# Patient Record
Sex: Male | Born: 1945 | Race: White | Hispanic: No | Marital: Married | Smoking: Former smoker
Health system: Southern US, Community
[De-identification: ages and names within clinical notes are randomized; demographics above are authoritative.]

## PROBLEM LIST (undated history)

## (undated) DIAGNOSIS — C61 Malignant neoplasm of prostate: Secondary | ICD-10-CM

## (undated) DIAGNOSIS — K5730 Diverticulosis of large intestine without perforation or abscess without bleeding: Secondary | ICD-10-CM

## (undated) DIAGNOSIS — Z860100 Personal history of colon polyps, unspecified: Secondary | ICD-10-CM

## (undated) HISTORY — DX: Personal history of colon polyps, unspecified: Z86.0100

## (undated) HISTORY — DX: Diverticulosis of large intestine without perforation or abscess without bleeding: K57.30

## (undated) HISTORY — PX: PORTACATH PLACEMENT: SHX2246

## (undated) HISTORY — DX: Personal history of colonic polyps: Z86.010

---

## 1994-03-26 HISTORY — PX: CARDIOVASCULAR STRESS TEST: SHX262

## 2000-09-23 ENCOUNTER — Encounter (INDEPENDENT_AMBULATORY_CARE_PROVIDER_SITE_OTHER): Payer: Self-pay | Admitting: Specialist

## 2000-09-23 ENCOUNTER — Other Ambulatory Visit: Admission: RE | Admit: 2000-09-23 | Discharge: 2000-09-23 | Payer: Self-pay | Admitting: Gastroenterology

## 2005-04-18 ENCOUNTER — Ambulatory Visit: Payer: Self-pay | Admitting: Internal Medicine

## 2005-08-13 ENCOUNTER — Ambulatory Visit: Payer: Self-pay | Admitting: Gastroenterology

## 2005-08-24 ENCOUNTER — Ambulatory Visit: Payer: Self-pay | Admitting: Gastroenterology

## 2005-08-24 LAB — HM COLONOSCOPY

## 2006-10-08 ENCOUNTER — Encounter: Payer: Self-pay | Admitting: Internal Medicine

## 2006-10-08 DIAGNOSIS — K5730 Diverticulosis of large intestine without perforation or abscess without bleeding: Secondary | ICD-10-CM | POA: Insufficient documentation

## 2006-10-11 ENCOUNTER — Ambulatory Visit: Payer: Self-pay | Admitting: Internal Medicine

## 2006-10-11 LAB — CONVERTED CEMR LAB
Glucose, Bld: 92 mg/dL (ref 70–99)
PSA: 2.5 ng/mL (ref 0.10–4.00)

## 2008-02-12 ENCOUNTER — Ambulatory Visit: Payer: Self-pay | Admitting: Internal Medicine

## 2008-02-13 LAB — CONVERTED CEMR LAB: PSA: 2.12 ng/mL (ref 0.10–4.00)

## 2009-05-06 ENCOUNTER — Ambulatory Visit: Payer: Self-pay | Admitting: Internal Medicine

## 2009-05-11 LAB — CONVERTED CEMR LAB
Cholesterol: 186 mg/dL (ref 0–200)
PSA: 2.27 ng/mL (ref 0.10–4.00)

## 2010-04-25 NOTE — Assessment & Plan Note (Signed)
Summary: CPX/CLE   Vital Signs:  Patient profile:   65 year old male Weight:      186 pounds BMI:     25.32 Temp:     97.9 degrees F oral Pulse rate:   72 / minute Pulse rhythm:   regular BP sitting:   120 / 68  (left arm) Cuff size:   large  Vitals Entered By: Mervin Hack CMA Duncan Dull) (May 06, 2009 8:18 AM) CC: adult physical   History of Present Illness: Doing well No new concerns  Does have the same low back pain if overdoes it--mostly yard work and weedeating [place] Gets stiff---going on the treadmill helps Stays in shape--strength training and aerobics. Has given up the running    Allergies: No Known Drug Allergies  Past History:  Past medical, surgical, family and social histories (including risk factors) reviewed for relevance to current acute and chronic problems.  Past Medical History: Reviewed history from 10/08/2006 and no changes required. Colonic polyps, hx of Diverticulosis, colon  Past Surgical History: Reviewed history from 10/08/2006 and no changes required. Stress Test- negative 1996  Family History: MI in 2 brothers and Dad 1 brother @45  Sister died of breast cancer Mom with HTN 1 sister with diabetes 2 matenal aunts died of cancer  Social History: Reviewed history from 10/08/2006 and no changes required. 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 Eyes:  Denies double vision and vision loss-1 eye. ENT:  Denies decreased hearing and ringing in ears; teeth okay--regular with dentist. CV:  Denies chest pain or discomfort, difficulty breathing at night, difficulty breathing while lying down, fainting, lightheadness, palpitations, and shortness of breath with exertion. Resp:  Denies cough and shortness of breath. GI:  Denies  abdominal pain, bloody stools, change in bowel habits, dark tarry stools, indigestion, nausea, and vomiting. GU:  Denies erectile dysfunction, urinary frequency, and urinary hesitancy. MS:  Complains of low back pain; denies joint pain and joint swelling. Derm:  Denies lesion(s) and rash; did have derm eval recently--nothing worrisome. Neuro:  Denies headaches, numbness, tingling, and weakness. Psych:  Denies anxiety and depression. Heme:  Denies abnormal bruising and enlarge lymph nodes. Allergy:  Denies seasonal allergies and sneezing.  Physical Exam  General:  alert and normal appearance.   Eyes:  pupils equal, pupils round, pupils reactive to light, and no optic disk abnormalities.   Ears:  R ear normal and L ear normal.   Mouth:  no erythema and no lesions.   Neck:  supple, no masses, no thyromegaly, no carotid bruits, and no cervical lymphadenopathy.   Lungs:  normal respiratory effort and normal breath sounds.   Heart:  normal rate, regular rhythm, no murmur, and no gallop.   Abdomen:  soft and non-tender.   Rectal:  no hemorrhoids and no masses.   Prostate:  no gland enlargement and no nodules.   Msk:  no joint tenderness and no joint swelling.   Pulses:  2+ in feet Extremities:  no edema Neurologic:  alert & oriented X3 and strength normal in all extremities.   Skin:  no rashes and no suspicious lesions.   Axillary Nodes:  No palpable lymphadenopathy Psych:  normally interactive, good eye contact, not anxious appearing, and not depressed appearing.     Impression & Recommendations:  Problem # 1:  HEALTH MAINTENANCE EXAM (ICD-V70.0) Assessment Comment Only  healthy counselled check PSA will screen with glucose and lipid  Orders: TLB-Lipid Panel (80061-LIPID) TLB-Glucose, QUANT (82947-GLU)  Complete Medication List: 1)  Aspirin 81 Mg Tbec (Aspirin) .... Take one by mouth daily 2)  Tylenol Pm Extra Strength 500-25 Mg Tabs (Diphenhydramine-apap (sleep)) .... As  needed  Other Orders: TLB-PSA (Prostate Specific Antigen) (84153-PSA) Venipuncture (16109)  Patient Instructions: 1)  Please schedule a follow-up appointment in 1 year.   Current Allergies (reviewed today): No known allergies    Tetanus/Td Vaccine    Vaccine Type: Tdap    Site: left deltoid    Mfr: GlaxoSmithKline    Dose: 0.5 ml    Route: IM    Given by: Mervin Hack CMA (AAMA)    Exp. Date: 05/21/2011    Lot #: [id number]    VIS given: 02/11/07 version given May 06, 2009.

## 2010-05-24 ENCOUNTER — Encounter: Payer: Self-pay | Admitting: Internal Medicine

## 2010-05-24 DIAGNOSIS — K5730 Diverticulosis of large intestine without perforation or abscess without bleeding: Secondary | ICD-10-CM | POA: Insufficient documentation

## 2010-05-24 DIAGNOSIS — Z86010 Personal history of colonic polyps: Secondary | ICD-10-CM

## 2010-07-14 ENCOUNTER — Ambulatory Visit (INDEPENDENT_AMBULATORY_CARE_PROVIDER_SITE_OTHER): Payer: PRIVATE HEALTH INSURANCE | Admitting: Internal Medicine

## 2010-07-14 ENCOUNTER — Encounter: Payer: Self-pay | Admitting: Internal Medicine

## 2010-07-14 VITALS — BP 128/70 | HR 62 | Temp 98.4°F | Ht 73.0 in | Wt 185.0 lb

## 2010-07-14 DIAGNOSIS — Z125 Encounter for screening for malignant neoplasm of prostate: Secondary | ICD-10-CM

## 2010-07-14 DIAGNOSIS — Z23 Encounter for immunization: Secondary | ICD-10-CM

## 2010-07-14 DIAGNOSIS — Z2911 Encounter for prophylactic immunotherapy for respiratory syncytial virus (RSV): Secondary | ICD-10-CM

## 2010-07-14 DIAGNOSIS — Z0000 Encounter for general adult medical examination without abnormal findings: Secondary | ICD-10-CM

## 2010-07-14 DIAGNOSIS — Z7189 Other specified counseling: Secondary | ICD-10-CM | POA: Insufficient documentation

## 2010-07-14 LAB — PSA: PSA: 3.06 ng/mL (ref 0.10–4.00)

## 2010-07-14 LAB — GLUCOSE, RANDOM: Glucose, Bld: 85 mg/dL (ref 70–99)

## 2010-07-14 NOTE — Progress Notes (Signed)
Addended by: Liane Comber on: 07/14/2010 10:48 AM   Modules accepted: Orders

## 2010-07-14 NOTE — Progress Notes (Signed)
Subjective:    Patient ID: [name], male    DOB: 05-12-1945, 65 y.o.   MRN: [id number]  HPI Doing well Still on same routine Occ stiff knees and back pain--esp with strenuous yard work Does some stretching in AM if stiff Generally doesn't need meds Mostly walks for exercise-- 3 miles (4-4.5MPH)  Current outpatient prescriptions:aspirin 81 MG tablet, Take 81 mg by mouth daily.  , Disp: , Rfl: ;  DISCONTD: diphenhydramine-acetaminophen (TYLENOL PM) 25-500 MG TABS, Take 1 tablet by mouth at bedtime as needed.  , Disp: , Rfl:   Past Medical History  Diagnosis Date  . Hx of colonic polyps   . Diverticulosis of colon     Past Surgical History  Procedure Date  . Cardiovascular stress test 1996    Negative    Family History  Problem Relation Age of Onset  . Heart attack Father   . Heart disease Father   . Heart attack Brother 45  . Heart disease Brother   . Heart attack Brother   . Heart disease Brother   . Cancer Sister     breast  . Hypertension Mother   . Diabetes Sister   . Cancer Maternal Aunt   . Cancer Maternal Aunt   . Asthma Son     History   Social History  . Marital Status: Married    Spouse Name: N/A    Number of Children: 3  . Years of Education: N/A   Occupational History  . cancer registry reporting    Social History Main Topics  . Smoking status: Former Smoker -- 1.0 packs/day for 10 years    Types: Cigarettes    Quit date: 03/26/1990  . Smokeless tobacco: Never Used  . Alcohol Use: Not on file  . Drug Use: Not on file  . Sexually Active: Not on file   Other Topics Concern  . Not on file   Social History Narrative  . No narrative on file   Review of Systems  Constitutional: Negative for fatigue and unexpected weight change.       Wears seat belt  HENT: Positive for congestion and rhinorrhea. Negative for hearing loss, dental problem and tinnitus.        Allergies not bad enough for meds Regular with dentist  Eyes: Negative for  visual disturbance.       No diplopia or sig vision loss  Respiratory: Negative for cough, chest tightness and shortness of breath.   Cardiovascular: Negative for chest pain, palpitations and leg swelling.  Gastrointestinal: Negative for nausea, vomiting, abdominal pain, constipation and blood in stool.       Rare heartburn--tums helps  Genitourinary: Negative for dysuria, urgency, decreased urine volume and difficulty urinating.       No sexual problems  Musculoskeletal: Positive for back pain and arthralgias. Negative for joint swelling.  Skin: Negative for rash.       No suspicious lesions--sees derm yearly  Neurological: Negative for dizziness, syncope, weakness, light-headedness and headaches.  Hematological: Negative for adenopathy. Does not bruise/bleed easily.  Psychiatric/Behavioral: Positive for sleep disturbance. Negative for dysphoric mood. The patient is not nervous/anxious.        Objective:   Physical Exam  Constitutional: He is oriented to person, place, and time. He appears well-developed and well-nourished. No distress.  HENT:  Head: Normocephalic and atraumatic.  Right Ear: External ear normal.  Left Ear: External ear normal.  Mouth/Throat: Oropharynx is clear and moist. No oropharyngeal exudate.  TMs normal  Eyes: Conjunctivae and EOM are normal. Pupils are equal, round, and reactive to light.       Fundi benign  Neck: Normal range of motion. Neck supple. No thyromegaly present.  Cardiovascular: Normal rate, regular rhythm, normal heart sounds and intact distal pulses.  Exam reveals no gallop.   No murmur heard. Pulmonary/Chest: Effort normal and breath sounds normal. No respiratory distress. He has no wheezes. He has no rales.  Abdominal: Soft. He exhibits no mass. There is no tenderness.  Musculoskeletal: Normal range of motion. He exhibits no edema and no tenderness.  Lymphadenopathy:    He has no cervical adenopathy.  Neurological: He is alert and  oriented to person, place, and time. He exhibits normal muscle tone.       No weakness  Skin: Skin is warm. No rash noted.       No lesions  Psychiatric: He has a normal mood and affect. His behavior is normal. Judgment and thought content normal.          Assessment & Plan:

## 2010-10-10 ENCOUNTER — Encounter: Payer: Self-pay | Admitting: Gastroenterology

## 2011-07-18 ENCOUNTER — Encounter: Payer: Self-pay | Admitting: Internal Medicine

## 2011-07-18 ENCOUNTER — Ambulatory Visit (INDEPENDENT_AMBULATORY_CARE_PROVIDER_SITE_OTHER): Payer: PRIVATE HEALTH INSURANCE | Admitting: Internal Medicine

## 2011-07-18 VITALS — BP 128/80 | HR 60 | Temp 98.6°F | Ht 72.0 in | Wt 184.0 lb

## 2011-07-18 DIAGNOSIS — Z0000 Encounter for general adult medical examination without abnormal findings: Secondary | ICD-10-CM

## 2011-07-18 LAB — PSA: PSA: 3.13 ng/mL (ref 0.10–4.00)

## 2011-07-18 LAB — GLUCOSE, RANDOM: Glucose, Bld: 90 mg/dL (ref 70–99)

## 2011-07-18 NOTE — Assessment & Plan Note (Signed)
Remains healthy Exercises regularly Will check glucose again and PSA (after discussion)

## 2011-07-18 NOTE — Progress Notes (Signed)
Subjective:    Patient ID: [name], male    DOB: 06/27/1945, 66 y.o.   MRN: [id number]  HPI Here for physical Feels good  Did have a lot of congestion in winter Ears were popping Used OTC allergy and sinus meds---not really helpful Better now Changes filters, new mattress and box springs No breathing problems Discussed options  Some normal aches and pains Busy with yard care and regular exercises (treadmill, strength training, etc)  Current Outpatient Prescriptions on File Prior to Visit  Medication Sig Dispense Refill  . aspirin 81 MG tablet Take 81 mg by mouth daily.          No Known Allergies  Past Medical History  Diagnosis Date  . Hx of colonic polyps   . Diverticulosis of colon     Past Surgical History  Procedure Date  . Cardiovascular stress test 1996    Negative    Family History  Problem Relation Age of Onset  . Heart attack Father   . Heart disease Father   . Heart attack Brother 45  . Heart disease Brother   . Heart attack Brother   . Heart disease Brother   . Cancer Sister     breast  . Hypertension Mother   . Diabetes Sister   . Cancer Maternal Aunt   . Cancer Maternal Aunt   . Asthma Son     History   Social History  . Marital Status: Married    Spouse Name: N/A    Number of Children: 3  . Years of Education: N/A   Occupational History  . cancer registry reporting    Social History Main Topics  . Smoking status: Former Smoker -- 1.0 packs/day for 10 years    Types: Cigarettes    Quit date: 03/26/1990  . Smokeless tobacco: Never Used  . Alcohol Use: Not on file  . Drug Use: Not on file  . Sexually Active: Not on file   Other Topics Concern  . Not on file   Social History Narrative  . No narrative on file   Review of Systems  Constitutional: Negative for fatigue and unexpected weight change.       Wears seat belt  HENT: Positive for congestion and tinnitus. Negative for hearing loss.        Did have slight  ringing with other symptoms in winter Regular with dentist  Eyes: Negative for visual disturbance.       No diplopia or unilateral vision loss  Respiratory: Negative for cough, chest tightness and shortness of breath.   Cardiovascular: Negative for chest pain, palpitations and leg swelling.  Gastrointestinal: Negative for nausea, vomiting, abdominal pain, constipation and blood in stool.       Only heartburn---rarely will use rolaids  Genitourinary: Negative for urgency, frequency and difficulty urinating.       Only occ nocturia No sexual problems  Musculoskeletal: Positive for back pain and arthralgias. Negative for joint swelling.       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 Loosens up best with walking on treadmill  Skin: Negative for rash.       No suspicious areas Does see dermatologist regularly  Neurological: Negative for dizziness, syncope, weakness, light-headedness, numbness and headaches.  Hematological: Negative for adenopathy. Does not bruise/bleed easily.  Psychiatric/Behavioral: Negative for sleep disturbance and dysphoric mood. The patient is not nervous/anxious.        Objective:   Physical  Exam  Constitutional: He is oriented to person, place, and time. He appears well-developed and well-nourished. No distress.  HENT:  Head: Normocephalic and atraumatic.  Right Ear: External ear normal.  Left Ear: External ear normal.  Mouth/Throat: Oropharynx is clear and moist. No oropharyngeal exudate.  Eyes: Conjunctivae and EOM are normal. Pupils are equal, round, and reactive to light.  Neck: Normal range of motion. Neck supple. No thyromegaly present.  Cardiovascular: Normal rate, regular rhythm, normal heart sounds and intact distal pulses.  Exam reveals no gallop.   No murmur heard. Pulmonary/Chest: Effort normal and breath sounds normal. No respiratory distress. He has no wheezes. He has no rales.  Abdominal: Soft. There is  no tenderness.  Musculoskeletal: Normal range of motion. He exhibits no edema and no tenderness.  Lymphadenopathy:    He has no cervical adenopathy.  Neurological: He is alert and oriented to person, place, and time.  Skin: No rash noted. No erythema.  Psychiatric: He has a normal mood and affect. His behavior is normal. Judgment and thought content normal.          Assessment & Plan:

## 2011-07-18 NOTE — Patient Instructions (Signed)
Please try a humidifier next winter. You can try cetirizine 10mg  daily or loratadine 10mg  1-2 daily as well. Call if you want to try medicated nasal spray.

## 2011-07-19 ENCOUNTER — Encounter: Payer: Self-pay | Admitting: *Deleted

## 2012-07-23 ENCOUNTER — Encounter: Payer: PRIVATE HEALTH INSURANCE | Admitting: Internal Medicine

## 2012-07-25 ENCOUNTER — Encounter: Payer: PRIVATE HEALTH INSURANCE | Admitting: Internal Medicine

## 2012-07-29 ENCOUNTER — Encounter: Payer: PRIVATE HEALTH INSURANCE | Admitting: Internal Medicine

## 2012-07-29 ENCOUNTER — Encounter: Payer: Self-pay | Admitting: Gastroenterology

## 2012-12-09 ENCOUNTER — Ambulatory Visit (INDEPENDENT_AMBULATORY_CARE_PROVIDER_SITE_OTHER): Payer: Medicare Other | Admitting: Internal Medicine

## 2012-12-09 ENCOUNTER — Encounter: Payer: Self-pay | Admitting: Internal Medicine

## 2012-12-09 VITALS — BP 140/88 | HR 68 | Temp 97.8°F | Ht 71.0 in | Wt 179.0 lb

## 2012-12-09 DIAGNOSIS — R6882 Decreased libido: Secondary | ICD-10-CM

## 2012-12-09 DIAGNOSIS — Z0000 Encounter for general adult medical examination without abnormal findings: Secondary | ICD-10-CM

## 2012-12-09 DIAGNOSIS — M549 Dorsalgia, unspecified: Secondary | ICD-10-CM

## 2012-12-09 DIAGNOSIS — Z23 Encounter for immunization: Secondary | ICD-10-CM

## 2012-12-09 LAB — CBC WITH DIFFERENTIAL/PLATELET
Basophils Relative: 0.4 % (ref 0.0–3.0)
Eosinophils Relative: 1.7 % (ref 0.0–5.0)
Lymphocytes Relative: 31 % (ref 12.0–46.0)
Neutrophils Relative %: 57.6 % (ref 43.0–77.0)
RBC: 5.55 Mil/uL (ref 4.22–5.81)
WBC: 4.7 10*3/uL (ref 4.5–10.5)

## 2012-12-09 LAB — BASIC METABOLIC PANEL
Calcium: 9.5 mg/dL (ref 8.4–10.5)
Creatinine, Ser: 1 mg/dL (ref 0.4–1.5)
GFR: 83.53 mL/min (ref >60.00)

## 2012-12-09 LAB — HEPATIC FUNCTION PANEL
ALT: 14 U/L (ref 0–53)
AST: 17 U/L (ref 0–37)
Bilirubin, Direct: 0.1 mg/dL (ref 0.0–0.3)
Total Bilirubin: 1 mg/dL (ref 0.3–1.2)

## 2012-12-09 NOTE — Assessment & Plan Note (Signed)
Healthy Will give pneumovax and flu Defer PSA to next year

## 2012-12-09 NOTE — Addendum Note (Signed)
Addended by: Sueanne Margarita on: 12/09/2012 11:03 AM   Modules accepted: Orders

## 2012-12-09 NOTE — Progress Notes (Signed)
Subjective:    Patient ID: [name], male    DOB: 1945-11-12, 67 y.o.   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 for 45 minutes (notes pain at end of this)  Has noted low sex drive Not a marital problem Some ED but able to finish  Current Outpatient Prescriptions on File Prior to Visit  Medication Sig Dispense Refill  . aspirin 81 MG tablet Take 81 mg by mouth daily.         No current facility-administered medications on file prior to visit.    No Known Allergies  Past Medical History  Diagnosis Date  . Hx of colonic polyps   . Diverticulosis of colon     Past Surgical History  Procedure Laterality Date  . Cardiovascular stress test  1996    Negative    Family History  Problem Relation Age of Onset  . Heart attack Father   . Heart disease Father   . Heart attack Brother 45  . Heart disease Brother   . Heart attack Brother   . Heart disease Brother   . Cancer Sister     breast  . Hypertension Mother   . Diabetes Sister   . Cancer Maternal Aunt   . Cancer Maternal Aunt   . Asthma Son     History   Social History  . Marital Status: Married    Spouse Name: N/A    Number of Children: 3  . Years of Education: N/A   Occupational History  . cancer registry reporting    Social History Main Topics  . Smoking status: Former Smoker -- 1.00 packs/day for 10 years    Types: Cigarettes    Quit date: 03/26/1990  . Smokeless tobacco: Never Used  . Alcohol Use: Not on file  . Drug Use: Not on file  . Sexual Activity: Not on file   Other Topics Concern  . Not on file   Social History Narrative   No living will   Requests wife as health care POA.   Would accept resuscitation    No tube feeds if cognitively unaware.   Review of Systems  Constitutional: Negative for fatigue and unexpected weight change.       Wears seat belt  HENT: Positive for congestion. Negative for hearing loss, rhinorrhea, dental problem and tinnitus.        Regular with dentist  Eyes: Negative for visual disturbance.       No diplopia or unilateral vision loss  Respiratory: Negative for cough, chest tightness and shortness of breath.   Cardiovascular: Negative for chest pain, palpitations and leg swelling.  Gastrointestinal: Negative for nausea, vomiting, abdominal pain, constipation and blood in stool.       No heartburn  Endocrine: Negative for cold intolerance and heat intolerance.  Genitourinary: Positive for frequency. Negative for urgency.       Occ nocturia Mild increased freq with coffee  Musculoskeletal: Positive for back pain. Negative for joint swelling and arthralgias.  Skin: Negative for rash.       Has seen Dr Lorita Officer rash that finally did clear  Allergic/Immunologic: Positive for environmental allergies. Negative for immunocompromised  state.       Spring/fall mild symptoms. Rarely uses OTC antihistamines  Neurological: Negative for dizziness, syncope, weakness, light-headedness, numbness and headaches.  Hematological: Negative for adenopathy. Does not bruise/bleed easily.  Psychiatric/Behavioral: Negative for sleep disturbance and dysphoric mood. The patient is not nervous/anxious.        Objective:   Physical Exam  Constitutional: He is oriented to person, place, and time. He appears well-developed and well-nourished. No distress.  HENT:  Head: Normocephalic and atraumatic.  Right Ear: External ear normal.  Left Ear: External ear normal.  Mouth/Throat: Oropharynx is clear and moist.  Eyes: Conjunctivae and EOM are normal. Pupils are equal, round, and reactive to light.  Neck:  Normal range of motion. Neck supple. No thyromegaly present.  Cardiovascular: Normal rate, regular rhythm, normal heart sounds and intact distal pulses.  Exam reveals no gallop.   No murmur heard. Pulmonary/Chest: Effort normal and breath sounds normal. No respiratory distress. He has no wheezes. He has no rales.  Abdominal: Soft. There is no tenderness.  Musculoskeletal: He exhibits no edema and no tenderness.  Very slight scoliosis  Lymphadenopathy:    He has no cervical adenopathy.  Neurological: He is alert and oriented to person, place, and time.  Skin: No rash noted. No erythema.  Psychiatric: He has a normal mood and affect. His behavior is normal.          Assessment & Plan:

## 2012-12-09 NOTE — Assessment & Plan Note (Signed)
Mild scoliosis Chiropractor may be slightly helpful Discussed core strengthening and fitness

## 2013-10-22 ENCOUNTER — Encounter: Payer: Self-pay | Admitting: Gastroenterology

## 2013-12-07 ENCOUNTER — Encounter: Payer: Self-pay | Admitting: Internal Medicine

## 2013-12-07 ENCOUNTER — Ambulatory Visit (INDEPENDENT_AMBULATORY_CARE_PROVIDER_SITE_OTHER): Payer: Medicare Other | Admitting: Internal Medicine

## 2013-12-07 VITALS — BP 148/80 | HR 73 | Temp 98.1°F | Wt 177.0 lb

## 2013-12-07 DIAGNOSIS — H8110 Benign paroxysmal vertigo, unspecified ear: Secondary | ICD-10-CM | POA: Insufficient documentation

## 2013-12-07 MED ORDER — MECLIZINE HCL 25 MG PO TABS: 25.0000 mg | ORAL_TABLET | Freq: Three times a day (TID) | ORAL | Status: DC | PRN

## 2013-12-07 NOTE — Progress Notes (Signed)
Pre visit review using our clinic review tool, if applicable. No additional management support is needed unless otherwise documented below in the visit note. 

## 2013-12-07 NOTE — Assessment & Plan Note (Signed)
No signs of CNS problem Discussed usual self limited nature of this--although if persistent can try Epley maneuver Will Rx meclizine for prn use

## 2013-12-07 NOTE — Progress Notes (Signed)
Subjective:    Patient ID: [name], male    DOB: Aug 07, 1945, 68 y.o.   MRN: [id number]  HPI Suddenly hit with spinning sensation 2 mornings ago Occurred when he turned over in bed Okay sitting still and improved Then worse again yesterday when he tried to do some work in yard--bending, etc Some nausea  No falls but has had to hold on at times Slight tone in ears--no loud ringing No change in hearing  No trouble walking No weakness No facial droop No speaking trouble No swallowing trouble  Did have spell about 10 years ago--seemed to resolve quickly  No recent travel No dietary changes or increased salt intake  Current Outpatient Prescriptions on File Prior to Visit  Medication Sig Dispense Refill  . aspirin 81 MG tablet Take 81 mg by mouth daily.         No current facility-administered medications on file prior to visit.    No Known Allergies  Past Medical History  Diagnosis Date  . Hx of colonic polyps   . Diverticulosis of colon     Past Surgical History  Procedure Laterality Date  . Cardiovascular stress test  1996    Negative    Family History  Problem Relation Age of Onset  . Heart attack Father   . Heart disease Father   . Heart attack Brother 20  . Heart disease Brother   . Heart attack Brother   . Heart disease Brother   . Cancer Sister     breast  . Hypertension Mother   . Diabetes Sister   . Cancer Maternal Aunt   . Cancer Maternal Aunt   . Asthma Son     History   Social History  . Marital Status: Married    Spouse Name: N/A    Number of Children: 3  . Years of Education: N/A   Occupational History  . cancer registry reporting    Social History Main Topics  . Smoking status: Former Smoker -- 1.00 packs/day for 10 years    Types: Cigarettes    Quit date: 03/26/1990  . Smokeless tobacco: Never Used  . Alcohol Use: Not on file  . Drug Use: Not on file  . Sexual Activity: Not on file   Other Topics Concern  . Not on  file   Social History Narrative   No living will   Requests wife as health care POA.   Would accept resuscitation    No tube feeds if cognitively unaware.   Review of Systems Has had some sinus pressure --maxillary and eyes felt tight No fever No cough and doesn't feel sick    Objective:   Physical Exam  Constitutional: He is oriented to person, place, and time. He appears well-developed and well-nourished. No distress.  HENT:  Mouth/Throat: Oropharynx is clear and moist. No oropharyngeal exudate.  Eyes: EOM are normal. Pupils are equal, round, and reactive to light.  No nystagmus  Neck: Normal range of motion. Neck supple. No thyromegaly present.  Cardiovascular: Normal rate, regular rhythm and normal heart sounds.  Exam reveals no gallop.   No murmur heard. Pulmonary/Chest: Effort normal and breath sounds normal. No respiratory distress. He has no wheezes. He has no rales.  Lymphadenopathy:    He has no cervical adenopathy.  Neurological: He is alert and oriented to person, place, and time. He has normal strength. He displays no tremor. No cranial nerve deficit. He exhibits normal muscle tone. He displays a negative  Romberg sign. Coordination and gait normal.          Assessment & Plan:

## 2013-12-07 NOTE — Patient Instructions (Signed)

## 2013-12-15 ENCOUNTER — Ambulatory Visit (INDEPENDENT_AMBULATORY_CARE_PROVIDER_SITE_OTHER): Payer: Medicare Other | Admitting: Internal Medicine

## 2013-12-15 ENCOUNTER — Encounter: Payer: Self-pay | Admitting: Internal Medicine

## 2013-12-15 VITALS — BP 152/75 | HR 66 | Temp 98.3°F | Ht 71.0 in | Wt 181.0 lb

## 2013-12-15 DIAGNOSIS — Z23 Encounter for immunization: Secondary | ICD-10-CM

## 2013-12-15 DIAGNOSIS — Z125 Encounter for screening for malignant neoplasm of prostate: Secondary | ICD-10-CM

## 2013-12-15 DIAGNOSIS — Z0000 Encounter for general adult medical examination without abnormal findings: Secondary | ICD-10-CM

## 2013-12-15 LAB — GLUCOSE, RANDOM: Glucose, Bld: 99 mg/dL (ref 70–99)

## 2013-12-15 LAB — PSA

## 2013-12-15 NOTE — Addendum Note (Signed)
Addended by: Despina Hidden on: 12/15/2013 09:48 AM   Modules accepted: Orders

## 2013-12-15 NOTE — Assessment & Plan Note (Signed)
Healthy  prevnar and flu Counseling done Will check PSA after discussion Glucose only otherwise

## 2013-12-15 NOTE — Progress Notes (Signed)
Subjective:    Patient ID: [name], male    DOB: 1945-08-15, 68 y.o.   MRN: [id number]  HPI Here for physical Reviewed Wellness form and advanced directives No falls No depression or anhedonia Cognition is fine  Still with the vertigo The meclizine does help--but has not been able to wean yet  Still with some back pina Clearly better now Stays active--does regular exercise (trying to get back into it)  Current Outpatient Prescriptions on File Prior to Visit  Medication Sig Dispense Refill  . aspirin 81 MG tablet Take 81 mg by mouth daily.        . meclizine (ANTIVERT) 25 MG tablet Take 1 tablet (25 mg total) by mouth 3 (three) times daily as needed for dizziness.  90 tablet  1   No current facility-administered medications on file prior to visit.    No Known Allergies  Past Medical History  Diagnosis Date  . Hx of colonic polyps   . Diverticulosis of colon     Past Surgical History  Procedure Laterality Date  . Cardiovascular stress test  1996    Negative    Family History  Problem Relation Age of Onset  . Heart attack Father   . Heart disease Father   . Heart attack Brother 77  . Heart disease Brother   . Heart attack Brother   . Heart disease Brother   . Cancer Sister     breast  . Hypertension Mother   . Diabetes Sister   . Cancer Maternal Aunt   . Cancer Maternal Aunt   . Asthma Son     History   Social History  . Marital Status: Married    Spouse Name: N/A    Number of Children: 3  . Years of Education: N/A   Occupational History  . cancer registry reporting    Social History Main Topics  . Smoking status: Former Smoker -- 1.00 packs/day for 10 years    Types: Cigarettes    Quit date: 03/26/1990  . Smokeless tobacco: Never Used  . Alcohol Use: Not on file  . Drug Use: Not on file  . Sexual Activity: Not on file   Other Topics Concern  . Not on file   Social History Narrative   No living will   Requests wife as health care  POA.   Would accept resuscitation    No tube feeds if cognitively unaware.   Review of Systems  Constitutional: Negative for fatigue and unexpected weight change.       Wears seat belt  HENT: Positive for tinnitus. Negative for hearing loss.        Slight tone in ears Regular with dentist  Eyes: Negative for visual disturbance.       No diplopia or unilateral vision loss  Respiratory: Negative for cough, chest tightness and shortness of breath.   Cardiovascular: Negative for chest pain, palpitations and leg swelling.  Gastrointestinal: Negative for nausea, vomiting, abdominal pain, constipation and blood in stool.       No heartburn  Endocrine: Positive for polydipsia. Negative for polyuria.       Drinks a lot of water  Genitourinary: Positive for difficulty urinating. Negative for urgency.       Somewhat slow flow Still ED--not sure he wants meds  Musculoskeletal: Positive for back pain. Negative for arthralgias and joint swelling.  Skin: Negative for rash.       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 Negative for immunocompromised state.       Mostly spring  Neurological: Positive for dizziness. Negative for syncope, weakness, light-headedness, numbness and headaches.  Hematological: Negative for adenopathy. Does not bruise/bleed easily.  Psychiatric/Behavioral: Negative for sleep disturbance and dysphoric mood. The patient is not nervous/anxious.        Sleeping on back with the vertigo       Objective:   Physical Exam  Constitutional: He is oriented to person, place, and time. He appears well-developed and well-nourished. No distress.  HENT:  Head: Normocephalic and atraumatic.  Right Ear: External ear normal.  Left Ear: External ear normal.  Mouth/Throat: Oropharynx is clear and moist. No oropharyngeal exudate.  Eyes: Conjunctivae and EOM are normal. Pupils are equal, round, and reactive to light.    Neck: Normal range of motion. Neck supple. No thyromegaly present.  Cardiovascular: Normal rate, regular rhythm, normal heart sounds and intact distal pulses.  Exam reveals no gallop.   No murmur heard. Pulmonary/Chest: Effort normal and breath sounds normal. No respiratory distress. He has no wheezes. He has no rales.  Abdominal: Soft. He exhibits no distension. There is no tenderness. There is no rebound and no guarding.  Musculoskeletal: He exhibits no edema and no tenderness.  Lymphadenopathy:    He has no cervical adenopathy.  Neurological: He is alert and oriented to person, place, and time.  Skin: No rash noted. No erythema.  Psychiatric: He has a normal mood and affect. His behavior is normal.          Assessment & Plan:

## 2013-12-15 NOTE — Progress Notes (Signed)
Pre visit review using our clinic review tool, if applicable. No additional management support is needed unless otherwise documented below in the visit note. 

## 2013-12-16 ENCOUNTER — Other Ambulatory Visit: Payer: Self-pay | Admitting: Internal Medicine

## 2013-12-16 DIAGNOSIS — R972 Elevated prostate specific antigen [PSA]: Secondary | ICD-10-CM

## 2014-01-27 ENCOUNTER — Other Ambulatory Visit (HOSPITAL_COMMUNITY): Payer: Self-pay | Admitting: Urology

## 2014-01-27 DIAGNOSIS — C61 Malignant neoplasm of prostate: Secondary | ICD-10-CM

## 2014-02-05 ENCOUNTER — Encounter (HOSPITAL_COMMUNITY)
Admission: RE | Admit: 2014-02-05 | Discharge: 2014-02-05 | Disposition: A | Discharge status: Home / Self Care | Payer: Medicare Other | Source: Ambulatory Visit | Attending: Urology | Admitting: Urology

## 2014-02-05 ENCOUNTER — Encounter (HOSPITAL_COMMUNITY): Payer: Self-pay

## 2014-02-05 DIAGNOSIS — C61 Malignant neoplasm of prostate: Secondary | ICD-10-CM | POA: Insufficient documentation

## 2014-02-05 HISTORY — DX: Malignant neoplasm of prostate: C61

## 2014-02-05 MED ORDER — TECHNETIUM TC 99M MEDRONATE IV KIT
24.9000 | PACK | Freq: Once | INTRAVENOUS | Status: AC | PRN
  Administered 2014-02-05: 24.9 via INTRAVENOUS

## 2014-02-08 ENCOUNTER — Telehealth: Payer: Self-pay | Admitting: Oncology

## 2014-02-08 NOTE — Telephone Encounter (Signed)
LEFT MESSAGE FOR PATIENT TO RETURN CALL TO SCHEDULE NP APPT.  °

## 2014-02-09 ENCOUNTER — Telehealth: Payer: Self-pay | Admitting: Oncology

## 2014-02-09 NOTE — Telephone Encounter (Signed)
S/W PATIENT AND GAVE NP APPT FOR 12/01 @ 10:30 W/DR. SHADAD.  REFERRING DR. [place] DX- PROSTATE CA [place] RECURRENT RISK WELCOME PACKET MAILED.

## 2014-02-16 ENCOUNTER — Telehealth: Payer: Self-pay | Admitting: Oncology

## 2014-02-16 NOTE — Telephone Encounter (Signed)
C/D ON 11/24 FOR NP APPT ON 12/01

## 2014-02-23 ENCOUNTER — Encounter: Payer: Self-pay | Admitting: Oncology

## 2014-02-23 ENCOUNTER — Other Ambulatory Visit: Payer: Medicare Other

## 2014-02-23 ENCOUNTER — Ambulatory Visit (HOSPITAL_BASED_OUTPATIENT_CLINIC_OR_DEPARTMENT_OTHER): Payer: Medicare Other | Admitting: Oncology

## 2014-02-23 ENCOUNTER — Ambulatory Visit: Payer: Medicare Other

## 2014-02-23 VITALS — BP 167/80 | HR 77 | Temp 97.1°F | Resp 18 | Ht 73.0 in | Wt 178.6 lb

## 2014-02-23 DIAGNOSIS — R591 Generalized enlarged lymph nodes: Secondary | ICD-10-CM

## 2014-02-23 DIAGNOSIS — C61 Malignant neoplasm of prostate: Secondary | ICD-10-CM

## 2014-02-23 NOTE — Consult Note (Signed)
Reason for Referral: Prostate cancer.   HPI: This is a pleasant 68 year old gentleman native of [place] where he lived the majority of his life. He is a rather healthy gentleman other than occasional history of vertigo and history of ear surgery. He was found to have an elevated PSA on annual screening labs and routine physical examination in September 2015. His PSA was 116. He was referred to Dr. [name] and on digital rectal examination he was found to have a firm right nodule. He underwent a prostate biopsy on 01/19/2014 which showed high volume disease including Gleason score 4+5 = 9 at the base of the prostate. He had 11 out of 12 cores involved with malignancy. Staging workup including a bone scan on 02/05/2014 was unremarkable for any metastatic disease. His abdominal imaging however showed enlarged periaortic and retroperitoneal lymphadenopathy ranging between 15 and 12 mm. He has also enlarged iliac lymph nodes measuring between 12 to 18 millimeters. Patient referred to me for evaluation regarding these issues.  Clinically, he is asymptomatic at this point. He does not report any headaches, blurry vision or syncope. He does not report any seizures or any neurological symptoms. He does not report any fevers, chills, weight loss, appetite changes or decline his energy. He does not report any chest pain, palpitation, orthopnea or leg edema. He does not report any cough, hemoptysis or hematemesis. He does not report any nausea, vomiting, abdominal pain, constipation or diarrhea. He does not report any frequency, urgency, hesitancy or nocturia. He does not report any incontinence or hematuria. He does not report any skeletal complaints of arthralgias myalgias. He does not report any early satiety or abdominal distention. Rest of his review of systems unremarkable.   Past Medical History  Diagnosis Date  . Hx of colonic polyps   . Diverticulosis of colon   . Prostate cancer    :  Past Surgical History  Procedure Laterality Date  . Cardiovascular stress test  1996    Negative  :  Current outpatient prescriptions: aspirin 81 MG tablet, Take 81 mg by mouth daily., Disp: , Rfl: ;  diphenhydramine-acetaminophen (TYLENOL PM) 25-500 MG TABS, Take 1 tablet by mouth at bedtime as needed., Disp: , Rfl: ;  meclizine (ANTIVERT) 25 MG tablet, Take 25 mg by mouth 3 (three) times daily., Disp: , Rfl: 1:  No Known Allergies:  Family History  Problem Relation Age of Onset  . Heart attack Father   . Heart disease Father   . Heart attack Brother 75  . Heart disease Brother   . Heart attack Brother   . Heart disease Brother   . Cancer Sister     breast  . Hypertension Mother   . Diabetes Sister   . Cancer Maternal Aunt   . Cancer Maternal Aunt   . Asthma Son   :  History   Social History  . Marital Status: Married    Spouse Name: N/A    Number of Children: 3  . Years of Education: N/A   Occupational History  . cancer registry reporting    Social History Main Topics  . Smoking status: Former Smoker -- 1.00 packs/day for 10 years    Types: Cigarettes    Quit date: 03/26/1990  . Smokeless tobacco: Never Used  . Alcohol Use: Not on file  . Drug Use: Not on file  . Sexual Activity: Not on file   Other Topics Concern  . Not on file   Social History Narrative  No living will   Requests wife as health care POA.   Would accept resuscitation    No tube feeds if cognitively unaware.  :  Pertinent items are noted in HPI.  Exam: ECOG 0 Blood pressure 167/80, pulse 77, temperature 97.1 F (36.2 C), temperature source Oral, resp. rate 18, height 6\' 1"  (1.854 m), weight 178 lb 9.6 oz (81.012 kg). General appearance: alert and cooperative Throat: lips, mucosa, and tongue normal; teeth and gums normal Neck: no adenopathy Back: negative Resp: clear to auscultation bilaterally. No rhonchi wheezes or dullness to percussion. Chest wall: no tenderness Cardio:  regular rate and rhythm, S1, S2 normal, no murmur, click, rub or gallop GI: soft, non-tender; bowel sounds normal; no masses,  no organomegaly Extremities: extremities normal, atraumatic, no cyanosis or edema Pulses: 2+ and symmetric Skin: Skin color, texture, turgor normal. No rashes or lesions Lymph nodes: Cervical, supraclavicular, and axillary nodes normal.  CBC    Component Value Date/Time   WBC 4.7 12/09/2012 0940   RBC 5.55 12/09/2012 0940   HGB 16.3 12/09/2012 0940   HCT 47.6 12/09/2012 0940   PLT 193.0 12/09/2012 0940   MCV 85.8 12/09/2012 0940   MCHC 34.1 12/09/2012 0940   RDW 13.4 12/09/2012 0940   LYMPHSABS 1.4 12/09/2012 0940   MONOABS 0.4 12/09/2012 0940   EOSABS 0.1 12/09/2012 0940   BASOSABS 0.0 12/09/2012 0940    Cr. 1.1 on 02/05/2014.     Nm Bone Scan Whole Body  02/05/2014   CLINICAL DATA:  Lumbar spine pain, no trauma, elevated PSA 116.61, history of prostate cancer  EXAM: NUCLEAR MEDICINE WHOLE BODY BONE SCAN  TECHNIQUE: Whole body anterior and posterior images were obtained approximately 3 hours after intravenous injection of radiopharmaceutical.  RADIOPHARMACEUTICALS:  24.9 mCi Technetium-99 MDP  COMPARISON:  CT abdomen/ pelvis 02/05/2014  FINDINGS: Symmetric uptake at the bilateral shoulders, knees, and first carpometacarpal joint region is compatible with degenerative change. There is focal uptake at the region of the approximate L2 level with mild S shaped thoracolumbar scoliosis. At review of the dissimilar exam performed today and dictated separately, this likely corresponds to extensive disc degenerative change at this level. Normal renal uptake of radiotracer is identified.  IMPRESSION: Probable degenerative change corresponding to areas of increased radiotracer uptake as above. No definite scintigraphic evidence for osseous metastatic disease.   Electronically Signed   By: Conchita Paris M.D.   On: 02/05/2014 14:12    Assessment and Plan:    68 year old gentleman with the following issues:  1. Advanced prostate cancer presenting with stage IV disease. His Gleason score is 4+5 = 9 with heavy volume disease involving 11 out of 12 cores of his biopsy obtained in October 2015. His PSA is 116 and his staging workup revealed lymphadenopathy in the periaortic and iliac chains. The natural course of this disease was discussed extensively with the patient and his wife. He understands that any treatments moving forward would be palliative and would not be curative at this time. However, given his relative health and excellent performance status he would be a candidate for any aggressive therapy available.  Treatment options were discussed today including androgen deprivation, systemic chemotherapy in combination of the above. The rationale for androgen deprivation was discussed today including testosterone deprivation and subsequently controlling the disease and lowering the PSA. This maneuver is rather successful and will provide disease control for possibly months to come. Complications include fatigue, weight gain, hot flashes, osteoporosis, decline in his libido among others were  discussed as a complication of this maneuver. He understands that this could be achieved by Lupron injection or surgical orchiectomy.  The rationale of using systemic chemotherapy was discussed extensively. I explained to him the data supporting the use of Taxotere chemotherapy in the hormone sensitive prostate cancer setting. Benefits include extending survival close to double digit months especially in the setting of an advanced aggressive disease with bulky adenopathy. Complication of Taxotere chemotherapy was discussed including nausea, vomiting, myelosuppression, neutropenia, neutropenic sepsis among others. He will require any chemotherapy education class which will be arranged for before the start of chemotherapy. A total of 6 cycles would be agreeable of treatment  at this time.  He would like to think about these options and will let me know in the near future.  2. IV access: If he decides to proceed with chemotherapy he will need this inserted prior. Complications discussed today that include bleeding, thrombosis and infection.  3. Antiemetics: These will be prescribed prior to start of chemotherapy.  4. Androgen depravation: I recommended starting that in any case in the near future and I will communicate with Dr. [name] see if he was to start that in his office or he would defer that to [place].  All questions were answered today to their satisfaction.

## 2014-02-23 NOTE — Progress Notes (Signed)
Checked in new pt with no financial concerns at this time. ° °

## 2014-02-23 NOTE — Progress Notes (Signed)
Please see consult note.  

## 2014-04-12 DIAGNOSIS — C775 Secondary and unspecified malignant neoplasm of intrapelvic lymph nodes: Secondary | ICD-10-CM | POA: Diagnosis not present

## 2014-04-12 DIAGNOSIS — R399 Unspecified symptoms and signs involving the genitourinary system: Secondary | ICD-10-CM | POA: Diagnosis not present

## 2014-04-12 DIAGNOSIS — C61 Malignant neoplasm of prostate: Secondary | ICD-10-CM | POA: Diagnosis not present

## 2014-04-13 ENCOUNTER — Telehealth: Payer: Self-pay | Admitting: *Deleted

## 2014-04-13 ENCOUNTER — Encounter: Payer: Self-pay | Admitting: *Deleted

## 2014-04-13 ENCOUNTER — Other Ambulatory Visit: Payer: Self-pay | Admitting: Oncology

## 2014-04-13 DIAGNOSIS — C61 Malignant neoplasm of prostate: Secondary | ICD-10-CM

## 2014-04-13 MED ORDER — LIDOCAINE-PRILOCAINE 2.5-2.5 % EX CREA: 1.0000 "application " | TOPICAL_CREAM | CUTANEOUS | Status: DC | PRN

## 2014-04-13 MED ORDER — ONDANSETRON HCL 8 MG PO TABS: 8.0000 mg | ORAL_TABLET | Freq: Three times a day (TID) | ORAL | Status: DC | PRN

## 2014-04-13 NOTE — Progress Notes (Signed)
Spoke with pharmacist @[facility]  [place]. zofran script will need to be pre-authorized. They will fax to our office, for managed care.

## 2014-04-13 NOTE — Progress Notes (Signed)
I returned the patient's call today and discussed his decision. He would like to proceed with systemic chemotherapy. He is planning to receive androgen deprivation in the near future under the care of Dr. [name]. I discussed with him the logistics of chemotherapy again and the logistics of a Port-A-Cath insertion. He is agreeable to proceed and we will set that up for him in the near future.  We will set up a chemotherapy education class and I have prescribed for him antiemetics as well as a anesthetic cream.

## 2014-04-13 NOTE — Telephone Encounter (Signed)
Pt called to [facility] stating he needs to speak with Dr [name] post seeing Dr [name].  " I need to talk to him about scheduling some procedures "  Return call for pt given as [phone].  THIS NOTE WILL BE SENT TO MD AND NURSE AT DESK FOR FOLLOW UP AND FURTHER COMMUNICATION WITH PT.

## 2014-04-14 ENCOUNTER — Telehealth: Payer: Self-pay | Admitting: *Deleted

## 2014-04-14 NOTE — Telephone Encounter (Signed)
Per staff message and POF I have scheduled appts. Advised scheduler of appts and that MD visit on 2/10 needs to be moved JMW

## 2014-04-15 ENCOUNTER — Encounter: Payer: Self-pay | Admitting: Oncology

## 2014-04-15 NOTE — Progress Notes (Signed)
Called [id number] [email or web address] was approved from 04/15/14-04/15/15

## 2014-04-20 ENCOUNTER — Encounter: Payer: Self-pay | Admitting: *Deleted

## 2014-04-20 ENCOUNTER — Other Ambulatory Visit: Payer: Medicare Other

## 2014-04-26 ENCOUNTER — Telehealth: Payer: Self-pay | Admitting: Oncology

## 2014-04-26 ENCOUNTER — Telehealth: Payer: Self-pay | Admitting: *Deleted

## 2014-04-26 NOTE — Telephone Encounter (Signed)
Per staff message and POF I have scheduled appts. Advised scheduler of appts. JMW  

## 2014-04-26 NOTE — Telephone Encounter (Signed)
Left message to confirm appointments for 02/10.

## 2014-04-27 DIAGNOSIS — C61 Malignant neoplasm of prostate: Secondary | ICD-10-CM | POA: Diagnosis not present

## 2014-04-28 ENCOUNTER — Other Ambulatory Visit: Payer: Self-pay | Admitting: Radiology

## 2014-04-28 ENCOUNTER — Telehealth: Payer: Self-pay | Admitting: Oncology

## 2014-04-28 NOTE — Telephone Encounter (Signed)
Mailed Calendar with revised appointments

## 2014-04-30 ENCOUNTER — Ambulatory Visit (HOSPITAL_COMMUNITY)
Admission: RE | Admit: 2014-04-30 | Discharge: 2014-04-30 | Disposition: A | Discharge status: Home / Self Care | Payer: Medicare Other | Source: Ambulatory Visit | Attending: Oncology | Admitting: Oncology

## 2014-04-30 ENCOUNTER — Other Ambulatory Visit: Payer: Self-pay | Admitting: Oncology

## 2014-04-30 ENCOUNTER — Encounter (HOSPITAL_COMMUNITY): Payer: Self-pay

## 2014-04-30 DIAGNOSIS — K5730 Diverticulosis of large intestine without perforation or abscess without bleeding: Secondary | ICD-10-CM | POA: Insufficient documentation

## 2014-04-30 DIAGNOSIS — C61 Malignant neoplasm of prostate: Secondary | ICD-10-CM | POA: Diagnosis not present

## 2014-04-30 DIAGNOSIS — Z87891 Personal history of nicotine dependence: Secondary | ICD-10-CM | POA: Insufficient documentation

## 2014-04-30 DIAGNOSIS — Z7982 Long term (current) use of aspirin: Secondary | ICD-10-CM | POA: Diagnosis not present

## 2014-04-30 DIAGNOSIS — Z79899 Other long term (current) drug therapy: Secondary | ICD-10-CM | POA: Insufficient documentation

## 2014-04-30 DIAGNOSIS — Z4542 Encounter for adjustment and management of neuropacemaker (brain) (peripheral nerve) (spinal cord): Secondary | ICD-10-CM | POA: Insufficient documentation

## 2014-04-30 LAB — BASIC METABOLIC PANEL
Anion gap: 7 (ref 5–15)
BUN: 20 mg/dL (ref 6–23)
CO2: 25 mmol/L (ref 19–32)
Calcium: 9.6 mg/dL (ref 8.4–10.5)
Chloride: 110 mmol/L (ref 96–112)
Creatinine, Ser: 0.92 mg/dL (ref 0.50–1.35)
GFR calc Af Amer: >90 mL/min (ref >90)
GFR, EST NON AFRICAN AMERICAN: 86 mL/min — AB (ref >90)
GLUCOSE: 98 mg/dL (ref 70–99)
Potassium: 3.8 mmol/L (ref 3.5–5.1)
Sodium: 142 mmol/L (ref 135–145)

## 2014-04-30 LAB — CBC
HCT: 45 % (ref 39.0–52.0)
Hemoglobin: 15.6 g/dL (ref 13.0–17.0)
MCH: 30 pg (ref 26.0–34.0)
MCHC: 34.7 g/dL (ref 30.0–36.0)
MCV: 86.5 fL (ref 78.0–100.0)
Platelets: 186 10*3/uL (ref 150–400)
RBC: 5.2 MIL/uL (ref 4.22–5.81)
RDW: 13.2 % (ref 11.5–15.5)
WBC: 3.7 10*3/uL — ABNORMAL LOW (ref 4.0–10.5)

## 2014-04-30 LAB — PROTIME-INR
INR: 1 (ref 0.00–1.49)
Prothrombin Time: 13.3 seconds (ref 11.6–15.2)

## 2014-04-30 LAB — APTT: aPTT: 28 seconds (ref 24–37)

## 2014-04-30 MED ORDER — HEPARIN SOD (PORK) LOCK FLUSH 100 UNIT/ML IV SOLN
INTRAVENOUS | Status: AC
  Filled 2014-04-30: qty 5

## 2014-04-30 MED ORDER — LIDOCAINE-EPINEPHRINE 2 %-1:100000 IJ SOLN
INTRAMUSCULAR | Status: AC
  Filled 2014-04-30: qty 1

## 2014-04-30 MED ORDER — SODIUM CHLORIDE 0.9 % IV SOLN
Freq: Once | INTRAVENOUS | Status: AC
  Administered 2014-04-30: 10:00:00 via INTRAVENOUS

## 2014-04-30 MED ORDER — MIDAZOLAM HCL 2 MG/2ML IJ SOLN
INTRAMUSCULAR | Status: AC | PRN
  Administered 2014-04-30: 1 mg via INTRAVENOUS
  Administered 2014-04-30: 0.5 mg via INTRAVENOUS

## 2014-04-30 MED ORDER — FENTANYL CITRATE 0.05 MG/ML IJ SOLN
INTRAMUSCULAR | Status: AC | PRN
  Administered 2014-04-30: 50 ug via INTRAVENOUS
  Administered 2014-04-30: 25 ug via INTRAVENOUS

## 2014-04-30 MED ORDER — CEFAZOLIN SODIUM-DEXTROSE 2-3 GM-% IV SOLR
INTRAVENOUS | Status: AC
  Administered 2014-04-30: 2 g via INTRAVENOUS
  Filled 2014-04-30: qty 50

## 2014-04-30 MED ORDER — CEFAZOLIN SODIUM-DEXTROSE 2-3 GM-% IV SOLR
2.0000 g | Freq: Once | INTRAVENOUS | Status: AC
  Administered 2014-04-30: 2 g via INTRAVENOUS

## 2014-04-30 MED ORDER — MIDAZOLAM HCL 2 MG/2ML IJ SOLN
INTRAMUSCULAR | Status: AC
  Filled 2014-04-30: qty 6

## 2014-04-30 MED ORDER — FENTANYL CITRATE 0.05 MG/ML IJ SOLN
INTRAMUSCULAR | Status: AC
  Filled 2014-04-30: qty 4

## 2014-04-30 NOTE — Discharge Instructions (Signed)
Conscious Sedation, Adult, Care After Refer to this sheet in the next few weeks. These instructions provide you with information on caring for yourself after your procedure. Your health care provider may also give you more specific instructions. Your treatment has been planned according to current medical practices, but problems sometimes occur. Call your health care provider if you have any problems or questions after your procedure. WHAT TO EXPECT AFTER THE PROCEDURE  After your procedure:  You may feel sleepy, clumsy, and have poor balance for several hours.  Vomiting may occur if you eat too soon after the procedure. HOME CARE INSTRUCTIONS  Do not participate in any activities where you could become injured for at least 24 hours. Do not:  Drive.  Swim.  Ride a bicycle.  Operate heavy machinery.  Cook.  Use power tools.  Climb ladders.  Work from a high place.  Do not make important decisions or sign legal documents until you are improved.  If you vomit, drink water, juice, or soup when you can drink without vomiting. Make sure you have little or no nausea before eating solid foods.  Only take over-the-counter or prescription medicines for pain, discomfort, or fever as directed by your health care provider.  Make sure you and your family fully understand everything about the medicines given to you, including what side effects may occur.  You should not drink alcohol, take sleeping pills, or take medicines that cause drowsiness for at least 24 hours.  If you smoke, do not smoke without supervision.  If you are feeling better, you may resume normal activities 24 hours after you were sedated.  Keep all appointments with your health care provider. SEEK MEDICAL CARE IF:  Your skin is pale or bluish in color.  You continue to feel nauseous or vomit.  Your pain is getting worse and is not helped by medicine.  You have bleeding or swelling.  You are still sleepy or  feeling clumsy after 24 hours. SEEK IMMEDIATE MEDICAL CARE IF:  You develop a rash.  You have difficulty breathing.  You develop any type of allergic problem.  You have a fever. MAKE SURE YOU:  Understand these instructions.  Will watch your condition.  Will get help right away if you are not doing well or get worse. Document Released: 12/31/2012 Document Reviewed: 12/31/2012 [facility] Patient Information 2015 [place], [place]. This information is not intended to replace advice given to you by your health care provider. Make sure you discuss any questions you have with your health care provider.  Implanted Port Insertion, Care After Refer to this sheet in the next few weeks. These instructions provide you with information on caring for yourself after your procedure. Your health care provider may also give you more specific instructions. Your treatment has been planned according to current medical practices, but problems sometimes occur. Call your health care provider if you have any problems or questions after your procedure. WHAT TO EXPECT AFTER THE PROCEDURE After your procedure, it is typical to have the following:   Discomfort at the port insertion site. Ice packs to the area will help.  Bruising on the skin over the port. This will subside in 3-4 days. HOME CARE INSTRUCTIONS  After your port is placed, you will get a manufacturer's information card. The card has information about your port. Keep this card with you at all times.   Know what kind of port you have. There are many types of ports available.   Wear a medical  alert bracelet in case of an emergency. This can help alert health care workers that you have a port.   The port can stay in for as long as your health care provider believes it is necessary.   A home health care nurse may give medicines and take care of the port.   You or a family member can get special training and directions for giving medicine and  taking care of the port at home.  SEEK MEDICAL CARE IF:   Your port does not flush or you are unable to get a blood return.   You have a fever or chills. SEEK IMMEDIATE MEDICAL CARE IF:  You have new fluid or pus coming from your incision.   You notice a bad smell coming from your incision site.   You have swelling, pain, or more redness at the incision or port site.   You have chest pain or shortness of breath. Document Released: 12/31/2012 Document Revised: 03/17/2013 Document Reviewed: 12/31/2012 [facility] Patient Information 2015 [place], [place]. This information is not intended to replace advice given to you by your health care provider. Make sure you discuss any questions you have with your health care provider.  May shower and remove dressing 24 to 48 hours after insertion. Keep dressing clean and dry.   Implanted [facility] Guide An implanted port is a type of central line that is placed under the skin. Central lines are used to provide IV access when treatment or nutrition needs to be given through a person's veins. Implanted ports are used for long-term IV access. An implanted port may be placed because:   You need IV medicine that would be irritating to the small veins in your hands or arms.   You need long-term IV medicines, such as antibiotics.   You need IV nutrition for a long period.   You need frequent blood draws for lab tests.   You need dialysis.  Implanted ports are usually placed in the chest area, but they can also be placed in the upper arm, the abdomen, or the leg. An implanted port has two main parts:   Reservoir. The reservoir is round and will appear as a small, raised area under your skin. The reservoir is the part where a needle is inserted to give medicines or draw blood.   Catheter. The catheter is a thin, flexible tube that extends from the reservoir. The catheter is placed into a large vein. Medicine that is inserted into the reservoir  goes into the catheter and then into the vein.  HOW WILL I CARE FOR MY INCISION SITE? Do not get the incision site wet. Bathe or shower as directed by your health care provider.  HOW IS MY PORT ACCESSED? Special steps must be taken to access the port:   Before the port is accessed, a numbing cream can be placed on the skin. This helps numb the skin over the port site.   Your health care provider uses a sterile technique to access the port.  Your health care provider must put on a mask and sterile gloves.  The skin over your port is cleaned carefully with an antiseptic and allowed to dry.  The port is gently pinched between sterile gloves, and a needle is inserted into the port.  Only "non-coring" port needles should be used to access the port. Once the port is accessed, a blood return should be checked. This helps ensure that the port is in the vein and is not  clogged.   If your port needs to remain accessed for a constant infusion, a clear (transparent) bandage will be placed over the needle site. The bandage and needle will need to be changed every week, or as directed by your health care provider.   Keep the bandage covering the needle clean and dry. Do not get it wet. Follow your health care provider's instructions on how to take a shower or bath while the port is accessed.   If your port does not need to stay accessed, no bandage is needed over the port.  WHAT IS FLUSHING? Flushing helps keep the port from getting clogged. Follow your health care provider's instructions on how and when to flush the port. Ports are usually flushed with saline solution or a medicine called heparin. The need for flushing will depend on how the port is used.   If the port is used for intermittent medicines or blood draws, the port will need to be flushed:   After medicines have been given.   After blood has been drawn.   As part of routine maintenance.   If a constant infusion is running,  the port may not need to be flushed.  HOW LONG WILL MY PORT STAY IMPLANTED? The port can stay in for as long as your health care provider thinks it is needed. When it is time for the port to come out, surgery will be done to remove it. The procedure is similar to the one performed when the port was put in.  WHEN SHOULD I SEEK IMMEDIATE MEDICAL CARE? When you have an implanted port, you should seek immediate medical care if:   You notice a bad smell coming from the incision site.   You have swelling, redness, or drainage at the incision site.   You have more swelling or pain at the port site or the surrounding area.   You have a fever that is not controlled with medicine. Document Released: 03/12/2005 Document Revised: 12/31/2012 Document Reviewed: 11/17/2012 [facility] Patient Information 2015 [place], [place]. This information is not intended to replace advice given to you by your health care provider. Make sure you discuss any questions you have with your health care provider.

## 2014-04-30 NOTE — H&P (Signed)
Chief Complaint: "I'm here for a port a cath"  Referring Physician(s): Shadad,Firas N  History of Present Illness: [name] is a 69 y.o. male with history of stage IV prostate carcinoma who presents today for Port-A-Cath placement for chemotherapy.  Past Medical History  Diagnosis Date  . Hx of colonic polyps   . Diverticulosis of colon   . Prostate cancer     Past Surgical History  Procedure Laterality Date  . Cardiovascular stress test  1996    Negative    Allergies: Review of patient's allergies indicates no known allergies.  Medications: Prior to Admission medications   Medication Sig Start Date End Date Taking? Authorizing Provider  aspirin 81 MG tablet Take 81 mg by mouth daily.   Yes Historical Provider, MD  diphenhydramine-acetaminophen (TYLENOL PM) 25-500 MG TABS Take 1 tablet by mouth at bedtime as needed (for pain/sleep).    Yes Historical Provider, MD  lidocaine-prilocaine (EMLA) cream Apply 1 application topically as needed. Apply to port on the day of chemotherapy. 04/13/14  Yes Wyatt Portela, MD  meclizine (ANTIVERT) 25 MG tablet Take 25 mg by mouth 3 (three) times daily. 12/07/13  Yes Historical Provider, MD  ondansetron (ZOFRAN) 8 MG tablet Take 1 tablet (8 mg total) by mouth every 8 (eight) hours as needed for nausea or vomiting. 04/13/14  Yes Wyatt Portela, MD    Family History  Problem Relation Age of Onset  . Heart attack Father   . Heart disease Father   . Heart attack Brother 84  . Heart disease Brother   . Heart attack Brother   . Heart disease Brother   . Cancer Sister     breast  . Hypertension Mother   . Diabetes Sister   . Cancer Maternal Aunt   . Cancer Maternal Aunt   . Asthma Son     History   Social History  . Marital Status: Married    Spouse Name: N/A    Number of Children: 3  . Years of Education: N/A   Occupational History  . cancer registry reporting    Social History Main Topics  . Smoking status: Former  Smoker -- 1.00 packs/day for 10 years    Types: Cigarettes    Quit date: 03/26/1990  . Smokeless tobacco: Never Used  . Alcohol Use: Not on file  . Drug Use: Not on file  . Sexual Activity: Not on file   Other Topics Concern  . Not on file   Social History Narrative   No living will   Requests wife as health care POA.   Would accept resuscitation    No tube feeds if cognitively unaware.      Review of Systems  Constitutional: Negative for fever, chills, fatigue and unexpected weight change.  Respiratory: Negative for cough and shortness of breath.   Cardiovascular: Negative for chest pain.  Gastrointestinal: Negative for nausea, vomiting, abdominal pain and blood in stool.  Genitourinary: Negative for dysuria and hematuria.  Musculoskeletal: Positive for back pain.  Neurological: Negative for headaches.  Hematological: Does not bruise/bleed easily.    Vital Signs: BP 135/68 mmHg  Pulse 58  Temp(Src) 98.2 F (36.8 C) (Oral)  Resp 20  Ht 6\' 2"  (1.88 m)  Wt 178 lb (80.74 kg)  BMI 22.84 kg/m2  SpO2 99%  Physical Exam  Constitutional: He is oriented to person, place, and time. He appears well-developed and well-nourished.  Cardiovascular: Normal rate and regular rhythm.  Pulmonary/Chest: Effort normal and breath sounds normal.  Abdominal: Soft. Bowel sounds are normal. There is no tenderness.  Musculoskeletal: Normal range of motion. He exhibits no edema.  Neurological: He is alert and oriented to person, place, and time.    Imaging: No results found.  Labs:  CBC:  Recent Labs  04/30/14 0945  WBC 3.7*  HGB 15.6  HCT 45.0  PLT 186    COAGS: No results for input(s): INR, APTT in the last 8760 hours.  BMP:  Recent Labs  12/15/13 0919  GLUCOSE 99    LIVER FUNCTION TESTS: No results for input(s): BILITOT, AST, ALT, ALKPHOS, PROT, ALBUMIN in the last 8760 hours.  TUMOR MARKERS: No results for input(s): AFPTM, CEA, CA199, CHROMGRNA in the last  8760 hours.  Assessment and Plan: [name] is a 69 y.o. male with history of stage IV prostate carcinoma who presents today for Port-A-Cath placement for chemotherapy. Details/risks of procedure discussed with patient/wife with their understanding and consent.   Signed: Autumn Messing 04/30/2014, 10:29 AM

## 2014-04-30 NOTE — Procedures (Signed)
Successful placement of right IJ approach port-a-cath with tip at the superior caval atrial junction. The catheter is ready for immediate use. No immediate post procedural complications. 

## 2014-05-05 ENCOUNTER — Ambulatory Visit: Payer: Self-pay | Admitting: Oncology

## 2014-05-05 ENCOUNTER — Other Ambulatory Visit: Payer: Self-pay

## 2014-05-05 ENCOUNTER — Telehealth: Payer: Self-pay | Admitting: *Deleted

## 2014-05-05 ENCOUNTER — Other Ambulatory Visit (HOSPITAL_BASED_OUTPATIENT_CLINIC_OR_DEPARTMENT_OTHER): Payer: Medicare Other

## 2014-05-05 ENCOUNTER — Ambulatory Visit (HOSPITAL_BASED_OUTPATIENT_CLINIC_OR_DEPARTMENT_OTHER): Payer: Medicare Other

## 2014-05-05 ENCOUNTER — Telehealth: Payer: Self-pay | Admitting: Oncology

## 2014-05-05 ENCOUNTER — Ambulatory Visit (HOSPITAL_BASED_OUTPATIENT_CLINIC_OR_DEPARTMENT_OTHER): Payer: Medicare Other | Admitting: Oncology

## 2014-05-05 VITALS — BP 153/72 | HR 62 | Resp 18 | Ht 74.0 in | Wt 176.6 lb

## 2014-05-05 DIAGNOSIS — C61 Malignant neoplasm of prostate: Secondary | ICD-10-CM | POA: Diagnosis not present

## 2014-05-05 DIAGNOSIS — E291 Testicular hypofunction: Secondary | ICD-10-CM | POA: Diagnosis not present

## 2014-05-05 DIAGNOSIS — C779 Secondary and unspecified malignant neoplasm of lymph node, unspecified: Secondary | ICD-10-CM

## 2014-05-05 DIAGNOSIS — Z5111 Encounter for antineoplastic chemotherapy: Secondary | ICD-10-CM | POA: Diagnosis not present

## 2014-05-05 LAB — CBC WITH DIFFERENTIAL/PLATELET
BASO%: 0.6 % (ref 0.0–2.0)
BASOS ABS: 0 10*3/uL (ref 0.0–0.1)
EOS%: 1.8 % (ref 0.0–7.0)
Eosinophils Absolute: 0.1 10*3/uL (ref 0.0–0.5)
HEMATOCRIT: 47.4 % (ref 38.4–49.9)
HEMOGLOBIN: 15.5 g/dL (ref 13.0–17.1)
LYMPH%: 28 % (ref 14.0–49.0)
MCH: 28.6 pg (ref 27.2–33.4)
MCHC: 32.7 g/dL (ref 32.0–36.0)
MCV: 87.5 fL (ref 79.3–98.0)
MONO#: 0.3 10*3/uL (ref 0.1–0.9)
MONO%: 7.3 % (ref 0.0–14.0)
NEUT#: 2.8 10*3/uL (ref 1.5–6.5)
NEUT%: 62.3 % (ref 39.0–75.0)
Platelets: 187 10*3/uL (ref 140–400)
RBC: 5.42 10*6/uL (ref 4.20–5.82)
RDW: 13.4 % (ref 11.0–14.6)
WBC: 4.5 10*3/uL (ref 4.0–10.3)
lymph#: 1.3 10*3/uL (ref 0.9–3.3)

## 2014-05-05 LAB — COMPREHENSIVE METABOLIC PANEL (CC13)
ALBUMIN: 4.4 g/dL (ref 3.5–5.0)
ALK PHOS: 93 U/L (ref 40–150)
ALT: 17 U/L (ref 0–55)
AST: 14 U/L (ref 5–34)
Anion Gap: 7 mEq/L (ref 3–11)
BILIRUBIN TOTAL: 0.6 mg/dL (ref 0.20–1.20)
BUN: 14.3 mg/dL (ref 7.0–26.0)
CO2: 26 mEq/L (ref 22–29)
Calcium: 10 mg/dL (ref 8.4–10.4)
Chloride: 110 mEq/L — ABNORMAL HIGH (ref 98–109)
Creatinine: 0.9 mg/dL (ref 0.7–1.3)
EGFR: >90 mL/min/{1.73_m2} (ref >90)
GLUCOSE: 94 mg/dL (ref 70–140)
Potassium: 4.2 mEq/L (ref 3.5–5.1)
Sodium: 143 mEq/L (ref 136–145)
Total Protein: 7.3 g/dL (ref 6.4–8.3)

## 2014-05-05 MED ORDER — DEXAMETHASONE SODIUM PHOSPHATE 10 MG/ML IJ SOLN
10.0000 mg | Freq: Once | INTRAMUSCULAR | Status: AC
  Administered 2014-05-05: 10 mg via INTRAVENOUS

## 2014-05-05 MED ORDER — DEXAMETHASONE SODIUM PHOSPHATE 10 MG/ML IJ SOLN
INTRAMUSCULAR | Status: AC
  Filled 2014-05-05: qty 1

## 2014-05-05 MED ORDER — HEPARIN SOD (PORK) LOCK FLUSH 100 UNIT/ML IV SOLN
500.0000 [IU] | Freq: Once | INTRAVENOUS | Status: AC | PRN
  Administered 2014-05-05: 500 [IU]
  Filled 2014-05-05: qty 5

## 2014-05-05 MED ORDER — SODIUM CHLORIDE 0.9 % IJ SOLN
10.0000 mL | INTRAMUSCULAR | Status: DC | PRN
  Administered 2014-05-05: 10 mL
  Filled 2014-05-05: qty 10

## 2014-05-05 MED ORDER — ONDANSETRON 8 MG/NS 50 ML IVPB
INTRAVENOUS | Status: AC
  Filled 2014-05-05: qty 8

## 2014-05-05 MED ORDER — ONDANSETRON 8 MG/50ML IVPB (CHCC)
8.0000 mg | Freq: Once | INTRAVENOUS | Status: AC
  Administered 2014-05-05: 8 mg via INTRAVENOUS

## 2014-05-05 MED ORDER — SODIUM CHLORIDE 0.9 % IV SOLN
Freq: Once | INTRAVENOUS | Status: AC
  Administered 2014-05-05: 10:00:00 via INTRAVENOUS

## 2014-05-05 MED ORDER — DOCETAXEL CHEMO INJECTION 160 MG/16ML
75.0000 mg/m2 | Freq: Once | INTRAVENOUS | Status: AC
  Administered 2014-05-05: 150 mg via INTRAVENOUS
  Filled 2014-05-05: qty 15

## 2014-05-05 NOTE — Patient Instructions (Addendum)
[facility] Discharge Instructions for Patients Receiving Chemotherapy  Today you received the following chemotherapy agents Taxotere  To help prevent nausea and vomiting after your treatment, we encourage you to take your nausea medication Zofran(Ondansetron) every 8 hours as needed. You may take your next dose at 7:00pm on 05/05/2014.   If you develop nausea and vomiting that is not controlled by your nausea medication, call the clinic.   BELOW ARE SYMPTOMS THAT SHOULD BE REPORTED IMMEDIATELY:  *FEVER GREATER THAN 100.5 F  *CHILLS WITH OR WITHOUT FEVER  NAUSEA AND VOMITING THAT IS NOT CONTROLLED WITH YOUR NAUSEA MEDICATION  *UNUSUAL SHORTNESS OF BREATH  *UNUSUAL BRUISING OR BLEEDING  TENDERNESS IN MOUTH AND THROAT WITH OR WITHOUT PRESENCE OF ULCERS  *URINARY PROBLEMS  *BOWEL PROBLEMS  UNUSUAL RASH Items with * indicate a potential emergency and should be followed up as soon as possible.  Feel free to call the clinic you have any questions or concerns. The clinic phone number is [phone].

## 2014-05-05 NOTE — Telephone Encounter (Signed)
Gave avs & calendar for February & March.Sent message to schedule

## 2014-05-05 NOTE — Progress Notes (Signed)
Hematology and Oncology Follow Up Visit  [name] [id number] [birth year] 69 y.o. 05/05/2014 10:19 AM   Principle Diagnosis: 69 year old gentleman with prostate cancer diagnosed in October 2015. He had a Gleason score 4+5 = 9 and a PSA of 116. Abdominal imaging showed bulky retroperitoneal lymphadenopathy. He does not have any bony involvement.   Prior Therapy: Status post prostate biopsy on 01/19/2014.  Current therapy:  Androgen deprivation under the care of Dr. [name]. Systemic chemotherapy in the form of Taxotere 75 mg/m every 3 weeks with plans for total of 6 cycles. Cycle 1 will be given on 05/05/2014.  Interim History:  [name] presents today for a follow-up visit. Since the last visit, he had a Port-A-Cath inserted and attended chemotherapy education class. He was also started on androgen deprivation under the care of Dr. [name] in addition to Casodex. He is ready to proceed with chemotherapy.  He does not report any headaches, blurry vision or syncope. He does not report any seizures or any neurological symptoms. He does not report any fevers, chills, weight loss, appetite changes or decline his energy. He does not report any chest pain, palpitation, orthopnea or leg edema. He does not report any cough, hemoptysis or hematemesis. He does not report any nausea, vomiting, abdominal pain, constipation or diarrhea. He does not report any frequency, urgency, hesitancy or nocturia. He does not report any incontinence or hematuria. He does not report any skeletal complaints of arthralgias myalgias. He does not report any early satiety or abdominal distention. Rest of his review of systems unremarkable.   Medications: I have reviewed the patient's current medications.  Current Outpatient Prescriptions  Medication Sig Dispense Refill  . aspirin 81 MG tablet Take 81 mg by mouth daily.    . diphenhydramine-acetaminophen (TYLENOL PM) 25-500 MG TABS Take 1 tablet by mouth at bedtime as needed  (for pain/sleep).     Marland Kitchen lidocaine-prilocaine (EMLA) cream Apply 1 application topically as needed. Apply to port on the day of chemotherapy. 30 g 0  . meclizine (ANTIVERT) 25 MG tablet Take 25 mg by mouth 3 (three) times daily.  1  . ondansetron (ZOFRAN) 8 MG tablet Take 1 tablet (8 mg total) by mouth every 8 (eight) hours as needed for nausea or vomiting. 20 tablet 0   No current facility-administered medications for this visit.     Allergies: No Known Allergies  Past Medical History, Surgical history, Social history, and Family History were reviewed and updated.   Physical Exam: Blood pressure 153/72, pulse 62, resp. rate 18, height 6\' 2"  (1.88 m), weight 176 lb 9.6 oz (80.105 kg), SpO2 100 %. ECOG: 0 General appearance: alert and cooperative Head: Normocephalic, without obvious abnormality Neck: no adenopathy Lymph nodes: Cervical, supraclavicular, and axillary nodes normal. Heart:regular rate and rhythm, S1, S2 normal, no murmur, click, rub or gallop Lung:chest clear, no wheezing, rales, normal symmetric air entry Abdomin: soft, non-tender, without masses or organomegaly EXT:no erythema, induration, or nodules   Lab Results: Lab Results  Component Value Date   WBC 4.5 05/05/2014   HGB 15.5 05/05/2014   HCT 47.4 05/05/2014   MCV 87.5 05/05/2014   PLT 187 05/05/2014     Chemistry      Component Value Date/Time   NA 143 05/05/2014 0917   NA 142 04/30/2014 0945   K 4.2 05/05/2014 0917   K 3.8 04/30/2014 0945   CL 110 04/30/2014 0945   CO2 26 05/05/2014 0917   CO2 25 04/30/2014 0945  BUN 14.3 05/05/2014 0917   BUN 20 04/30/2014 0945   CREATININE 0.9 05/05/2014 0917   CREATININE 0.92 04/30/2014 0945      Component Value Date/Time   CALCIUM 10.0 05/05/2014 0917   CALCIUM 9.6 04/30/2014 0945   ALKPHOS 93 05/05/2014 0917   ALKPHOS 54 12/09/2012 0940   AST 14 05/05/2014 0917   AST 17 12/09/2012 0940   ALT 17 05/05/2014 0917   ALT 14 12/09/2012 0940   BILITOT  0.60 05/05/2014 0917   BILITOT 1.0 12/09/2012 0940        Impression and Plan:  69 year old gentleman with the following issues:  1. Advanced prostate cancer presenting with stage IV disease. His Gleason score is 4+5 = 9 with heavy volume disease involving 11 out of 12 cores of his biopsy obtained in October 2015. His PSA is 116 and his staging workup revealed lymphadenopathy in the periaortic and iliac chains. He started androgen depravation August care of Dr. [name].  History of opted to proceed with systemic chemotherapy in addition to androgen deprivation. Risks associated with Taxotere chemotherapy were discussed again these include neuropathy, myelosuppression, weakness fatigue and tiredness. He is ready to proceed with cycle 1 for total of 6 cycles.  2. IV access: Port-A-Cath inserted without any complications. EMLA cream was given to the patient.  3. Antiemetics: He was prescribed Zofran prior to start chemotherapy.  4. Androgen depravation: This will continued under the care of Dr. [name].  5. Neutropenia prophylaxis: He will receive Neulasta after each injection of chemotherapy.  6. Follow-up: Will be in 3 weeks for cycle 2 of chemotherapy.   [facility], MD 2/10/201610:19 AM

## 2014-05-05 NOTE — Telephone Encounter (Signed)
Per staff message and POF I have scheduled appts. Advised scheduler of appts. JMW  

## 2014-05-06 ENCOUNTER — Telehealth: Payer: Self-pay | Admitting: *Deleted

## 2014-05-06 ENCOUNTER — Ambulatory Visit (HOSPITAL_BASED_OUTPATIENT_CLINIC_OR_DEPARTMENT_OTHER): Payer: Medicare Other

## 2014-05-06 ENCOUNTER — Telehealth: Payer: Self-pay

## 2014-05-06 DIAGNOSIS — C61 Malignant neoplasm of prostate: Secondary | ICD-10-CM

## 2014-05-06 DIAGNOSIS — Z5189 Encounter for other specified aftercare: Secondary | ICD-10-CM

## 2014-05-06 LAB — PSA: PSA: 145.3 ng/mL — ABNORMAL HIGH (ref <=4.00)

## 2014-05-06 MED ORDER — PEGFILGRASTIM INJECTION 6 MG/0.6ML ~~LOC~~
6.0000 mg | PREFILLED_SYRINGE | Freq: Once | SUBCUTANEOUS | Status: AC
  Administered 2014-05-06: 6 mg via SUBCUTANEOUS
  Filled 2014-05-06: qty 0.6

## 2014-05-06 NOTE — Patient Instructions (Signed)
Pegfilgrastim injection What is this medicine? PEGFILGRASTIM (peg fil GRA stim) is a long-acting granulocyte colony-stimulating factor that stimulates the growth of neutrophils, a type of white blood cell important in the body's fight against infection. It is used to reduce the incidence of fever and infection in patients with certain types of cancer who are receiving chemotherapy that affects the bone marrow. This medicine may be used for other purposes; ask your health care provider or pharmacist if you have questions. COMMON BRAND NAME(S): Neulasta What should I tell my health care provider before I take this medicine? They need to know if you have any of these conditions: -latex allergy -ongoing radiation therapy -sickle cell disease -skin reactions to acrylic adhesives (On-Body Injector only) -an unusual or allergic reaction to pegfilgrastim, filgrastim, other medicines, foods, dyes, or preservatives -pregnant or trying to get pregnant -breast-feeding How should I use this medicine? This medicine is for injection under the skin. If you get this medicine at home, you will be taught how to prepare and give the pre-filled syringe or how to use the On-body Injector. Refer to the patient Instructions for Use for detailed instructions. Use exactly as directed. Take your medicine at regular intervals. Do not take your medicine more often than directed. It is important that you put your used needles and syringes in a special sharps container. Do not put them in a trash can. If you do not have a sharps container, call your pharmacist or healthcare provider to get one. Talk to your pediatrician regarding the use of this medicine in children. Special care may be needed. Overdosage: If you think you have taken too much of this medicine contact a poison control center or emergency room at once. NOTE: This medicine is only for you. Do not share this medicine with others. What if I miss a dose? It is  important not to miss your dose. Call your doctor or health care professional if you miss your dose. If you miss a dose due to an On-body Injector failure or leakage, a new dose should be administered as soon as possible using a single prefilled syringe for manual use. What may interact with this medicine? Interactions have not been studied. Give your health care provider a list of all the medicines, herbs, non-prescription drugs, or dietary supplements you use. Also tell them if you smoke, drink alcohol, or use illegal drugs. Some items may interact with your medicine. This list may not describe all possible interactions. Give your health care provider a list of all the medicines, herbs, non-prescription drugs, or dietary supplements you use. Also tell them if you smoke, drink alcohol, or use illegal drugs. Some items may interact with your medicine. What should I watch for while using this medicine? You may need blood work done while you are taking this medicine. If you are going to need a MRI, CT scan, or other procedure, tell your doctor that you are using this medicine (On-Body Injector only). What side effects may I notice from receiving this medicine? Side effects that you should report to your doctor or health care professional as soon as possible: -allergic reactions like skin rash, itching or hives, swelling of the face, lips, or tongue -dizziness -fever -pain, redness, or irritation at site where injected -pinpoint red spots on the skin -shortness of breath or breathing problems -stomach or side pain, or pain at the shoulder -swelling -tiredness -trouble passing urine Side effects that usually do not require medical attention (report to your doctor   or health care professional if they continue or are bothersome): -bone pain -muscle pain This list may not describe all possible side effects. Call your doctor for medical advice about side effects. You may report side effects to FDA at  1-800-FDA-1088. Where should I keep my medicine? Keep out of the reach of children. Store pre-filled syringes in a refrigerator between 2 and 8 degrees C (36 and 46 degrees F). Do not freeze. Keep in carton to protect from light. Throw away this medicine if it is left out of the refrigerator for more than 48 hours. Throw away any unused medicine after the expiration date. NOTE: This sheet is a summary. It may not cover all possible information. If you have questions about this medicine, talk to your doctor, pharmacist, or health care provider.  2015, Elsevier/Gold Standard. (2013-06-11 16:14:05)  

## 2014-05-06 NOTE — Telephone Encounter (Signed)
-----   Message from Rosalie Gums, RN sent at 05/05/2014 10:53 AM EST ----- Regarding: New Pt callback Started Taxotere on 05/05/14. Pt of Dr.Shadad, prostate CA. Thanks! Danna

## 2014-05-06 NOTE — Telephone Encounter (Signed)
[name] here for Neulasta injection following 1st taxot chemotherapy.  States that he is doing well.  No nausea, vomiting, or diarrhea.  Is drinking and eating without difficulty.  All questions answered.  Knows to call if he has any problems or concerns.

## 2014-05-18 ENCOUNTER — Telehealth: Payer: Self-pay | Admitting: *Deleted

## 2014-05-18 NOTE — Telephone Encounter (Signed)
Patient would like to know if he has any traveling restrictions.  Has been invite to the [facility].  This nurse advised sun protection, sunscreen, hat etc.  Drink extra water.  Good handwashing using hand sanitizer especially after bathroom use.  Pack thermometer and prn medications.  If any new symptoms to go to the nearest Urgent Care or ER.  No further questions.

## 2014-05-20 ENCOUNTER — Ambulatory Visit: Payer: Medicare Other

## 2014-05-26 ENCOUNTER — Encounter: Payer: Self-pay | Admitting: Physician Assistant

## 2014-05-26 ENCOUNTER — Ambulatory Visit (HOSPITAL_BASED_OUTPATIENT_CLINIC_OR_DEPARTMENT_OTHER): Payer: Medicare Other

## 2014-05-26 ENCOUNTER — Other Ambulatory Visit (HOSPITAL_BASED_OUTPATIENT_CLINIC_OR_DEPARTMENT_OTHER): Payer: Medicare Other

## 2014-05-26 ENCOUNTER — Ambulatory Visit (HOSPITAL_BASED_OUTPATIENT_CLINIC_OR_DEPARTMENT_OTHER): Payer: Medicare Other | Admitting: Physician Assistant

## 2014-05-26 VITALS — BP 157/82 | HR 66 | Temp 98.2°F | Resp 18 | Ht 74.0 in | Wt 180.9 lb

## 2014-05-26 DIAGNOSIS — C61 Malignant neoplasm of prostate: Secondary | ICD-10-CM

## 2014-05-26 DIAGNOSIS — Z5111 Encounter for antineoplastic chemotherapy: Secondary | ICD-10-CM

## 2014-05-26 LAB — CBC WITH DIFFERENTIAL/PLATELET
BASO%: 0.9 % (ref 0.0–2.0)
BASOS ABS: 0.1 10*3/uL (ref 0.0–0.1)
EOS%: 0.2 % (ref 0.0–7.0)
Eosinophils Absolute: 0 10*3/uL (ref 0.0–0.5)
HCT: 43.5 % (ref 38.4–49.9)
HEMOGLOBIN: 15.1 g/dL (ref 13.0–17.1)
LYMPH%: 22.1 % (ref 14.0–49.0)
MCH: 30.2 pg (ref 27.2–33.4)
MCHC: 34.7 g/dL (ref 32.0–36.0)
MCV: 87 fL (ref 79.3–98.0)
MONO#: 0.5 10*3/uL (ref 0.1–0.9)
MONO%: 8.3 % (ref 0.0–14.0)
NEUT#: 3.9 10*3/uL (ref 1.5–6.5)
NEUT%: 68.5 % (ref 39.0–75.0)
Platelets: 272 10*3/uL (ref 140–400)
RBC: 5 10*6/uL (ref 4.20–5.82)
RDW: 14.3 % (ref 11.0–14.6)
WBC: 5.7 10*3/uL (ref 4.0–10.3)
lymph#: 1.3 10*3/uL (ref 0.9–3.3)

## 2014-05-26 LAB — COMPREHENSIVE METABOLIC PANEL (CC13)
ALBUMIN: 4.3 g/dL (ref 3.5–5.0)
ALT: 12 U/L (ref 0–55)
ANION GAP: 10 meq/L (ref 3–11)
AST: 13 U/L (ref 5–34)
Alkaline Phosphatase: 88 U/L (ref 40–150)
BUN: 17.3 mg/dL (ref 7.0–26.0)
CALCIUM: 10 mg/dL (ref 8.4–10.4)
CO2: 24 meq/L (ref 22–29)
Chloride: 111 mEq/L — ABNORMAL HIGH (ref 98–109)
Creatinine: 0.8 mg/dL (ref 0.7–1.3)
EGFR: >90 mL/min/{1.73_m2} (ref >90)
Glucose: 91 mg/dl (ref 70–140)
Potassium: 4.5 mEq/L (ref 3.5–5.1)
Sodium: 145 mEq/L (ref 136–145)
Total Bilirubin: 0.52 mg/dL (ref 0.20–1.20)
Total Protein: 7 g/dL (ref 6.4–8.3)

## 2014-05-26 MED ORDER — SODIUM CHLORIDE 0.9 % IV SOLN
Freq: Once | INTRAVENOUS | Status: AC
  Administered 2014-05-26: 12:00:00 via INTRAVENOUS

## 2014-05-26 MED ORDER — DEXAMETHASONE SODIUM PHOSPHATE 10 MG/ML IJ SOLN
INTRAMUSCULAR | Status: AC
  Filled 2014-05-26: qty 1

## 2014-05-26 MED ORDER — HEPARIN SOD (PORK) LOCK FLUSH 100 UNIT/ML IV SOLN
500.0000 [IU] | Freq: Once | INTRAVENOUS | Status: AC | PRN
  Administered 2014-05-26: 500 [IU]
  Filled 2014-05-26: qty 5

## 2014-05-26 MED ORDER — ONDANSETRON 8 MG/50ML IVPB (CHCC)
8.0000 mg | Freq: Once | INTRAVENOUS | Status: AC
  Administered 2014-05-26: 8 mg via INTRAVENOUS

## 2014-05-26 MED ORDER — SODIUM CHLORIDE 0.9 % IJ SOLN
10.0000 mL | INTRAMUSCULAR | Status: DC | PRN
  Administered 2014-05-26: 10 mL
  Filled 2014-05-26: qty 10

## 2014-05-26 MED ORDER — ONDANSETRON 8 MG/NS 50 ML IVPB
INTRAVENOUS | Status: AC
  Filled 2014-05-26: qty 8

## 2014-05-26 MED ORDER — DOCETAXEL CHEMO INJECTION 160 MG/16ML
75.0000 mg/m2 | Freq: Once | INTRAVENOUS | Status: AC
  Administered 2014-05-26: 150 mg via INTRAVENOUS
  Filled 2014-05-26: qty 15

## 2014-05-26 MED ORDER — DEXAMETHASONE SODIUM PHOSPHATE 10 MG/ML IJ SOLN
10.0000 mg | Freq: Once | INTRAMUSCULAR | Status: AC
  Administered 2014-05-26: 10 mg via INTRAVENOUS

## 2014-05-26 NOTE — Patient Instructions (Signed)
[facility] Discharge Instructions for Patients Receiving Chemotherapy  Today you received the following chemotherapy agents: Taxotere  To help prevent nausea and vomiting after your treatment, we encourage you to take your nausea medication as prescribed.    If you develop nausea and vomiting that is not controlled by your nausea medication, call the clinic.   BELOW ARE SYMPTOMS THAT SHOULD BE REPORTED IMMEDIATELY:  *FEVER GREATER THAN 100.5 F  *CHILLS WITH OR WITHOUT FEVER  NAUSEA AND VOMITING THAT IS NOT CONTROLLED WITH YOUR NAUSEA MEDICATION  *UNUSUAL SHORTNESS OF BREATH  *UNUSUAL BRUISING OR BLEEDING  TENDERNESS IN MOUTH AND THROAT WITH OR WITHOUT PRESENCE OF ULCERS  *URINARY PROBLEMS  *BOWEL PROBLEMS  UNUSUAL RASH Items with * indicate a potential emergency and should be followed up as soon as possible.  Feel free to call the clinic you have any questions or concerns. The clinic phone number is [phone].    

## 2014-05-26 NOTE — Progress Notes (Signed)
Hematology and Oncology Follow Up Visit  [name] [id number] 03/02/1946 69 y.o. 05/26/2014 4:39 PM   Principle Diagnosis: 69 year old gentleman with prostate cancer diagnosed in October 2015. He had a Gleason score 4+5 = 9 and a PSA of 116. Abdominal imaging showed bulky retroperitoneal lymphadenopathy. He does not have any bony involvement.   Prior Therapy: Status post prostate biopsy on 01/19/2014.  Current therapy:  Androgen deprivation under the care of Dr. [name]. Systemic chemotherapy in the form of Taxotere 75 mg/m every 3 weeks with plans for total of 6 cycles. Cycle 1 will be given on 05/05/2014.  Interim History:  [name] presents today for a follow-up visit. He is currently undergoing treatment with Taxotere 75 mg router squared given every 3 weeks, status post one cycle.He continues on androgen deprivation under the care of Dr. [name] in addition to Casodex. He presents today to proceed with cycle #2 of his chemotherapy.  He does not report any headaches, blurry vision or syncope. He does not report any seizures or any neurological symptoms. He does not report any fevers, chills, weight loss, appetite changes or decline his energy. He does not report any chest pain, palpitation, orthopnea or leg edema. He does not report any cough, hemoptysis or hematemesis. He does not report any nausea, vomiting, abdominal pain, constipation or diarrhea. He does not report any frequency, urgency, hesitancy or nocturia. He does not report any incontinence or hematuria. He does not report any skeletal complaints of arthralgias myalgias. He does not report any early satiety or abdominal distention. Remainder of his review of systems unremarkable.   Medications: I have reviewed the patient's current medications.  Current Outpatient Prescriptions  Medication Sig Dispense Refill  . aspirin 81 MG tablet Take 81 mg by mouth daily.    . diphenhydrAMINE (BENADRYL) 25 mg capsule Take 25 mg by mouth  every 6 (six) hours as needed.    . diphenhydramine-acetaminophen (TYLENOL PM) 25-500 MG TABS Take 1 tablet by mouth at bedtime as needed (for pain/sleep).     Marland Kitchen lidocaine-prilocaine (EMLA) cream Apply 1 application topically as needed. Apply to port on the day of chemotherapy. 30 g 0  . meclizine (ANTIVERT) 25 MG tablet Take 25 mg by mouth 3 (three) times daily.  1  . ondansetron (ZOFRAN) 8 MG tablet Take 1 tablet (8 mg total) by mouth every 8 (eight) hours as needed for nausea or vomiting. 20 tablet 0  . sodium chloride (OCEAN) 0.65 % SOLN nasal spray Place 1 spray into both nostrils as needed for congestion.     No current facility-administered medications for this visit.   Facility-Administered Medications Ordered in Other Visits  Medication Dose Route Frequency Provider Last Rate Last Dose  . sodium chloride 0.9 % injection 10 mL  10 mL Intracatheter PRN Wyatt Portela, MD   10 mL at 05/26/14 1315     Allergies: No Known Allergies  Past Medical History, Surgical history, Social history, and Family History were reviewed and updated.   Physical Exam: Blood pressure 157/82, pulse 66, temperature 98.2 F (36.8 C), temperature source Oral, resp. rate 18, height 6\' 2"  (1.88 m), weight 180 lb 14.4 oz (82.056 kg), SpO2 100 %. ECOG: 0 General appearance: alert and cooperative Head: Normocephalic, without obvious abnormality Neck: no adenopathy Lymph nodes: Cervical, supraclavicular, and axillary nodes normal. Heart:regular rate and rhythm, S1, S2 normal, no murmur, click, rub or gallop Lung:chest clear, no wheezing, rales, normal symmetric air entry Abdomin: soft, non-tender, without masses  or organomegaly EXT:no erythema, induration, or nodules Right anterior chest Port-A-Cath, non-tender well-healed incision, no evidence of infection  Lab Results: Lab Results  Component Value Date   WBC 5.7 05/26/2014   HGB 15.1 05/26/2014   HCT 43.5 05/26/2014   MCV 87.0 05/26/2014   PLT 272  05/26/2014     Chemistry      Component Value Date/Time   NA 145 05/26/2014 1024   NA 142 04/30/2014 0945   K 4.5 05/26/2014 1024   K 3.8 04/30/2014 0945   CL 110 04/30/2014 0945   CO2 24 05/26/2014 1024   CO2 25 04/30/2014 0945   BUN 17.3 05/26/2014 1024   BUN 20 04/30/2014 0945   CREATININE 0.8 05/26/2014 1024   CREATININE 0.92 04/30/2014 0945      Component Value Date/Time   CALCIUM 10.0 05/26/2014 1024   CALCIUM 9.6 04/30/2014 0945   ALKPHOS 88 05/26/2014 1024   ALKPHOS 54 12/09/2012 0940   AST 13 05/26/2014 1024   AST 17 12/09/2012 0940   ALT 12 05/26/2014 1024   ALT 14 12/09/2012 0940   BILITOT 0.52 05/26/2014 1024   BILITOT 1.0 12/09/2012 0940        Impression and Plan:  69 year old gentleman with the following issues:  1. Advanced prostate cancer presenting with stage IV disease. His Gleason score is 4+5 = 9 with heavy volume disease involving 11 out of 12 cores of his biopsy obtained in October 2015. His PSA is 116 and his staging workup revealed lymphadenopathy in the periaortic and iliac chains. He started androgen depravation under the care of Dr. [name].  He is currently being treated with systemic chemotherapy in the form of Taxotere at 75 mg/m given every 3 weeks in addition to androgen depravation. He is status post 1 cycle of chemotherapy. He will proceed with cycle #2 without delay or dose reduction. A total of 6 cycles are planned.  2. IV access: Port-A-Cath inserted without any complications. EMLA cream was given to the patient.  3. Antiemetics: He has Zofran available at home if needed.  4. Androgen depravation: This will continued under the care of Dr. [name].  5. Neutropenia prophylaxis: He will receive Neulasta after each injection of chemotherapy.  6. Follow-up: Will be in 3 weeks for cycle 2 of chemotherapy.   Carlton Adam, PA-C  3/2/20164:39 PM

## 2014-05-27 ENCOUNTER — Ambulatory Visit (HOSPITAL_BASED_OUTPATIENT_CLINIC_OR_DEPARTMENT_OTHER): Payer: Medicare Other

## 2014-05-27 DIAGNOSIS — Z5189 Encounter for other specified aftercare: Secondary | ICD-10-CM

## 2014-05-27 DIAGNOSIS — C61 Malignant neoplasm of prostate: Secondary | ICD-10-CM | POA: Diagnosis not present

## 2014-05-27 LAB — PSA: PSA: 9.39 ng/mL — AB (ref <=4.00)

## 2014-05-27 MED ORDER — PEGFILGRASTIM INJECTION 6 MG/0.6ML ~~LOC~~
6.0000 mg | PREFILLED_SYRINGE | Freq: Once | SUBCUTANEOUS | Status: AC
  Administered 2014-05-27: 6 mg via SUBCUTANEOUS
  Filled 2014-05-27: qty 0.6

## 2014-05-27 NOTE — Patient Instructions (Signed)
Pegfilgrastim injection What is this medicine? PEGFILGRASTIM (peg fil GRA stim) is a long-acting granulocyte colony-stimulating factor that stimulates the growth of neutrophils, a type of white blood cell important in the body's fight against infection. It is used to reduce the incidence of fever and infection in patients with certain types of cancer who are receiving chemotherapy that affects the bone marrow. This medicine may be used for other purposes; ask your health care provider or pharmacist if you have questions. COMMON BRAND NAME(S): Neulasta What should I tell my health care provider before I take this medicine? They need to know if you have any of these conditions: -latex allergy -ongoing radiation therapy -sickle cell disease -skin reactions to acrylic adhesives (On-Body Injector only) -an unusual or allergic reaction to pegfilgrastim, filgrastim, other medicines, foods, dyes, or preservatives -pregnant or trying to get pregnant -breast-feeding How should I use this medicine? This medicine is for injection under the skin. If you get this medicine at home, you will be taught how to prepare and give the pre-filled syringe or how to use the On-body Injector. Refer to the patient Instructions for Use for detailed instructions. Use exactly as directed. Take your medicine at regular intervals. Do not take your medicine more often than directed. It is important that you put your used needles and syringes in a special sharps container. Do not put them in a trash can. If you do not have a sharps container, call your pharmacist or healthcare provider to get one. Talk to your pediatrician regarding the use of this medicine in children. Special care may be needed. Overdosage: If you think you have taken too much of this medicine contact a poison control center or emergency room at once. NOTE: This medicine is only for you. Do not share this medicine with others. What if I miss a dose? It is  important not to miss your dose. Call your doctor or health care professional if you miss your dose. If you miss a dose due to an On-body Injector failure or leakage, a new dose should be administered as soon as possible using a single prefilled syringe for manual use. What may interact with this medicine? Interactions have not been studied. Give your health care provider a list of all the medicines, herbs, non-prescription drugs, or dietary supplements you use. Also tell them if you smoke, drink alcohol, or use illegal drugs. Some items may interact with your medicine. This list may not describe all possible interactions. Give your health care provider a list of all the medicines, herbs, non-prescription drugs, or dietary supplements you use. Also tell them if you smoke, drink alcohol, or use illegal drugs. Some items may interact with your medicine. What should I watch for while using this medicine? You may need blood work done while you are taking this medicine. If you are going to need a MRI, CT scan, or other procedure, tell your doctor that you are using this medicine (On-Body Injector only). What side effects may I notice from receiving this medicine? Side effects that you should report to your doctor or health care professional as soon as possible: -allergic reactions like skin rash, itching or hives, swelling of the face, lips, or tongue -dizziness -fever -pain, redness, or irritation at site where injected -pinpoint red spots on the skin -shortness of breath or breathing problems -stomach or side pain, or pain at the shoulder -swelling -tiredness -trouble passing urine Side effects that usually do not require medical attention (report to your doctor   or health care professional if they continue or are bothersome): -bone pain -muscle pain This list may not describe all possible side effects. Call your doctor for medical advice about side effects. You may report side effects to FDA at  1-800-FDA-1088. Where should I keep my medicine? Keep out of the reach of children. Store pre-filled syringes in a refrigerator between 2 and 8 degrees C (36 and 46 degrees F). Do not freeze. Keep in carton to protect from light. Throw away this medicine if it is left out of the refrigerator for more than 48 hours. Throw away any unused medicine after the expiration date. NOTE: This sheet is a summary. It may not cover all possible information. If you have questions about this medicine, talk to your doctor, pharmacist, or health care provider.  2015, Elsevier/Gold Standard. (2013-06-11 16:14:05)  

## 2014-05-28 NOTE — Patient Instructions (Signed)
Follow-up in 3 weeks prior to next scheduled cycle of chemotherapy 

## 2014-06-04 ENCOUNTER — Encounter: Payer: Self-pay | Admitting: *Deleted

## 2014-06-04 ENCOUNTER — Telehealth: Payer: Self-pay | Admitting: *Deleted

## 2014-06-04 NOTE — Telephone Encounter (Signed)
WILL TAKE PT.'S SUMMONS TO DR.SHADAD'S NURSE, STACEY CAMP,RN.

## 2014-06-04 NOTE — Telephone Encounter (Signed)
This RN spoke with patient and informed him that his letter for jury duty is ready for pickup at the front desk. Patient verbalized understanding. Patient to come after lunch and get letter.

## 2014-06-18 ENCOUNTER — Ambulatory Visit (HOSPITAL_BASED_OUTPATIENT_CLINIC_OR_DEPARTMENT_OTHER): Payer: Medicare Other

## 2014-06-18 ENCOUNTER — Telehealth: Payer: Self-pay | Admitting: Oncology

## 2014-06-18 ENCOUNTER — Other Ambulatory Visit (HOSPITAL_BASED_OUTPATIENT_CLINIC_OR_DEPARTMENT_OTHER): Payer: Medicare Other

## 2014-06-18 ENCOUNTER — Ambulatory Visit (HOSPITAL_BASED_OUTPATIENT_CLINIC_OR_DEPARTMENT_OTHER): Payer: Medicare Other | Admitting: Oncology

## 2014-06-18 ENCOUNTER — Telehealth: Payer: Self-pay | Admitting: *Deleted

## 2014-06-18 VITALS — BP 156/78 | HR 66 | Temp 97.7°F | Resp 18 | Ht 74.0 in | Wt 179.8 lb

## 2014-06-18 DIAGNOSIS — Z5111 Encounter for antineoplastic chemotherapy: Secondary | ICD-10-CM

## 2014-06-18 DIAGNOSIS — C61 Malignant neoplasm of prostate: Secondary | ICD-10-CM

## 2014-06-18 DIAGNOSIS — E291 Testicular hypofunction: Secondary | ICD-10-CM

## 2014-06-18 LAB — COMPREHENSIVE METABOLIC PANEL (CC13)
ALT: 13 U/L (ref 0–55)
ANION GAP: 10 meq/L (ref 3–11)
AST: 12 U/L (ref 5–34)
Albumin: 4.1 g/dL (ref 3.5–5.0)
Alkaline Phosphatase: 86 U/L (ref 40–150)
BUN: 15.4 mg/dL (ref 7.0–26.0)
CALCIUM: 9.8 mg/dL (ref 8.4–10.4)
CO2: 26 meq/L (ref 22–29)
CREATININE: 0.8 mg/dL (ref 0.7–1.3)
Chloride: 107 mEq/L (ref 98–109)
EGFR: >90 mL/min/{1.73_m2} (ref >90)
GLUCOSE: 96 mg/dL (ref 70–140)
Potassium: 4.6 mEq/L (ref 3.5–5.1)
Sodium: 143 mEq/L (ref 136–145)
Total Bilirubin: 0.61 mg/dL (ref 0.20–1.20)
Total Protein: 7.1 g/dL (ref 6.4–8.3)

## 2014-06-18 LAB — CBC WITH DIFFERENTIAL/PLATELET
BASO%: 0.5 % (ref 0.0–2.0)
BASOS ABS: 0 10*3/uL (ref 0.0–0.1)
EOS%: 0.5 % (ref 0.0–7.0)
Eosinophils Absolute: 0 10*3/uL (ref 0.0–0.5)
HCT: 40.5 % (ref 38.4–49.9)
HGB: 13.9 g/dL (ref 13.0–17.1)
LYMPH%: 19.3 % (ref 14.0–49.0)
MCH: 30.2 pg (ref 27.2–33.4)
MCHC: 34.3 g/dL (ref 32.0–36.0)
MCV: 87.9 fL (ref 79.3–98.0)
MONO#: 0.6 10*3/uL (ref 0.1–0.9)
MONO%: 10.3 % (ref 0.0–14.0)
NEUT#: 4.3 10*3/uL (ref 1.5–6.5)
NEUT%: 69.4 % (ref 39.0–75.0)
Platelets: 225 10*3/uL (ref 140–400)
RBC: 4.61 10*6/uL (ref 4.20–5.82)
RDW: 15.1 % — AB (ref 11.0–14.6)
WBC: 6.2 10*3/uL (ref 4.0–10.3)
lymph#: 1.2 10*3/uL (ref 0.9–3.3)

## 2014-06-18 MED ORDER — SODIUM CHLORIDE 0.9 % IJ SOLN
10.0000 mL | INTRAMUSCULAR | Status: DC | PRN
  Administered 2014-06-18: 10 mL
  Filled 2014-06-18: qty 10

## 2014-06-18 MED ORDER — HEPARIN SOD (PORK) LOCK FLUSH 100 UNIT/ML IV SOLN
500.0000 [IU] | Freq: Once | INTRAVENOUS | Status: AC | PRN
  Administered 2014-06-18: 500 [IU]
  Filled 2014-06-18: qty 5

## 2014-06-18 MED ORDER — SODIUM CHLORIDE 0.9 % IV SOLN
Freq: Once | INTRAVENOUS | Status: AC
  Administered 2014-06-18: 11:00:00 via INTRAVENOUS

## 2014-06-18 MED ORDER — DEXAMETHASONE SODIUM PHOSPHATE 100 MG/10ML IJ SOLN
Freq: Once | INTRAMUSCULAR | Status: AC
  Administered 2014-06-18: 11:00:00 via INTRAVENOUS
  Filled 2014-06-18: qty 4

## 2014-06-18 MED ORDER — DOCETAXEL CHEMO INJECTION 160 MG/16ML
75.0000 mg/m2 | Freq: Once | INTRAVENOUS | Status: AC
  Administered 2014-06-18: 150 mg via INTRAVENOUS
  Filled 2014-06-18: qty 15

## 2014-06-18 NOTE — Telephone Encounter (Signed)
Pt confirmed labs/ov per 03/25 POF, gave pt AVS and Calendar..... KJ, sent msg to add chemo °

## 2014-06-18 NOTE — Patient Instructions (Signed)
[facility] Discharge Instructions for Patients Receiving Chemotherapy  Today you received the following chemotherapy agents Taxotere.  To help prevent nausea and vomiting after your treatment, we encourage you to take your nausea medication as prescribed.   If you develop nausea and vomiting that is not controlled by your nausea medication, call the clinic.   BELOW ARE SYMPTOMS THAT SHOULD BE REPORTED IMMEDIATELY:  *FEVER GREATER THAN 100.5 F  *CHILLS WITH OR WITHOUT FEVER  NAUSEA AND VOMITING THAT IS NOT CONTROLLED WITH YOUR NAUSEA MEDICATION  *UNUSUAL SHORTNESS OF BREATH  *UNUSUAL BRUISING OR BLEEDING  TENDERNESS IN MOUTH AND THROAT WITH OR WITHOUT PRESENCE OF ULCERS  *URINARY PROBLEMS  *BOWEL PROBLEMS  UNUSUAL RASH Items with * indicate a potential emergency and should be followed up as soon as possible.  Feel free to call the clinic you have any questions or concerns. The clinic phone number is [phone].  Please show the CHEMO ALERT CARD at check-in to the Emergency Department and triage nurse.   

## 2014-06-18 NOTE — Telephone Encounter (Signed)
Per staff message and POF I have scheduled appts. Advised scheduler of appts. JMW  

## 2014-06-18 NOTE — Progress Notes (Signed)
Hematology and Oncology Follow Up Visit  [name] [id number] 1945-12-30 69 y.o. 06/18/2014 10:17 AM   Principle Diagnosis: 69 year old gentleman with prostate cancer diagnosed in October 2015. He had a Gleason score 4+5 = 9 and a PSA of 116. Abdominal imaging showed bulky retroperitoneal lymphadenopathy. He does not have any bony involvement.   Prior Therapy: Status post prostate biopsy on 01/19/2014.  Current therapy:  Androgen deprivation under the care of Dr. [name]. Systemic chemotherapy in the form of Taxotere 75 mg/m every 3 weeks with plans for total of 6 cycles. Cycle 1 will be given on 05/05/2014. He is here for cycle 3.  Interim History:  [name] presents today for a follow-up visit with his wife. Since the last visit, he tolerated chemotherapy with very few complications. He did report grade 1 fatigue as well as grade 1 alteration of his taste but is have been manageable. He still able to function properly and exercises regularly. He did not have any peripheral neuropathy or cold sensitivity. He did not have any nail changes or lacrimal duct changes. He does not report any headaches, blurry vision or syncope. He does not report any seizures or any neurological symptoms. He does not report any fevers, chills, weight loss, appetite changes or decline his energy. He does not report any chest pain, palpitation, orthopnea or leg edema. He does not report any cough, hemoptysis or hematemesis. He does not report any nausea, vomiting, abdominal pain, constipation or diarrhea. He does not report any frequency, urgency, hesitancy or nocturia. He does not report any incontinence or hematuria. He does not report any skeletal complaints of arthralgias myalgias. He does not report any early satiety or abdominal distention. Remainder of his review of systems unremarkable.   Medications: I have reviewed the patient's current medications.  Current Outpatient Prescriptions  Medication Sig Dispense  Refill  . aspirin 81 MG tablet Take 81 mg by mouth daily.    . diphenhydrAMINE (BENADRYL) 25 mg capsule Take 25 mg by mouth every 6 (six) hours as needed.    . diphenhydramine-acetaminophen (TYLENOL PM) 25-500 MG TABS Take 1 tablet by mouth at bedtime as needed (for pain/sleep).     Marland Kitchen lidocaine-prilocaine (EMLA) cream Apply 1 application topically as needed. Apply to port on the day of chemotherapy. 30 g 0  . loratadine (CLARITIN) 10 MG tablet Take 10 mg by mouth daily. As needed for allergies    . meclizine (ANTIVERT) 25 MG tablet Take 25 mg by mouth 3 (three) times daily.  1  . ondansetron (ZOFRAN) 8 MG tablet Take 1 tablet (8 mg total) by mouth every 8 (eight) hours as needed for nausea or vomiting. 20 tablet 0  . sodium chloride (OCEAN) 0.65 % SOLN nasal spray Place 1 spray into both nostrils as needed for congestion.     No current facility-administered medications for this visit.     Allergies: No Known Allergies  Past Medical History, Surgical history, Social history, and Family History were reviewed and updated.   Physical Exam: Blood pressure 156/78, pulse 66, temperature 97.7 F (36.5 C), temperature source Oral, resp. rate 18, height 6\' 2"  (1.88 m), weight 179 lb 12.8 oz (81.557 kg), SpO2 100 %. ECOG: 0 General appearance: alert and cooperative Head: Normocephalic, without obvious abnormality Neck: no adenopathy Lymph nodes: Cervical, supraclavicular, and axillary nodes normal. Heart:regular rate and rhythm, S1, S2 normal, no murmur, click, rub or gallop Lung:chest clear, no wheezing, rales, normal symmetric air entry Abdomin: soft, non-tender, without  masses or organomegaly EXT:no erythema, induration, or nodules Right anterior chest Port-A-Cath, non-tender well-healed incision, no evidence of infection  Lab Results: Lab Results  Component Value Date   WBC 6.2 06/18/2014   HGB 13.9 06/18/2014   HCT 40.5 06/18/2014   MCV 87.9 06/18/2014   PLT 225 06/18/2014      Chemistry      Component Value Date/Time   NA 145 05/26/2014 1024   NA 142 04/30/2014 0945   K 4.5 05/26/2014 1024   K 3.8 04/30/2014 0945   CL 110 04/30/2014 0945   CO2 24 05/26/2014 1024   CO2 25 04/30/2014 0945   BUN 17.3 05/26/2014 1024   BUN 20 04/30/2014 0945   CREATININE 0.8 05/26/2014 1024   CREATININE 0.92 04/30/2014 0945      Component Value Date/Time   CALCIUM 10.0 05/26/2014 1024   CALCIUM 9.6 04/30/2014 0945   ALKPHOS 88 05/26/2014 1024   ALKPHOS 54 12/09/2012 0940   AST 13 05/26/2014 1024   AST 17 12/09/2012 0940   ALT 12 05/26/2014 1024   ALT 14 12/09/2012 0940   BILITOT 0.52 05/26/2014 1024   BILITOT 1.0 12/09/2012 0940      Results for [name] (MRN [id number]) as of 06/18/2014 10:03  Ref. Range 05/05/2014 09:17 05/26/2014 10:24  PSA Latest Range: <=4.00 ng/mL 145.30 (H) 9.39 (H)    Impression and Plan:  69 year old gentleman with the following issues:  1. Advanced prostate cancer presenting with stage IV disease. His Gleason score is 4+5 = 9 with heavy volume disease involving 11 out of 12 cores of his biopsy obtained in October 2015. His PSA is 116 and his staging workup revealed lymphadenopathy in the periaortic and iliac chains. He started androgen depravation under the care of Dr. [name].  He is currently being treated with systemic chemotherapy in the form of Taxotere at 75 mg/m given every 3 weeks in addition to androgen depravation. He is status post 2 cycles of chemotherapy without any complications. He will proceed with cycle #3 without delay or dose reduction. A total of 6 cycles are planned.  2. IV access: Port-A-Cath inserted without any complications. EMLA cream was given to the patient.  3. Antiemetics: He has Zofran available at home if needed.  4. Androgen depravation: This will continued under the care of Dr. [name].  5. Neutropenia prophylaxis: He will receive Neulasta after each injection of chemotherapy.  6. Follow-up: Will be in  3 weeks for cycle 3 of chemotherapy.   [facility], MD 3/25/201610:17 AM

## 2014-06-19 ENCOUNTER — Ambulatory Visit (HOSPITAL_BASED_OUTPATIENT_CLINIC_OR_DEPARTMENT_OTHER): Payer: Medicare Other

## 2014-06-19 DIAGNOSIS — Z5189 Encounter for other specified aftercare: Secondary | ICD-10-CM

## 2014-06-19 DIAGNOSIS — C61 Malignant neoplasm of prostate: Secondary | ICD-10-CM

## 2014-06-19 LAB — PSA: PSA: 1.75 ng/mL (ref <=4.00)

## 2014-06-19 MED ORDER — PEGFILGRASTIM INJECTION 6 MG/0.6ML ~~LOC~~
6.0000 mg | PREFILLED_SYRINGE | Freq: Once | SUBCUTANEOUS | Status: AC
  Administered 2014-06-19: 6 mg via SUBCUTANEOUS

## 2014-06-21 ENCOUNTER — Telehealth: Payer: Self-pay | Admitting: *Deleted

## 2014-06-21 NOTE — Telephone Encounter (Signed)
-----   Message from Wyatt Portela, MD sent at 06/21/2014  8:38 AM EDT ----- Please call his PSA.

## 2014-06-21 NOTE — Telephone Encounter (Signed)
Pt notified of PSA 1.75

## 2014-07-06 DIAGNOSIS — C61 Malignant neoplasm of prostate: Secondary | ICD-10-CM | POA: Diagnosis not present

## 2014-07-09 ENCOUNTER — Ambulatory Visit (HOSPITAL_BASED_OUTPATIENT_CLINIC_OR_DEPARTMENT_OTHER): Payer: Medicare Other | Admitting: Physician Assistant

## 2014-07-09 ENCOUNTER — Ambulatory Visit (HOSPITAL_BASED_OUTPATIENT_CLINIC_OR_DEPARTMENT_OTHER): Payer: Medicare Other

## 2014-07-09 ENCOUNTER — Other Ambulatory Visit (HOSPITAL_COMMUNITY)
Admission: AD | Admit: 2014-07-09 | Discharge: 2014-07-09 | Disposition: A | Discharge status: Home / Self Care | Payer: Medicare Other | Source: Ambulatory Visit | Attending: Oncology | Admitting: Oncology

## 2014-07-09 ENCOUNTER — Other Ambulatory Visit (HOSPITAL_BASED_OUTPATIENT_CLINIC_OR_DEPARTMENT_OTHER): Payer: Medicare Other

## 2014-07-09 ENCOUNTER — Encounter: Payer: Self-pay | Admitting: Physician Assistant

## 2014-07-09 VITALS — BP 130/70 | HR 67 | Temp 97.7°F | Resp 18 | Ht 74.0 in | Wt 180.8 lb

## 2014-07-09 DIAGNOSIS — E291 Testicular hypofunction: Secondary | ICD-10-CM | POA: Diagnosis not present

## 2014-07-09 DIAGNOSIS — D709 Neutropenia, unspecified: Secondary | ICD-10-CM

## 2014-07-09 DIAGNOSIS — Z5111 Encounter for antineoplastic chemotherapy: Secondary | ICD-10-CM

## 2014-07-09 DIAGNOSIS — C779 Secondary and unspecified malignant neoplasm of lymph node, unspecified: Secondary | ICD-10-CM

## 2014-07-09 DIAGNOSIS — C61 Malignant neoplasm of prostate: Secondary | ICD-10-CM | POA: Insufficient documentation

## 2014-07-09 LAB — COMPREHENSIVE METABOLIC PANEL
ALBUMIN: 4.6 g/dL (ref 3.5–5.2)
ALT: 14 U/L (ref 0–53)
AST: 14 U/L (ref 0–37)
Alkaline Phosphatase: 80 U/L (ref 39–117)
Anion gap: 8 (ref 5–15)
BUN: 19 mg/dL (ref 6–23)
CALCIUM: 9.7 mg/dL (ref 8.4–10.5)
CO2: 27 mmol/L (ref 19–32)
CREATININE: 0.77 mg/dL (ref 0.50–1.35)
Chloride: 108 mmol/L (ref 96–112)
Glucose, Bld: 96 mg/dL (ref 70–99)
Potassium: 4.7 mmol/L (ref 3.5–5.1)
Sodium: 143 mmol/L (ref 135–145)
Total Bilirubin: 0.2 mg/dL — ABNORMAL LOW (ref 0.3–1.2)
Total Protein: 7.4 g/dL (ref 6.0–8.3)

## 2014-07-09 LAB — CBC WITH DIFFERENTIAL/PLATELET
BASO%: 0.8 % (ref 0.0–2.0)
Basophils Absolute: 0 10*3/uL (ref 0.0–0.1)
EOS%: 0.1 % (ref 0.0–7.0)
Eosinophils Absolute: 0 10*3/uL (ref 0.0–0.5)
HCT: 40.8 % (ref 38.4–49.9)
HGB: 13.6 g/dL (ref 13.0–17.1)
LYMPH%: 17.9 % (ref 14.0–49.0)
MCH: 29.6 pg (ref 27.2–33.4)
MCHC: 33.3 g/dL (ref 32.0–36.0)
MCV: 88.8 fL (ref 79.3–98.0)
MONO#: 0.6 10*3/uL (ref 0.1–0.9)
MONO%: 9.6 % (ref 0.0–14.0)
NEUT%: 71.6 % (ref 39.0–75.0)
NEUTROS ABS: 4.5 10*3/uL (ref 1.5–6.5)
PLATELETS: 266 10*3/uL (ref 140–400)
RBC: 4.6 10*6/uL (ref 4.20–5.82)
RDW: 15.9 % — ABNORMAL HIGH (ref 11.0–14.6)
WBC: 6.2 10*3/uL (ref 4.0–10.3)
lymph#: 1.1 10*3/uL (ref 0.9–3.3)

## 2014-07-09 MED ORDER — HEPARIN SOD (PORK) LOCK FLUSH 100 UNIT/ML IV SOLN
500.0000 [IU] | Freq: Once | INTRAVENOUS | Status: AC | PRN
  Administered 2014-07-09: 500 [IU]
  Filled 2014-07-09: qty 5

## 2014-07-09 MED ORDER — SODIUM CHLORIDE 0.9 % IV SOLN
Freq: Once | INTRAVENOUS | Status: AC
  Administered 2014-07-09: 12:00:00 via INTRAVENOUS
  Filled 2014-07-09: qty 4

## 2014-07-09 MED ORDER — SODIUM CHLORIDE 0.9 % IJ SOLN
10.0000 mL | INTRAMUSCULAR | Status: DC | PRN
  Administered 2014-07-09: 10 mL
  Filled 2014-07-09: qty 10

## 2014-07-09 MED ORDER — DEXTROSE 5 % IV SOLN
75.0000 mg/m2 | Freq: Once | INTRAVENOUS | Status: AC
  Administered 2014-07-09: 150 mg via INTRAVENOUS
  Filled 2014-07-09: qty 15

## 2014-07-09 MED ORDER — SODIUM CHLORIDE 0.9 % IV SOLN
Freq: Once | INTRAVENOUS | Status: AC
  Administered 2014-07-09: 12:00:00 via INTRAVENOUS

## 2014-07-09 NOTE — Progress Notes (Signed)
Hematology and Oncology Follow Up Visit  [name] [id number] 1945-05-04 69 y.o. 07/09/2014 4:06 PM   Principle Diagnosis: 69 year old gentleman with prostate cancer diagnosed in October 2015. He had a Gleason score 4+5 = 9 and a PSA of 116. Abdominal imaging showed bulky retroperitoneal lymphadenopathy. He does not have any bony involvement.   Prior Therapy: Status post prostate biopsy on 01/19/2014.  Current therapy:  Androgen deprivation under the care of Dr. Tresa [name]. Systemic chemotherapy in the form of Taxotere 75 mg/m every 3 weeks with plans for total of 6 cycles. Cycle 1 will be given on 05/05/2014. He is here for cycle 4.  Interim History:  [name] presents today for a follow-up visit with his wife. Since the last visit, he tolerated chemotherapy with very few complications. He did report grade 1 fatigue as well as grade 1 alteration of his taste  That lasts for 7-10 days after chemotherapy, but is have been manageable. He reports occasional nausea that is well managed with his current anti-emetic.He still able to function properly and exercises regularly. He denies having any peripheral neuropathy or cold sensitivity. He did not have any nail changes or lacrimal duct changes. He does not report any headaches, blurry vision or syncope. He does not report any seizures or any neurological symptoms. He does not report any fevers, chills, weight loss, appetite changes or decline his energy. He does not report any chest pain, palpitation, orthopnea or leg edema. He does not report any cough, hemoptysis or hematemesis. He does not report any nausea, vomiting, abdominal pain, constipation or diarrhea. He does not report any frequency, urgency, hesitancy or nocturia. He does not report any incontinence or hematuria. He does not report any skeletal complaints of arthralgias myalgias. He does not report any early satiety or abdominal distention. Remainder of his review of systems unremarkable.    Medications: I have reviewed the patient's current medications.  Current Outpatient Prescriptions  Medication Sig Dispense Refill  . aspirin 81 MG tablet Take 81 mg by mouth daily.    . diphenhydrAMINE (BENADRYL) 25 mg capsule Take 25 mg by mouth every 6 (six) hours as needed.    . diphenhydramine-acetaminophen (TYLENOL PM) 25-500 MG TABS Take 1 tablet by mouth at bedtime as needed (for pain/sleep).     Marland Kitchen lidocaine-prilocaine (EMLA) cream Apply 1 application topically as needed. Apply to port on the day of chemotherapy. 30 g 0  . loratadine (CLARITIN) 10 MG tablet Take 10 mg by mouth daily. As needed for allergies    . meclizine (ANTIVERT) 25 MG tablet Take 25 mg by mouth 3 (three) times daily.  1  . ondansetron (ZOFRAN) 8 MG tablet Take 1 tablet (8 mg total) by mouth every 8 (eight) hours as needed for nausea or vomiting. 20 tablet 0  . sodium chloride (OCEAN) 0.65 % SOLN nasal spray Place 1 spray into both nostrils as needed for congestion.     No current facility-administered medications for this visit.   Facility-Administered Medications Ordered in Other Visits  Medication Dose Route Frequency Provider Last Rate Last Dose  . sodium chloride 0.9 % injection 10 mL  10 mL Intracatheter PRN [name] Portela, MD   10 mL at 07/09/14 1328     Allergies: No Known Allergies  Past Medical History, Surgical history, Social history, and Family History were reviewed and updated.   Physical Exam: Blood pressure 130/70, pulse 67, temperature 97.7 F (36.5 C), temperature source Oral, resp. rate 18, height 6\' 2"  (  1.88 m), weight 180 lb 12.8 oz (82.01 kg), SpO2 99 %. ECOG: 0 General appearance: alert and cooperative Head: Normocephalic, without obvious abnormality Neck: no adenopathy Lymph nodes: Cervical, supraclavicular, and axillary nodes normal. Heart:regular rate and rhythm, S1, S2 normal, no murmur, click, rub or gallop Lung:chest clear, no wheezing, rales, normal symmetric air  entry Abdomin: soft, non-tender, without masses or organomegaly EXT:no erythema, induration, or nodules Right anterior chest Port-A-Cath, non-tender well-healed incision, no evidence of infection  Lab Results: Lab Results  Component Value Date   WBC 6.2 07/09/2014   HGB 13.6 07/09/2014   HCT 40.8 07/09/2014   MCV 88.8 07/09/2014   PLT 266 07/09/2014     Chemistry      Component Value Date/Time   NA 143 07/09/2014 1153   NA 143 06/18/2014 0949   K 4.7 07/09/2014 1153   K 4.6 06/18/2014 0949   CL 108 07/09/2014 1153   CO2 27 07/09/2014 1153   CO2 26 06/18/2014 0949   BUN 19 07/09/2014 1153   BUN 15.4 06/18/2014 0949   CREATININE 0.77 07/09/2014 1153   CREATININE 0.8 06/18/2014 0949      Component Value Date/Time   CALCIUM 9.7 07/09/2014 1153   CALCIUM 9.8 06/18/2014 0949   ALKPHOS 80 07/09/2014 1153   ALKPHOS 86 06/18/2014 0949   AST 14 07/09/2014 1153   AST 12 06/18/2014 0949   ALT 14 07/09/2014 1153   ALT 13 06/18/2014 0949   BILITOT 0.2* 07/09/2014 1153   BILITOT 0.61 06/18/2014 0949      Results for [name], [name] NASTASI (MRN [id number]) as of 06/18/2014 10:03  Ref. Range 05/05/2014 09:17 05/26/2014 10:24  PSA Latest Range: <=4.00 ng/mL 145.30 (H) 9.39 (H)    Impression and Plan:  69 year old gentleman with the following issues:  1. Advanced prostate cancer presenting with stage IV disease. His Gleason score is 4+5 = 9 with heavy volume disease involving 11 out of 12 cores of his biopsy obtained in October 2015. His PSA is 116 and his staging workup revealed lymphadenopathy in the periaortic and iliac chains. He started androgen depravation under the care of Dr. Tresa [name].  He is currently being treated with systemic chemotherapy in the form of Taxotere at 75 mg/m given every 3 weeks in addition to androgen depravation. He is status post 3 cycles of chemotherapy without any complications. He will proceed with cycle #4 without delay or dose reduction. A total of 6 cycles are  planned.  2. IV access: Port-A-Cath inserted without any complications. EMLA cream was given to the patient.  3. Antiemetics: He has Zofran available at home if needed.  4. Androgen depravation: This will continued under the care of Dr. Tresa [name].  5. Neutropenia prophylaxis: He will receive Neulasta after each injection of chemotherapy.  6. Follow-up: Will be in 3 weeks for cycle 5 of chemotherapy.   Carlton Adam, [place]  4/15/20164:06 PM

## 2014-07-09 NOTE — Patient Instructions (Signed)
[facility] Discharge Instructions for Patients Receiving Chemotherapy  Today you received the following chemotherapy agents Taxotere  To help prevent nausea and vomiting after your treatment, we encourage you to take your nausea medication as needed   If you develop nausea and vomiting that is not controlled by your nausea medication, call the clinic.   BELOW ARE SYMPTOMS THAT SHOULD BE REPORTED IMMEDIATELY:  *FEVER GREATER THAN 100.5 F  *CHILLS WITH OR WITHOUT FEVER  NAUSEA AND VOMITING THAT IS NOT CONTROLLED WITH YOUR NAUSEA MEDICATION  *UNUSUAL SHORTNESS OF BREATH  *UNUSUAL BRUISING OR BLEEDING  TENDERNESS IN MOUTH AND THROAT WITH OR WITHOUT PRESENCE OF ULCERS  *URINARY PROBLEMS  *BOWEL PROBLEMS  UNUSUAL RASH Items with * indicate a potential emergency and should be followed up as soon as possible.  Feel free to call the clinic you have any questions or concerns. The clinic phone number is [phone].  Please show the [place] at check-in to the Emergency Department and triage nurse.

## 2014-07-09 NOTE — Progress Notes (Signed)
Okay to tx without CMET today per Dr. [name].

## 2014-07-10 ENCOUNTER — Ambulatory Visit (HOSPITAL_BASED_OUTPATIENT_CLINIC_OR_DEPARTMENT_OTHER): Payer: Medicare Other

## 2014-07-10 DIAGNOSIS — C61 Malignant neoplasm of prostate: Secondary | ICD-10-CM | POA: Diagnosis present

## 2014-07-10 DIAGNOSIS — Z5189 Encounter for other specified aftercare: Secondary | ICD-10-CM | POA: Diagnosis not present

## 2014-07-10 LAB — PSA: PSA: 1.2 ng/mL (ref <=4.00)

## 2014-07-10 MED ORDER — PEGFILGRASTIM INJECTION 6 MG/0.6ML ~~LOC~~
6.0000 mg | PREFILLED_SYRINGE | Freq: Once | SUBCUTANEOUS | Status: AC
  Administered 2014-07-10: 6 mg via SUBCUTANEOUS

## 2014-07-11 NOTE — Patient Instructions (Signed)
Follow up in 3 weeks prior to your next scheduled cycle of chemotherapy

## 2014-07-13 ENCOUNTER — Encounter: Payer: Self-pay | Admitting: *Deleted

## 2014-07-13 NOTE — Progress Notes (Signed)
[place] Social Work  Clinical Social Work was referred by Therapist, sports to review and complete healthcare advance directives.  Clinical Social Worker met with patient and spouse in [place] office.  The patient designated spouse Clara as their primary healthcare agent and daughter Erasmo Downer as their secondary agent.  Patient also completed healthcare living will.    Clinical Social Worker notarized documents and made copies for patient/family. Clinical Social Worker will send documents to medical records to be scanned into patient's chart. Clinical Social Worker encouraged patient/family to contact with any additional questions or concerns.  Polo Riley, MSW, [place] Worker [facility] [phone]

## 2014-07-16 ENCOUNTER — Other Ambulatory Visit: Payer: Self-pay | Admitting: Oncology

## 2014-07-16 DIAGNOSIS — N393 Stress incontinence (female) (male): Secondary | ICD-10-CM | POA: Diagnosis not present

## 2014-07-16 DIAGNOSIS — C61 Malignant neoplasm of prostate: Secondary | ICD-10-CM | POA: Diagnosis not present

## 2014-07-16 DIAGNOSIS — C775 Secondary and unspecified malignant neoplasm of intrapelvic lymph nodes: Secondary | ICD-10-CM | POA: Diagnosis not present

## 2014-07-30 ENCOUNTER — Ambulatory Visit (HOSPITAL_BASED_OUTPATIENT_CLINIC_OR_DEPARTMENT_OTHER): Payer: Medicare Other

## 2014-07-30 ENCOUNTER — Other Ambulatory Visit: Payer: Self-pay | Admitting: Medical Oncology

## 2014-07-30 ENCOUNTER — Other Ambulatory Visit (HOSPITAL_BASED_OUTPATIENT_CLINIC_OR_DEPARTMENT_OTHER): Payer: Medicare Other

## 2014-07-30 ENCOUNTER — Ambulatory Visit (HOSPITAL_BASED_OUTPATIENT_CLINIC_OR_DEPARTMENT_OTHER): Payer: Medicare Other | Admitting: Oncology

## 2014-07-30 ENCOUNTER — Telehealth: Payer: Self-pay | Admitting: Oncology

## 2014-07-30 VITALS — BP 126/75 | HR 68 | Temp 97.5°F | Resp 17 | Ht 74.0 in | Wt 184.3 lb

## 2014-07-30 DIAGNOSIS — Z5111 Encounter for antineoplastic chemotherapy: Secondary | ICD-10-CM | POA: Diagnosis not present

## 2014-07-30 DIAGNOSIS — C61 Malignant neoplasm of prostate: Secondary | ICD-10-CM

## 2014-07-30 DIAGNOSIS — E291 Testicular hypofunction: Secondary | ICD-10-CM | POA: Diagnosis not present

## 2014-07-30 LAB — COMPREHENSIVE METABOLIC PANEL (CC13)
ALT: 14 U/L (ref 0–55)
AST: 10 U/L (ref 5–34)
Albumin: 4 g/dL (ref 3.5–5.0)
Alkaline Phosphatase: 82 U/L (ref 40–150)
Anion Gap: 12 mEq/L — ABNORMAL HIGH (ref 3–11)
BILIRUBIN TOTAL: 0.53 mg/dL (ref 0.20–1.20)
BUN: 17.1 mg/dL (ref 7.0–26.0)
CO2: 21 meq/L — AB (ref 22–29)
CREATININE: 0.7 mg/dL (ref 0.7–1.3)
Calcium: 9.5 mg/dL (ref 8.4–10.4)
Chloride: 112 mEq/L — ABNORMAL HIGH (ref 98–109)
EGFR: >90 mL/min/{1.73_m2} (ref >90)
Glucose: 101 mg/dl (ref 70–140)
Potassium: 4.3 mEq/L (ref 3.5–5.1)
Sodium: 145 mEq/L (ref 136–145)
Total Protein: 6.6 g/dL (ref 6.4–8.3)

## 2014-07-30 LAB — CBC WITH DIFFERENTIAL/PLATELET
BASO%: 1 % (ref 0.0–2.0)
Basophils Absolute: 0 10*3/uL (ref 0.0–0.1)
EOS%: 0.5 % (ref 0.0–7.0)
Eosinophils Absolute: 0 10*3/uL (ref 0.0–0.5)
HCT: 39.5 % (ref 38.4–49.9)
HGB: 13.4 g/dL (ref 13.0–17.1)
LYMPH%: 29.6 % (ref 14.0–49.0)
MCH: 30.5 pg (ref 27.2–33.4)
MCHC: 33.9 g/dL (ref 32.0–36.0)
MCV: 90 fL (ref 79.3–98.0)
MONO#: 0.5 10*3/uL (ref 0.1–0.9)
MONO%: 13 % (ref 0.0–14.0)
NEUT#: 2.1 10*3/uL (ref 1.5–6.5)
NEUT%: 55.9 % (ref 39.0–75.0)
Platelets: 228 10*3/uL (ref 140–400)
RBC: 4.39 10*6/uL (ref 4.20–5.82)
RDW: 16.2 % — ABNORMAL HIGH (ref 11.0–14.6)
WBC: 3.7 10*3/uL — AB (ref 4.0–10.3)
lymph#: 1.1 10*3/uL (ref 0.9–3.3)

## 2014-07-30 MED ORDER — SODIUM CHLORIDE 0.9 % IJ SOLN
10.0000 mL | INTRAMUSCULAR | Status: DC | PRN
  Administered 2014-07-30: 10 mL
  Filled 2014-07-30: qty 10

## 2014-07-30 MED ORDER — DOCETAXEL CHEMO INJECTION 160 MG/16ML
75.0000 mg/m2 | Freq: Once | INTRAVENOUS | Status: AC
  Administered 2014-07-30: 150 mg via INTRAVENOUS
  Filled 2014-07-30: qty 15

## 2014-07-30 MED ORDER — SODIUM CHLORIDE 0.9 % IV SOLN
Freq: Once | INTRAVENOUS | Status: AC
  Administered 2014-07-30: 10:00:00 via INTRAVENOUS

## 2014-07-30 MED ORDER — SODIUM CHLORIDE 0.9 % IV SOLN
Freq: Once | INTRAVENOUS | Status: AC
  Administered 2014-07-30: 10:00:00 via INTRAVENOUS
  Filled 2014-07-30: qty 4

## 2014-07-30 MED ORDER — HEPARIN SOD (PORK) LOCK FLUSH 100 UNIT/ML IV SOLN
500.0000 [IU] | Freq: Once | INTRAVENOUS | Status: AC | PRN
  Administered 2014-07-30: 500 [IU]
  Filled 2014-07-30: qty 5

## 2014-07-30 NOTE — Patient Instructions (Signed)
[facility] Discharge Instructions for Patients Receiving Chemotherapy  Today you received the following chemotherapy agents:  Taxotere.  To help prevent nausea and vomiting after your treatment, we encourage you to take your nausea medication as directed.   If you develop nausea and vomiting that is not controlled by your nausea medication, call the clinic.   BELOW ARE SYMPTOMS THAT SHOULD BE REPORTED IMMEDIATELY:  *FEVER GREATER THAN 100.5 F  *CHILLS WITH OR WITHOUT FEVER  NAUSEA AND VOMITING THAT IS NOT CONTROLLED WITH YOUR NAUSEA MEDICATION  *UNUSUAL SHORTNESS OF BREATH  *UNUSUAL BRUISING OR BLEEDING  TENDERNESS IN MOUTH AND THROAT WITH OR WITHOUT PRESENCE OF ULCERS  *URINARY PROBLEMS  *BOWEL PROBLEMS  UNUSUAL RASH Items with * indicate a potential emergency and should be followed up as soon as possible.  Feel free to call the clinic you have any questions or concerns. The clinic phone number is [phone].  Please show the CHEMO ALERT CARD at check-in to the Emergency Department and triage nurse.  

## 2014-07-30 NOTE — Progress Notes (Signed)
Hematology and Oncology Follow Up Visit  [name] [id number] 09-07-1945 69 y.o. 07/30/2014 9:28 AM   Principle Diagnosis: 69 year old gentleman with prostate cancer diagnosed in October 2015. He had a Gleason score 4+5 = 9 and a PSA of 116. Abdominal imaging showed bulky retroperitoneal lymphadenopathy. He does not have any bony involvement.   Prior Therapy: Status post prostate biopsy on 01/19/2014.  Current therapy:  Androgen deprivation under the care of Dr. [name]. Systemic chemotherapy in the form of Taxotere 75 mg/m every 3 weeks with plans for total of 6 cycles. Cycle 1 will be given on 05/05/2014. He is here for cycle 5.  Interim History:  [name] presents today for a follow-up visit with his wife. Since the last visit, he tolerated chemotherapy with very few complications. He did report slightly more persistent nausea. His nausea lasted about 2-3 days after chemotherapy. There is no vomiting and was able to eat and drink properly. He continues to be very active and exercises regularly. He continues to work full time. He denies having any peripheral neuropathy or cold sensitivity. He did not have any nail changes. 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 He does not report any headaches, blurry vision or syncope. He does not report any seizures or any neurological symptoms. He does not report any fevers, chills, weight loss, appetite changes or decline his energy. He does not report any chest pain, palpitation, orthopnea or leg edema. He does not report any cough, hemoptysis or hematemesis. He does not report any nausea, vomiting, abdominal pain, constipation or diarrhea. He does not report any frequency, urgency, hesitancy or nocturia. He does not report any incontinence or hematuria. He does not report any skeletal complaints of arthralgias myalgias. He does not report any early satiety or abdominal distention. Remainder of his review of systems unremarkable.    Medications: I have reviewed the patient's current medications.  Current Outpatient Prescriptions  Medication Sig Dispense Refill  . aspirin 81 MG tablet Take 81 mg by mouth daily.    . diphenhydrAMINE (BENADRYL) 25 mg capsule Take 25 mg by mouth every 6 (six) hours as needed.    . diphenhydramine-acetaminophen (TYLENOL PM) 25-500 MG TABS Take 1 tablet by mouth at bedtime as needed (for pain/sleep).     Marland Kitchen lidocaine-prilocaine (EMLA) cream Apply 1 application topically as needed. Apply to port on the day of chemotherapy. 30 g 0  . loratadine (CLARITIN) 10 MG tablet Take 10 mg by mouth daily. As needed for allergies    . meclizine (ANTIVERT) 25 MG tablet Take 25 mg by mouth 3 (three) times daily.  1  . ondansetron (ZOFRAN) 8 MG tablet TAKE 1 TABLET (8 MG TOTAL) BY MOUTH EVERY 8 (EIGHT) HOURS AS NEEDED FOR NAUSEA OR VOMITING. 20 tablet 0  . sodium chloride (OCEAN) 0.65 % SOLN nasal spray Place 1 spray into both nostrils as needed for congestion.     No current facility-administered medications for this visit.     Allergies: No Known Allergies  Past Medical History, Surgical history, Social history, and Family History were reviewed and updated.   Physical Exam: Blood pressure 126/75, pulse 68, temperature 97.5 F (36.4 C), temperature source Oral, resp. rate 17, height 6\' 2"  (1.88 m), weight 184 lb 4.8 oz (83.598 kg), SpO2 99 %. ECOG: 0 General appearance: alert and cooperative appeared in no distress. Head: Normocephalic, without obvious abnormality Neck: no adenopathy Lymph nodes: Cervical, supraclavicular, and axillary nodes normal. Heart:regular rate and rhythm,  S1, S2 normal, no murmur, click, rub or gallop Lung:chest clear, no wheezing, rales, normal symmetric air entry Abdomin: soft, non-tender, without masses or organomegaly EXT:no erythema, induration, or nodules Right anterior chest Port-A-Cath, non-tender well-healed incision.   Lab Results: Lab Results  Component  Value Date   WBC 3.7* 07/30/2014   HGB 13.4 07/30/2014   HCT 39.5 07/30/2014   MCV 90.0 07/30/2014   PLT 228 07/30/2014     Chemistry      Component Value Date/Time   NA 145 07/30/2014 0845   NA 143 07/09/2014 1153   K 4.3 07/30/2014 0845   K 4.7 07/09/2014 1153   CL 108 07/09/2014 1153   CO2 21* 07/30/2014 0845   CO2 27 07/09/2014 1153   BUN 17.1 07/30/2014 0845   BUN 19 07/09/2014 1153   CREATININE 0.7 07/30/2014 0845   CREATININE 0.77 07/09/2014 1153      Component Value Date/Time   CALCIUM 9.5 07/30/2014 0845   CALCIUM 9.7 07/09/2014 1153   ALKPHOS 82 07/30/2014 0845   ALKPHOS 80 07/09/2014 1153   AST 10 07/30/2014 0845   AST 14 07/09/2014 1153   ALT 14 07/30/2014 0845   ALT 14 07/09/2014 1153   BILITOT 0.53 07/30/2014 0845   BILITOT 0.2* 07/09/2014 1153      Results for [name] (MRN [id number]) as of 07/30/2014 09:29  Ref. Range 05/05/2014 09:17 05/26/2014 10:24 06/18/2014 09:48 07/09/2014 10:22  PSA Latest Ref Range: <=4.00 ng/mL 145.30 (H) 9.39 (H) 1.75 1.20     Impression and Plan:  69 year old gentleman with the following issues:  1. Advanced prostate cancer presenting with stage IV disease. His Gleason score is 4+5 = 9 with heavy volume disease involving 11 out of 12 cores of his biopsy obtained in October 2015. His PSA is 116 and his staging workup revealed lymphadenopathy in the periaortic and iliac chains. He started androgen depravation under the care of Dr. [name].  He is currently being treated with systemic chemotherapy in the form of Taxotere at 75 mg/m given every 3 weeks in addition to androgen depravation. He is status post 4 cycles of chemotherapy without any complications. He will proceed with cycle #5 without delay or dose reduction. A total of 6 cycles are planned.  2. IV access: Port-A-Cath inserted without any complications. EMLA cream was given to the patient.  3. Antiemetics: He has Zofran available at home if needed. I have instructed  him to use around the clock Zofran 24-48 hours from his chemotherapy to prevent any delayed nausea.  4. Androgen depravation: This will continued under the care of Dr. [name].  5. Neutropenia prophylaxis: He will receive Neulasta after each injection of chemotherapy.  6. Follow-up: Will be in 3 weeks for cycle 6 of chemotherapy.   [facility], MD 5/6/20169:28 AM

## 2014-07-30 NOTE — Telephone Encounter (Signed)
Gave avs & calendar for May/July. Sent message to schedule treatment.

## 2014-07-31 ENCOUNTER — Ambulatory Visit (HOSPITAL_BASED_OUTPATIENT_CLINIC_OR_DEPARTMENT_OTHER): Payer: Medicare Other

## 2014-07-31 VITALS — BP 138/76 | HR 79 | Temp 97.6°F | Resp 18

## 2014-07-31 DIAGNOSIS — Z5189 Encounter for other specified aftercare: Secondary | ICD-10-CM

## 2014-07-31 DIAGNOSIS — C61 Malignant neoplasm of prostate: Secondary | ICD-10-CM

## 2014-07-31 LAB — PSA: PSA: 0.65 ng/mL (ref <=4.00)

## 2014-07-31 MED ORDER — PEGFILGRASTIM INJECTION 6 MG/0.6ML ~~LOC~~
6.0000 mg | PREFILLED_SYRINGE | Freq: Once | SUBCUTANEOUS | Status: AC
  Administered 2014-07-31: 6 mg via SUBCUTANEOUS

## 2014-07-31 NOTE — Patient Instructions (Signed)
[facility] Discharge Instructions for Patients Receiving Chemotherapy  Today you received the following chemotherapy agents taxotere To help prevent nausea and vomiting after your treatment, we encourage you to take your nausea medication as prescribed.   If you develop nausea and vomiting that is not controlled by your nausea medication, call the clinic.   BELOW ARE SYMPTOMS THAT SHOULD BE REPORTED IMMEDIATELY:  *FEVER GREATER THAN 100.5 F  *CHILLS WITH OR WITHOUT FEVER  NAUSEA AND VOMITING THAT IS NOT CONTROLLED WITH YOUR NAUSEA MEDICATION  *UNUSUAL SHORTNESS OF BREATH  *UNUSUAL BRUISING OR BLEEDING  TENDERNESS IN MOUTH AND THROAT WITH OR WITHOUT PRESENCE OF ULCERS  *URINARY PROBLEMS  *BOWEL PROBLEMS  UNUSUAL RASH Items with * indicate a potential emergency and should be followed up as soon as possible.  Feel free to call the clinic you have any questions or concerns. The clinic phone number is [phone].  Please show the [place] at check-in to the Emergency Department and triage nurse.

## 2014-08-02 ENCOUNTER — Telehealth: Payer: Self-pay | Admitting: *Deleted

## 2014-08-02 NOTE — Telephone Encounter (Signed)
Per Dr. [name], I called and left a message on patient's cell phone regarding PSA results. Instructed patient to call [facility] if he had any questions.

## 2014-08-02 NOTE — Telephone Encounter (Signed)
Left message on voice mail for patient to call dr Hazeline Junker desk nurse.

## 2014-08-02 NOTE — Telephone Encounter (Signed)
-----   Message from Wyatt Portela, MD sent at 08/02/2014  8:14 AM EDT ----- Please call his PSA

## 2014-08-02 NOTE — Progress Notes (Signed)
Spoke with patient, gave results of last PSA. Per dr Hazeline Junker request.

## 2014-08-20 ENCOUNTER — Ambulatory Visit (HOSPITAL_BASED_OUTPATIENT_CLINIC_OR_DEPARTMENT_OTHER): Payer: Medicare Other

## 2014-08-20 ENCOUNTER — Ambulatory Visit (HOSPITAL_BASED_OUTPATIENT_CLINIC_OR_DEPARTMENT_OTHER): Payer: Medicare Other | Admitting: Physician Assistant

## 2014-08-20 ENCOUNTER — Encounter: Payer: Self-pay | Admitting: Physician Assistant

## 2014-08-20 ENCOUNTER — Other Ambulatory Visit (HOSPITAL_BASED_OUTPATIENT_CLINIC_OR_DEPARTMENT_OTHER): Payer: Medicare Other

## 2014-08-20 VITALS — BP 140/83 | HR 73 | Temp 98.0°F | Resp 18 | Ht 74.0 in | Wt 184.4 lb

## 2014-08-20 DIAGNOSIS — C61 Malignant neoplasm of prostate: Secondary | ICD-10-CM | POA: Diagnosis not present

## 2014-08-20 DIAGNOSIS — C779 Secondary and unspecified malignant neoplasm of lymph node, unspecified: Secondary | ICD-10-CM

## 2014-08-20 DIAGNOSIS — Z5111 Encounter for antineoplastic chemotherapy: Secondary | ICD-10-CM | POA: Diagnosis not present

## 2014-08-20 LAB — CBC WITH DIFFERENTIAL/PLATELET
BASO%: 1.1 % (ref 0.0–2.0)
BASOS ABS: 0 10*3/uL (ref 0.0–0.1)
EOS ABS: 0 10*3/uL (ref 0.0–0.5)
EOS%: 0.3 % (ref 0.0–7.0)
HEMATOCRIT: 38.8 % (ref 38.4–49.9)
HEMOGLOBIN: 13 g/dL (ref 13.0–17.1)
LYMPH#: 1.1 10*3/uL (ref 0.9–3.3)
LYMPH%: 30.4 % (ref 14.0–49.0)
MCH: 31.2 pg (ref 27.2–33.4)
MCHC: 33.5 g/dL (ref 32.0–36.0)
MCV: 93 fL (ref 79.3–98.0)
MONO#: 0.6 10*3/uL (ref 0.1–0.9)
MONO%: 16.6 % — AB (ref 0.0–14.0)
NEUT%: 51.6 % (ref 39.0–75.0)
NEUTROS ABS: 1.9 10*3/uL (ref 1.5–6.5)
PLATELETS: 216 10*3/uL (ref 140–400)
RBC: 4.17 10*6/uL — ABNORMAL LOW (ref 4.20–5.82)
RDW: 15.4 % — ABNORMAL HIGH (ref 11.0–14.6)
WBC: 3.7 10*3/uL — ABNORMAL LOW (ref 4.0–10.3)

## 2014-08-20 LAB — COMPREHENSIVE METABOLIC PANEL (CC13)
ALBUMIN: 4 g/dL (ref 3.5–5.0)
ALK PHOS: 85 U/L (ref 40–150)
ALT: 11 U/L (ref 0–55)
AST: 12 U/L (ref 5–34)
Anion Gap: 9 mEq/L (ref 3–11)
BUN: 18.3 mg/dL (ref 7.0–26.0)
CALCIUM: 9.3 mg/dL (ref 8.4–10.4)
CHLORIDE: 112 meq/L — AB (ref 98–109)
CO2: 24 mEq/L (ref 22–29)
Creatinine: 0.8 mg/dL (ref 0.7–1.3)
EGFR: >90 mL/min/{1.73_m2} (ref >90)
GLUCOSE: 90 mg/dL (ref 70–140)
POTASSIUM: 4.3 meq/L (ref 3.5–5.1)
SODIUM: 145 meq/L (ref 136–145)
Total Bilirubin: 0.35 mg/dL (ref 0.20–1.20)
Total Protein: 6.5 g/dL (ref 6.4–8.3)

## 2014-08-20 MED ORDER — HEPARIN SOD (PORK) LOCK FLUSH 100 UNIT/ML IV SOLN
500.0000 [IU] | Freq: Once | INTRAVENOUS | Status: AC | PRN
  Administered 2014-08-20: 500 [IU]
  Filled 2014-08-20: qty 5

## 2014-08-20 MED ORDER — SODIUM CHLORIDE 0.9 % IV SOLN
Freq: Once | INTRAVENOUS | Status: AC
  Administered 2014-08-20: 10:00:00 via INTRAVENOUS

## 2014-08-20 MED ORDER — SODIUM CHLORIDE 0.9 % IJ SOLN
10.0000 mL | INTRAMUSCULAR | Status: DC | PRN
  Administered 2014-08-20: 10 mL
  Filled 2014-08-20: qty 10

## 2014-08-20 MED ORDER — DOCETAXEL CHEMO INJECTION 160 MG/16ML
75.0000 mg/m2 | Freq: Once | INTRAVENOUS | Status: AC
  Administered 2014-08-20: 150 mg via INTRAVENOUS
  Filled 2014-08-20: qty 15

## 2014-08-20 MED ORDER — SODIUM CHLORIDE 0.9 % IV SOLN
Freq: Once | INTRAVENOUS | Status: AC
  Administered 2014-08-20: 10:00:00 via INTRAVENOUS
  Filled 2014-08-20: qty 4

## 2014-08-20 NOTE — Progress Notes (Signed)
Hematology and Oncology Follow Up Visit  [name] [id number] 10-19-1945 69 y.o. 08/20/2014 3:25 PM   Principle Diagnosis: 69 year old gentleman with prostate cancer diagnosed in October 2015. He had a Gleason score 4+5 = 9 and a PSA of 116. Abdominal imaging showed bulky retroperitoneal lymphadenopathy. He does not have any bony involvement.   Prior Therapy: Status post prostate biopsy on 01/19/2014.  Current therapy:  Androgen deprivation under the care of Dr. [name]. Systemic chemotherapy in the form of Taxotere 75 mg/m every 3 weeks with plans for total of 6 cycles. Cycle 1 will be given on 05/05/2014. He is here for cycle 6.  Interim History:  [name] presents today for a follow-up visit with his wife. Since the last visit, he tolerated chemotherapy with very few complications. He did report slightly more persistent nausea. His nausea lasted about 2-3 days after chemotherapy. There is no vomiting and was able to eat and drink properly. He continues to be very active and exercises regularly. He continues to work full time. He denies having any peripheral neuropathy or cold sensitivity. He did not have any nail changes. 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 He reports tearing of the eyes and clear runny nose. He does not report any headaches, blurry vision or syncope. He does not report any seizures or any neurological symptoms. He does not report any fevers, chills, weight loss, appetite changes or decline his energy. He does not report any chest pain, palpitation, orthopnea or leg edema. He does not report any cough, hemoptysis or hematemesis. He does not report any nausea, vomiting, abdominal pain, constipation or diarrhea. He does not report any frequency, urgency, hesitancy or nocturia. He does not report any incontinence or hematuria. He does not report any skeletal complaints of arthralgias myalgias. He does not report any early satiety or abdominal  distention. Remainder of his review of systems unremarkable.   Medications: I have reviewed the patient's current medications.  Current Outpatient Prescriptions  Medication Sig Dispense Refill  . aspirin 81 MG tablet Take 81 mg by mouth daily.    . diphenhydrAMINE (BENADRYL) 25 mg capsule Take 25 mg by mouth every 6 (six) hours as needed.    . diphenhydramine-acetaminophen (TYLENOL PM) 25-500 MG TABS Take 1 tablet by mouth at bedtime as needed (for pain/sleep).     Marland Kitchen lidocaine-prilocaine (EMLA) cream Apply 1 application topically as needed. Apply to port on the day of chemotherapy. 30 g 0  . loratadine (CLARITIN) 10 MG tablet Take 10 mg by mouth daily. As needed for allergies    . meclizine (ANTIVERT) 25 MG tablet Take 25 mg by mouth 3 (three) times daily.  1  . ondansetron (ZOFRAN) 8 MG tablet TAKE 1 TABLET (8 MG TOTAL) BY MOUTH EVERY 8 (EIGHT) HOURS AS NEEDED FOR NAUSEA OR VOMITING. 20 tablet 0  . sodium chloride (OCEAN) 0.65 % SOLN nasal spray Place 1 spray into both nostrils as needed for congestion.     No current facility-administered medications for this visit.   Facility-Administered Medications Ordered in Other Visits  Medication Dose Route Frequency Provider Last Rate Last Dose  . sodium chloride 0.9 % injection 10 mL  10 mL Intracatheter PRN Wyatt Portela, MD   10 mL at 08/20/14 1140     Allergies: No Known Allergies  Past Medical History, Surgical history, Social history, and Family History were reviewed and updated.   Physical Exam: Blood pressure 140/83, pulse 73, temperature 98 F (36.7  C), temperature source Oral, resp. rate 18, height 6\' 2"  (1.88 m), weight 184 lb 6.4 oz (83.643 kg), SpO2 100 %. ECOG: 0 General appearance: alert and cooperative appeared in no distress. Head: Normocephalic, without obvious abnormality Neck: no adenopathy Lymph nodes: Cervical, supraclavicular, and axillary nodes normal. Heart:regular rate and rhythm, S1, S2 normal, no murmur,  click, rub or gallop Lung:chest clear, no wheezing, rales, normal symmetric air entry Abdomin: soft, non-tender, without masses or organomegaly EXT:no erythema, induration, or nodules Right anterior chest Port-A-Cath, non-tender well-healed incision.   Lab Results: Lab Results  Component Value Date   WBC 3.7* 08/20/2014   HGB 13.0 08/20/2014   HCT 38.8 08/20/2014   MCV 93.0 08/20/2014   PLT 216 08/20/2014     Chemistry      Component Value Date/Time   NA 145 08/20/2014 0840   NA 143 07/09/2014 1153   K 4.3 08/20/2014 0840   K 4.7 07/09/2014 1153   CL 108 07/09/2014 1153   CO2 24 08/20/2014 0840   CO2 27 07/09/2014 1153   BUN 18.3 08/20/2014 0840   BUN 19 07/09/2014 1153   CREATININE 0.8 08/20/2014 0840   CREATININE 0.77 07/09/2014 1153      Component Value Date/Time   CALCIUM 9.3 08/20/2014 0840   CALCIUM 9.7 07/09/2014 1153   ALKPHOS 85 08/20/2014 0840   ALKPHOS 80 07/09/2014 1153   AST 12 08/20/2014 0840   AST 14 07/09/2014 1153   ALT 11 08/20/2014 0840   ALT 14 07/09/2014 1153   BILITOT 0.35 08/20/2014 0840   BILITOT 0.2* 07/09/2014 1153      Results for Stroud, [name] (MRN [id number]) as of 07/30/2014 09:29  Ref. Range 05/05/2014 09:17 05/26/2014 10:24 06/18/2014 09:48 07/09/2014 10:22  PSA Latest Ref Range: <=4.00 ng/mL 145.30 (H) 9.39 (H) 1.75 1.20     Impression and Plan:  69 year old gentleman with the following issues:  1. Advanced prostate cancer presenting with stage IV disease. His Gleason score is 4+5 = 9 with heavy volume disease involving 11 out of 12 cores of his biopsy obtained in October 2015. His PSA is 116 and his staging workup revealed lymphadenopathy in the periaortic and iliac chains. He started androgen depravation under the care of Dr. [name].  He is currently being treated with systemic chemotherapy in the form of Taxotere at 75 mg/m given every 3 weeks in addition to androgen depravation. He is status post 5 cycles of chemotherapy without  any complications. He will proceed with cycle #6 without delay or dose reduction. A total of 6 cycles are planned.  2. IV access: Port-A-Cath inserted without any complications. EMLA cream was given to the patient.  3. Antiemetics: He has Zofran available at home if needed. I have instructed him to use around the clock Zofran 24-48 hours from his chemotherapy to prevent any delayed nausea.  4. Androgen depravation: This will continued under the care of Dr. [name].  5. Neutropenia prophylaxis: He will receive Neulasta after each injection of chemotherapy.  6. The excessive tearing an runny nose are likely related to the chemotherapy  7. Follow-up: Will be in 6 weeks    Awilda Metro E,PA-C  5/27/20163:25 PM

## 2014-08-20 NOTE — Patient Instructions (Signed)
[facility] Discharge Instructions for Patients Receiving Chemotherapy  Today you received the following chemotherapy agents taxotere To help prevent nausea and vomiting after your treatment, we encourage you to take your nausea medication as prescribed.   If you develop nausea and vomiting that is not controlled by your nausea medication, call the clinic.   BELOW ARE SYMPTOMS THAT SHOULD BE REPORTED IMMEDIATELY:  *FEVER GREATER THAN 100.5 F  *CHILLS WITH OR WITHOUT FEVER  NAUSEA AND VOMITING THAT IS NOT CONTROLLED WITH YOUR NAUSEA MEDICATION  *UNUSUAL SHORTNESS OF BREATH  *UNUSUAL BRUISING OR BLEEDING  TENDERNESS IN MOUTH AND THROAT WITH OR WITHOUT PRESENCE OF ULCERS  *URINARY PROBLEMS  *BOWEL PROBLEMS  UNUSUAL RASH Items with * indicate a potential emergency and should be followed up as soon as possible.  Feel free to call the clinic you have any questions or concerns. The clinic phone number is [phone].  Please show the [facility] at check-in to the Emergency Department and triage nurse.

## 2014-08-21 ENCOUNTER — Ambulatory Visit (HOSPITAL_BASED_OUTPATIENT_CLINIC_OR_DEPARTMENT_OTHER): Payer: Medicare Other

## 2014-08-21 VITALS — BP 131/68 | HR 74 | Temp 97.3°F | Resp 18

## 2014-08-21 DIAGNOSIS — Z5189 Encounter for other specified aftercare: Secondary | ICD-10-CM | POA: Diagnosis not present

## 2014-08-21 DIAGNOSIS — C61 Malignant neoplasm of prostate: Secondary | ICD-10-CM | POA: Diagnosis present

## 2014-08-21 LAB — PSA: PSA: 0.51 ng/mL (ref <=4.00)

## 2014-08-21 MED ORDER — PEGFILGRASTIM INJECTION 6 MG/0.6ML ~~LOC~~
6.0000 mg | PREFILLED_SYRINGE | Freq: Once | SUBCUTANEOUS | Status: AC
  Administered 2014-08-21: 6 mg via SUBCUTANEOUS

## 2014-08-21 NOTE — Patient Instructions (Signed)
Pegfilgrastim injection What is this medicine? PEGFILGRASTIM (peg fil GRA stim) is a long-acting granulocyte colony-stimulating factor that stimulates the growth of neutrophils, a type of white blood cell important in the body's fight against infection. It is used to reduce the incidence of fever and infection in patients with certain types of cancer who are receiving chemotherapy that affects the bone marrow. This medicine may be used for other purposes; ask your health care provider or pharmacist if you have questions. COMMON BRAND NAME(S): Neulasta What should I tell my health care provider before I take this medicine? They need to know if you have any of these conditions: -latex allergy -ongoing radiation therapy -sickle cell disease -skin reactions to acrylic adhesives (On-Body Injector only) -an unusual or allergic reaction to pegfilgrastim, filgrastim, other medicines, foods, dyes, or preservatives -pregnant or trying to get pregnant -breast-feeding How should I use this medicine? This medicine is for injection under the skin. If you get this medicine at home, you will be taught how to prepare and give the pre-filled syringe or how to use the On-body Injector. Refer to the patient Instructions for Use for detailed instructions. Use exactly as directed. Take your medicine at regular intervals. Do not take your medicine more often than directed. It is important that you put your used needles and syringes in a special sharps container. Do not put them in a trash can. If you do not have a sharps container, call your pharmacist or healthcare provider to get one. Talk to your pediatrician regarding the use of this medicine in children. Special care may be needed. Overdosage: If you think you have taken too much of this medicine contact a poison control center or emergency room at once. NOTE: This medicine is only for you. Do not share this medicine with others. What if I miss a dose? It is  important not to miss your dose. Call your doctor or health care professional if you miss your dose. If you miss a dose due to an On-body Injector failure or leakage, a new dose should be administered as soon as possible using a single prefilled syringe for manual use. What may interact with this medicine? Interactions have not been studied. Give your health care provider a list of all the medicines, herbs, non-prescription drugs, or dietary supplements you use. Also tell them if you smoke, drink alcohol, or use illegal drugs. Some items may interact with your medicine. This list may not describe all possible interactions. Give your health care provider a list of all the medicines, herbs, non-prescription drugs, or dietary supplements you use. Also tell them if you smoke, drink alcohol, or use illegal drugs. Some items may interact with your medicine. What should I watch for while using this medicine? You may need blood work done while you are taking this medicine. If you are going to need a MRI, CT scan, or other procedure, tell your doctor that you are using this medicine (On-Body Injector only). What side effects may I notice from receiving this medicine? Side effects that you should report to your doctor or health care professional as soon as possible: -allergic reactions like skin rash, itching or hives, swelling of the face, lips, or tongue -dizziness -fever -pain, redness, or irritation at site where injected -pinpoint red spots on the skin -shortness of breath or breathing problems -stomach or side pain, or pain at the shoulder -swelling -tiredness -trouble passing urine Side effects that usually do not require medical attention (report to your doctor   or health care professional if they continue or are bothersome): -bone pain -muscle pain This list may not describe all possible side effects. Call your doctor for medical advice about side effects. You may report side effects to FDA at  1-800-FDA-1088. Where should I keep my medicine? Keep out of the reach of children. Store pre-filled syringes in a refrigerator between 2 and 8 degrees C (36 and 46 degrees F). Do not freeze. Keep in carton to protect from light. Throw away this medicine if it is left out of the refrigerator for more than 48 hours. Throw away any unused medicine after the expiration date. NOTE: This sheet is a summary. It may not cover all possible information. If you have questions about this medicine, talk to your doctor, pharmacist, or health care provider.  2015, Elsevier/Gold Standard. (2013-06-11 16:14:05)  

## 2014-08-23 NOTE — Patient Instructions (Signed)
Follow up as previously scheduled with Dr. [name]

## 2014-09-13 ENCOUNTER — Telehealth: Payer: Self-pay | Admitting: *Deleted

## 2014-09-13 NOTE — Telephone Encounter (Signed)
TC from pt stating he is experiencing increased watery eyes after his last Taxotere dose (08/20/14). He had a little bit after the chemo before that but it is much more pronounced this time. He has been using saline eye drops. Denies headaches, fever or purulent drainage. Next appt with Dr. [name] is 09/30/14 Please advise.

## 2014-09-15 NOTE — Telephone Encounter (Signed)
This RN spoke with patient regarding watery eyes after his last chemotherapy treatment. I informed patient that watery eyes is a side effect of Taxotere and that it should get better. Patient stated,"my eyes have gotten better this week. I don't have to carry a tissue all day long." Instructed patient to call the office if their is no improvement before he sees Dr. [name] on 09/30/14. Patient verbalized understanding.

## 2014-09-30 ENCOUNTER — Ambulatory Visit (HOSPITAL_BASED_OUTPATIENT_CLINIC_OR_DEPARTMENT_OTHER): Payer: Medicare Other | Admitting: Oncology

## 2014-09-30 ENCOUNTER — Other Ambulatory Visit: Payer: Self-pay | Admitting: Radiology

## 2014-09-30 ENCOUNTER — Telehealth: Payer: Self-pay | Admitting: Oncology

## 2014-09-30 ENCOUNTER — Other Ambulatory Visit (HOSPITAL_BASED_OUTPATIENT_CLINIC_OR_DEPARTMENT_OTHER): Payer: Medicare Other

## 2014-09-30 VITALS — BP 143/75 | HR 71 | Temp 97.9°F | Resp 18 | Ht 74.0 in | Wt 181.0 lb

## 2014-09-30 DIAGNOSIS — C61 Malignant neoplasm of prostate: Secondary | ICD-10-CM

## 2014-09-30 DIAGNOSIS — R591 Generalized enlarged lymph nodes: Secondary | ICD-10-CM

## 2014-09-30 LAB — CBC WITH DIFFERENTIAL/PLATELET
BASO%: 0.2 % (ref 0.0–2.0)
BASOS ABS: 0 10*3/uL (ref 0.0–0.1)
EOS%: 3.9 % (ref 0.0–7.0)
Eosinophils Absolute: 0.2 10*3/uL (ref 0.0–0.5)
HEMATOCRIT: 41.8 % (ref 38.4–49.9)
HEMOGLOBIN: 14 g/dL (ref 13.0–17.1)
LYMPH#: 1.3 10*3/uL (ref 0.9–3.3)
LYMPH%: 30.4 % (ref 14.0–49.0)
MCH: 30.6 pg (ref 27.2–33.4)
MCHC: 33.5 g/dL (ref 32.0–36.0)
MCV: 91.3 fL (ref 79.3–98.0)
MONO#: 0.4 10*3/uL (ref 0.1–0.9)
MONO%: 8.5 % (ref 0.0–14.0)
NEUT%: 57 % (ref 39.0–75.0)
NEUTROS ABS: 2.3 10*3/uL (ref 1.5–6.5)
Platelets: 154 10*3/uL (ref 140–400)
RBC: 4.58 10*6/uL (ref 4.20–5.82)
RDW: 13 % (ref 11.0–14.6)
WBC: 4.1 10*3/uL (ref 4.0–10.3)

## 2014-09-30 LAB — COMPREHENSIVE METABOLIC PANEL (CC13)
ALK PHOS: 72 U/L (ref 40–150)
ALT: 14 U/L (ref 0–55)
AST: 14 U/L (ref 5–34)
Albumin: 4.2 g/dL (ref 3.5–5.0)
Anion Gap: 9 mEq/L (ref 3–11)
BUN: 17 mg/dL (ref 7.0–26.0)
CO2: 24 meq/L (ref 22–29)
Calcium: 10.1 mg/dL (ref 8.4–10.4)
Chloride: 111 mEq/L — ABNORMAL HIGH (ref 98–109)
Creatinine: 0.8 mg/dL (ref 0.7–1.3)
EGFR: >90 mL/min/{1.73_m2} (ref >90)
Glucose: 90 mg/dl (ref 70–140)
Potassium: 4.6 mEq/L (ref 3.5–5.1)
Sodium: 144 mEq/L (ref 136–145)
TOTAL PROTEIN: 6.8 g/dL (ref 6.4–8.3)
Total Bilirubin: 0.37 mg/dL (ref 0.20–1.20)

## 2014-09-30 NOTE — Progress Notes (Signed)
Hematology and Oncology Follow Up Visit  [name] [id number] Nov 22, 1945 69 y.o. 09/30/2014 10:31 AM   Principle Diagnosis: 69 year old gentleman with prostate cancer diagnosed in October 2015. He had a Gleason score 4+5 = 9 and a PSA of 116. Abdominal imaging showed bulky retroperitoneal lymphadenopathy. He does not have any bony involvement.   Prior Therapy:  Status post prostate biopsy on 01/19/2014. Systemic chemotherapy in the form of Taxotere 75 mg/m every 3 weeks with plans for total of 6 cycles. Cycle 1 given on 05/05/2014. He is status post 6 cycles of therapy concluded on 08/20/2014.  Current therapy:  Androgen deprivation under the care of Dr. [name].   Interim History:  Mr. Armwood presents today for a follow-up visit with his wife. Since the last visit, he tolerated chemotherapy with well. He does not report any delayed complications at this time. 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 This has improved dramatically since discontinuation of chemotherapy. There is no vomiting and was able to eat and drink properly. He continues to be very active and exercises regularly. He continues to work full time. He denies having any peripheral neuropathy or cold sensitivity. He did not have any nail changes.   He does not report any headaches, blurry vision or syncope. He does not report any seizures or any neurological symptoms. He does not report any fevers, chills, weight loss, appetite changes or decline his energy. He does not report any chest pain, palpitation, orthopnea or leg edema. He does not report any cough, hemoptysis or hematemesis. He does not report any nausea, vomiting, abdominal pain, constipation or diarrhea. He does not report any frequency, urgency, hesitancy or nocturia. He does not report any incontinence or hematuria. He does not report any skeletal complaints of arthralgias myalgias. He does not report any early satiety  or abdominal distention. Remainder of his review of systems unremarkable.   Medications: I have reviewed the patient's current medications.  Current Outpatient Prescriptions  Medication Sig Dispense Refill  . aspirin 81 MG tablet Take 81 mg by mouth daily.    . diphenhydrAMINE (BENADRYL) 25 mg capsule Take 25 mg by mouth every 6 (six) hours as needed.    . diphenhydramine-acetaminophen (TYLENOL PM) 25-500 MG TABS Take 1 tablet by mouth at bedtime as needed (for pain/sleep).     Marland Kitchen lidocaine-prilocaine (EMLA) cream Apply 1 application topically as needed. Apply to port on the day of chemotherapy. 30 g 0  . loratadine (CLARITIN) 10 MG tablet Take 10 mg by mouth daily. As needed for allergies    . meclizine (ANTIVERT) 25 MG tablet Take 25 mg by mouth 3 (three) times daily.  1  . ondansetron (ZOFRAN) 8 MG tablet TAKE 1 TABLET (8 MG TOTAL) BY MOUTH EVERY 8 (EIGHT) HOURS AS NEEDED FOR NAUSEA OR VOMITING. 20 tablet 0  . sodium chloride (OCEAN) 0.65 % SOLN nasal spray Place 1 spray into both nostrils as needed for congestion.     No current facility-administered medications for this visit.     Allergies: No Known Allergies  Past Medical History, Surgical history, Social history, and Family History were reviewed and updated.   Physical Exam: Blood pressure 143/75, pulse 71, temperature 97.9 F (36.6 C), temperature source Oral, resp. rate 18, height 6\' 2"  (1.88 m), weight 181 lb (82.101 kg), SpO2 100 %. ECOG: 0 General appearance: alert and cooperative well-appearing gentleman. Head: Normocephalic, without obvious abnormality Neck: no adenopathy Lymph nodes: Cervical, supraclavicular, and  axillary nodes normal. Heart:regular rate and rhythm, S1, S2 normal, no murmur, click, rub or gallop Lung:chest clear, no wheezing, rales, normal symmetric air entry Abdomin: soft, non-tender, without masses or organomegaly EXT:no erythema, induration, or nodules Right anterior chest Port-A-Cath, non-tender  well-healed incision.   Lab Results: Lab Results  Component Value Date   WBC 4.1 09/30/2014   HGB 14.0 09/30/2014   HCT 41.8 09/30/2014   MCV 91.3 09/30/2014   PLT 154 09/30/2014     Chemistry      Component Value Date/Time   NA 145 08/20/2014 0840   NA 143 07/09/2014 1153   K 4.3 08/20/2014 0840   K 4.7 07/09/2014 1153   CL 108 07/09/2014 1153   CO2 24 08/20/2014 0840   CO2 27 07/09/2014 1153   BUN 18.3 08/20/2014 0840   BUN 19 07/09/2014 1153   CREATININE 0.8 08/20/2014 0840   CREATININE 0.77 07/09/2014 1153      Component Value Date/Time   CALCIUM 9.3 08/20/2014 0840   CALCIUM 9.7 07/09/2014 1153   ALKPHOS 85 08/20/2014 0840   ALKPHOS 80 07/09/2014 1153   AST 12 08/20/2014 0840   AST 14 07/09/2014 1153   ALT 11 08/20/2014 0840   ALT 14 07/09/2014 1153   BILITOT 0.35 08/20/2014 0840   BILITOT 0.2* 07/09/2014 1153      Results for Scantlin, JOHNMATTHEW SOLORIO (MRN [id number]) as of 09/30/2014 10:03  Ref. Range 05/05/2014 09:17 05/26/2014 10:24 06/18/2014 09:48 07/09/2014 10:22 07/30/2014 08:45 08/20/2014 08:40  PSA Latest Ref Range: <=4.00 ng/mL 145.30 (H) 9.39 (H) 1.75 1.20 0.65 0.51      Impression and Plan:  69 year old gentleman with the following issues:  1. Advanced prostate cancer presenting with stage IV disease. His Gleason score is 4+5 = 9 with heavy volume disease involving 11 out of 12 cores of his biopsy obtained in October 2015. His PSA is 116 and his staging workup revealed lymphadenopathy in the periaortic and iliac chains. He started androgen depravation under the care of Dr. [name].  He is status post 6 cycles of chemotherapy in addition to an vision depravation. His PSA have responded nicely down to 0.51.  The plan is continue with observation and surveillance and second line hormonal therapy with PSA relapse.   2. IV access: Port-A-Cath will be removed based on the patient's request. Risks and benefits of this procedure was discussed and we will ask interventional  radiology kindly to that in the near future. He understands he might need to have this inserted in the future if needed to.  3. Antiemetics: Nausea subsided at this time and no longer an issue.  4. Androgen depravation: This will continued under the care of Dr. [name].  5. Neutropenia prophylaxis: No further prophylaxis as needed upon completion of chemotherapy.  6. 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 We can consider referral to ophthalmology it becomes an issue in the future.  7. Follow-up: Will be in 3 months sooner if needed to.   SPQZRA,QTMAU,QJ-F  7/7/201610:31 AM

## 2014-09-30 NOTE — Telephone Encounter (Signed)
Pt confirmed labs/ov per 07/07 POF, gave pt AVS and Calendar... KJ, set apt for port removal..Marland Kitchen

## 2014-10-01 ENCOUNTER — Other Ambulatory Visit: Payer: Self-pay | Admitting: Radiology

## 2014-10-01 LAB — PSA: PSA: 0.39 ng/mL (ref <=4.00)

## 2014-10-04 ENCOUNTER — Ambulatory Visit (HOSPITAL_COMMUNITY)
Admission: RE | Admit: 2014-10-04 | Discharge: 2014-10-04 | Disposition: A | Discharge status: Home / Self Care | Payer: Medicare Other | Source: Ambulatory Visit | Attending: Oncology | Admitting: Oncology

## 2014-10-04 DIAGNOSIS — Z452 Encounter for adjustment and management of vascular access device: Secondary | ICD-10-CM | POA: Insufficient documentation

## 2014-10-04 DIAGNOSIS — Z8546 Personal history of malignant neoplasm of prostate: Secondary | ICD-10-CM | POA: Insufficient documentation

## 2014-10-04 DIAGNOSIS — Z9221 Personal history of antineoplastic chemotherapy: Secondary | ICD-10-CM | POA: Diagnosis not present

## 2014-10-04 DIAGNOSIS — C61 Malignant neoplasm of prostate: Secondary | ICD-10-CM | POA: Diagnosis not present

## 2014-10-04 DIAGNOSIS — Z7982 Long term (current) use of aspirin: Secondary | ICD-10-CM | POA: Diagnosis not present

## 2014-10-04 LAB — CBC WITH DIFFERENTIAL/PLATELET
BASOS PCT: 1 % (ref 0–1)
Basophils Absolute: 0 10*3/uL (ref 0.0–0.1)
EOS PCT: 4 % (ref 0–5)
Eosinophils Absolute: 0.1 10*3/uL (ref 0.0–0.7)
HEMATOCRIT: 40.9 % (ref 39.0–52.0)
Hemoglobin: 13.4 g/dL (ref 13.0–17.0)
Lymphocytes Relative: 40 % (ref 12–46)
Lymphs Abs: 1.3 10*3/uL (ref 0.7–4.0)
MCH: 30.1 pg (ref 26.0–34.0)
MCHC: 32.8 g/dL (ref 30.0–36.0)
MCV: 91.9 fL (ref 78.0–100.0)
MONO ABS: 0.2 10*3/uL (ref 0.1–1.0)
Monocytes Relative: 6 % (ref 3–12)
Neutro Abs: 1.5 10*3/uL — ABNORMAL LOW (ref 1.7–7.7)
Neutrophils Relative %: 49 % (ref 43–77)
Platelets: 138 10*3/uL — ABNORMAL LOW (ref 150–400)
RBC: 4.45 MIL/uL (ref 4.22–5.81)
RDW: 12.7 % (ref 11.5–15.5)
WBC: 3.1 10*3/uL — ABNORMAL LOW (ref 4.0–10.5)

## 2014-10-04 LAB — DIFFERENTIAL
BASOS ABS: 0 10*3/uL (ref 0.0–0.1)
BASOS PCT: 0 % (ref 0–1)
EOS ABS: 0.2 10*3/uL (ref 0.0–0.7)
EOS PCT: 6 % — AB (ref 0–5)
LYMPHS ABS: 1.1 10*3/uL (ref 0.7–4.0)
Lymphocytes Relative: 42 % (ref 12–46)
MONO ABS: 0.2 10*3/uL (ref 0.1–1.0)
Monocytes Relative: 8 % (ref 3–12)
Neutro Abs: 1.1 10*3/uL — ABNORMAL LOW (ref 1.7–7.7)
Neutrophils Relative %: 44 % (ref 43–77)

## 2014-10-04 LAB — BASIC METABOLIC PANEL
ANION GAP: 7 (ref 5–15)
BUN: 18 mg/dL (ref 6–20)
CO2: 25 mmol/L (ref 22–32)
CREATININE: 0.85 mg/dL (ref 0.61–1.24)
Calcium: 9.4 mg/dL (ref 8.9–10.3)
Chloride: 108 mmol/L (ref 101–111)
GFR calc Af Amer: >60 mL/min (ref >60)
GFR calc non Af Amer: >60 mL/min (ref >60)
Glucose, Bld: 102 mg/dL — ABNORMAL HIGH (ref 65–99)
Potassium: 3.9 mmol/L (ref 3.5–5.1)
SODIUM: 140 mmol/L (ref 135–145)

## 2014-10-04 LAB — CBC
HEMATOCRIT: 41.9 % (ref 39.0–52.0)
Hemoglobin: 13.8 g/dL (ref 13.0–17.0)
MCH: 30.1 pg (ref 26.0–34.0)
MCHC: 32.9 g/dL (ref 30.0–36.0)
MCV: 91.5 fL (ref 78.0–100.0)
Platelets: 136 10*3/uL — ABNORMAL LOW (ref 150–400)
RBC: 4.58 MIL/uL (ref 4.22–5.81)
RDW: 12.7 % (ref 11.5–15.5)
WBC: 2.8 10*3/uL — ABNORMAL LOW (ref 4.0–10.5)

## 2014-10-04 LAB — PROTIME-INR
INR: 1.05 (ref 0.00–1.49)
Prothrombin Time: 13.9 seconds (ref 11.6–15.2)

## 2014-10-04 LAB — APTT: aPTT: 27 seconds (ref 24–37)

## 2014-10-04 MED ORDER — FENTANYL CITRATE (PF) 100 MCG/2ML IJ SOLN
INTRAMUSCULAR | Status: AC | PRN
  Administered 2014-10-04: 25 ug via INTRAVENOUS

## 2014-10-04 MED ORDER — MIDAZOLAM HCL 2 MG/2ML IJ SOLN
INTRAMUSCULAR | Status: AC | PRN
  Administered 2014-10-04: 1 mg via INTRAVENOUS

## 2014-10-04 MED ORDER — CEFAZOLIN SODIUM-DEXTROSE 2-3 GM-% IV SOLR
INTRAVENOUS | Status: AC
  Filled 2014-10-04: qty 50

## 2014-10-04 MED ORDER — CEFAZOLIN SODIUM-DEXTROSE 2-3 GM-% IV SOLR
2.0000 g | Freq: Once | INTRAVENOUS | Status: AC
  Administered 2014-10-04: 2 g via INTRAVENOUS

## 2014-10-04 MED ORDER — SODIUM CHLORIDE 0.9 % IV SOLN
Freq: Once | INTRAVENOUS | Status: AC
  Administered 2014-10-04: 08:00:00 via INTRAVENOUS

## 2014-10-04 MED ORDER — FENTANYL CITRATE (PF) 100 MCG/2ML IJ SOLN
INTRAMUSCULAR | Status: AC
  Filled 2014-10-04: qty 2

## 2014-10-04 MED ORDER — MIDAZOLAM HCL 2 MG/2ML IJ SOLN
INTRAMUSCULAR | Status: AC
  Filled 2014-10-04: qty 4

## 2014-10-04 MED ORDER — LIDOCAINE HCL 1 % IJ SOLN
INTRAMUSCULAR | Status: AC
  Filled 2014-10-04: qty 20

## 2014-10-04 NOTE — Discharge Instructions (Signed)
Incision Care °An incision is when a surgeon cuts into your body tissues. After surgery, the incision needs to be cared for properly to prevent infection.  °HOME CARE INSTRUCTIONS  °· Take all medicine as directed by your caregiver. Only take over-the-counter or prescription medicines for pain, discomfort, or fever as directed by your caregiver. °· Do not remove your bandage (dressing) or get your incision wet until your surgeon gives you permission. In the event that your dressing becomes wet, dirty, or starts to smell, change the dressing and call your surgeon for instructions as soon as possible. °· Take showers. Do not take tub baths, swim, or do anything that may soak the wound until it is healed. °· Resume your normal diet and activities as directed or allowed. °· Avoid lifting any weight until you are instructed otherwise. °· Use anti-itch antihistamine medicine as directed by your caregiver. The wound may itch when it is healing. Do not pick or scratch at the wound. °· Follow up with your caregiver for stitch (suture) or staple removal as directed. °· Drink enough fluids to keep your urine clear or pale yellow. °SEEK MEDICAL CARE IF:  °· You have redness, swelling, or increasing pain in the wound that is not controlled with medicine. °· You have drainage, blood, or pus coming from the wound that lasts longer than 1 day. °· You develop muscle aches, chills, or a general ill feeling. °· You notice a bad smell coming from the wound or dressing. °· Your wound edges separate after the sutures, staples, or skin adhesive strips have been removed. °· You develop persistent nausea or vomiting. °SEEK IMMEDIATE MEDICAL CARE IF:  °· You have a fever. °· You develop a rash. °· You develop dizzy episodes or faint while standing. °· You have difficulty breathing. °· You develop any reaction or side effects to medicine given. °MAKE SURE YOU:  °· Understand these instructions. °· Will watch your condition. °· Will get help  right away if you are not doing well or get worse. °Document Released: 09/29/2004 Document Revised: 06/04/2011 Document Reviewed: 05/06/2013 °Exit[employer]® Patient Information ©2015 [employer]. This information is not intended to replace advice given to you by your health care provider. Make sure you discuss any questions you have with your health care provider. °Conscious Sedation, Adult, Care After °Refer to this sheet in the next few weeks. These instructions provide you with information on caring for yourself after your procedure. Your health care provider may also give you more specific instructions. Your treatment has been planned according to current medical practices, but problems sometimes occur. Call your health care provider if you have any problems or questions after your procedure. °WHAT TO EXPECT AFTER THE PROCEDURE  °After your procedure: °· You may feel sleepy, clumsy, and have poor balance for several hours. °· Vomiting may occur if you eat too soon after the procedure. °HOME CARE INSTRUCTIONS °· Do not participate in any activities where you could become injured for at least 24 hours. Do not: °¨ Drive. °¨ Swim. °¨ Ride a bicycle. °¨ Operate heavy machinery. °¨ Cook. °¨ Use power tools. °¨ Climb ladders. °¨ Work from a high place. °· Do not make important decisions or sign legal documents until you are improved. °· If you vomit, drink water, juice, or soup when you can drink without vomiting. Make sure you have little or no nausea before eating solid foods. °· Only take over-the-counter or prescription medicines for pain, discomfort, or fever as directed by   your health care provider. °· Make sure you and your family fully understand everything about the medicines given to you, including what side effects may occur. °· You should not drink alcohol, take sleeping pills, or take medicines that cause drowsiness for at least 24 hours. °· If you smoke, do not smoke without supervision. °· If you are  feeling better, you may resume normal activities 24 hours after you were sedated. °· Keep all appointments with your health care provider. °SEEK MEDICAL CARE IF: °· Your skin is pale or bluish in color. °· You continue to feel nauseous or vomit. °· Your pain is getting worse and is not helped by medicine. °· You have bleeding or swelling. °· You are still sleepy or feeling clumsy after 24 hours. °SEEK IMMEDIATE MEDICAL CARE IF: °· You develop a rash. °· You have difficulty breathing. °· You develop any type of allergic problem. °· You have a fever. °MAKE SURE YOU: °· Understand these instructions. °· Will watch your condition. °· Will get help right away if you are not doing well or get worse. °Document Released: 12/31/2012 Document Reviewed: 12/31/2012 °Exit[employer]® Patient Information ©2015 [employer]. This information is not intended to replace advice given to you by your health care provider. Make sure you discuss any questions you have with your health care provider. ° °

## 2014-10-04 NOTE — Procedures (Signed)
R IJ Port removal No complication No blood loss. See complete dictation in [facility].

## 2014-10-04 NOTE — H&P (Signed)
Referring Physician(s): Shadad,Firas N  Subjective:  Pt with history of stage IV prostate cancer, s/p chemotherapy and currently on androgen deprivation/observation- surveillance. He had right IJ port a cath placed by IR 04/2014. He presents today for port a cath removal. He is currently asymptomatic.  Allergies: Review of patient's allergies indicates no known allergies.  Medications: Prior to Admission medications   Medication Sig Start Date End Date Taking? Authorizing Provider  meclizine (ANTIVERT) 25 MG tablet Take 25 mg by mouth 3 (three) times daily as needed for dizziness.  12/07/13  Yes Historical Provider, MD  Tetrahydrozoline HCl (VISINE OP) Apply 1-2 drops to eye daily as needed (dry/red eyes).   Yes Historical Provider, MD  aspirin 81 MG tablet Take 81 mg by mouth every morning.     Historical Provider, MD  diphenhydrAMINE (BENADRYL) 25 mg capsule Take 25 mg by mouth every 6 (six) hours as needed for allergies.     Historical Provider, MD  diphenhydramine-acetaminophen (TYLENOL PM) 25-500 MG TABS Take 1 tablet by mouth at bedtime as needed (for pain/sleep).     Historical Provider, MD  lidocaine-prilocaine (EMLA) cream Apply 1 application topically as needed. Apply to port on the day of chemotherapy. 04/13/14   Wyatt Portela, MD  loratadine (CLARITIN) 10 MG tablet Take 10 mg by mouth daily as needed for allergies.     Historical Provider, MD  ondansetron (ZOFRAN) 8 MG tablet TAKE 1 TABLET (8 MG TOTAL) BY MOUTH EVERY 8 (EIGHT) HOURS AS NEEDED FOR NAUSEA OR VOMITING. 07/16/14   Wyatt Portela, MD  sodium chloride (OCEAN) 0.65 % SOLN nasal spray Place 1 spray into both nostrils as needed for congestion.    Historical Provider, MD     Vital Signs: BP 133/72 mmHg  Pulse 69  Temp(Src) 97.7 F (36.5 C) (Oral)  Resp 20  SpO2 100%  Physical Exam  Constitutional: He is oriented to person, place, and time. He appears well-developed and well-nourished.  Cardiovascular: Normal  rate and regular rhythm.   Clean , intact rt chest wall PAC  Pulmonary/Chest: Effort normal and breath sounds normal.  Abdominal: Soft. Bowel sounds are normal. There is no tenderness.  Musculoskeletal: Normal range of motion. He exhibits no edema.  Neurological: He is alert and oriented to person, place, and time.    Imaging: No results found.  Labs:  CBC:  Recent Labs  07/30/14 0845 08/20/14 0839 09/30/14 0959 10/04/14 0749  WBC 3.7* 3.7* 4.1 2.8*  HGB 13.4 13.0 14.0 13.8  HCT 39.5 38.8 41.8 41.9  PLT 228 216 154 136*    COAGS:  Recent Labs  04/30/14 0945 10/04/14 0749  INR 1.00 1.05  APTT 28 27    BMP:  Recent Labs  04/30/14 0945  07/09/14 1153 07/30/14 0845 08/20/14 0840 09/30/14 0959 10/04/14 0749  NA 142  < > 143 145 145 144 140  K 3.8  < > 4.7 4.3 4.3 4.6 3.9  CL 110  --  108  --   --   --  108  CO2 25  < > 27 21* 24 24 25   GLUCOSE 98  < > 96 101 90 90 102*  BUN 20  < > 19 17.1 18.3 17.0 18  CALCIUM 9.6  < > 9.7 9.5 9.3 10.1 9.4  CREATININE 0.92  < > 0.77 0.7 0.8 0.8 0.85  GFRNONAA 86*  --  >90  --   --   --  >60  GFRAA >90  --  >  90  --   --   --  >60  < > = values in this interval not displayed.  LIVER FUNCTION TESTS:  Recent Labs  07/09/14 1153 07/30/14 0845 08/20/14 0840 09/30/14 0959  BILITOT 0.2* 0.53 0.35 0.37  AST 14 10 12 14   ALT 14 14 11 14   ALKPHOS 80 82 85 72  PROT 7.4 6.6 6.5 6.8  ALBUMIN 4.6 4.0 4.0 4.2    Assessment and Plan: Pt with history of stage IV prostate cancer, s/p chemotherapy and currently on androgen deprivation/observation- surveillance. He had right IJ port a cath placed by IR 04/2014. He presents today for port a cath removal. He is currently asymptomatic. Details/risks of procedure, including but not limited to, internal bleeding, infection, d/w pt/wife with their understanding and consent.    Signed: D. Rowe Robert 10/04/2014, 8:51 AM   I spent a total of 15 minutes in face to face in clinical  consultation/evaluation, greater than 50% of which was counseling/coordinating care for port a cath removal

## 2014-10-18 DIAGNOSIS — C775 Secondary and unspecified malignant neoplasm of intrapelvic lymph nodes: Secondary | ICD-10-CM | POA: Diagnosis not present

## 2014-10-18 DIAGNOSIS — C61 Malignant neoplasm of prostate: Secondary | ICD-10-CM | POA: Diagnosis not present

## 2014-10-25 DIAGNOSIS — C61 Malignant neoplasm of prostate: Secondary | ICD-10-CM | POA: Diagnosis not present

## 2014-10-25 DIAGNOSIS — C775 Secondary and unspecified malignant neoplasm of intrapelvic lymph nodes: Secondary | ICD-10-CM | POA: Diagnosis not present

## 2014-10-25 DIAGNOSIS — R399 Unspecified symptoms and signs involving the genitourinary system: Secondary | ICD-10-CM | POA: Diagnosis not present

## 2014-12-24 ENCOUNTER — Encounter: Payer: Self-pay | Admitting: Internal Medicine

## 2014-12-24 ENCOUNTER — Ambulatory Visit (INDEPENDENT_AMBULATORY_CARE_PROVIDER_SITE_OTHER): Payer: Medicare Other | Admitting: Internal Medicine

## 2014-12-24 VITALS — BP 122/78 | HR 75 | Temp 98.0°F | Ht 73.0 in | Wt 183.0 lb

## 2014-12-24 DIAGNOSIS — C61 Malignant neoplasm of prostate: Secondary | ICD-10-CM | POA: Diagnosis not present

## 2014-12-24 DIAGNOSIS — Z7189 Other specified counseling: Secondary | ICD-10-CM | POA: Diagnosis not present

## 2014-12-24 DIAGNOSIS — Z23 Encounter for immunization: Secondary | ICD-10-CM

## 2014-12-24 DIAGNOSIS — Z0000 Encounter for general adult medical examination without abnormal findings: Secondary | ICD-10-CM | POA: Diagnosis not present

## 2014-12-24 NOTE — Progress Notes (Signed)
Subjective:    Patient ID: [name], male    DOB: 11/07/1945, 69 y.o.   MRN: [id number]  HPI Here for Medicare wellness visit and follow up of chronic medical conditions Reviewed form and advanced directives Reviewed other doctors No tobacco or alcohol Exercises regularly Hearing and vision are fine Did have a fall about 2-3 weeks ago-- while on ladder putting gutter covers on. Ladder tipped and slid. Hit buttocks and low back--bruise in right ribs. 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 aspirin 81 MG tablet Take 81 mg by mouth every morning.     . diphenhydrAMINE (BENADRYL) 25 mg capsule Take 25 mg by mouth every 6 (six) hours as needed for allergies.     . diphenhydramine-acetaminophen (TYLENOL PM) 25-500 MG TABS Take 1 tablet by mouth at bedtime as needed (for pain/sleep).     Marland Kitchen lidocaine-prilocaine (EMLA) cream Apply 1 application topically as needed. Apply to port on the day of chemotherapy. 30 g 0  . loratadine (CLARITIN) 10 MG tablet Take 10 mg by mouth daily as needed for allergies.     Marland Kitchen meclizine (ANTIVERT) 25 MG tablet Take 25 mg by mouth 3 (three) times daily as needed for dizziness.   1  . ondansetron (ZOFRAN) 8 MG tablet TAKE 1 TABLET (8 MG TOTAL) BY MOUTH EVERY 8 (EIGHT) HOURS AS NEEDED FOR NAUSEA OR VOMITING. 20 tablet 0  . sodium chloride (OCEAN) 0.65 % SOLN nasal spray Place 1 spray into both nostrils as needed for congestion.    . Tetrahydrozoline HCl (VISINE OP) Apply 1-2 drops to eye daily as needed (dry/red eyes).     No current facility-administered medications on file prior  to visit.    No Known Allergies  Past Medical History  Diagnosis Date  . Hx of colonic polyps   . Diverticulosis of colon   . Prostate cancer     Past Surgical History  Procedure Laterality Date  . Cardiovascular stress test  1996    Negative    Family History  Problem Relation Age of Onset  . Heart attack Father   . Heart disease Father   . Heart attack Brother 15  . Heart disease Brother   . Heart attack Brother   . Heart disease Brother   . Cancer Sister     breast  . Hypertension Mother   . Diabetes Sister   . Cancer Maternal Aunt   . Cancer Maternal Aunt   . Asthma Son     Social History   Social History  . Marital Status: Married    Spouse Name: N/A  . Number of Children: 3  . Years of Education: N/A   Occupational History  . cancer registry reporting    Social History Main Topics  . Smoking status: Former Smoker -- 1.00 packs/day for 10 years    Types: Cigarettes    Quit date: 03/26/1990  . Smokeless tobacco: Never Used  . Alcohol Use: Not on file  . Drug Use: Not on file  . Sexual Activity: Not on file   Other Topics Concern  . Not on file   Social  History Narrative   Has living will   Wife is health care POA.   Would accept resuscitation    No tube feeds if cognitively unaware.   Review of Systems Uses tylenol PM ---helps him rest better. No AM side effects that he can tell. Appetite is good Weight is stable Wears seat belt Teeth are fine--regular with dentist No chest pain No SOB No syncope. Will still notice dizziness if he tips his head back fully--very brief No headaches Bowels are fine Voids fine. Nocturia x 1 usually. No daytime problems.    Objective:   Physical Exam  Constitutional: He is oriented to person, place, and time. He appears well-developed and well-nourished. No distress.  HENT:  Mouth/Throat: Oropharynx is clear and moist. No oropharyngeal exudate.  Neck: Normal range of motion. Neck supple. No thyromegaly  present.  Cardiovascular: Normal rate, regular rhythm, normal heart sounds and intact distal pulses.  Exam reveals no gallop.   No murmur heard. Pulmonary/Chest: Effort normal and breath sounds normal. No respiratory distress. He has no wheezes. He has no rales.  Abdominal: Soft. There is no tenderness.  Musculoskeletal: He exhibits no edema or tenderness.  Lymphadenopathy:    He has no cervical adenopathy.  Neurological: He is alert and oriented to person, place, and time.  President-- "Obama, Bush, Clinton" [phone] [id number] Recall 3/3  Skin: No rash noted. No erythema.  Psychiatric: He has a normal mood and affect. His behavior is normal.          Assessment & Plan:

## 2014-12-24 NOTE — Addendum Note (Signed)
Addended by: Despina Hidden on: 12/24/2014 11:49 AM   Modules accepted: Orders

## 2014-12-24 NOTE — Assessment & Plan Note (Signed)
Has had tremendous response to chemo Now on intermittent lupron

## 2014-12-24 NOTE — Progress Notes (Signed)
Pre visit review using our clinic review tool, if applicable. No additional management support is needed unless otherwise documented below in the visit note. 

## 2014-12-24 NOTE — Assessment & Plan Note (Signed)
I have personally reviewed the Medicare Annual Wellness questionnaire and have noted 1. The patient's medical and social history 2. Their use of alcohol, tobacco or illicit drugs 3. Their current medications and supplements 4. The patient's functional ability including ADL's, fall risks, home safety risks and hearing or visual             impairment. 5. Diet and physical activities 6. Evidence for depression or mood disorders  The patients weight, height, BMI and visual acuity have been recorded in the chart I have made referrals, counseling and provided education to the patient based review of the above and I have provided the pt with a written personalized care plan for preventive services.  I have provided you with a copy of your personalized plan for preventive services. Please take the time to review along with your updated medication list.  Flu vaccine today Due for colonoscopy next year Otherwise UTD

## 2014-12-24 NOTE — Assessment & Plan Note (Signed)
Now with formal advanced directives

## 2014-12-31 ENCOUNTER — Other Ambulatory Visit (HOSPITAL_BASED_OUTPATIENT_CLINIC_OR_DEPARTMENT_OTHER): Payer: Medicare Other

## 2014-12-31 ENCOUNTER — Telehealth: Payer: Self-pay | Admitting: Oncology

## 2014-12-31 ENCOUNTER — Ambulatory Visit (HOSPITAL_BASED_OUTPATIENT_CLINIC_OR_DEPARTMENT_OTHER): Payer: Medicare Other | Admitting: Oncology

## 2014-12-31 VITALS — BP 128/81 | HR 68 | Temp 98.2°F | Ht 73.0 in | Wt 183.5 lb

## 2014-12-31 DIAGNOSIS — C61 Malignant neoplasm of prostate: Secondary | ICD-10-CM | POA: Diagnosis not present

## 2014-12-31 DIAGNOSIS — R599 Enlarged lymph nodes, unspecified: Secondary | ICD-10-CM | POA: Diagnosis not present

## 2014-12-31 LAB — COMPREHENSIVE METABOLIC PANEL (CC13)
ALBUMIN: 4.1 g/dL (ref 3.5–5.0)
ALK PHOS: 92 U/L (ref 40–150)
ALT: 13 U/L (ref 0–55)
AST: 12 U/L (ref 5–34)
Anion Gap: 6 mEq/L (ref 3–11)
BUN: 15.8 mg/dL (ref 7.0–26.0)
CALCIUM: 9.8 mg/dL (ref 8.4–10.4)
CO2: 27 mEq/L (ref 22–29)
Chloride: 111 mEq/L — ABNORMAL HIGH (ref 98–109)
Creatinine: 0.8 mg/dL (ref 0.7–1.3)
Glucose: 108 mg/dl (ref 70–140)
POTASSIUM: 4.4 meq/L (ref 3.5–5.1)
SODIUM: 144 meq/L (ref 136–145)
Total Bilirubin: 0.46 mg/dL (ref 0.20–1.20)
Total Protein: 6.7 g/dL (ref 6.4–8.3)

## 2014-12-31 LAB — CBC WITH DIFFERENTIAL/PLATELET
BASO%: 0.5 % (ref 0.0–2.0)
BASOS ABS: 0 10*3/uL (ref 0.0–0.1)
EOS%: 2.7 % (ref 0.0–7.0)
Eosinophils Absolute: 0.1 10*3/uL (ref 0.0–0.5)
HEMATOCRIT: 41.7 % (ref 38.4–49.9)
HGB: 14.1 g/dL (ref 13.0–17.1)
LYMPH#: 1.5 10*3/uL (ref 0.9–3.3)
LYMPH%: 32.8 % (ref 14.0–49.0)
MCH: 29.3 pg (ref 27.2–33.4)
MCHC: 33.9 g/dL (ref 32.0–36.0)
MCV: 86.6 fL (ref 79.3–98.0)
MONO#: 0.4 10*3/uL (ref 0.1–0.9)
MONO%: 8.3 % (ref 0.0–14.0)
NEUT%: 55.7 % (ref 39.0–75.0)
NEUTROS ABS: 2.5 10*3/uL (ref 1.5–6.5)
PLATELETS: 192 10*3/uL (ref 140–400)
RBC: 4.82 10*6/uL (ref 4.20–5.82)
RDW: 13.8 % (ref 11.0–14.6)
WBC: 4.5 10*3/uL (ref 4.0–10.3)

## 2014-12-31 NOTE — Telephone Encounter (Signed)
Gave patient avs report and appointments for January 2017. °

## 2014-12-31 NOTE — Progress Notes (Signed)
Hematology and Oncology Follow Up Visit  [name] [id number] 1946/01/16 69 y.o. 12/31/2014 10:13 AM   Principle Diagnosis: 69 year old gentleman with prostate cancer diagnosed in October 2015. He had a Gleason score 4+5 = 9 and a PSA of 116. Abdominal imaging showed bulky retroperitoneal lymphadenopathy. He does not have any bony involvement.   Prior Therapy:  Status post prostate biopsy on 01/19/2014. Systemic chemotherapy in the form of Taxotere 75 mg/m every 3 weeks with plans for total of 6 cycles. Cycle 1 given on 05/05/2014. He is status post 6 cycles of therapy concluded on 08/20/2014.  Current therapy:   Androgen deprivation under the care of Dr. [name].   Interim History:  Mr. Haughn presents today for a follow-up visit with his wife. Since the last visit, he continues to recover from chemotherapy without much delayed complication. He reports slight change in his taste which is improving over time. Does not report any residual neuropathy and have regained all activities of daily living.   He continues to be very active and exercises regularly. He continues to work full time and have not reported any setbacks.  He does not report any headaches, blurry vision or syncope. He does not report any seizures or any neurological symptoms. He does not report any fevers, chills, weight loss, appetite changes or decline his energy. He does not report any chest pain, palpitation, orthopnea or leg edema. He does not report any cough, hemoptysis or hematemesis. He does not report any nausea, vomiting, abdominal pain, constipation or diarrhea. He does not report any frequency, urgency, hesitancy or nocturia. He does not report any incontinence or hematuria. He does not report any skeletal complaints of arthralgias myalgias. He does not report any early satiety or abdominal distention. Remainder of his review of systems unremarkable.   Medications: I have reviewed the patient's current  medications.  Current Outpatient Prescriptions  Medication Sig Dispense Refill  . aspirin 81 MG tablet Take 81 mg by mouth every morning.     . diphenhydrAMINE (BENADRYL) 25 mg capsule Take 25 mg by mouth every 6 (six) hours as needed for allergies.     . diphenhydramine-acetaminophen (TYLENOL PM) 25-500 MG TABS Take 1 tablet by mouth at bedtime as needed (for pain/sleep).     Marland Kitchen lidocaine-prilocaine (EMLA) cream Apply 1 application topically as needed. Apply to port on the day of chemotherapy. 30 g 0  . loratadine (CLARITIN) 10 MG tablet Take 10 mg by mouth daily as needed for allergies.     Marland Kitchen meclizine (ANTIVERT) 25 MG tablet Take 25 mg by mouth 3 (three) times daily as needed for dizziness.   1  . ondansetron (ZOFRAN) 8 MG tablet TAKE 1 TABLET (8 MG TOTAL) BY MOUTH EVERY 8 (EIGHT) HOURS AS NEEDED FOR NAUSEA OR VOMITING. 20 tablet 0  . sodium chloride (OCEAN) 0.65 % SOLN nasal spray Place 1 spray into both nostrils as needed for congestion.    . Tetrahydrozoline HCl (VISINE OP) Apply 1-2 drops to eye daily as needed (dry/red eyes).     No current facility-administered medications for this visit.     Allergies: No Known Allergies  Past Medical History, Surgical history, Social history, and Family History were reviewed and updated.   Physical Exam: Blood pressure 128/81, pulse 68, temperature 98.2 F (36.8 C), temperature source Oral, height 6\' 1"  (1.854 m), weight 183 lb 8 oz (83.235 kg), SpO2 99 %. ECOG: 0 General appearance: alert and cooperative well-appearing gentleman. Head: Normocephalic, without obvious abnormality  Neck: no adenopathy Lymph nodes: Cervical, supraclavicular, and axillary nodes normal. Heart:regular rate and rhythm, S1, S2 normal, no murmur, click, rub or gallop Lung:chest clear, no wheezing, rales, normal symmetric air entry Abdomin: soft, non-tender, without masses or organomegaly EXT:no erythema, induration, or nodules Right anterior chest Port-A-Cath,  non-tender well-healed incision.   Lab Results: Lab Results  Component Value Date   WBC 4.5 12/31/2014   HGB 14.1 12/31/2014   HCT 41.7 12/31/2014   MCV 86.6 12/31/2014   PLT 192 12/31/2014     Chemistry      Component Value Date/Time   NA 144 12/31/2014 0929   NA 140 10/04/2014 0749   K 4.4 12/31/2014 0929   K 3.9 10/04/2014 0749   CL 108 10/04/2014 0749   CO2 27 12/31/2014 0929   CO2 25 10/04/2014 0749   BUN 15.8 12/31/2014 0929   BUN 18 10/04/2014 0749   CREATININE 0.8 12/31/2014 0929   CREATININE 0.85 10/04/2014 0749      Component Value Date/Time   CALCIUM 9.8 12/31/2014 0929   CALCIUM 9.4 10/04/2014 0749   ALKPHOS 92 12/31/2014 0929   ALKPHOS 80 07/09/2014 1153   AST 12 12/31/2014 0929   AST 14 07/09/2014 1153   ALT 13 12/31/2014 0929   ALT 14 07/09/2014 1153   BILITOT 0.46 12/31/2014 0929   BILITOT 0.2* 07/09/2014 1153      Results for Hunnell, BURT PIATEK (MRN [id number]) as of 12/31/2014 10:03  Ref. Range 07/09/2014 10:22 07/30/2014 08:45 08/20/2014 08:40 09/30/2014 09:59  PSA Latest Ref Range: <=4.00 ng/mL 1.20 0.65 0.51 0.39       Impression and Plan:  69 year old gentleman with the following issues:  1. Advanced prostate cancer presenting with stage IV disease. His Gleason score is 4+5 = 9 with heavy volume disease involving 11 out of 12 cores of his biopsy obtained in October 2015. His PSA is 116 and his staging workup revealed lymphadenopathy in the periaortic and iliac chains. He started androgen depravation under the care of Dr. [name].  He is status post 6 cycles of chemotherapy in addition to an vision depravation. His PSA have responded nicely down to 0.51. His most recent PSA is down to 0.39 and does not have any symptoms of cancer progression.  If his PSA starts to rise again, we will likely use second line hormone therapy at that time. No indication for any treatment at this time.  2. IV access: Port-A-Cath will be removed based on the patient's  request. No complications at this time.  3. Androgen depravation: This will continued under the care of Dr. [name].  4. Follow-up: Will be in 3 months sooner if needed to.   Edvin Albus,MD 10/7/201610:13 AM

## 2015-01-03 ENCOUNTER — Telehealth: Payer: Self-pay | Admitting: *Deleted

## 2015-01-03 LAB — PSA: PSA: 0.39 ng/mL (ref <=4.00)

## 2015-01-03 NOTE — Telephone Encounter (Signed)
Per Dr. Shadad, I informed patient of his PSA level. Patient verbalized understanding. 

## 2015-01-03 NOTE — Telephone Encounter (Signed)
-----   Message from Wyatt Portela, MD sent at 01/03/2015  8:53 AM EDT ----- Please call his PSA

## 2015-01-13 DIAGNOSIS — H2513 Age-related nuclear cataract, bilateral: Secondary | ICD-10-CM | POA: Diagnosis not present

## 2015-01-24 DIAGNOSIS — C61 Malignant neoplasm of prostate: Secondary | ICD-10-CM | POA: Diagnosis not present

## 2015-01-31 DIAGNOSIS — C61 Malignant neoplasm of prostate: Secondary | ICD-10-CM | POA: Diagnosis not present

## 2015-04-01 ENCOUNTER — Telehealth: Payer: Self-pay | Admitting: Oncology

## 2015-04-01 ENCOUNTER — Ambulatory Visit (HOSPITAL_BASED_OUTPATIENT_CLINIC_OR_DEPARTMENT_OTHER): Payer: Medicare Other | Admitting: Oncology

## 2015-04-01 ENCOUNTER — Other Ambulatory Visit (HOSPITAL_BASED_OUTPATIENT_CLINIC_OR_DEPARTMENT_OTHER): Payer: Medicare Other

## 2015-04-01 VITALS — BP 128/74 | HR 18 | Temp 97.6°F | Resp 98 | Ht 73.0 in | Wt 185.0 lb

## 2015-04-01 DIAGNOSIS — C61 Malignant neoplasm of prostate: Secondary | ICD-10-CM

## 2015-04-01 DIAGNOSIS — R599 Enlarged lymph nodes, unspecified: Secondary | ICD-10-CM | POA: Diagnosis not present

## 2015-04-01 LAB — CBC WITH DIFFERENTIAL/PLATELET
BASO%: 0.2 % (ref 0.0–2.0)
BASOS ABS: 0 10*3/uL (ref 0.0–0.1)
EOS ABS: 0.1 10*3/uL (ref 0.0–0.5)
EOS%: 1.4 % (ref 0.0–7.0)
HCT: 42 % (ref 38.4–49.9)
HGB: 14.4 g/dL (ref 13.0–17.1)
LYMPH%: 35.5 % (ref 14.0–49.0)
MCH: 29.8 pg (ref 27.2–33.4)
MCHC: 34.3 g/dL (ref 32.0–36.0)
MCV: 87 fL (ref 79.3–98.0)
MONO#: 0.3 10*3/uL (ref 0.1–0.9)
MONO%: 8 % (ref 0.0–14.0)
NEUT%: 54.9 % (ref 39.0–75.0)
NEUTROS ABS: 2.3 10*3/uL (ref 1.5–6.5)
Platelets: 176 10*3/uL (ref 140–400)
RBC: 4.83 10*6/uL (ref 4.20–5.82)
RDW: 13.1 % (ref 11.0–14.6)
WBC: 4.1 10*3/uL (ref 4.0–10.3)
lymph#: 1.5 10*3/uL (ref 0.9–3.3)

## 2015-04-01 LAB — COMPREHENSIVE METABOLIC PANEL
ALK PHOS: 77 U/L (ref 40–150)
ALT: 13 U/L (ref 0–55)
AST: 14 U/L (ref 5–34)
Albumin: 4.3 g/dL (ref 3.5–5.0)
Anion Gap: 9 mEq/L (ref 3–11)
BILIRUBIN TOTAL: 0.42 mg/dL (ref 0.20–1.20)
BUN: 17.5 mg/dL (ref 7.0–26.0)
CALCIUM: 9.9 mg/dL (ref 8.4–10.4)
CO2: 25 mEq/L (ref 22–29)
Chloride: 108 mEq/L (ref 98–109)
Creatinine: 0.8 mg/dL (ref 0.7–1.3)
EGFR: >90 mL/min/{1.73_m2} (ref >90)
Glucose: 91 mg/dl (ref 70–140)
Potassium: 4.4 mEq/L (ref 3.5–5.1)
SODIUM: 142 meq/L (ref 136–145)
TOTAL PROTEIN: 7.4 g/dL (ref 6.4–8.3)

## 2015-04-01 NOTE — Progress Notes (Signed)
Hematology and Oncology Follow Up Visit  [name] [id number] 1945/05/10 70 y.o. 04/01/2015 10:11 AM   Principle Diagnosis: 69 year old gentleman with prostate cancer diagnosed in October 2015. He had a Gleason score 4+5 = 9 and a PSA of 116. Abdominal imaging showed bulky retroperitoneal lymphadenopathy. He does not have any bony involvement.   Prior Therapy:  Status post prostate biopsy on 01/19/2014. Systemic chemotherapy in the form of Taxotere 75 mg/m every 3 weeks with plans for total of 6 cycles. Cycle 1 given on 05/05/2014. He is status post 6 cycles of therapy concluded on 08/20/2014.  Current therapy:   Androgen deprivation under the care of Dr. [name].   Interim History:  Mr. Veltre presents today for a follow-up visit with his wife. Since the last visit, he reports no new complaints. He reports no delayed complication related to chemotherapy. Does not report any residual neuropathy and have regained all activities of daily living. He continues to be active and working full time. He has not reported any skeletal complaints of arthralgias or myalgias. Has not reported any urinary symptoms.   He does not report any headaches, blurry vision or syncope. He does not report any seizures or any neurological symptoms. He does not report any fevers, chills, weight loss or decline his energy. He does not report any chest pain, palpitation, orthopnea or leg edema. He does not report any cough, hemoptysis or hematemesis. He does not report any nausea, vomiting, abdominal pain, constipation or diarrhea.  Remainder of his review of systems unremarkable.   Medications: I have reviewed the patient's current medications.  Current Outpatient Prescriptions  Medication Sig Dispense Refill  . aspirin 81 MG tablet Take 81 mg by mouth every morning.     . diphenhydrAMINE (BENADRYL) 25 mg capsule Take 25 mg by mouth every 6 (six) hours as needed for allergies.     . diphenhydramine-acetaminophen  (TYLENOL PM) 25-500 MG TABS Take 1 tablet by mouth at bedtime as needed (for pain/sleep).     Marland Kitchen lidocaine-prilocaine (EMLA) cream Apply 1 application topically as needed. Apply to port on the day of chemotherapy. 30 g 0  . loratadine (CLARITIN) 10 MG tablet Take 10 mg by mouth daily as needed for allergies.     Marland Kitchen ondansetron (ZOFRAN) 8 MG tablet TAKE 1 TABLET (8 MG TOTAL) BY MOUTH EVERY 8 (EIGHT) HOURS AS NEEDED FOR NAUSEA OR VOMITING. 20 tablet 0  . sodium chloride (OCEAN) 0.65 % SOLN nasal spray Place 1 spray into both nostrils as needed for congestion.    . Tetrahydrozoline HCl (VISINE OP) Apply 1-2 drops to eye daily as needed (dry/red eyes).    . meclizine (ANTIVERT) 25 MG tablet Take 25 mg by mouth 3 (three) times daily as needed for dizziness.   1   No current facility-administered medications for this visit.     Allergies: No Known Allergies  Past Medical History, Surgical history, Social history, and Family History were reviewed and updated.   Physical Exam: Blood pressure 128/74, pulse 18, temperature 97.6 F (36.4 C), temperature source Oral, resp. rate 98, height 6\' 1"  (1.854 m), weight 185 lb (83.915 kg), SpO2 100 %. ECOG: 0 General appearance: alert and cooperative not in any distress. Head: Normocephalic, without obvious abnormality Neck: no adenopathy no thyroid masses. Lymph nodes: Cervical, supraclavicular, and axillary nodes normal. Heart:regular rate and rhythm, S1, S2 normal, no murmur, click, rub or gallop Lung:chest clear, no wheezing, rales, normal symmetric air entry Abdomin: soft, non-tender, without masses  or organomegaly EXT:no erythema, induration, or nodules Chest wall examination revealed a Port-A-Cath site appeared to be well-healed.  Lab Results: Lab Results  Component Value Date   WBC 4.1 04/01/2015   HGB 14.4 04/01/2015   HCT 42.0 04/01/2015   MCV 87.0 04/01/2015   PLT 176 04/01/2015     Chemistry      Component Value Date/Time   NA 144  12/31/2014 0929   NA 140 10/04/2014 0749   K 4.4 12/31/2014 0929   K 3.9 10/04/2014 0749   CL 108 10/04/2014 0749   CO2 27 12/31/2014 0929   CO2 25 10/04/2014 0749   BUN 15.8 12/31/2014 0929   BUN 18 10/04/2014 0749   CREATININE 0.8 12/31/2014 0929   CREATININE 0.85 10/04/2014 0749      Component Value Date/Time   CALCIUM 9.8 12/31/2014 0929   CALCIUM 9.4 10/04/2014 0749   ALKPHOS 92 12/31/2014 0929   ALKPHOS 80 07/09/2014 1153   AST 12 12/31/2014 0929   AST 14 07/09/2014 1153   ALT 13 12/31/2014 0929   ALT 14 07/09/2014 1153   BILITOT 0.46 12/31/2014 0929   BILITOT 0.2* 07/09/2014 1153        Results for Kahl, KEONTAE EBSEN (MRN [id number]) as of 04/01/2015 09:44  Ref. Range 08/20/2014 08:40 09/30/2014 09:59 12/31/2014 09:28  PSA Latest Ref Range: <=4.00 ng/mL 0.51 0.39 0.39      Impression and Plan:  70 year old gentleman with the following issues:  1. Advanced prostate cancer presenting with stage IV disease. His Gleason score is 4+5 = 9 with heavy volume disease involving 11 out of 12 cores of his biopsy obtained in October 2015. His PSA is 116 and his staging workup revealed lymphadenopathy in the periaortic and iliac chains. He started androgen depravation under the care of Dr. [name].  He is status post 6 cycles of chemotherapy in addition to an vision depravation. His PSA continues to be under excellent control down to 0.39. The plan is to continue with androgen deprivation indefinitely and and second line hormonal therapy upon progression. He developed rapid rise in his PSA or change in his imaging studies, Zytiga on Xtandi would be the next choice.  2. IV access: Port-A-Cath will be removed based on the patient's request. No issues reported since that time.  3. Androgen depravation: This will continued under the care of Dr. [name]. I encouraged to continue that indefinitely.  4. Follow-up: Will be in 3 months sooner if needed to.   Hadi Dubin,MD 1/6/201710:11 AM

## 2015-04-01 NOTE — Telephone Encounter (Signed)
per pof to sch pt appt-gave tp copy of avs °

## 2015-04-02 LAB — PSA: PSA: 0.71 ng/mL (ref <=4.00)

## 2015-04-04 ENCOUNTER — Telehealth: Payer: Self-pay | Admitting: *Deleted

## 2015-04-04 NOTE — Telephone Encounter (Signed)
-----   Message from Wyatt Portela, MD sent at 04/04/2015  9:02 AM EST ----- Please call his PSA. Slight increase. No change for now.

## 2015-04-04 NOTE — Telephone Encounter (Signed)
As noted below by Dr. Shadad, I informed patient of his PSA level. Patient verbalized understanding. 

## 2015-05-02 DIAGNOSIS — C61 Malignant neoplasm of prostate: Secondary | ICD-10-CM | POA: Diagnosis not present

## 2015-05-09 DIAGNOSIS — C61 Malignant neoplasm of prostate: Secondary | ICD-10-CM | POA: Diagnosis not present

## 2015-05-09 DIAGNOSIS — C775 Secondary and unspecified malignant neoplasm of intrapelvic lymph nodes: Secondary | ICD-10-CM | POA: Diagnosis not present

## 2015-05-09 DIAGNOSIS — Z0000 Encounter for general adult medical examination without abnormal findings: Secondary | ICD-10-CM | POA: Diagnosis not present

## 2015-05-10 ENCOUNTER — Encounter: Payer: Self-pay | Admitting: *Deleted

## 2015-06-20 ENCOUNTER — Encounter: Payer: Self-pay | Admitting: Internal Medicine

## 2015-06-21 NOTE — Telephone Encounter (Signed)
Can you see what this is all about please?

## 2015-07-01 ENCOUNTER — Telehealth: Payer: Self-pay | Admitting: Oncology

## 2015-07-01 ENCOUNTER — Other Ambulatory Visit (HOSPITAL_BASED_OUTPATIENT_CLINIC_OR_DEPARTMENT_OTHER): Payer: Medicare Other

## 2015-07-01 ENCOUNTER — Ambulatory Visit (HOSPITAL_BASED_OUTPATIENT_CLINIC_OR_DEPARTMENT_OTHER): Payer: Medicare Other | Admitting: Oncology

## 2015-07-01 VITALS — BP 131/80 | HR 68 | Temp 97.5°F | Resp 18 | Wt 185.0 lb

## 2015-07-01 DIAGNOSIS — C61 Malignant neoplasm of prostate: Secondary | ICD-10-CM

## 2015-07-01 DIAGNOSIS — E290 Testicular hyperfunction: Secondary | ICD-10-CM | POA: Diagnosis not present

## 2015-07-01 LAB — CBC WITH DIFFERENTIAL/PLATELET
BASO%: 0.4 % (ref 0.0–2.0)
Basophils Absolute: 0 10*3/uL (ref 0.0–0.1)
EOS%: 2.1 % (ref 0.0–7.0)
Eosinophils Absolute: 0.1 10*3/uL (ref 0.0–0.5)
HEMATOCRIT: 42.6 % (ref 38.4–49.9)
HGB: 14.3 g/dL (ref 13.0–17.1)
LYMPH%: 27.8 % (ref 14.0–49.0)
MCH: 29.5 pg (ref 27.2–33.4)
MCHC: 33.6 g/dL (ref 32.0–36.0)
MCV: 88 fL (ref 79.3–98.0)
MONO#: 0.5 10*3/uL (ref 0.1–0.9)
MONO%: 10.1 % (ref 0.0–14.0)
NEUT#: 2.8 10*3/uL (ref 1.5–6.5)
NEUT%: 59.6 % (ref 39.0–75.0)
Platelets: 203 10*3/uL (ref 140–400)
RBC: 4.84 10*6/uL (ref 4.20–5.82)
RDW: 14.2 % (ref 11.0–14.6)
WBC: 4.8 10*3/uL (ref 4.0–10.3)
lymph#: 1.3 10*3/uL (ref 0.9–3.3)

## 2015-07-01 LAB — COMPREHENSIVE METABOLIC PANEL
ALT: 13 U/L (ref 0–55)
AST: 14 U/L (ref 5–34)
Albumin: 4.3 g/dL (ref 3.5–5.0)
Alkaline Phosphatase: 90 U/L (ref 40–150)
Anion Gap: 9 mEq/L (ref 3–11)
BUN: 14.8 mg/dL (ref 7.0–26.0)
CO2: 25 meq/L (ref 22–29)
CREATININE: 0.9 mg/dL (ref 0.7–1.3)
Calcium: 10.1 mg/dL (ref 8.4–10.4)
Chloride: 110 mEq/L — ABNORMAL HIGH (ref 98–109)
EGFR: 83 mL/min/{1.73_m2} — ABNORMAL LOW (ref >90)
GLUCOSE: 98 mg/dL (ref 70–140)
POTASSIUM: 4.3 meq/L (ref 3.5–5.1)
SODIUM: 144 meq/L (ref 136–145)
Total Bilirubin: 0.53 mg/dL (ref 0.20–1.20)
Total Protein: 7.5 g/dL (ref 6.4–8.3)

## 2015-07-01 NOTE — Telephone Encounter (Signed)
per pof to sch pt appt-gave pt copy of avs °

## 2015-07-01 NOTE — Progress Notes (Signed)
Hematology and Oncology Follow Up Visit  NUTE HEALAN [id number] 07-Dec-1945 70 y.o. 07/01/2015 9:43 AM   Principle Diagnosis: 71 year old gentleman with prostate cancer diagnosed in October 2015. He had a Gleason score 4+5 = 9 and a PSA of 116. Abdominal imaging showed bulky retroperitoneal lymphadenopathy. He does not have any bony involvement.   Prior Therapy:  Status post prostate biopsy on 01/19/2014. Systemic chemotherapy in the form of Taxotere 75 mg/m every 3 weeks with plans for total of 6 cycles. Cycle 1 given on 05/05/2014. He is status post 6 cycles of therapy concluded on 08/20/2014.  Current therapy:   Androgen deprivation under the care of Dr. [name].   Interim History:  Mr. Cornelison presents today for a follow-up visit with his wife. Since the last visit, he continues to do very well without any recent issues. He does not report any residual neuropathy related to chemotherapy. He continues to be active and working full time. He exercises regularly including multiple times per week. His performance status and exercise tolerance is back to normal. He has not reported any skeletal complaints of arthralgias or myalgias. Has not reported any urinary symptoms.   He does not report any headaches, blurry vision or syncope. He does not report any seizures or any neurological symptoms. He does not report any fevers, chills, weight loss or decline his energy. He does not report any chest pain, palpitation, orthopnea or leg edema. He does not report any cough, hemoptysis or hematemesis. He does not report any nausea, vomiting, abdominal pain, constipation or diarrhea.  Remainder of his review of systems unremarkable.   Medications: I have reviewed the patient's current medications.  Current Outpatient Prescriptions  Medication Sig Dispense Refill  . aspirin 81 MG tablet Take 81 mg by mouth every morning.     . diphenhydrAMINE (BENADRYL) 25 mg capsule Take 25 mg by mouth every 6 (six) hours  as needed for allergies.     . diphenhydramine-acetaminophen (TYLENOL PM) 25-500 MG TABS Take 1 tablet by mouth at bedtime as needed (for pain/sleep).     . fexofenadine (ALLEGRA) 180 MG tablet Take 180 mg by mouth daily.    . Tetrahydrozoline HCl (VISINE OP) Apply 1-2 drops to eye daily as needed (dry/red eyes).    Marland Kitchen lidocaine-prilocaine (EMLA) cream Apply 1 application topically as needed. Apply to port on the day of chemotherapy. (Patient not taking: Reported on 07/01/2015) 30 g 0  . meclizine (ANTIVERT) 25 MG tablet Take 25 mg by mouth 3 (three) times daily as needed for dizziness. Reported on 07/01/2015  1  . ondansetron (ZOFRAN) 8 MG tablet TAKE 1 TABLET (8 MG TOTAL) BY MOUTH EVERY 8 (EIGHT) HOURS AS NEEDED FOR NAUSEA OR VOMITING. (Patient not taking: Reported on 07/01/2015) 20 tablet 0  . sodium chloride (OCEAN) 0.65 % SOLN nasal spray Place 1 spray into both nostrils as needed for congestion. Reported on 07/01/2015     No current facility-administered medications for this visit.     Allergies: No Known Allergies  Past Medical History, Surgical history, Social history, and Family History were reviewed and updated.   Physical Exam: Blood pressure 131/80, pulse 68, temperature 97.5 F (36.4 C), temperature source Oral, resp. rate 18, weight 185 lb (83.915 kg), SpO2 100 %. ECOG: 0 General appearance: Alert, awake gentleman appeared without distress. Head: Normocephalic, without obvious abnormality no oral thrush noted. Neck: no adenopathy no thyroid masses. Lymph nodes: Cervical, supraclavicular, and axillary nodes normal. Heart:regular rate and rhythm, S1, S2  normal, no murmur, click, rub or gallop Lung:chest clear, no wheezing, rales, normal symmetric air entry Abdomin: soft, non-tender, without masses or organomegaly no shifting dullness or ascites. EXT:no erythema, induration, or nodules   Lab Results: Lab Results  Component Value Date   WBC 4.8 07/01/2015   HGB 14.3 07/01/2015    HCT 42.6 07/01/2015   MCV 88.0 07/01/2015   PLT 203 07/01/2015     Chemistry      Component Value Date/Time   NA 142 04/01/2015 0946   NA 140 10/04/2014 0749   K 4.4 04/01/2015 0946   K 3.9 10/04/2014 0749   CL 108 10/04/2014 0749   CO2 25 04/01/2015 0946   CO2 25 10/04/2014 0749   BUN 17.5 04/01/2015 0946   BUN 18 10/04/2014 0749   CREATININE 0.8 04/01/2015 0946   CREATININE 0.85 10/04/2014 0749      Component Value Date/Time   CALCIUM 9.9 04/01/2015 0946   CALCIUM 9.4 10/04/2014 0749   ALKPHOS 77 04/01/2015 0946   ALKPHOS 80 07/09/2014 1153   AST 14 04/01/2015 0946   AST 14 07/09/2014 1153   ALT 13 04/01/2015 0946   ALT 14 07/09/2014 1153   BILITOT 0.42 04/01/2015 0946   BILITOT 0.2* 07/09/2014 1153        Results for Neas, TYREEK KENNEMER (MRN [id number]) as of 07/01/2015 09:47  Ref. Range 09/30/2014 09:59 12/31/2014 09:28 04/01/2015 09:46  PSA Latest Ref Range: <=4.00 ng/mL 0.39 0.39 0.71       Impression and Plan:  70 year old gentleman with the following issues:  1. Advanced prostate cancer presenting with stage IV disease. His Gleason score is 4+5 = 9 with heavy volume disease involving 11 out of 12 cores of his biopsy obtained in October 2015. His PSA is 116 and his staging workup revealed lymphadenopathy in the periaortic and iliac chains. He started androgen depravation under the care of Dr. [name].  He is status post 6 cycles of chemotherapy and currently receiving androgen deprivation. The plan is to continue with androgen deprivation only and and second line hormonal therapy such as Xtandi on [place] upon symptomatic progression.  2. IV access: Port-A-Cath will be removed based on the patient's request. No issues reported since that time.  3. Androgen depravation: This will continued under the care of Dr. [name]. I encouraged to continue that indefinitely.  4. Follow-up: Will be in 3 months sooner if needed to.   SHADAD,FIRAS,MD 4/7/20179:43 AM

## 2015-07-02 LAB — PSA (PARALLEL TESTING): PSA: 4.6 ng/mL — ABNORMAL HIGH (ref <=4.00)

## 2015-07-02 LAB — PSA: PROSTATE SPECIFIC AG, SERUM: 4.5 ng/mL — AB (ref 0.0–4.0)

## 2015-07-04 ENCOUNTER — Other Ambulatory Visit: Payer: Self-pay | Admitting: Oncology

## 2015-07-04 ENCOUNTER — Telehealth: Payer: Self-pay | Admitting: Oncology

## 2015-07-04 DIAGNOSIS — C61 Malignant neoplasm of prostate: Secondary | ICD-10-CM

## 2015-07-04 NOTE — Telephone Encounter (Signed)
spoke w/ pt confrimed appt for april

## 2015-07-19 ENCOUNTER — Encounter (HOSPITAL_COMMUNITY)
Admission: RE | Admit: 2015-07-19 | Discharge: 2015-07-19 | Disposition: A | Discharge status: Home / Self Care | Payer: Medicare Other | Source: Ambulatory Visit | Attending: Oncology | Admitting: Oncology

## 2015-07-19 ENCOUNTER — Other Ambulatory Visit (HOSPITAL_BASED_OUTPATIENT_CLINIC_OR_DEPARTMENT_OTHER): Payer: Medicare Other

## 2015-07-19 ENCOUNTER — Ambulatory Visit (HOSPITAL_COMMUNITY)
Admission: RE | Admit: 2015-07-19 | Discharge: 2015-07-19 | Disposition: A | Discharge status: Home / Self Care | Payer: Medicare Other | Source: Ambulatory Visit | Attending: Oncology | Admitting: Oncology

## 2015-07-19 DIAGNOSIS — R937 Abnormal findings on diagnostic imaging of other parts of musculoskeletal system: Secondary | ICD-10-CM | POA: Insufficient documentation

## 2015-07-19 DIAGNOSIS — Z8546 Personal history of malignant neoplasm of prostate: Secondary | ICD-10-CM | POA: Diagnosis not present

## 2015-07-19 DIAGNOSIS — C61 Malignant neoplasm of prostate: Secondary | ICD-10-CM

## 2015-07-19 DIAGNOSIS — K7689 Other specified diseases of liver: Secondary | ICD-10-CM | POA: Diagnosis not present

## 2015-07-19 DIAGNOSIS — R911 Solitary pulmonary nodule: Secondary | ICD-10-CM | POA: Diagnosis not present

## 2015-07-19 DIAGNOSIS — C7951 Secondary malignant neoplasm of bone: Secondary | ICD-10-CM | POA: Diagnosis not present

## 2015-07-19 DIAGNOSIS — R948 Abnormal results of function studies of other organs and systems: Secondary | ICD-10-CM | POA: Diagnosis not present

## 2015-07-19 MED ORDER — IOPAMIDOL (ISOVUE-300) INJECTION 61%
100.0000 mL | Freq: Once | INTRAVENOUS | Status: AC | PRN
  Administered 2015-07-19: 100 mL via INTRAVENOUS

## 2015-07-19 MED ORDER — TECHNETIUM TC 99M MEDRONATE IV KIT
25.0000 | PACK | Freq: Once | INTRAVENOUS | Status: AC | PRN
  Administered 2015-07-19: 23.8 via INTRAVENOUS

## 2015-07-19 MED ORDER — DIATRIZOATE MEGLUMINE & SODIUM 66-10 % PO SOLN
30.0000 mL | Freq: Once | ORAL | Status: AC
  Administered 2015-07-19: 30 mL via ORAL

## 2015-07-20 LAB — PSA: PROSTATE SPECIFIC AG, SERUM: 5.4 ng/mL — AB (ref 0.0–4.0)

## 2015-07-21 ENCOUNTER — Telehealth: Payer: Self-pay | Admitting: Oncology

## 2015-07-21 ENCOUNTER — Telehealth: Payer: Self-pay | Admitting: Pharmacist

## 2015-07-21 ENCOUNTER — Ambulatory Visit (HOSPITAL_BASED_OUTPATIENT_CLINIC_OR_DEPARTMENT_OTHER): Payer: Medicare Other | Admitting: Oncology

## 2015-07-21 VITALS — BP 164/76 | HR 81 | Temp 98.6°F | Resp 19 | Ht 73.0 in | Wt 184.2 lb

## 2015-07-21 DIAGNOSIS — C61 Malignant neoplasm of prostate: Secondary | ICD-10-CM

## 2015-07-21 MED ORDER — ENZALUTAMIDE 40 MG PO CAPS: 160.0000 mg | ORAL_CAPSULE | Freq: Every day | ORAL | Status: DC

## 2015-07-21 NOTE — Telephone Encounter (Signed)
07/21/15: New Rx for Xtandi faxed to [facility]. Rx requires a prior auth. This has been submitted online through [email or web address]. Will await approval

## 2015-07-21 NOTE — Telephone Encounter (Signed)
Gave pt appt & avs °

## 2015-07-21 NOTE — Progress Notes (Signed)
Hematology and Oncology Follow Up Visit  [name] [id number] Sep 14, 1945 70 y.o. 07/21/2015 10:07 AM   Principle Diagnosis: 70 year old gentleman with prostate cancer diagnosed in October 2015. He had a Gleason score 4+5 = 9 and a PSA of 116. Abdominal imaging showed bulky retroperitoneal lymphadenopathy.   Prior Therapy:  Status post prostate biopsy on 01/19/2014. Systemic chemotherapy in the form of Taxotere 75 mg/m every 3 weeks with plans for total of 6 cycles. Cycle 1 given on 05/05/2014. He is status post 6 cycles of therapy concluded on 08/20/2014.  Current therapy:   Androgen deprivation under the care of Dr. [name]. 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  Interim History:  [name] presents today for a follow-up visit with his wife. Since the last visit, he reports no major changes in his health. He continues to be active and exercises regularly. He has not reported any skeletal complaints of arthralgias or myalgias. Has not reported any urinary symptoms. His appetite remain excellent and does not report any weight loss. He denied any specific bone pain such as back pain, hip pain or shoulder pain.  He does not report any headaches, blurry vision or syncope. He does not report any seizures or any neurological symptoms. He does not report any fevers, chills, weight loss or decline his energy. He does not report any chest pain, palpitation, orthopnea or leg edema. He does not report any cough, hemoptysis or hematemesis. He does not report any nausea, vomiting, abdominal pain, constipation or diarrhea.  Remainder of his review of systems unremarkable.   Medications: I have reviewed the patient's current medications.  Current Outpatient Prescriptions  Medication Sig Dispense Refill  . aspirin 81 MG tablet Take 81 mg by mouth every morning.     . pseudoephedrine (SUDAFED) 30 MG tablet Take 30 mg by mouth as directed.    . enzalutamide (XTANDI) 40 MG  capsule Take 4 capsules (160 mg total) by mouth daily. 120 capsule 0   No current facility-administered medications for this visit.     Allergies: No Known Allergies  Past Medical History, Surgical history, Social history, and Family History were reviewed and updated.   Physical Exam: Blood pressure 164/76, pulse 81, temperature 98.6 F (37 C), temperature source Oral, resp. rate 19, height 6\' 1"  (1.854 m), weight 184 lb 3.2 oz (83.553 kg), SpO2 99 %. ECOG: 0 General appearance: Pleasant-appearing gentleman without distress. Head: Normocephalic, without obvious abnormality no oral thrush noted. Neck: no adenopathy no thyroid masses. Lymph nodes: Cervical, supraclavicular, and axillary nodes normal. Heart:regular rate and rhythm, S1, S2 normal, no murmur, click, rub or gallop Lung:chest clear, no wheezing, rales, normal symmetric air entry Abdomin: soft, non-tender, without masses or organomegaly no rebound or guarding. EXT:no erythema, induration, or nodules   Lab Results: Lab Results  Component Value Date   WBC 4.8 07/01/2015   HGB 14.3 07/01/2015   HCT 42.6 07/01/2015   MCV 88.0 07/01/2015   PLT 203 07/01/2015     Chemistry      Component Value Date/Time   NA 144 07/01/2015 0901   NA 140 10/04/2014 0749   K 4.3 07/01/2015 0901   K 3.9 10/04/2014 0749   CL 108 10/04/2014 0749   CO2 25 07/01/2015 0901   CO2 25 10/04/2014 0749   BUN 14.8 07/01/2015 0901   BUN 18 10/04/2014 0749   CREATININE 0.9 07/01/2015 0901   CREATININE 0.85 10/04/2014 0749      Component Value Date/Time  CALCIUM 10.1 07/01/2015 0901   CALCIUM 9.4 10/04/2014 0749   ALKPHOS 90 07/01/2015 0901   ALKPHOS 80 07/09/2014 1153   AST 14 07/01/2015 0901   AST 14 07/09/2014 1153   ALT 13 07/01/2015 0901   ALT 14 07/09/2014 1153   BILITOT 0.53 07/01/2015 0901   BILITOT 0.2* 07/09/2014 1153       Results for [name], [name] (MRN [id number]) as of 07/21/2015 09:34  Ref. Range 07/01/2015 09:01  07/01/2015 09:01 07/19/2015 09:57  PSA Latest Ref Range: 0.0-4.0 ng/mL 4.60 (H) 4.5 (H) 5.4 (H)    EXAM: CT CHEST, ABDOMEN, AND PELVIS WITH CONTRAST  TECHNIQUE: Multidetector CT imaging of the chest, abdomen and pelvis was performed following the standard protocol during bolus administration of intravenous contrast.  CONTRAST: 171mL ISOVUE-300 IOPAMIDOL (ISOVUE-300) INJECTION 61%  COMPARISON: CT 02/05/2014  FINDINGS: CT CHEST FINDINGS  Mediastinum/Nodes: No axillary or supraclavicular adenopathy. No mediastinal hilar adenopathy. No pericardial fluid. Coronary artery calcifications noted.  Lungs/Pleura: 6 mm nodule in the LEFT lower lobe is unchanged (image 105, series 4).  Musculoskeletal: No aggressive osseous lesion.  CT ABDOMEN AND PELVIS FINDINGS  Hepatobiliary: Multiple low-density lesions in liver consistent with hepatic cysts. Postcholecystectomy. Small gallstones noted  Pancreas: Pancreas is normal. No ductal dilatation. No pancreatic inflammation.  Spleen: Normal spleen  Adrenals/urinary tract: Adrenal glands, kidneys, ureters and bladder normal.  Stomach/Bowel: Stomach, small bowel, appendix, and cecum are normal. The colon and rectosigmoid colon are normal.  Vascular/Lymphatic: Abdominal aorta is normal caliber with atherosclerotic calcification. There is no retroperitoneal or periportal lymphadenopathy. No pelvic lymphadenopathy.  Reproductive: Prostate gland is mildly nodular but nonenlarged. Seminal vesicles grossly normal. No pelvic lymphadenopathy.  Other: A peritoneal metastasis.  Musculoskeletal: Multiple round sclerotic lesions within the spine and pelvis. Sclerotic lesion posterior LEFT iliac bone measuring 13 mm (108, series 2) has associated uptake on nuclear medicine bone scan. Sclerotic lesion in the S1 vertebral body measures 18 mm does not show activity on the bone scan nor does L4 vertebral body lesion. Sclerotic  lesion at T12 on the RIGHT does have associated MDP activity.  IMPRESSION: 1. New skeletal metastasis compared to CT of 02/05/2014. Several sclerotic metastasis have associated MDP uptake on comparison bone scan same day suggesting active metastasis. Other large sclerotic lesions do not have associated MDP uptake suggesting treated metastasis. 2. No evidence of metastatic disease to the soft tissues in the chest abdomen pelvis. 3. Stable LEFT lower lobe pulmonary nodule  NUCLEAR MEDICINE WHOLE BODY BONE SCAN  TECHNIQUE: Whole body anterior and posterior images were obtained approximately 3 hours after intravenous injection of radiopharmaceutical.  RADIOPHARMACEUTICALS: 23.8 MCi Technetium-17m MDP IV  COMPARISON: CT chest, abdomen and pelvis this same day review. Whole-body bone scan 02/05/2014.  FINDINGS: There is linear increased activity in the posterior arc of the right 6th rib. Focal increased activity is also seen in the inferior aspect of the left ilium adjacent to the SI joint. There are corresponding sclerotic lesions on the patient's CT scan in these locations. The patient has thoracolumbar scoliosis. Focal activity to the right of midline at T12-L1 and to the left of midline at approximately L3-4 is likely due to facet degenerative change. Review of the patient's CT scan demonstrates a sclerotic lesion in the S1 vertebral body but no definite increased activity is seen in this location on this study. There is increased activity about the right acromioclavicular joint and about the knees most consistent with degenerative disease.  IMPRESSION: Based on correlation with  CT scan, activity in the right sixth rib and the inferior left ilium are consistent with metastatic deposits. No increased activity is seen in S1 to correlate with the sclerotic lesion seen on CT.     Impression and Plan:  70 year old gentleman with the following issues:  1. Advanced  prostate cancer presenting with stage IV disease. His Gleason score is 4+5 = 9 with heavy volume disease involving 11 out of 12 cores of his biopsy obtained in October 2015. His PSA is 116 and his staging workup revealed lymphadenopathy in the periaortic and iliac chains. He started androgen depravation under the care of Dr. [name].  He is status post 6 cycles of chemotherapy and currently receiving androgen deprivation.   His last PSA increased up to 5.4 and staging workup obtained on 07/19/2015 was reviewed today. His CT scan did not show any evidence of lymphadenopathy or bulky disease. His bone scan is indicating bony metastasis including activity in the sixth rib and the inferior left ilium. 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  The natural course of this disease was discussed today and treatment options were reviewed. Given the fact that he is completely asymptomatic at this time and has very minimal disease we would be reasonable to treat him with hormonal agents such as Xtandi or Zytiga. Risks and benefits of Xtandi were reviewed today complications include fatigue, tiredness and rarely seizures. After discussion today he is agreeable to proceed with this therapy.  2. IV access: Port-A-Cath will be removed based on the patient's request. No issues reported since that time.  3. Androgen depravation: This will continued under the care of Dr. [name]. I encouraged to continue that indefinitely.  4. Bone directed therapy: He will be a candidate for Xgeva after the start of Xtandi to ensure no complications related to that.  5. Follow-up: Will be in one month to assess any complications related to his therapy.   Tina Gruner,MD 4/27/201710:07 AM

## 2015-07-25 MED FILL — *XTANDI 40MG CAPSULE: 40 | 30 days supply | Qty: 120 | Fill #0

## 2015-07-26 ENCOUNTER — Encounter: Payer: Self-pay | Admitting: Oncology

## 2015-07-26 NOTE — Telephone Encounter (Signed)
07/26/15: Rx for Xtandi approved with High copay of $3000. Patient came by to sign paper work for drug assistance through [employer]. Paperwork and financial information faxed to [place] patient support at [phone]. Pt also registered for $6,500 grant with patient advocate foundation so he can get started on Xtandi through [facility] while we wait on manufacturer approval.   Thank you,  Montel Clock, PharmD, [facility]

## 2015-07-27 ENCOUNTER — Telehealth: Payer: Self-pay | Admitting: Pharmacist

## 2015-07-27 NOTE — Telephone Encounter (Signed)
Received call from [facility] with [employer] (Phone:  [phone]). Pt has been approved for a $450 copay (~$6500 grant) through the [employer] [place]. I would need to call [phone], option 17 to get grant approval number. I called the Patient [place] and was instructed to fax W-2 for the household. This patient's account was flagged for audit and I would need to provide proof of income. I faxed W-2 for patient and spouse to Copay Relief Program at [phone].

## 2015-08-08 ENCOUNTER — Telehealth: Payer: Self-pay | Admitting: Pharmacist

## 2015-08-08 NOTE — Telephone Encounter (Signed)
I contacted [employer] over phone and was told pt was approved for [employer]	[place] through [employer].   I contacted [facility] and was told pt picked up his 1st dispensing of Xtandi on 07/25/15 w/ $0 Copay. I called pt by phone and left vm to return my call in [facility] to discuss new start of Xtandi and determine tolerance/compliance, etc. If we haven't heard from pt this week, we'll try again next week. Kennith Center, Pharm.D., CPP 08/08/2015@1 :37 PM [place]

## 2015-08-10 ENCOUNTER — Encounter: Payer: Self-pay | Admitting: Pharmacist

## 2015-08-10 NOTE — Progress Notes (Signed)
Oral Chemotherapy Follow-Up Form  Original Start date of oral chemotherapy: 07/25/15   Called patient today to follow up regarding patient's oral chemotherapy medication: Xtandi  Pt is doing well today. No side effects to report. Pt states he is actually feeling better than before he started the xtandi. No missed doses. Mr. Santoli is still able to exercise and work at his job 35-40 hours a week. Energy levels and appetite are good. Pt has assistance through [place] patient support solutions. Rx is filled through [employer]   Pt reports 0 tablets/doses missed in the last week.    Pt reports the following side effects: None to report    Will follow up and call patient again in 2 weeks   Thank you,  Montel Clock, PharmD, [facility]

## 2015-08-19 ENCOUNTER — Telehealth: Payer: Self-pay | Admitting: Oncology

## 2015-08-19 ENCOUNTER — Other Ambulatory Visit: Payer: Self-pay | Admitting: *Deleted

## 2015-08-19 ENCOUNTER — Ambulatory Visit (HOSPITAL_BASED_OUTPATIENT_CLINIC_OR_DEPARTMENT_OTHER): Payer: Medicare Other | Admitting: Oncology

## 2015-08-19 ENCOUNTER — Other Ambulatory Visit (HOSPITAL_BASED_OUTPATIENT_CLINIC_OR_DEPARTMENT_OTHER): Payer: Medicare Other

## 2015-08-19 VITALS — BP 132/75 | HR 66 | Temp 97.8°F | Resp 16 | Ht 73.0 in | Wt 186.4 lb

## 2015-08-19 DIAGNOSIS — C61 Malignant neoplasm of prostate: Secondary | ICD-10-CM | POA: Diagnosis not present

## 2015-08-19 LAB — CBC WITH DIFFERENTIAL/PLATELET
BASO%: 0.5 % (ref 0.0–2.0)
Basophils Absolute: 0 10*3/uL (ref 0.0–0.1)
EOS ABS: 0.1 10*3/uL (ref 0.0–0.5)
EOS%: 2.8 % (ref 0.0–7.0)
HCT: 41.1 % (ref 38.4–49.9)
HEMOGLOBIN: 13.6 g/dL (ref 13.0–17.1)
LYMPH#: 1.7 10*3/uL (ref 0.9–3.3)
LYMPH%: 39.5 % (ref 14.0–49.0)
MCH: 29.2 pg (ref 27.2–33.4)
MCHC: 33.2 g/dL (ref 32.0–36.0)
MCV: 88 fL (ref 79.3–98.0)
MONO#: 0.4 10*3/uL (ref 0.1–0.9)
MONO%: 9.3 % (ref 0.0–14.0)
NEUT%: 47.9 % (ref 39.0–75.0)
NEUTROS ABS: 2.1 10*3/uL (ref 1.5–6.5)
PLATELETS: 200 10*3/uL (ref 140–400)
RBC: 4.67 10*6/uL (ref 4.20–5.82)
RDW: 13.9 % (ref 11.0–14.6)
WBC: 4.4 10*3/uL (ref 4.0–10.3)

## 2015-08-19 LAB — COMPREHENSIVE METABOLIC PANEL
ALT: 10 U/L (ref 0–55)
AST: 10 U/L (ref 5–34)
Albumin: 4.1 g/dL (ref 3.5–5.0)
Alkaline Phosphatase: 82 U/L (ref 40–150)
Anion Gap: 8 mEq/L (ref 3–11)
BUN: 18 mg/dL (ref 7.0–26.0)
CO2: 26 mEq/L (ref 22–29)
Calcium: 9.8 mg/dL (ref 8.4–10.4)
Chloride: 111 mEq/L — ABNORMAL HIGH (ref 98–109)
Creatinine: 0.9 mg/dL (ref 0.7–1.3)
EGFR: 88 mL/min/{1.73_m2} — ABNORMAL LOW (ref >90)
Glucose: 96 mg/dl (ref 70–140)
Potassium: 4.2 mEq/L (ref 3.5–5.1)
Sodium: 144 mEq/L (ref 136–145)
Total Bilirubin: 0.31 mg/dL (ref 0.20–1.20)
Total Protein: 7 g/dL (ref 6.4–8.3)

## 2015-08-19 MED ORDER — ENZALUTAMIDE 40 MG PO CAPS: 160.0000 mg | ORAL_CAPSULE | Freq: Every day | ORAL | Status: DC

## 2015-08-19 MED FILL — XTANDI 40 MG CAPSULE: 40 | 30 days supply | Qty: 120 | Fill #0

## 2015-08-19 NOTE — Progress Notes (Signed)
Hematology and Oncology Follow Up Visit  [name] [id number] [birth year] 70 y.o. 08/19/2015 3:49 PM   Principle Diagnosis: 70 year old gentleman with prostate cancer diagnosed in October 2015. He had a Gleason score 4+5 = 9 and a PSA of 116. Abdominal imaging showed bulky retroperitoneal lymphadenopathy.   Prior Therapy:  Status post prostate biopsy on 01/19/2014. Systemic chemotherapy in the form of Taxotere 75 mg/m every 3 weeks with plans for total of 6 cycles. Cycle 1 given on 05/05/2014. He is status post 6 cycles of therapy concluded on 08/20/2014.  Current therapy:  Androgen deprivation under the care of Dr. [name]. Xtandi 160 mg daily started on 07/25/2015.  Interim History:  [name] presents today for a follow-up visit with his wife. Since the last visit, he started [facility] for the last 4 weeks as tolerated well. He denied any nausea, abdominal pain and lower extremity edema. He continues to be active and exercises regularly. He has not reported any skeletal complaints of arthralgias or myalgias. Has not reported any urinary symptoms. His appetite remain normal and does not report any weight loss.  He does not report any headaches, blurry vision or syncope. He does not report any seizures or any neurological symptoms. He does not report any fevers, chills, weight loss or decline his energy. He does not report any chest pain, palpitation, orthopnea or leg edema. He does not report any cough, hemoptysis or hematemesis. He does not report any nausea, vomiting, abdominal pain, constipation or diarrhea.  Remainder of his review of systems unremarkable.   Medications: I have reviewed the patient's current medications.  Current Outpatient Prescriptions  Medication Sig Dispense Refill  . aspirin 81 MG tablet Take 81 mg by mouth every morning.     . diphenhydramine-acetaminophen (TYLENOL PM) 25-500 MG TABS tablet Take 2 tablets by mouth at bedtime as needed.    . pseudoephedrine  (SUDAFED) 30 MG tablet Take 30 mg by mouth as directed.    . enzalutamide (XTANDI) 40 MG capsule Take 4 capsules (160 mg total) by mouth daily. 120 capsule 0   No current facility-administered medications for this visit.     Allergies: No Known Allergies  Past Medical History, Surgical history, Social history, and Family History were reviewed and updated.   Physical Exam: Blood pressure 132/75, pulse 66, temperature 97.8 F (36.6 C), temperature source Oral, resp. rate 16, height 6\' 1"  (1.854 m), weight 186 lb 6.4 oz (84.55 kg), SpO2 100 %. ECOG: 0 General appearance: Well-appearing gentleman without distress. Head: Normocephalic, without obvious abnormality no oral ulcers or lesions. Neck: no adenopathy no thyroid masses. Lymph nodes: Cervical, supraclavicular, and axillary nodes normal. Heart:regular rate and rhythm, S1, S2 normal, no murmur, click, rub or gallop Lung:chest clear, no wheezing, rales, normal symmetric air entry Abdomin: soft, non-tender, without masses or organomegaly no shifting dullness or ascites. EXT:no erythema, induration, or nodules   Lab Results: Lab Results  Component Value Date   WBC 4.4 08/19/2015   HGB 13.6 08/19/2015   HCT 41.1 08/19/2015   MCV 88.0 08/19/2015   PLT 200 08/19/2015     Chemistry      Component Value Date/Time   NA 144 08/19/2015 1513   NA 140 10/04/2014 0749   K 4.2 08/19/2015 1513   K 3.9 10/04/2014 0749   CL 108 10/04/2014 0749   CO2 26 08/19/2015 1513   CO2 25 10/04/2014 0749   BUN 18.0 08/19/2015 1513   BUN 18 10/04/2014 0749   CREATININE 0.9  08/19/2015 1513   CREATININE 0.85 10/04/2014 0749      Component Value Date/Time   CALCIUM 9.8 08/19/2015 1513   CALCIUM 9.4 10/04/2014 0749   ALKPHOS 82 08/19/2015 1513   ALKPHOS 80 07/09/2014 1153   AST 10 08/19/2015 1513   AST 14 07/09/2014 1153   ALT 10 08/19/2015 1513   ALT 14 07/09/2014 1153   BILITOT 0.31 08/19/2015 1513   BILITOT 0.2* 07/09/2014 1153           Impression and Plan:  70 year old gentleman with the following issues:  1. Advanced prostate cancer presenting with stage IV disease. His Gleason score is 4+5 = 9 with heavy volume disease involving 11 out of 12 cores of his biopsy obtained in October 2015. His PSA is 116 and his staging workup revealed lymphadenopathy in the periaortic and iliac chains. He started androgen depravation under the care of Dr. [name].  He is status post 6 cycles of chemotherapy and currently receiving androgen deprivation.   His last PSA increased up to 5.4 and staging workup on 04/25/2017with bone scan is indicating bony metastasis including activity in the sixth rib and the inferior left ilium. 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  He started on Xtandi on 07/25/2015 and have tolerated it well. The plan is to continue the same dose and schedule without any dose reduction or delay. His PSA is currently pending and we will monitored on a monthly basis.  2. IV access: Port-A-Cath will be removed based on the patient's request. No issues reported since that time.  3. Androgen depravation: This will continued under the care of Dr. [name]. I encouraged to continue that indefinitely.  4. Bone directed therapy: He will be a candidate for Xgeva after dental clearance.  5. Follow-up: Will be in one month to me issues related to this medication.   [name],MD 5/26/20173:49 PM

## 2015-08-19 NOTE — Telephone Encounter (Signed)
Gave pt apt & avs °

## 2015-08-20 LAB — PSA: Prostate Specific Ag, Serum: 2.2 ng/mL (ref 0.0–4.0)

## 2015-08-23 ENCOUNTER — Telehealth: Payer: Self-pay | Admitting: *Deleted

## 2015-08-23 NOTE — Telephone Encounter (Signed)
Spoke with patient, gave results of last PSA 

## 2015-08-23 NOTE — Telephone Encounter (Signed)
-----   Message from Wyatt Portela, MD sent at 08/23/2015  8:32 AM EDT ----- Please call him with his last PSA. Down this time.

## 2015-08-25 ENCOUNTER — Telehealth: Payer: Self-pay | Admitting: Pharmacist

## 2015-08-25 NOTE — Telephone Encounter (Signed)
Left vm from [facility] to f/u on Xtandi.   Gave pt our phone number to return call if having issues/concerns/problems re: [name]. Will f/u 09/16/15 when seeing MD. Kennith Center, Pharm.D., CPP 08/25/2015@3 :[facility]

## 2015-09-16 ENCOUNTER — Other Ambulatory Visit: Payer: Self-pay | Admitting: *Deleted

## 2015-09-16 ENCOUNTER — Ambulatory Visit (HOSPITAL_BASED_OUTPATIENT_CLINIC_OR_DEPARTMENT_OTHER): Payer: Medicare Other | Admitting: Oncology

## 2015-09-16 ENCOUNTER — Other Ambulatory Visit (HOSPITAL_BASED_OUTPATIENT_CLINIC_OR_DEPARTMENT_OTHER): Payer: Medicare Other

## 2015-09-16 ENCOUNTER — Telehealth: Payer: Self-pay | Admitting: Oncology

## 2015-09-16 VITALS — BP 150/76 | HR 63 | Temp 97.8°F | Resp 18 | Ht 73.0 in | Wt 185.3 lb

## 2015-09-16 DIAGNOSIS — C61 Malignant neoplasm of prostate: Secondary | ICD-10-CM

## 2015-09-16 DIAGNOSIS — C7951 Secondary malignant neoplasm of bone: Secondary | ICD-10-CM | POA: Diagnosis not present

## 2015-09-16 LAB — COMPREHENSIVE METABOLIC PANEL
ALBUMIN: 3.9 g/dL (ref 3.5–5.0)
ALT: 11 U/L (ref 0–55)
AST: 11 U/L (ref 5–34)
Alkaline Phosphatase: 90 U/L (ref 40–150)
Anion Gap: 9 mEq/L (ref 3–11)
BUN: 15.1 mg/dL (ref 7.0–26.0)
CO2: 22 mEq/L (ref 22–29)
CREATININE: 0.8 mg/dL (ref 0.7–1.3)
Calcium: 9.8 mg/dL (ref 8.4–10.4)
Chloride: 112 mEq/L — ABNORMAL HIGH (ref 98–109)
EGFR: >90 mL/min/{1.73_m2} (ref >90)
GLUCOSE: 103 mg/dL (ref 70–140)
POTASSIUM: 4.2 meq/L (ref 3.5–5.1)
SODIUM: 143 meq/L (ref 136–145)
TOTAL PROTEIN: 6.9 g/dL (ref 6.4–8.3)
Total Bilirubin: 0.41 mg/dL (ref 0.20–1.20)

## 2015-09-16 LAB — CBC WITH DIFFERENTIAL/PLATELET
BASO%: 0 % (ref 0.0–2.0)
Basophils Absolute: 0 10*3/uL (ref 0.0–0.1)
EOS%: 2.8 % (ref 0.0–7.0)
Eosinophils Absolute: 0.1 10*3/uL (ref 0.0–0.5)
HEMATOCRIT: 41.2 % (ref 38.4–49.9)
HEMOGLOBIN: 14.4 g/dL (ref 13.0–17.1)
LYMPH#: 1.6 10*3/uL (ref 0.9–3.3)
LYMPH%: 45.1 % (ref 14.0–49.0)
MCH: 30.3 pg (ref 27.2–33.4)
MCHC: 35 g/dL (ref 32.0–36.0)
MCV: 86.7 fL (ref 79.3–98.0)
MONO#: 0.4 10*3/uL (ref 0.1–0.9)
MONO%: 9.8 % (ref 0.0–14.0)
NEUT%: 42.3 % (ref 39.0–75.0)
NEUTROS ABS: 1.5 10*3/uL (ref 1.5–6.5)
Platelets: 190 10*3/uL (ref 140–400)
RBC: 4.75 10*6/uL (ref 4.20–5.82)
RDW: 13.5 % (ref 11.0–14.6)
WBC: 3.6 10*3/uL — AB (ref 4.0–10.3)

## 2015-09-16 MED ORDER — ENZALUTAMIDE 40 MG PO CAPS: 160.0000 mg | ORAL_CAPSULE | Freq: Every day | ORAL | Status: DC

## 2015-09-16 MED FILL — *XTANDI 40MG CAPSULE: 40 | 30 days supply | Qty: 120 | Fill #0

## 2015-09-16 NOTE — Telephone Encounter (Signed)
Gave and printed appt sched and avs for pt for Aug °

## 2015-09-16 NOTE — Progress Notes (Signed)
Hematology and Oncology Follow Up Visit  [name] [id number] Dec 21, 1945 70 y.o. 09/16/2015 10:13 AM   Principle Diagnosis: 70 year old gentleman with prostate cancer diagnosed in October 2015. He had a Gleason score 4+5 = 9 and a PSA of 116. Abdominal imaging showed bulky retroperitoneal lymphadenopathy.   Prior Therapy:  Status post prostate biopsy on 01/19/2014. Systemic chemotherapy in the form of Taxotere 75 mg/m every 3 weeks with plans for total of 6 cycles. Cycle 1 given on 05/05/2014. He is status post 6 cycles of therapy concluded on 08/20/2014.  Current therapy:  Androgen deprivation under the care of Dr. [name]. Xtandi 160 mg daily started on 07/25/2015.  Interim History:  [name] presents today for a follow-up visit with his wife. Since the last visit, he reports no major changes in his health. He does report some occasional sinus pressure and takes Sudafed periodically with some relief. He denied any blurry vision, headaches or neurological deficits.   He continues to take [facility] without any new complications. He denied any nausea, abdominal pain and lower extremity edema. He continues to be active and exercises regularly. He runs close to 20 miles on a regular basis. He has not reported any skeletal complaints of arthralgias or myalgias. Has not reported any urinary symptoms. His appetite remain normal and his weight is about the same.  He does not report any headaches, blurry vision or syncope. He does not report any seizures or any neurological symptoms. He does not report any fevers, chills, weight loss or decline his energy. He does not report any chest pain, palpitation, orthopnea or leg edema. He does not report any cough, hemoptysis or hematemesis. He does not report any nausea, vomiting, abdominal pain, constipation or diarrhea.  Remainder of his review of systems unremarkable.   Medications: I have reviewed the patient's current medications.  Current Outpatient  Prescriptions  Medication Sig Dispense Refill  . aspirin 81 MG tablet Take 81 mg by mouth every morning.     . diphenhydramine-acetaminophen (TYLENOL PM) 25-500 MG TABS tablet Take 2 tablets by mouth at bedtime as needed.    . enzalutamide (XTANDI) 40 MG capsule Take 4 capsules (160 mg total) by mouth daily. 120 capsule 0  . pseudoephedrine (SUDAFED) 30 MG tablet Take 30 mg by mouth as directed.     No current facility-administered medications for this visit.     Allergies: No Known Allergies  Past Medical History, Surgical history, Social history, and Family History were reviewed and updated.   Physical Exam: Blood pressure 150/76, pulse 63, temperature 97.8 F (36.6 C), temperature source Oral, resp. rate 18, height 6\' 1"  (1.854 m), weight 185 lb 4.8 oz (84.052 kg), SpO2 100 %. ECOG: 0 General appearance: Well-appearing gentleman without distress. Head: Normocephalic, without obvious abnormality no oral ulcers or lesions. Neck: no adenopathy no thyroid masses. Lymph nodes: Cervical, supraclavicular, and axillary nodes normal. Heart:regular rate and rhythm, S1, S2 normal, no murmur, click, rub or gallop Lung:chest clear, no wheezing, rales, normal symmetric air entry Abdomin: soft, non-tender, without masses or organomegaly no shifting dullness or ascites. EXT:no erythema, induration, or nodules   Lab Results: Lab Results  Component Value Date   WBC 3.6* 09/16/2015   HGB 14.4 09/16/2015   HCT 41.2 09/16/2015   MCV 86.7 09/16/2015   PLT 190 09/16/2015     Chemistry      Component Value Date/Time   NA 144 08/19/2015 1513   NA 140 10/04/2014 0749   K 4.2 08/19/2015  1513   K 3.9 10/04/2014 0749   CL 108 10/04/2014 0749   CO2 26 08/19/2015 1513   CO2 25 10/04/2014 0749   BUN 18.0 08/19/2015 1513   BUN 18 10/04/2014 0749   CREATININE 0.9 08/19/2015 1513   CREATININE 0.85 10/04/2014 0749      Component Value Date/Time   CALCIUM 9.8 08/19/2015 1513   CALCIUM 9.4  10/04/2014 0749   ALKPHOS 82 08/19/2015 1513   ALKPHOS 80 07/09/2014 1153   AST 10 08/19/2015 1513   AST 14 07/09/2014 1153   ALT 10 08/19/2015 1513   ALT 14 07/09/2014 1153   BILITOT 0.31 08/19/2015 1513   BILITOT 0.2* 07/09/2014 1153      Results for [name], [name] (MRN [id number]) as of 09/16/2015 10:01  Ref. Range 07/19/2015 09:57 08/19/2015 15:13  PSA Latest Ref Range: 0.0-4.0 ng/mL 5.4 (H) 2.2      Impression and Plan:  70 year old gentleman with the following issues:  1. Advanced prostate cancer presenting with stage IV disease. His Gleason score is 4+5 = 9 with heavy volume disease involving 11 out of 12 cores of his biopsy obtained in October 2015. His PSA is 116 and his staging workup revealed lymphadenopathy in the periaortic and iliac chains. He started androgen depravation under the care of Dr. [name].  He is status post 6 cycles of chemotherapy and currently receiving androgen deprivation.   His last PSA increased up to 5.4 and staging workup on 07/19/2015 with bone scan is indicating bony metastasis including activity in the sixth rib and the inferior left ilium. 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  He started on Xtandi on 07/25/2015 and have tolerated it well. His PSA responded favorably after one month of therapy dropping to 2.2. The plan is continue with the same dose and schedule without any dose reduction or delay.  2. IV access: Port-A-Cath will be removed based on the patient's request. No issues reported since that time.  3. Androgen depravation: This will continued under the care of Dr. [name]. No complications noted since the last visit,  4. Bone directed therapy: He will be a candidate for Xgeva once dental clearance has been completed.  5. Follow-up: Will be in one month to me issues related to this medication.   [name] Amundson,MD 6/23/201710:13 AM

## 2015-09-17 LAB — PSA: PROSTATE SPECIFIC AG, SERUM: 2.1 ng/mL (ref 0.0–4.0)

## 2015-09-19 ENCOUNTER — Telehealth: Payer: Self-pay | Admitting: *Deleted

## 2015-09-19 NOTE — Telephone Encounter (Signed)
As noted below by Dr. Shadad, I informed patient of his PSA level. Patient verbalized understanding. 

## 2015-09-19 NOTE — Telephone Encounter (Signed)
-----   Message from Wyatt Portela, MD sent at 09/19/2015  8:41 AM EDT ----- Please let him know his PSA is down.

## 2015-10-06 ENCOUNTER — Ambulatory Visit: Payer: Medicare Other | Admitting: Oncology

## 2015-10-06 ENCOUNTER — Other Ambulatory Visit: Payer: Medicare Other

## 2015-10-18 ENCOUNTER — Telehealth: Payer: Self-pay

## 2015-10-18 NOTE — Telephone Encounter (Signed)
Pt expecting new grandchild and wanted status of Tdap and also if tdap needed could pt get a pharmacy. Pt is due for Tdap per immunization record and pt was advised that pt can get at pharmacy if prefers pt voiced understanding.

## 2015-10-19 DIAGNOSIS — Z23 Encounter for immunization: Secondary | ICD-10-CM | POA: Diagnosis not present

## 2015-10-21 ENCOUNTER — Other Ambulatory Visit: Payer: Self-pay | Admitting: *Deleted

## 2015-10-21 ENCOUNTER — Telehealth: Payer: Self-pay | Admitting: *Deleted

## 2015-10-21 DIAGNOSIS — C61 Malignant neoplasm of prostate: Secondary | ICD-10-CM

## 2015-10-21 MED ORDER — ENZALUTAMIDE 40 MG PO CAPS: 160.0000 mg | ORAL_CAPSULE | Freq: Every day | ORAL | 0 refills | Status: AC

## 2015-10-21 MED FILL — *XTANDI 40MG CAPSULE: 40 | 30 days supply | Qty: 120 | Fill #0

## 2015-10-21 NOTE — Telephone Encounter (Signed)
Spoke with patient let him knowhis xtandi script would be ready for p/u @ [facility] 10/24/15 after 11:00 am.

## 2015-10-27 ENCOUNTER — Telehealth: Payer: Self-pay | Admitting: Oncology

## 2015-10-27 ENCOUNTER — Ambulatory Visit (HOSPITAL_BASED_OUTPATIENT_CLINIC_OR_DEPARTMENT_OTHER): Payer: Medicare Other | Admitting: Oncology

## 2015-10-27 ENCOUNTER — Other Ambulatory Visit (HOSPITAL_BASED_OUTPATIENT_CLINIC_OR_DEPARTMENT_OTHER): Payer: Medicare Other

## 2015-10-27 VITALS — BP 137/75 | HR 63 | Temp 98.1°F | Resp 18 | Wt 182.1 lb

## 2015-10-27 DIAGNOSIS — C61 Malignant neoplasm of prostate: Secondary | ICD-10-CM

## 2015-10-27 DIAGNOSIS — E291 Testicular hypofunction: Secondary | ICD-10-CM

## 2015-10-27 LAB — CBC WITH DIFFERENTIAL/PLATELET
BASO%: 0.2 % (ref 0.0–2.0)
Basophils Absolute: 0 10*3/uL (ref 0.0–0.1)
EOS ABS: 0.1 10*3/uL (ref 0.0–0.5)
EOS%: 2.1 % (ref 0.0–7.0)
HCT: 41.7 % (ref 38.4–49.9)
HGB: 14.4 g/dL (ref 13.0–17.1)
LYMPH%: 40 % (ref 14.0–49.0)
MCH: 29.9 pg (ref 27.2–33.4)
MCHC: 34.5 g/dL (ref 32.0–36.0)
MCV: 86.5 fL (ref 79.3–98.0)
MONO#: 0.3 10*3/uL (ref 0.1–0.9)
MONO%: 7.1 % (ref 0.0–14.0)
NEUT%: 50.6 % (ref 39.0–75.0)
NEUTROS ABS: 2.1 10*3/uL (ref 1.5–6.5)
PLATELETS: 188 10*3/uL (ref 140–400)
RBC: 4.82 10*6/uL (ref 4.20–5.82)
RDW: 13.7 % (ref 11.0–14.6)
WBC: 4.2 10*3/uL (ref 4.0–10.3)
lymph#: 1.7 10*3/uL (ref 0.9–3.3)

## 2015-10-27 LAB — COMPREHENSIVE METABOLIC PANEL
ALBUMIN: 4.2 g/dL (ref 3.5–5.0)
ALK PHOS: 94 U/L (ref 40–150)
ALT: 12 U/L (ref 0–55)
ANION GAP: 10 meq/L (ref 3–11)
AST: 12 U/L (ref 5–34)
BILIRUBIN TOTAL: 0.43 mg/dL (ref 0.20–1.20)
BUN: 13.9 mg/dL (ref 7.0–26.0)
CALCIUM: 10.4 mg/dL (ref 8.4–10.4)
CO2: 23 meq/L (ref 22–29)
Chloride: 110 mEq/L — ABNORMAL HIGH (ref 98–109)
Creatinine: 0.8 mg/dL (ref 0.7–1.3)
Glucose: 95 mg/dl (ref 70–140)
Potassium: 4.3 mEq/L (ref 3.5–5.1)
Sodium: 143 mEq/L (ref 136–145)
TOTAL PROTEIN: 7.3 g/dL (ref 6.4–8.3)

## 2015-10-27 NOTE — Telephone Encounter (Signed)
Gv pt appt for 8/30.

## 2015-10-27 NOTE — Progress Notes (Signed)
Hematology and Oncology Follow Up Visit  [name] [id number] 24-Oct-1945 70 y.o. 10/27/2015 11:55 AM   Principle Diagnosis: 70 year old gentleman with prostate cancer diagnosed in October 2015. He had a Gleason score 4+5 = 9 and a PSA of 116. Abdominal imaging showed bulky retroperitoneal lymphadenopathy.   Prior Therapy:  Status post prostate biopsy on 01/19/2014. Systemic chemotherapy in the form of Taxotere 75 mg/m every 3 weeks with plans for total of 6 cycles. Cycle 1 given on 05/05/2014. He is status post 6 cycles of therapy concluded on 08/20/2014.  Current therapy:  Androgen deprivation under the care of Dr. [name]. He receives Lupron every 6 months. Xtandi 160 mg daily started on 07/25/2015.  Interim History:  [name] presents today for a follow-up visit with his wife. Since the last visit, he continues to do well without any major changes. He remains active and attends to activities of daily living. He exercises regularly and is intentionally lost 3 pounds.  He continues to take [facility] without any new complications. He denied any nausea, abdominal pain and lower extremity edema. He has not reported any skeletal complaints of arthralgias or myalgias. Has not reported any urinary symptoms. He remains in excellent shape with excellent quality of life.  He does not report any headaches, blurry vision or syncope. He does not report any seizures or any neurological symptoms. He does not report any fevers, chills, weight loss or decline his energy. He does not report any chest pain, palpitation, orthopnea or leg edema. He does not report any cough, hemoptysis or hematemesis. He does not report any nausea, vomiting, abdominal pain, constipation or diarrhea.  Remainder of his review of systems unremarkable.   Medications: I have reviewed the patient's current medications.  Current Outpatient Prescriptions  Medication Sig Dispense Refill  . aspirin 81 MG tablet Take 81 mg by mouth every  morning.     . diphenhydramine-acetaminophen (TYLENOL PM) 25-500 MG TABS tablet Take 2 tablets by mouth at bedtime as needed.    . enzalutamide (XTANDI) 40 MG capsule Take 4 capsules (160 mg total) by mouth daily. 120 capsule 0  . pseudoephedrine (SUDAFED) 30 MG tablet Take 30 mg by mouth as directed.     No current facility-administered medications for this visit.      Allergies: No Known Allergies  Past Medical History, Surgical history, Social history, and Family History were reviewed and updated.   Physical Exam: Blood pressure 137/75, pulse 63, temperature 98.1 F (36.7 C), temperature source Oral, resp. rate 18, weight 182 lb 1.6 oz (82.6 kg), SpO2 100 %. ECOG: 0 General appearance: Alert, awake gentleman without distress. Head: Normocephalic, without obvious abnormality no oral thrush noted. Neck: no adenopathy no thyroid masses. Lymph nodes: Cervical, supraclavicular, and axillary nodes normal. Heart:regular rate and rhythm, S1, S2 normal, no murmur, click, rub or gallop Lung:chest clear, no wheezing, rales, normal symmetric air entry no dullness to percussion. Abdomin: soft, non-tender, without masses or organomegaly no rebound or guarding. EXT:no erythema, induration, or nodules   Lab Results: Lab Results  Component Value Date   WBC 4.2 10/27/2015   HGB 14.4 10/27/2015   HCT 41.7 10/27/2015   MCV 86.5 10/27/2015   PLT 188 10/27/2015     Chemistry      Component Value Date/Time   NA 143 09/16/2015 0947   K 4.2 09/16/2015 0947   CL 108 10/04/2014 0749   CO2 22 09/16/2015 0947   BUN 15.1 09/16/2015 0947   CREATININE 0.8 09/16/2015  [id number]      Component Value Date/Time   CALCIUM 9.8 09/16/2015 0947   ALKPHOS 90 09/16/2015 0947   AST 11 09/16/2015 0947   ALT 11 09/16/2015 0947   BILITOT 0.41 09/16/2015 0947      Results for [name], [name] (MRN [id number]) as of 10/27/2015 11:42  Ref. Range 07/19/2015 09:57 08/19/2015 15:13 09/16/2015 09:47  PSA Latest Ref Range:  0.0 - 4.0 ng/mL 5.4 (H) 2.2 2.1       Impression and Plan:  70 year old gentleman with the following issues:  1. Advanced prostate cancer presenting with stage IV disease. His Gleason score is 4+5 = 9 with heavy volume disease involving 11 out of 12 cores of his biopsy obtained in October 2015. His PSA is 116 and his staging workup revealed lymphadenopathy in the periaortic and iliac chains. He started androgen depravation under the care of Dr. [name].  He is status post 6 cycles of chemotherapy and currently receiving androgen deprivation.   His last PSA increased up to 5.4 and staging workup on 07/19/2015 with bone scan is indicating bony metastasis including activity in the sixth rib and the inferior left ilium. 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  He started on Xtandi on 07/25/2015 and have tolerated it well. His PSA Remains reasonably low at 2.1 without any major complications related to this medication. The plan is to continue the same dose and schedule for the time being.  2. IV access: Port-A-Cath will be removed based on the patient's request. No issues reported since that time.  3. Androgen depravation: This will continued under the care of Dr. [name]. No complications noted. I advised that he would continue this indefinitely.  4. Bone directed therapy: There are Delton See was reviewed again today and the risks and benefits were discussed. His bone disease has been limited and complications associated with this medication were reviewed and for the time being we have deferred starting Xgeva to a later date. He develops more extensive bone disease, he will need dental clearance before proceeding with Xgeva.  5. Follow-up: Will be in one month to me issues related to this medication.   [name] Burgo,MD 8/3/201711:55 AM

## 2015-10-28 ENCOUNTER — Telehealth: Payer: Self-pay | Admitting: *Deleted

## 2015-10-28 LAB — PSA: Prostate Specific Ag, Serum: 2.2 ng/mL (ref 0.0–4.0)

## 2015-10-28 NOTE — Telephone Encounter (Signed)
-----   Message from Wyatt Portela, MD sent at 10/28/2015  8:43 AM EDT ----- Please let him know his PSA has not changed much.

## 2015-10-28 NOTE — Telephone Encounter (Signed)
Spoke with patient, gave results of last PSA 

## 2015-10-31 ENCOUNTER — Encounter: Payer: Self-pay | Admitting: Internal Medicine

## 2015-10-31 DIAGNOSIS — H9190 Unspecified hearing loss, unspecified ear: Secondary | ICD-10-CM

## 2015-11-04 DIAGNOSIS — C61 Malignant neoplasm of prostate: Secondary | ICD-10-CM | POA: Diagnosis not present

## 2015-11-11 DIAGNOSIS — C61 Malignant neoplasm of prostate: Secondary | ICD-10-CM | POA: Diagnosis not present

## 2015-11-11 DIAGNOSIS — C775 Secondary and unspecified malignant neoplasm of intrapelvic lymph nodes: Secondary | ICD-10-CM | POA: Diagnosis not present

## 2015-11-11 DIAGNOSIS — R972 Elevated prostate specific antigen [PSA]: Secondary | ICD-10-CM | POA: Diagnosis not present

## 2015-11-11 DIAGNOSIS — C7951 Secondary malignant neoplasm of bone: Secondary | ICD-10-CM | POA: Diagnosis not present

## 2015-11-23 ENCOUNTER — Other Ambulatory Visit (HOSPITAL_BASED_OUTPATIENT_CLINIC_OR_DEPARTMENT_OTHER): Payer: Medicare Other

## 2015-11-23 ENCOUNTER — Telehealth: Payer: Self-pay | Admitting: Oncology

## 2015-11-23 ENCOUNTER — Ambulatory Visit (HOSPITAL_BASED_OUTPATIENT_CLINIC_OR_DEPARTMENT_OTHER): Payer: Medicare Other | Admitting: Oncology

## 2015-11-23 VITALS — BP 144/82 | HR 73 | Temp 97.8°F | Resp 18 | Wt 181.3 lb

## 2015-11-23 DIAGNOSIS — C7951 Secondary malignant neoplasm of bone: Secondary | ICD-10-CM | POA: Diagnosis not present

## 2015-11-23 DIAGNOSIS — C61 Malignant neoplasm of prostate: Secondary | ICD-10-CM

## 2015-11-23 LAB — COMPREHENSIVE METABOLIC PANEL WITH GFR
ALT: <9 U/L (ref 0–55)
AST: 14 U/L (ref 5–34)
Albumin: 3.9 g/dL (ref 3.5–5.0)
Alkaline Phosphatase: 91 U/L (ref 40–150)
Anion Gap: 8 meq/L (ref 3–11)
BUN: 13.7 mg/dL (ref 7.0–26.0)
CO2: 25 meq/L (ref 22–29)
Calcium: 9.6 mg/dL (ref 8.4–10.4)
Chloride: 108 meq/L (ref 98–109)
Creatinine: 0.8 mg/dL (ref 0.7–1.3)
EGFR: >90 ml/min/1.73 m2
Glucose: 108 mg/dL (ref 70–140)
Potassium: 3.9 meq/L (ref 3.5–5.1)
Sodium: 141 meq/L (ref 136–145)
Total Bilirubin: 0.43 mg/dL (ref 0.20–1.20)
Total Protein: 7.1 g/dL (ref 6.4–8.3)

## 2015-11-23 LAB — CBC WITH DIFFERENTIAL/PLATELET
BASO%: 0.4 % (ref 0.0–2.0)
Basophils Absolute: 0 10*3/uL (ref 0.0–0.1)
EOS%: 2.9 % (ref 0.0–7.0)
Eosinophils Absolute: 0.1 10*3/uL (ref 0.0–0.5)
HEMATOCRIT: 41.3 % (ref 38.4–49.9)
HEMOGLOBIN: 14 g/dL (ref 13.0–17.1)
LYMPH#: 1.5 10*3/uL (ref 0.9–3.3)
LYMPH%: 34.6 % (ref 14.0–49.0)
MCH: 29.6 pg (ref 27.2–33.4)
MCHC: 33.8 g/dL (ref 32.0–36.0)
MCV: 87.6 fL (ref 79.3–98.0)
MONO#: 0.4 10*3/uL (ref 0.1–0.9)
MONO%: 9.3 % (ref 0.0–14.0)
NEUT#: 2.3 10*3/uL (ref 1.5–6.5)
NEUT%: 52.8 % (ref 39.0–75.0)
Platelets: 222 10*3/uL (ref 140–400)
RBC: 4.72 10*6/uL (ref 4.20–5.82)
RDW: 13.4 % (ref 11.0–14.6)
WBC: 4.3 10*3/uL (ref 4.0–10.3)

## 2015-11-23 MED ORDER — XTANDI 40 MG PO CAPS: 160.0000 mg | ORAL_CAPSULE | Freq: Every day | ORAL | 0 refills | Status: DC

## 2015-11-23 MED ORDER — CALCIUM CARBONATE-VITAMIN D 500-200 MG-UNIT PO TABS: 1.0000 | ORAL_TABLET | Freq: Two times a day (BID) | ORAL | 3 refills | Status: DC

## 2015-11-23 MED FILL — *XTANDI 40MG CAPSULE: 40 | 30 days supply | Qty: 120 | Fill #0

## 2015-11-23 NOTE — Progress Notes (Signed)
Hematology and Oncology Follow Up Visit  [name] [id number] [birth year] 70 y.o. 11/23/2015 4:18 PM   Principle Diagnosis: 70 year old gentleman with prostate cancer diagnosed in October 2015. He had a Gleason score 4+5 = 9 and a PSA of 116. Abdominal imaging showed bulky retroperitoneal lymphadenopathy.   Prior Therapy:  Status post prostate biopsy on 01/19/2014. Systemic chemotherapy in the form of Taxotere 75 mg/m every 3 weeks with plans for total of 6 cycles. Cycle 1 given on 05/05/2014. He is status post 6 cycles of therapy concluded on 08/20/2014.  Current therapy:  Androgen deprivation under the care of Dr. [name]. He receives Lupron every 6 months. Xtandi 160 mg daily started on 07/25/2015.  Interim History:  [name] presents today for a follow-up visit with his wife. Since the last visit, he reports no changes in his health. He continues to be asymptomatic from his cancer. He denied any constitutional symptoms of weight loss or bone pain. He remains active and attends to activities of daily living. He exercises regularly almost on a daily basis and continues to have excellent stamina.  He continues to take [facility] without any new complications. He denied any nausea, abdominal pain and lower extremity edema. He has not reported any skeletal complaints of arthralgias or myalgias. He received Lupron therapy recently and have tolerated it well without any hot flashes.  He does not report any headaches, blurry vision or syncope. He does not report any seizures or any neurological symptoms. He does not report any fevers, chills, weight loss or decline his energy. He does not report any chest pain, palpitation, orthopnea or leg edema. He does not report any cough, hemoptysis or hematemesis. He does not report any nausea, vomiting, abdominal pain, constipation or diarrhea.  Remainder of his review of systems unremarkable.   Medications: I have reviewed the patient's current medications.   Current Outpatient Prescriptions  Medication Sig Dispense Refill  . aspirin 81 MG tablet Take 81 mg by mouth every morning.     . diphenhydramine-acetaminophen (TYLENOL PM) 25-500 MG TABS tablet Take 2 tablets by mouth at bedtime as needed.    . pseudoephedrine (SUDAFED) 30 MG tablet Take 30 mg by mouth as directed.    [name] 40 MG capsule Take 4 capsules (160 mg total) by mouth daily. 120 capsule 0  . calcium-vitamin D (OSCAL WITH D) 500-200 MG-UNIT tablet Take 1 tablet by mouth 2 (two) times daily. 60 tablet 3   No current facility-administered medications for this visit.      Allergies: No Known Allergies  Past Medical History, Surgical history, Social history, and Family History were reviewed and updated.   Physical Exam: Blood pressure (!) 144/82, pulse 73, temperature 97.8 F (36.6 C), temperature source Oral, resp. rate 18, weight 181 lb 4.8 oz (82.2 kg), SpO2 99 %. ECOG: 0 General appearance: Well-appearing gentleman without distress.. Head: Normocephalic, without obvious abnormality no oral ulcers or lesions. Neck: no adenopathy no thyroid masses. Lymph nodes: Cervical, supraclavicular, and axillary nodes normal. Heart:regular rate and rhythm, S1, S2 normal, no murmur, click, rub or gallop Lung:chest clear, no wheezing, rales, normal symmetric air entry no dullness to percussion. Abdomin: soft, non-tender, without masses or organomegaly no shifting dullness or ascites. EXT:no erythema, induration, or nodules   Lab Results: Lab Results  Component Value Date   WBC 4.3 11/23/2015   HGB 14.0 11/23/2015   HCT 41.3 11/23/2015   MCV 87.6 11/23/2015   PLT 222 11/23/2015     Chemistry  Component Value Date/Time   NA 141 11/23/2015 1514   K 3.9 11/23/2015 1514   CL 108 10/04/2014 0749   CO2 25 11/23/2015 1514   BUN 13.7 11/23/2015 1514   CREATININE 0.8 11/23/2015 1514      Component Value Date/Time   CALCIUM 9.6 11/23/2015 1514   ALKPHOS 91 11/23/2015 1514    AST 14 11/23/2015 1514   ALT <9 11/23/2015 1514   BILITOT 0.43 11/23/2015 1514       Results for [name], [name] (MRN [id number]) as of 11/23/2015 15:31  Ref. Range 08/19/2015 15:13 09/16/2015 09:47 10/27/2015 11:32  PSA Latest Ref Range: 0.0 - 4.0 ng/mL 2.2 2.1 2.2       Impression and Plan:  70 year old gentleman with the following issues:  1. Advanced prostate cancer presenting with stage IV disease. His Gleason score is 4+5 = 9 with heavy volume disease involving 11 out of 12 cores of his biopsy obtained in October 2015. His PSA is 116 and his staging workup revealed lymphadenopathy in the periaortic and iliac chains. He started androgen depravation under the care of Dr. [name].  He is status post 6 cycles of chemotherapy and currently receiving androgen deprivation.    PSA increased up to 5.4 and staging workup on 07/19/2015 with bone scan is indicating bony metastasis including activity in the sixth rib and the inferior left ilium. 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  He started on Xtandi on 07/25/2015 and have tolerated it well. His PSA continues to be reasonably controlled without any signs and symptoms of cancer progression. The plan is to continue the same dose and schedule and repeat staging workup if he develop rapid rise in his PSA.  2. IV access: Port-A-Cath will be removed based on the patient's request. No issues reported since that time.  3. Androgen depravation: This will continued under the care of Dr. [name]. The plan is to continue indefinitely at this time.  4. Bone directed therapy: He has limited bone disease at this time and I have recommended starting calcium and vitamin D supplements. We will consider Delton See he develops worsening bony metastasis. Risks and benefits of this medication were discussed today including complications such as hypocalcemia and osteonecrosis of the jaw. This will be deferred as  mentioned to a later date.  5. Follow-up: Will be in one month    Shahiem Bedwell,MD 8/30/20174:18 PM

## 2015-11-23 NOTE — Telephone Encounter (Signed)
GAVE PATIENT AVS REPORT AND APPOINTMENTS FOR October  °

## 2015-11-25 ENCOUNTER — Other Ambulatory Visit: Payer: Self-pay | Admitting: Otolaryngology

## 2015-11-25 DIAGNOSIS — E041 Nontoxic single thyroid nodule: Secondary | ICD-10-CM

## 2015-11-25 DIAGNOSIS — H903 Sensorineural hearing loss, bilateral: Secondary | ICD-10-CM | POA: Diagnosis not present

## 2015-11-25 DIAGNOSIS — H9319 Tinnitus, unspecified ear: Secondary | ICD-10-CM | POA: Diagnosis not present

## 2015-12-01 ENCOUNTER — Ambulatory Visit: Payer: Medicare Other

## 2015-12-09 DIAGNOSIS — X32XXXA Exposure to sunlight, initial encounter: Secondary | ICD-10-CM | POA: Diagnosis not present

## 2015-12-09 DIAGNOSIS — Z08 Encounter for follow-up examination after completed treatment for malignant neoplasm: Secondary | ICD-10-CM | POA: Diagnosis not present

## 2015-12-09 DIAGNOSIS — L570 Actinic keratosis: Secondary | ICD-10-CM | POA: Diagnosis not present

## 2015-12-09 DIAGNOSIS — Z85828 Personal history of other malignant neoplasm of skin: Secondary | ICD-10-CM | POA: Diagnosis not present

## 2015-12-09 DIAGNOSIS — L821 Other seborrheic keratosis: Secondary | ICD-10-CM | POA: Diagnosis not present

## 2015-12-19 ENCOUNTER — Encounter: Payer: Self-pay | Admitting: *Deleted

## 2015-12-19 ENCOUNTER — Other Ambulatory Visit: Payer: Self-pay | Admitting: *Deleted

## 2015-12-19 DIAGNOSIS — C61 Malignant neoplasm of prostate: Secondary | ICD-10-CM

## 2015-12-19 MED ORDER — XTANDI 40 MG PO CAPS: 160.0000 mg | ORAL_CAPSULE | Freq: Every day | ORAL | 0 refills | Status: DC

## 2015-12-19 MED FILL — *XTANDI 40MG CAPSULE: 40 | 30 days supply | Qty: 120 | Fill #0

## 2015-12-27 ENCOUNTER — Telehealth: Payer: Self-pay | Admitting: Oncology

## 2015-12-27 ENCOUNTER — Other Ambulatory Visit (HOSPITAL_BASED_OUTPATIENT_CLINIC_OR_DEPARTMENT_OTHER): Payer: Medicare Other

## 2015-12-27 ENCOUNTER — Ambulatory Visit (HOSPITAL_BASED_OUTPATIENT_CLINIC_OR_DEPARTMENT_OTHER): Payer: Medicare Other | Admitting: Oncology

## 2015-12-27 VITALS — BP 138/77 | HR 72 | Temp 97.8°F | Resp 20 | Ht 73.0 in | Wt 181.3 lb

## 2015-12-27 DIAGNOSIS — C61 Malignant neoplasm of prostate: Secondary | ICD-10-CM

## 2015-12-27 LAB — CBC WITH DIFFERENTIAL/PLATELET
BASO%: 0.5 % (ref 0.0–2.0)
BASOS ABS: 0 10*3/uL (ref 0.0–0.1)
EOS%: 2.4 % (ref 0.0–7.0)
Eosinophils Absolute: 0.1 10*3/uL (ref 0.0–0.5)
HEMATOCRIT: 43.7 % (ref 38.4–49.9)
HGB: 14.5 g/dL (ref 13.0–17.1)
LYMPH#: 1.6 10*3/uL (ref 0.9–3.3)
LYMPH%: 36.9 % (ref 14.0–49.0)
MCH: 29.4 pg (ref 27.2–33.4)
MCHC: 33.3 g/dL (ref 32.0–36.0)
MCV: 88.2 fL (ref 79.3–98.0)
MONO#: 0.4 10*3/uL (ref 0.1–0.9)
MONO%: 9.1 % (ref 0.0–14.0)
NEUT#: 2.1 10*3/uL (ref 1.5–6.5)
NEUT%: 51.1 % (ref 39.0–75.0)
PLATELETS: 226 10*3/uL (ref 140–400)
RBC: 4.95 10*6/uL (ref 4.20–5.82)
RDW: 13.5 % (ref 11.0–14.6)
WBC: 4.2 10*3/uL (ref 4.0–10.3)

## 2015-12-27 LAB — COMPREHENSIVE METABOLIC PANEL
ALT: 12 U/L (ref 0–55)
ANION GAP: 10 meq/L (ref 3–11)
AST: 11 U/L (ref 5–34)
Albumin: 3.9 g/dL (ref 3.5–5.0)
Alkaline Phosphatase: 101 U/L (ref 40–150)
BUN: 15.6 mg/dL (ref 7.0–26.0)
CALCIUM: 10.1 mg/dL (ref 8.4–10.4)
CHLORIDE: 109 meq/L (ref 98–109)
CO2: 25 meq/L (ref 22–29)
CREATININE: 0.8 mg/dL (ref 0.7–1.3)
EGFR: >90 mL/min/{1.73_m2} (ref >90)
Glucose: 101 mg/dl (ref 70–140)
POTASSIUM: 4.1 meq/L (ref 3.5–5.1)
Sodium: 143 mEq/L (ref 136–145)
Total Bilirubin: 0.49 mg/dL (ref 0.20–1.20)
Total Protein: 7.3 g/dL (ref 6.4–8.3)

## 2015-12-27 NOTE — Progress Notes (Signed)
Hematology and Oncology Follow Up Visit  [name] [id number] [birth year] 70 y.o. 12/27/2015 10:38 AM   Principle Diagnosis: 70 year old gentleman with prostate cancer diagnosed in October 2015. He had a Gleason score 4+5 = 9 and a PSA of 116. Abdominal imaging showed bulky retroperitoneal lymphadenopathy.   Prior Therapy:  Status post prostate biopsy on 01/19/2014. Systemic chemotherapy in the form of Taxotere 75 mg/m every 3 weeks with plans for total of 6 cycles. Cycle 1 given on 05/05/2014. He is status post 6 cycles of therapy concluded on 08/20/2014.  Current therapy:  Androgen deprivation under the care of Dr. [name]. He receives Lupron every 6 months. Xtandi 160 mg daily started on 07/25/2015.  Interim History:  [name] presents today for a follow-up visit with his wife. Since the last visit, he reports no complaints. He continues to take [facility] without any new complications. He denied any nausea, abdominal pain and lower extremity edema. He has not reported any skeletal complaints of arthralgias or myalgias. He remains active and performs activities of daily living also exercises periodically.  He continues to have calcium and vitamin D supplements without any bone pain or pathological fractures.  He does not report any headaches, blurry vision or syncope. He does not report any seizures or any neurological symptoms. He does not report any fevers, chills, weight loss or decline his energy. He does not report any chest pain, palpitation, orthopnea or leg edema. He does not report any cough, hemoptysis or hematemesis. He does not report any nausea, vomiting, abdominal pain, constipation or diarrhea.  Remainder of his review of systems unremarkable.   Medications: I have reviewed the patient's current medications.  Current Outpatient Prescriptions  Medication Sig Dispense Refill  . aspirin 81 MG tablet Take 81 mg by mouth every morning.     . calcium-vitamin D (OSCAL WITH D)  500-200 MG-UNIT tablet Take 1 tablet by mouth 2 (two) times daily. 60 tablet 3  . diphenhydramine-acetaminophen (TYLENOL PM) 25-500 MG TABS tablet Take 2 tablets by mouth at bedtime as needed.    . pseudoephedrine (SUDAFED) 30 MG tablet Take 30 mg by mouth as directed.    Gillermina Phy 40 MG capsule Take 4 capsules (160 mg total) by mouth daily. 120 capsule 0   No current facility-administered medications for this visit.      Allergies: No Known Allergies  Past Medical History, Surgical history, Social history, and Family History were reviewed and updated.   Physical Exam: Blood pressure 138/77, pulse 72, temperature 97.8 F (36.6 C), temperature source Oral, resp. rate 20, height 6\' 1"  (1.854 m), weight 181 lb 4.8 oz (82.2 kg), SpO2 100 %. ECOG: 0 General appearance: Alert, awake gentleman without distress. Head: Normocephalic, without obvious abnormality no oral ulcers or lesions. Neck: no adenopathy no thyroid masses. Lymph nodes: Cervical, supraclavicular, and axillary nodes normal. Heart:regular rate and rhythm, S1, S2 normal, no murmur, click, rub or gallop Lung:chest clear, no wheezing, rales, normal symmetric air entry no dullness to percussion. Abdomin: soft, non-tender, without masses or organomegaly no rebound or guarding. EXT:no erythema, induration, or nodules   Lab Results: Lab Results  Component Value Date   WBC 4.2 12/27/2015   HGB 14.5 12/27/2015   HCT 43.7 12/27/2015   MCV 88.2 12/27/2015   PLT 226 12/27/2015     Chemistry      Component Value Date/Time   NA 141 11/23/2015 1514   K 3.9 11/23/2015 1514   CL 108 10/04/2014 0749  CO2 25 11/23/2015 1514   BUN 13.7 11/23/2015 1514   CREATININE 0.8 11/23/2015 1514      Component Value Date/Time   CALCIUM 9.6 11/23/2015 1514   ALKPHOS 91 11/23/2015 1514   AST 14 11/23/2015 1514   ALT <9 11/23/2015 1514   BILITOT 0.43 11/23/2015 1514       Results for Mashaw, [name] (MRN [id number]) as of 12/27/2015  10:23  Ref. Range 09/16/2015 09:47 10/27/2015 11:32  PSA Latest Ref Range: 0.0 - 4.0 ng/mL 2.1 2.2        Impression and Plan:  69 year old gentleman with the following issues:  1. Advanced prostate cancer presenting with stage IV disease. His Gleason score is 4+5 = 9 with heavy volume disease involving 11 out of 12 cores of his biopsy obtained in October 2015. His PSA is 116 and his staging workup revealed lymphadenopathy in the periaortic and iliac chains. He started androgen depravation under the care of Dr. [name].  He is status post 6 cycles of chemotherapy and currently receiving androgen deprivation.    PSA increased up to 5.4 and staging workup on 07/19/2015 with bone scan is indicating bony metastasis including activity in the sixth rib and the inferior left ilium. 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  He started on Xtandi on 07/25/2015 and have tolerated it well. His PSA Remains unchanged around 2.2 and the last 5 months. The plan is to continue with the same dose and schedule and is different salvage therapy upon symptomatic progression.  2. IV access: Port-A-Cath will be removed based on the patient's request. No issues reported since that time.  3. Androgen depravation: This will continued under the care of Dr. [name]. The plan is to continue indefinitely at this time.  4. Bone directed therapy: He is currently on calcium and vitamin D supplements. Risks and benefits of Xgeva were reviewed today and he would be a reasonable candidate for it. I anticipate the start of this medication with the next visit after he obtains dental clearance. He scheduled an appointment with his dentist next week.  5. Follow-up: Will be in one month    Takeru Bose,MD 10/3/201710:38 AM

## 2015-12-27 NOTE — Telephone Encounter (Signed)
Gv pt appts for 02/02/16.

## 2015-12-28 ENCOUNTER — Encounter: Payer: Self-pay | Admitting: Internal Medicine

## 2015-12-28 ENCOUNTER — Ambulatory Visit (INDEPENDENT_AMBULATORY_CARE_PROVIDER_SITE_OTHER): Payer: Medicare Other | Admitting: Internal Medicine

## 2015-12-28 ENCOUNTER — Telehealth: Payer: Self-pay | Admitting: *Deleted

## 2015-12-28 VITALS — BP 130/84 | HR 65 | Temp 98.1°F | Ht 71.5 in | Wt 178.0 lb

## 2015-12-28 DIAGNOSIS — Z23 Encounter for immunization: Secondary | ICD-10-CM

## 2015-12-28 DIAGNOSIS — J301 Allergic rhinitis due to pollen: Secondary | ICD-10-CM | POA: Diagnosis not present

## 2015-12-28 DIAGNOSIS — Z0000 Encounter for general adult medical examination without abnormal findings: Secondary | ICD-10-CM

## 2015-12-28 DIAGNOSIS — C61 Malignant neoplasm of prostate: Secondary | ICD-10-CM

## 2015-12-28 DIAGNOSIS — H8110 Benign paroxysmal vertigo, unspecified ear: Secondary | ICD-10-CM

## 2015-12-28 DIAGNOSIS — Z7189 Other specified counseling: Secondary | ICD-10-CM

## 2015-12-28 LAB — PSA: Prostate Specific Ag, Serum: 3.3 ng/mL (ref 0.0–4.0)

## 2015-12-28 NOTE — Assessment & Plan Note (Signed)
I have personally reviewed the Medicare Annual Wellness questionnaire and have noted 1. The patient's medical and social history 2. Their use of alcohol, tobacco or illicit drugs 3. Their current medications and supplements 4. The patient's functional ability including ADL's, fall risks, home safety risks and hearing or visual             impairment. 5. Diet and physical activities 6. Evidence for depression or mood disorders  The patients weight, height, BMI and visual acuity have been recorded in the chart I have made referrals, counseling and provided education to the patient based review of the above and I have provided the pt with a written personalized care plan for preventive services.  I have provided you with a copy of your personalized plan for preventive services. Please take the time to review along with your updated medication list.  Will defer colon cancer screening due to advanced prostate cancer Stays fit Flu vaccine today

## 2015-12-28 NOTE — Progress Notes (Signed)
Pre visit review using our clinic review tool, if applicable. No additional management support is needed unless otherwise documented below in the visit note. 

## 2015-12-28 NOTE — Addendum Note (Signed)
Addended by: Pilar Grammes on: 12/28/2015 01:21 PM   Modules accepted: Orders

## 2015-12-28 NOTE — Assessment & Plan Note (Signed)
occ symptoms Usually quick so hasn't needed meclizine in some time

## 2015-12-28 NOTE — Telephone Encounter (Signed)
As noted below by Dr. Shadad, I informed patient of his PSA level. Patient verbalized understanding. 

## 2015-12-28 NOTE — Assessment & Plan Note (Signed)
See social history 

## 2015-12-28 NOTE — Telephone Encounter (Signed)
-----   Message from Wyatt Portela, MD sent at 12/28/2015  8:37 AM EDT ----- Please let him know his PSA results. Little increase but no major changes.

## 2015-12-28 NOTE — Assessment & Plan Note (Signed)
Has responded to [employer] with Dr [name]

## 2015-12-28 NOTE — Assessment & Plan Note (Signed)
May be mold sensitive Discussed antihistamines and flonase

## 2015-12-28 NOTE — Progress Notes (Signed)
Subjective:    Patient ID: [name], male    DOB: 01-26-1946, 70 y.o.   MRN: [id number]  HPI Here for Medicare wellness and follow up of chronic health conditions Reviewed form and advanced directives Reviewed other doctors--just for the prostate cancer No alcohol or tobacco Exercises regularly--but more tired in the evening than in the past No falls No depression or  Vision is fine. 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 aspirin 81 MG tablet Take 81 mg by mouth every morning.     . calcium-vitamin D (OSCAL WITH D) 500-200 MG-UNIT tablet Take 1 tablet by mouth 2 (two) times daily. 60 tablet 3  . diphenhydramine-acetaminophen (TYLENOL PM) 25-500 MG TABS tablet Take 2 tablets by mouth at bedtime as needed.    . pseudoephedrine (SUDAFED) 30 MG tablet Take 30 mg by mouth as directed.    Gillermina Phy 40 MG capsule Take 4 capsules (160 mg total) by mouth daily. 120 capsule 0   No current facility-administered medications on file prior to visit.     No Known Allergies  Past Medical History:  Diagnosis Date  . Diverticulosis of colon   . Hx of colonic polyps   . Prostate cancer [facility])     Past Surgical History:  Procedure Laterality Date  . CARDIOVASCULAR STRESS TEST  1996   Negative    Family History  Problem Relation Age of Onset  . Heart attack Father   . Heart disease Father   . Heart attack Brother 69  . Heart disease Brother   . Heart attack Brother   . Heart disease Brother   . Cancer  Sister     breast  . Hypertension Mother   . Diabetes Sister   . Cancer Maternal Aunt   . Cancer Maternal Aunt   . Asthma Son     Social History   Social History  . Marital status: Married    Spouse name: N/A  . Number of children: 3  . Years of education: N/A   Occupational History  . cancer registry reporting    Social History Main Topics  . Smoking status: Former Smoker    Packs/day: 1.00    Years: 10.00    Types: Cigarettes    Quit date: 03/26/1990  . Smokeless tobacco: Never Used  . Alcohol use Not on file  . Drug use: Unknown  . Sexual activity: Not on file   Other Topics Concern  . Not on file   Social History Narrative   Has living will   Wife is health care POA.   Would accept resuscitation    No tube feeds if cognitively unaware.   Review of Systems Appetite is fine Weight is down just slightly Sleep is okay--but has nocturia x 2 No daytime problems--urgency, etc. Flow is okay Wears seat belt Teeth are okay--- keeps up with dentist (Dr Altha Harm) No chest pain or SOB No dizziness or syncope. Will get occasional vertigo--like if he looks straight up No edema Bowels are okay. No  blood in stool. No skin problems    Objective:   Physical Exam  Constitutional: He is oriented to person, place, and time. He appears well-developed and well-nourished. No distress.  HENT:  Mouth/Throat: Oropharynx is clear and moist. No oropharyngeal exudate.  Eyes: Conjunctivae are normal. Pupils are equal, round, and reactive to light.  Neck: Normal range of motion. Neck supple. No thyromegaly present.  Cardiovascular: Normal rate, regular rhythm, normal heart sounds and intact distal pulses.  Exam reveals no gallop.   No murmur heard. Pulmonary/Chest: Effort normal and breath sounds normal. No respiratory distress. He has no wheezes. He has no rales.  Abdominal: Soft. There is no tenderness.  Musculoskeletal: He exhibits no edema or tenderness.  Lymphadenopathy:     He has no cervical adenopathy.  Neurological: He is alert and oriented to person, place, and time.  President-- "Daisy Floro, Obama, Bush" [phone] [id number] Recall 3/3  Skin: No rash noted. No erythema.  Psychiatric: He has a normal mood and affect. His behavior is normal.          Assessment & Plan:

## 2016-01-16 ENCOUNTER — Other Ambulatory Visit: Payer: Self-pay | Admitting: *Deleted

## 2016-01-16 DIAGNOSIS — C61 Malignant neoplasm of prostate: Secondary | ICD-10-CM

## 2016-01-16 MED ORDER — XTANDI 40 MG PO CAPS: 160.0000 mg | ORAL_CAPSULE | Freq: Every day | ORAL | 0 refills | Status: DC

## 2016-01-16 MED FILL — *XTANDI 40MG CAPSULE: 40 | 30 days supply | Qty: 120 | Fill #0

## 2016-02-02 ENCOUNTER — Ambulatory Visit (HOSPITAL_BASED_OUTPATIENT_CLINIC_OR_DEPARTMENT_OTHER): Payer: Medicare Other

## 2016-02-02 ENCOUNTER — Other Ambulatory Visit (HOSPITAL_BASED_OUTPATIENT_CLINIC_OR_DEPARTMENT_OTHER): Payer: Medicare Other

## 2016-02-02 ENCOUNTER — Telehealth: Payer: Self-pay | Admitting: Oncology

## 2016-02-02 ENCOUNTER — Ambulatory Visit (HOSPITAL_BASED_OUTPATIENT_CLINIC_OR_DEPARTMENT_OTHER): Payer: Medicare Other | Admitting: Oncology

## 2016-02-02 VITALS — BP 170/78 | HR 65 | Temp 97.6°F | Resp 18 | Ht 71.5 in | Wt 183.1 lb

## 2016-02-02 DIAGNOSIS — C61 Malignant neoplasm of prostate: Secondary | ICD-10-CM

## 2016-02-02 DIAGNOSIS — E291 Testicular hypofunction: Secondary | ICD-10-CM | POA: Diagnosis not present

## 2016-02-02 DIAGNOSIS — C7951 Secondary malignant neoplasm of bone: Secondary | ICD-10-CM

## 2016-02-02 LAB — COMPREHENSIVE METABOLIC PANEL
ALT: 11 U/L (ref 0–55)
AST: 12 U/L (ref 5–34)
Albumin: 3.9 g/dL (ref 3.5–5.0)
Alkaline Phosphatase: 108 U/L (ref 40–150)
Anion Gap: 9 mEq/L (ref 3–11)
BILIRUBIN TOTAL: 0.44 mg/dL (ref 0.20–1.20)
BUN: 14.4 mg/dL (ref 7.0–26.0)
CO2: 26 meq/L (ref 22–29)
CREATININE: 0.8 mg/dL (ref 0.7–1.3)
Calcium: 10.2 mg/dL (ref 8.4–10.4)
Chloride: 109 mEq/L (ref 98–109)
EGFR: >90 mL/min/{1.73_m2} (ref >90)
GLUCOSE: 96 mg/dL (ref 70–140)
Potassium: 4.3 mEq/L (ref 3.5–5.1)
SODIUM: 143 meq/L (ref 136–145)
TOTAL PROTEIN: 7.2 g/dL (ref 6.4–8.3)

## 2016-02-02 LAB — CBC WITH DIFFERENTIAL/PLATELET
BASO%: 0.3 % (ref 0.0–2.0)
Basophils Absolute: 0 10*3/uL (ref 0.0–0.1)
EOS%: 2.7 % (ref 0.0–7.0)
Eosinophils Absolute: 0.1 10*3/uL (ref 0.0–0.5)
HCT: 42.5 % (ref 38.4–49.9)
HGB: 14.5 g/dL (ref 13.0–17.1)
LYMPH%: 44.6 % (ref 14.0–49.0)
MCH: 29.7 pg (ref 27.2–33.4)
MCHC: 34.1 g/dL (ref 32.0–36.0)
MCV: 86.9 fL (ref 79.3–98.0)
MONO#: 0.4 10*3/uL (ref 0.1–0.9)
MONO%: 10.3 % (ref 0.0–14.0)
NEUT%: 42.1 % (ref 39.0–75.0)
NEUTROS ABS: 1.6 10*3/uL (ref 1.5–6.5)
Platelets: 194 10*3/uL (ref 140–400)
RBC: 4.89 10*6/uL (ref 4.20–5.82)
RDW: 13.1 % (ref 11.0–14.6)
WBC: 3.7 10*3/uL — AB (ref 4.0–10.3)
lymph#: 1.7 10*3/uL (ref 0.9–3.3)

## 2016-02-02 MED ORDER — DENOSUMAB 120 MG/1.7ML ~~LOC~~ SOLN
120.0000 mg | Freq: Once | SUBCUTANEOUS | Status: AC
  Administered 2016-02-02: 120 mg via SUBCUTANEOUS
  Filled 2016-02-02: qty 1.7

## 2016-02-02 NOTE — Progress Notes (Unsigned)
Dr. [name]  Stated that patient has had dental clearance for Xgeva.

## 2016-02-02 NOTE — Telephone Encounter (Signed)
Gave patient avs report and appointments for December  °

## 2016-02-02 NOTE — Patient Instructions (Signed)
Denosumab injection  What is this medicine?  DENOSUMAB (den oh sue mab) slows bone breakdown. Prolia is used to treat osteoporosis in women after menopause and in men. Xgeva is used to prevent bone fractures and other bone problems caused by cancer bone metastases. Xgeva is also used to treat giant cell tumor of the bone.  This medicine may be used for other purposes; ask your health care provider or pharmacist if you have questions.  What should I tell my health care provider before I take this medicine?  They need to know if you have any of these conditions:  -dental disease  -eczema  -infection or history of infections  -kidney disease or on dialysis  -low blood calcium or vitamin D  -malabsorption syndrome  -scheduled to have surgery or tooth extraction  -taking medicine that contains denosumab  -thyroid or parathyroid disease  -an unusual reaction to denosumab, other medicines, foods, dyes, or preservatives  -pregnant or trying to get pregnant  -breast-feeding  How should I use this medicine?  This medicine is for injection under the skin. It is given by a health care professional in a hospital or clinic setting.  If you are getting Prolia, a special MedGuide will be given to you by the pharmacist with each prescription and refill. Be sure to read this information carefully each time.  For Prolia, talk to your pediatrician regarding the use of this medicine in children. Special care may be needed. For Xgeva, talk to your pediatrician regarding the use of this medicine in children. While this drug may be prescribed for children as young as 13 years for selected conditions, precautions do apply.  Overdosage: If you think you have taken too much of this medicine contact a poison control center or emergency room at once.  NOTE: This medicine is only for you. Do not share this medicine with others.  What if I miss a dose?  It is important not to miss your dose. Call your doctor or health care professional if you are  unable to keep an appointment.  What may interact with this medicine?  Do not take this medicine with any of the following medications:  -other medicines containing denosumab  This medicine may also interact with the following medications:  -medicines that suppress the immune system  -medicines that treat cancer  -steroid medicines like prednisone or cortisone  This list may not describe all possible interactions. Give your health care provider a list of all the medicines, herbs, non-prescription drugs, or dietary supplements you use. Also tell them if you smoke, drink alcohol, or use illegal drugs. Some items may interact with your medicine.  What should I watch for while using this medicine?  Visit your doctor or health care professional for regular checks on your progress. Your doctor or health care professional may order blood tests and other tests to see how you are doing.  Call your doctor or health care professional if you get a cold or other infection while receiving this medicine. Do not treat yourself. This medicine may decrease your body's ability to fight infection.  You should make sure you get enough calcium and vitamin D while you are taking this medicine, unless your doctor tells you not to. Discuss the foods you eat and the vitamins you take with your health care professional.  See your dentist regularly. Brush and floss your teeth as directed. Before you have any dental work done, tell your dentist you are receiving this medicine.  Do   not become pregnant while taking this medicine or for 5 months after stopping it. Women should inform their doctor if they wish to become pregnant or think they might be pregnant. There is a potential for serious side effects to an unborn child. Talk to your health care professional or pharmacist for more information.  What side effects may I notice from receiving this medicine?  Side effects that you should report to your doctor or health care professional as soon as  possible:  -allergic reactions like skin rash, itching or hives, swelling of the face, lips, or tongue  -breathing problems  -chest pain  -fast, irregular heartbeat  -feeling faint or lightheaded, falls  -fever, chills, or any other sign of infection  -muscle spasms, tightening, or twitches  -numbness or tingling  -skin blisters or bumps, or is dry, peels, or red  -slow healing or unexplained pain in the mouth or jaw  -unusual bleeding or bruising  Side effects that usually do not require medical attention (Report these to your doctor or health care professional if they continue or are bothersome.):  -muscle pain  -stomach upset, gas  This list may not describe all possible side effects. Call your doctor for medical advice about side effects. You may report side effects to FDA at 1-800-FDA-1088.  Where should I keep my medicine?  This medicine is only given in a clinic, doctor's office, or other health care setting and will not be stored at home.  NOTE: This sheet is a summary. It may not cover all possible information. If you have questions about this medicine, talk to your doctor, pharmacist, or health care provider.      2016, Elsevier/Gold Standard. (2011-09-10 12:37:47)

## 2016-02-02 NOTE — Progress Notes (Signed)
Hematology and Oncology Follow Up Visit  [name] [id number] 15-Oct-1945 70 y.o. 02/02/2016 10:13 AM   Principle Diagnosis: 70 year old gentleman with prostate cancer diagnosed in October 2015. He had a Gleason score 4+5 = 9 and a PSA of 116. Abdominal imaging showed bulky retroperitoneal lymphadenopathy.   Prior Therapy:  Status post prostate biopsy on 01/19/2014. Systemic chemotherapy in the form of Taxotere 75 mg/m every 3 weeks with plans for total of 6 cycles. Cycle 1 given on 05/05/2014. He is status post 6 cycles of therapy concluded on 08/20/2014.  Current therapy:  Androgen deprivation under the care of Dr. [name]. He receives Lupron every 6 months. Xtandi 160 mg daily started on 07/25/2015. Xgeva monthly injections to start on 02/02/2016.  Interim History:  [name] presents today for a follow-up visit with his wife. Since the last visit, he reports no changes in his health and reports feeling well.  He continues to take [facility] without any new complications. He denied any nausea, abdominal pain and lower extremity edema. He has not reported any skeletal complaints of arthralgias or myalgias. He remains active and performs activities of daily living including working full time and exercising regularly. His exercise is strenuous at times and does report some slight fatigue afterwards.  He continues to have calcium and vitamin D supplements without any bone pain or pathological fractures. He denied any dental complications.  He does not report any headaches, blurry vision or syncope. He does not report any seizures or any neurological symptoms. He does not report any fevers, chills, weight loss or decline his energy. He does not report any chest pain, palpitation, orthopnea or leg edema. He does not report any cough, hemoptysis or hematemesis. He does not report any nausea, vomiting, abdominal pain, constipation or diarrhea.  Remainder of his review of systems unremarkable.    Medications: I have reviewed the patient's current medications.  Current Outpatient Prescriptions  Medication Sig Dispense Refill  . aspirin 81 MG tablet Take 81 mg by mouth every morning.     . calcium-vitamin D (OSCAL WITH D) 500-200 MG-UNIT tablet Take 1 tablet by mouth 2 (two) times daily. 60 tablet 3  . diphenhydramine-acetaminophen (TYLENOL PM) 25-500 MG TABS tablet Take 2 tablets by mouth at bedtime as needed.    . pseudoephedrine (SUDAFED) 30 MG tablet Take 30 mg by mouth as directed.    Gillermina Phy 40 MG capsule Take 4 capsules (160 mg total) by mouth daily. 120 capsule 0   No current facility-administered medications for this visit.    Facility-Administered Medications Ordered in Other Visits  Medication Dose Route Frequency Provider Last Rate Last Dose  . denosumab (XGEVA) injection 120 mg  120 mg Subcutaneous Once Wyatt Portela, MD         Allergies: No Known Allergies  Past Medical History, Surgical history, Social history, and Family History were reviewed and updated.   Physical Exam: Blood pressure (!) 170/78, pulse 65, temperature 97.6 F (36.4 C), temperature source Oral, resp. rate 18, height 5' 11.5" (1.816 m), weight 183 lb 1.6 oz (83.1 kg), SpO2 100 %. ECOG: 0 General appearance: Well-appearing gentleman without distress. Head: Normocephalic, without obvious abnormality no oral thrush noted. Neck: no adenopathy no thyroid masses. Lymph nodes: Cervical, supraclavicular, and axillary nodes normal. Heart:regular rate and rhythm, S1, S2 normal, no murmur, click, rub or gallop Lung:chest clear, no wheezing, rales, normal symmetric air entry no dullness to percussion. Abdomin: soft, non-tender, without masses or organomegaly no shifting dullness  or ascites. EXT:no erythema, induration, or nodules   Lab Results: Lab Results  Component Value Date   WBC 4.2 12/27/2015   HGB 14.5 12/27/2015   HCT 43.7 12/27/2015   MCV 88.2 12/27/2015   PLT 226 12/27/2015      Chemistry      Component Value Date/Time   NA 143 12/27/2015 1009   K 4.1 12/27/2015 1009   CL 108 10/04/2014 0749   CO2 25 12/27/2015 1009   BUN 15.6 12/27/2015 1009   CREATININE 0.8 12/27/2015 1009      Component Value Date/Time   CALCIUM 10.1 12/27/2015 1009   ALKPHOS 101 12/27/2015 1009   AST 11 12/27/2015 1009   ALT 12 12/27/2015 1009   BILITOT 0.49 12/27/2015 1009        Results for Straw, [name] (MRN [id number]) as of 02/02/2016 09:51  Ref. Range 09/16/2015 09:47 10/27/2015 11:32 12/27/2015 10:09  PSA Latest Ref Range: 0.0 - 4.0 ng/mL 2.1 2.2 3.3       Impression and Plan:  69 year old gentleman with the following issues:  1. Advanced prostate cancer presenting with stage IV disease. His Gleason score is 4+5 = 9 with heavy volume disease involving 11 out of 12 cores of his biopsy obtained in October 2015. His PSA is 116 and his staging workup revealed lymphadenopathy in the periaortic and iliac chains. He started androgen depravation under the care of Dr. [name].  He is status post 6 cycles of chemotherapy and currently receiving androgen deprivation.    PSA increased up to 5.4 and staging workup on 07/19/2015 with bone scan is indicating bony metastasis including activity in the sixth rib and the inferior left ilium. 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  He started on Xtandi on 07/25/2015 and have tolerated it well. His PSA Remains under reasonable control although it did increase slightly to 3.3. At this time, I have recommended continuing Xtandi with the same dose and schedule and restage him with CT scan and bone scan in January 2018. Different salvage therapy will be considered if he continues to develop a rise in his PSA.  2. IV access: Port-A-Cath will be removed based on the patient's request. No issues reported since that time.  3. Androgen depravation: This will continued under the care of Dr. [name]. The  plan is to continue indefinitely at this time.  4. Bone directed therapy: He is currently on calcium and vitamin D supplements. Risks and benefits of Delton See were reviewed today and he is ready to proceed today. Complications that includes hypocalcemia and osteonecrosis of the jaw or long-term complications from this treatment.  5. Follow-up: Will be in one month    Angeni Chaudhuri,MD 11/9/201710:13 AM

## 2016-02-02 NOTE — Progress Notes (Signed)
Er Dr. [name], patient has had dental clearance and is ready for Xgeva injection.

## 2016-02-03 LAB — PSA: Prostate Specific Ag, Serum: 3.9 ng/mL (ref 0.0–4.0)

## 2016-02-03 NOTE — Progress Notes (Signed)
Spoke with patient, gave results of last PSA 

## 2016-02-13 ENCOUNTER — Other Ambulatory Visit: Payer: Self-pay | Admitting: *Deleted

## 2016-02-13 DIAGNOSIS — C61 Malignant neoplasm of prostate: Secondary | ICD-10-CM

## 2016-02-13 MED ORDER — XTANDI 40 MG PO CAPS: 160.0000 mg | ORAL_CAPSULE | Freq: Every day | ORAL | 0 refills | Status: DC

## 2016-02-13 MED FILL — XTANDI 40 MG CAPSULE: 40 | 30 days supply | Qty: 120 | Fill #0

## 2016-03-07 ENCOUNTER — Other Ambulatory Visit (HOSPITAL_BASED_OUTPATIENT_CLINIC_OR_DEPARTMENT_OTHER): Payer: Medicare Other

## 2016-03-07 ENCOUNTER — Telehealth: Payer: Self-pay | Admitting: Oncology

## 2016-03-07 ENCOUNTER — Ambulatory Visit (HOSPITAL_BASED_OUTPATIENT_CLINIC_OR_DEPARTMENT_OTHER): Payer: Medicare Other

## 2016-03-07 ENCOUNTER — Ambulatory Visit (HOSPITAL_BASED_OUTPATIENT_CLINIC_OR_DEPARTMENT_OTHER): Payer: Medicare Other | Admitting: Oncology

## 2016-03-07 VITALS — BP 134/75 | HR 70 | Temp 97.8°F | Resp 18 | Ht 71.5 in | Wt 182.2 lb

## 2016-03-07 DIAGNOSIS — C7951 Secondary malignant neoplasm of bone: Secondary | ICD-10-CM

## 2016-03-07 DIAGNOSIS — C61 Malignant neoplasm of prostate: Secondary | ICD-10-CM | POA: Diagnosis not present

## 2016-03-07 LAB — COMPREHENSIVE METABOLIC PANEL
ALT: 10 U/L (ref 0–55)
AST: 10 U/L (ref 5–34)
Albumin: 3.9 g/dL (ref 3.5–5.0)
Alkaline Phosphatase: 100 U/L (ref 40–150)
Anion Gap: 9 mEq/L (ref 3–11)
BUN: 17.9 mg/dL (ref 7.0–26.0)
CHLORIDE: 109 meq/L (ref 98–109)
CO2: 23 meq/L (ref 22–29)
CREATININE: 0.8 mg/dL (ref 0.7–1.3)
Calcium: 9.7 mg/dL (ref 8.4–10.4)
EGFR: >90 mL/min/{1.73_m2} (ref >90)
Glucose: 115 mg/dl (ref 70–140)
POTASSIUM: 4.3 meq/L (ref 3.5–5.1)
SODIUM: 141 meq/L (ref 136–145)
Total Bilirubin: 0.32 mg/dL (ref 0.20–1.20)
Total Protein: 7.2 g/dL (ref 6.4–8.3)

## 2016-03-07 LAB — CBC WITH DIFFERENTIAL/PLATELET
BASO%: 0.3 % (ref 0.0–2.0)
BASOS ABS: 0 10*3/uL (ref 0.0–0.1)
EOS%: 1.9 % (ref 0.0–7.0)
Eosinophils Absolute: 0.1 10*3/uL (ref 0.0–0.5)
HCT: 44 % (ref 38.4–49.9)
HGB: 14.4 g/dL (ref 13.0–17.1)
LYMPH#: 1.9 10*3/uL (ref 0.9–3.3)
LYMPH%: 36.7 % (ref 14.0–49.0)
MCH: 28.7 pg (ref 27.2–33.4)
MCHC: 32.8 g/dL (ref 32.0–36.0)
MCV: 87.5 fL (ref 79.3–98.0)
MONO#: 0.4 10*3/uL (ref 0.1–0.9)
MONO%: 7 % (ref 0.0–14.0)
NEUT#: 2.8 10*3/uL (ref 1.5–6.5)
NEUT%: 54.1 % (ref 39.0–75.0)
Platelets: 219 10*3/uL (ref 140–400)
RBC: 5.03 10*6/uL (ref 4.20–5.82)
RDW: 13.6 % (ref 11.0–14.6)
WBC: 5.1 10*3/uL (ref 4.0–10.3)

## 2016-03-07 MED ORDER — DENOSUMAB 120 MG/1.7ML ~~LOC~~ SOLN
120.0000 mg | Freq: Once | SUBCUTANEOUS | Status: AC
  Administered 2016-03-07: 120 mg via SUBCUTANEOUS
  Filled 2016-03-07: qty 1.7

## 2016-03-07 NOTE — Telephone Encounter (Signed)
Appointments scheduled per 03/07/16 los. A copy of the appointment schedule and AVS report was given to the patient, per 03/07/16 los. ° °

## 2016-03-07 NOTE — Progress Notes (Signed)
Hematology and Oncology Follow Up Visit  [name] [id number] August 28, 1945 70 y.o. 03/07/2016 1:49 PM   Principle Diagnosis: 70 year old gentleman with prostate cancer diagnosed in October 2015. He had a Gleason score 4+5 = 9 and a PSA of 116. Abdominal imaging showed bulky retroperitoneal lymphadenopathy.   Prior Therapy:  Status post prostate biopsy on 01/19/2014. Systemic chemotherapy in the form of Taxotere 75 mg/m every 3 weeks with plans for total of 6 cycles. Cycle 1 given on 05/05/2014. He is status post 6 cycles of therapy concluded on 08/20/2014.  Current therapy:  Androgen deprivation under the care of Dr. [name]. He receives Lupron every 6 months. Xtandi 160 mg daily started on 07/25/2015. Xgeva monthly injections to start on 02/02/2016.  Interim History:  [name] presents today for a follow-up visit with his wife. Since the last visit, he continues to do very well. He remains active and exercises frequently. He reports his activity level has not changed and able to finish exercising at least 4 times a week. He continues to take [facility] without any new complications. He denied any nausea, abdominal pain and lower extremity edema. He has not reported any skeletal complaints of arthralgias or myalgias.   He continues to have calcium and vitamin D supplements without any bone pain or pathological fractures. He denied any dental complications. He continues to tolerate Xgeva without any complications.  He does not report any headaches, blurry vision or syncope. He does not report any seizures or any neurological symptoms. He does not report any fevers, chills, weight loss or decline his energy. He does not report any chest pain, palpitation, orthopnea or leg edema. He does not report any cough, hemoptysis or hematemesis. He does not report any nausea, vomiting, abdominal pain, constipation or diarrhea.  Remainder of his review of systems unremarkable.   Medications: I have reviewed  the patient's current medications.  Current Outpatient Prescriptions  Medication Sig Dispense Refill  . aspirin 81 MG tablet Take 81 mg by mouth every morning.     . calcium-vitamin D (OSCAL WITH D) 500-200 MG-UNIT tablet Take 1 tablet by mouth 2 (two) times daily. 60 tablet 3  . diphenhydramine-acetaminophen (TYLENOL PM) 25-500 MG TABS tablet Take 2 tablets by mouth at bedtime as needed.    . pseudoephedrine (SUDAFED) 30 MG tablet Take 30 mg by mouth as directed.    Gillermina Phy 40 MG capsule Take 4 capsules (160 mg total) by mouth daily. 120 capsule 0   No current facility-administered medications for this visit.      Allergies: No Known Allergies  Past Medical History, Surgical history, Social history, and Family History were reviewed and updated.   Physical Exam: Blood pressure 134/75, pulse 70, temperature 97.8 F (36.6 C), temperature source Oral, resp. rate 18, height 5' 11.5" (1.816 m), weight 182 lb 3.2 oz (82.6 kg). ECOG: 0 General appearance: Alert, awake gentleman appeared without distress. Head: Normocephalic, without obvious abnormality no oral ulcers or lesions. Neck: no adenopathy no thyroid masses. Lymph nodes: Cervical, supraclavicular, and axillary nodes normal. Heart:regular rate and rhythm, S1, S2 normal, no murmur, click, rub or gallop Lung:chest clear, no wheezing, rales, normal symmetric air entry no dullness to percussion. Abdomin: soft, non-tender, without masses or organomegaly no rebound or guarding. EXT:no erythema, induration, or nodules   Lab Results: Lab Results  Component Value Date   WBC 5.1 03/07/2016   HGB 14.4 03/07/2016   HCT 44.0 03/07/2016   MCV 87.5 03/07/2016   PLT 219  03/07/2016     Chemistry      Component Value Date/Time   NA 143 02/02/2016 0943   K 4.3 02/02/2016 0943   CL 108 10/04/2014 0749   CO2 26 02/02/2016 0943   BUN 14.4 02/02/2016 0943   CREATININE 0.8 02/02/2016 0943      Component Value Date/Time   CALCIUM 10.2  02/02/2016 0943   ALKPHOS 108 02/02/2016 0943   AST 12 02/02/2016 0943   ALT 11 02/02/2016 0943   BILITOT 0.44 02/02/2016 0943      Results for [name], [name] (MRN [id number]) as of 03/07/2016 13:34  Ref. Range 10/27/2015 11:32 12/27/2015 10:09 02/02/2016 09:43  PSA Latest Ref Range: 0.0 - 4.0 ng/mL 2.2 3.3 3.9      Impression and Plan:  70 year old gentleman with the following issues:  1. Advanced prostate cancer presenting with stage IV disease. His Gleason score is 4+5 = 9 with heavy volume disease involving 11 out of 12 cores of his biopsy obtained in October 2015. His PSA is 116 and his staging workup revealed lymphadenopathy in the periaortic and iliac chains. He started androgen depravation under the care of Dr. [name].  He is status post 6 cycles of chemotherapy and currently receiving androgen deprivation.    PSA increased up to 5.4 and staging workup on 07/19/2015 with bone scan is indicating bony metastasis including activity in the sixth rib and the inferior left ilium. 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  He started on Xtandi on 07/25/2015 and have tolerated it well.   His PSA is slowly rising at this time but remains under reasonable control. I recommended continuing Xtandi with the same dose and schedule and repeating imaging studies for staging purposes in January 2018. If his PSA continues to rise rapidly, or his scans show progression of disease we will use different salvage therapy. I will be in the form of Zytiga or systemic chemotherapy.  2. IV access: Port-A-Cath will be removed based on the patient's request. No issues reported since that time.  3. Androgen depravation: This will continued under the care of Dr. [name]. The plan is to continue indefinitely at this time.  4. Bone directed therapy: He is currently on calcium and vitamin D supplements. He received Xgeva in November 0000000 without complications. Plan  is to continue with every visit at this time.  5. Follow-up: Will be in one month    Karel Mowers,MD 12/13/20171:49 PM

## 2016-03-07 NOTE — Patient Instructions (Signed)
Denosumab injection  What is this medicine?  DENOSUMAB (den oh sue mab) slows bone breakdown. Prolia is used to treat osteoporosis in women after menopause and in men. Xgeva is used to prevent bone fractures and other bone problems caused by cancer bone metastases. Xgeva is also used to treat giant cell tumor of the bone.  This medicine may be used for other purposes; ask your health care provider or pharmacist if you have questions.  What should I tell my health care provider before I take this medicine?  They need to know if you have any of these conditions:  -dental disease  -eczema  -infection or history of infections  -kidney disease or on dialysis  -low blood calcium or vitamin D  -malabsorption syndrome  -scheduled to have surgery or tooth extraction  -taking medicine that contains denosumab  -thyroid or parathyroid disease  -an unusual reaction to denosumab, other medicines, foods, dyes, or preservatives  -pregnant or trying to get pregnant  -breast-feeding  How should I use this medicine?  This medicine is for injection under the skin. It is given by a health care professional in a hospital or clinic setting.  If you are getting Prolia, a special MedGuide will be given to you by the pharmacist with each prescription and refill. Be sure to read this information carefully each time.  For Prolia, talk to your pediatrician regarding the use of this medicine in children. Special care may be needed. For Xgeva, talk to your pediatrician regarding the use of this medicine in children. While this drug may be prescribed for children as young as 13 years for selected conditions, precautions do apply.  Overdosage: If you think you have taken too much of this medicine contact a poison control center or emergency room at once.  NOTE: This medicine is only for you. Do not share this medicine with others.  What if I miss a dose?  It is important not to miss your dose. Call your doctor or health care professional if you are  unable to keep an appointment.  What may interact with this medicine?  Do not take this medicine with any of the following medications:  -other medicines containing denosumab  This medicine may also interact with the following medications:  -medicines that suppress the immune system  -medicines that treat cancer  -steroid medicines like prednisone or cortisone  This list may not describe all possible interactions. Give your health care provider a list of all the medicines, herbs, non-prescription drugs, or dietary supplements you use. Also tell them if you smoke, drink alcohol, or use illegal drugs. Some items may interact with your medicine.  What should I watch for while using this medicine?  Visit your doctor or health care professional for regular checks on your progress. Your doctor or health care professional may order blood tests and other tests to see how you are doing.  Call your doctor or health care professional if you get a cold or other infection while receiving this medicine. Do not treat yourself. This medicine may decrease your body's ability to fight infection.  You should make sure you get enough calcium and vitamin D while you are taking this medicine, unless your doctor tells you not to. Discuss the foods you eat and the vitamins you take with your health care professional.  See your dentist regularly. Brush and floss your teeth as directed. Before you have any dental work done, tell your dentist you are receiving this medicine.  Do   not become pregnant while taking this medicine or for 5 months after stopping it. Women should inform their doctor if they wish to become pregnant or think they might be pregnant. There is a potential for serious side effects to an unborn child. Talk to your health care professional or pharmacist for more information.  What side effects may I notice from receiving this medicine?  Side effects that you should report to your doctor or health care professional as soon as  possible:  -allergic reactions like skin rash, itching or hives, swelling of the face, lips, or tongue  -breathing problems  -chest pain  -fast, irregular heartbeat  -feeling faint or lightheaded, falls  -fever, chills, or any other sign of infection  -muscle spasms, tightening, or twitches  -numbness or tingling  -skin blisters or bumps, or is dry, peels, or red  -slow healing or unexplained pain in the mouth or jaw  -unusual bleeding or bruising  Side effects that usually do not require medical attention (Report these to your doctor or health care professional if they continue or are bothersome.):  -muscle pain  -stomach upset, gas  This list may not describe all possible side effects. Call your doctor for medical advice about side effects. You may report side effects to FDA at 1-800-FDA-1088.  Where should I keep my medicine?  This medicine is only given in a clinic, doctor's office, or other health care setting and will not be stored at home.  NOTE: This sheet is a summary. It may not cover all possible information. If you have questions about this medicine, talk to your doctor, pharmacist, or health care provider.      2016, Elsevier/Gold Standard. (2011-09-10 12:37:47)

## 2016-03-08 ENCOUNTER — Telehealth: Payer: Self-pay | Admitting: *Deleted

## 2016-03-08 LAB — PSA: PROSTATE SPECIFIC AG, SERUM: 5.4 ng/mL — AB (ref 0.0–4.0)

## 2016-03-08 NOTE — Telephone Encounter (Signed)
-----   Message from Wyatt Portela, MD sent at 03/08/2016  8:16 AM EST ----- Please let him know his PSA is up. No change for now.

## 2016-03-08 NOTE — Telephone Encounter (Signed)
Spoke with patient, gave results of last PSA 

## 2016-03-16 ENCOUNTER — Other Ambulatory Visit: Payer: Self-pay | Admitting: *Deleted

## 2016-03-16 ENCOUNTER — Telehealth: Payer: Self-pay | Admitting: *Deleted

## 2016-03-16 DIAGNOSIS — C61 Malignant neoplasm of prostate: Secondary | ICD-10-CM

## 2016-03-16 MED ORDER — XTANDI 40 MG PO CAPS: 160.0000 mg | ORAL_CAPSULE | Freq: Every day | ORAL | 0 refills | Status: DC

## 2016-03-16 MED FILL — XTANDI 40 MG CAPSULE: 40 | 10 days supply | Qty: 40 | Fill #0

## 2016-03-16 NOTE — Telephone Encounter (Signed)
Patient called and stated,"I got an e-mail and my grant expired for [place]. What do I need to do?" Per Denyse Amass, [facility], oral chemo pharmacist, have the patient call [place] at [phone]. Patient verbalized understanding.

## 2016-03-22 ENCOUNTER — Telehealth: Payer: Self-pay | Admitting: Pharmacist

## 2016-03-22 NOTE — Telephone Encounter (Signed)
Oral Chemotherapy Pharmacist Encounter  I called patient to follow up on Xtandi copayment issues. Patient called [employer] yesterday (03/21/16) and completed patient application for enrollment into their program for [facility] free of charge from the manufacturer.  There are no foundation monies available for prostate cancer at the moment. Patient went ahead and received a partial fill of Xtandi from [facility] to carry him through the application process. I thanked patient for starting application process.  This encounter will continue to be updated until final determination.  Oral Oncology Clinic will continue to follow.   Johny Drilling, PharmD, BCPS, BCOP 03/22/2016  10:48 AM Oral Oncology Clinic [phone]

## 2016-03-28 ENCOUNTER — Other Ambulatory Visit: Payer: Self-pay | Admitting: Nurse Practitioner

## 2016-03-28 ENCOUNTER — Telehealth: Payer: Self-pay | Admitting: Pharmacist

## 2016-03-28 NOTE — Telephone Encounter (Signed)
Pt called [facility] inquiring the status of his Artist. I contacted [employer] (ph# [phone]) and the representative stated they received pts application.   They are requesting that pt first apply for Patient Access [employer], just opened for prostate pts.  This can be done online or by phone but online may be faster.  If there are no funds for pt w/ PANF, then pt may revert back to [employer] for assistance. We will begin the process for applying for PANF.  I returned call to pt w/ this information.  Kennith Center, Pharm.D., CPP 03/28/2016@4 :[facility]

## 2016-03-30 ENCOUNTER — Other Ambulatory Visit: Payer: Self-pay | Admitting: Nurse Practitioner

## 2016-04-02 NOTE — Telephone Encounter (Signed)
Oral Chemotherapy Pharmacist Encounter  Spoke with patient this morning about status of [employer] ([employer]) application and available copayment grant support. Patient has depleted there rest of his PAF funds and PAN[employer] has closed their grant.  Patient and I both called [employer] at [phone]. They will start to assess patient for enrollment into their program.  This encounter will continue to be updated until final determination.  Oral Oncology Clinic will continue to follow.   Johny Drilling, PharmD, BCPS, BCOP 04/02/2016  9:26 AM Oral Oncology Clinic [phone]

## 2016-04-03 NOTE — Telephone Encounter (Signed)
Oral Chemotherapy Pharmacist Encounter  Received notification from [facility]) that patient has been successfully enrolled into their program to receive Xtandi free-of-charge till 03/25/17. Case # [id number] [employer]SS phone [phone]   The [facility] ([facility]) will contact patient to schedule delivery of Xtandi fill and will ship it directly to the patient. It is noted on approval letter that prior to each subsequent shipment that the [facility] P[facility] will contact the patient to schedule the next shipment. [facility] P[facility] phone number: [phone]  A copy of the approval letter will be sent to scan into patient's chart.  Oral Oncology Clinic will sign-off at this time.  Please let us know if we can be of further assistance in the future.  Johny Drilling, PharmD, BCPS, BCOP 04/03/2016  10:15 AM Oral Oncology Clinic [phone]

## 2016-04-04 ENCOUNTER — Encounter: Payer: Self-pay | Admitting: *Deleted

## 2016-04-04 ENCOUNTER — Other Ambulatory Visit: Payer: Self-pay | Admitting: *Deleted

## 2016-04-04 DIAGNOSIS — C61 Malignant neoplasm of prostate: Secondary | ICD-10-CM

## 2016-04-04 MED ORDER — XTANDI 40 MG PO CAPS: 160.0000 mg | ORAL_CAPSULE | Freq: Every day | ORAL | 0 refills | Status: DC

## 2016-04-04 NOTE — Telephone Encounter (Signed)
Oral Chemotherapy Pharmacist Encounter  I spoke with [place] this morning for clarification about [facility] Patient Assistance program (PAP) pharmacy. [facility] Xtandi to patient's enrolled into XSS for free medication.  A new prescription for Gillermina Phy is requested to [facility] at [phone] for prescription processing. New Rx printed by Nyoka Cowden, RN, signed by MD, and [facility].  [facility] phone number to call for prescription questions: [phone] [employer] phone number to call for enrollment/eligibility questions: [phone]  I called patient to inform him [facility] would [facility] and they will call him to arrange for delivery of his Xtandi. Phone numbers above provided to patient.  All questions answered. Patient knows to contact the office with any questions or concerns.  Oral Oncology Clinic will sign-off.  Johny Drilling, PharmD, BCPS, BCOP 04/04/2016  11:17 AM Oral Oncology Clinic [phone]

## 2016-04-04 NOTE — Telephone Encounter (Signed)
Script for xtandi faxed to [facility] [phone]

## 2016-04-04 NOTE — Progress Notes (Signed)
Patient calling to say he is going on two days without his medication. A script was faxed to [facility] specialty pharmacy this am. Pryor Montes and they will expedite this request, they will call him to do an assessment before 7:00 pm tonight and hopefully have it shipped out to the patient's home tomorrow. Instructed patient to stay by his phone. Patient verbalized understanding.

## 2016-04-05 DIAGNOSIS — H40053 Ocular hypertension, bilateral: Secondary | ICD-10-CM | POA: Diagnosis not present

## 2016-04-10 ENCOUNTER — Other Ambulatory Visit (HOSPITAL_BASED_OUTPATIENT_CLINIC_OR_DEPARTMENT_OTHER): Payer: Medicare Other

## 2016-04-10 ENCOUNTER — Encounter (HOSPITAL_COMMUNITY)
Admission: RE | Admit: 2016-04-10 | Discharge: 2016-04-10 | Disposition: A | Discharge status: Home / Self Care | Payer: Medicare Other | Source: Ambulatory Visit | Attending: Oncology | Admitting: Oncology

## 2016-04-10 ENCOUNTER — Ambulatory Visit (HOSPITAL_COMMUNITY)
Admission: RE | Admit: 2016-04-10 | Discharge: 2016-04-10 | Disposition: A | Discharge status: Home / Self Care | Payer: Medicare Other | Source: Ambulatory Visit | Attending: Oncology | Admitting: Oncology

## 2016-04-10 DIAGNOSIS — R911 Solitary pulmonary nodule: Secondary | ICD-10-CM | POA: Insufficient documentation

## 2016-04-10 DIAGNOSIS — C61 Malignant neoplasm of prostate: Secondary | ICD-10-CM

## 2016-04-10 DIAGNOSIS — I2510 Atherosclerotic heart disease of native coronary artery without angina pectoris: Secondary | ICD-10-CM | POA: Diagnosis not present

## 2016-04-10 DIAGNOSIS — I700 Atherosclerosis of aorta: Secondary | ICD-10-CM | POA: Diagnosis not present

## 2016-04-10 DIAGNOSIS — C7951 Secondary malignant neoplasm of bone: Secondary | ICD-10-CM | POA: Diagnosis not present

## 2016-04-10 DIAGNOSIS — C419 Malignant neoplasm of bone and articular cartilage, unspecified: Secondary | ICD-10-CM | POA: Diagnosis not present

## 2016-04-10 LAB — CBC WITH DIFFERENTIAL/PLATELET
BASO%: 0.2 % (ref 0.0–2.0)
Basophils Absolute: 0 10*3/uL (ref 0.0–0.1)
EOS ABS: 0.1 10*3/uL (ref 0.0–0.5)
EOS%: 1 % (ref 0.0–7.0)
HCT: 41.5 % (ref 38.4–49.9)
HEMOGLOBIN: 14.2 g/dL (ref 13.0–17.1)
LYMPH%: 35.3 % (ref 14.0–49.0)
MCH: 29.2 pg (ref 27.2–33.4)
MCHC: 34.2 g/dL (ref 32.0–36.0)
MCV: 85.4 fL (ref 79.3–98.0)
MONO#: 0.4 10*3/uL (ref 0.1–0.9)
MONO%: 7.7 % (ref 0.0–14.0)
NEUT%: 55.8 % (ref 39.0–75.0)
NEUTROS ABS: 2.8 10*3/uL (ref 1.5–6.5)
Platelets: 175 10*3/uL (ref 140–400)
RBC: 4.86 10*6/uL (ref 4.20–5.82)
RDW: 13.6 % (ref 11.0–14.6)
WBC: 5 10*3/uL (ref 4.0–10.3)
lymph#: 1.8 10*3/uL (ref 0.9–3.3)

## 2016-04-10 LAB — COMPREHENSIVE METABOLIC PANEL
ALBUMIN: 4.1 g/dL (ref 3.5–5.0)
ALK PHOS: 58 U/L (ref 40–150)
ALT: 9 U/L (ref 0–55)
AST: 11 U/L (ref 5–34)
Anion Gap: 8 mEq/L (ref 3–11)
BILIRUBIN TOTAL: 0.56 mg/dL (ref 0.20–1.20)
BUN: 11.9 mg/dL (ref 7.0–26.0)
CO2: 25 mEq/L (ref 22–29)
Calcium: 9.6 mg/dL (ref 8.4–10.4)
Chloride: 111 mEq/L — ABNORMAL HIGH (ref 98–109)
Creatinine: 0.8 mg/dL (ref 0.7–1.3)
EGFR: >90 mL/min/{1.73_m2} (ref >90)
GLUCOSE: 92 mg/dL (ref 70–140)
Potassium: 4.2 mEq/L (ref 3.5–5.1)
SODIUM: 144 meq/L (ref 136–145)
TOTAL PROTEIN: 7 g/dL (ref 6.4–8.3)

## 2016-04-10 MED ORDER — IOPAMIDOL (ISOVUE-300) INJECTION 61%
INTRAVENOUS | Status: AC
Start: 2016-04-10 — End: 2016-04-10
  Filled 2016-04-10: qty 100

## 2016-04-10 MED ORDER — IOPAMIDOL (ISOVUE-300) INJECTION 61%
100.0000 mL | Freq: Once | INTRAVENOUS | Status: AC | PRN
  Administered 2016-04-10: 100 mL via INTRAVENOUS

## 2016-04-10 MED ORDER — TECHNETIUM TC 99M MEDRONATE IV KIT: 25.0000 | PACK | Freq: Once | INTRAVENOUS | Status: DC | PRN

## 2016-04-11 LAB — TESTOSTERONE

## 2016-04-11 LAB — PSA: PROSTATE SPECIFIC AG, SERUM: 6.9 ng/mL — AB (ref 0.0–4.0)

## 2016-04-12 ENCOUNTER — Telehealth: Payer: Self-pay | Admitting: Oncology

## 2016-04-12 ENCOUNTER — Ambulatory Visit (HOSPITAL_BASED_OUTPATIENT_CLINIC_OR_DEPARTMENT_OTHER): Payer: Medicare Other

## 2016-04-12 ENCOUNTER — Ambulatory Visit (HOSPITAL_BASED_OUTPATIENT_CLINIC_OR_DEPARTMENT_OTHER): Payer: Medicare Other | Admitting: Oncology

## 2016-04-12 VITALS — BP 174/74 | HR 83 | Temp 97.6°F | Resp 18 | Ht 71.5 in | Wt 179.1 lb

## 2016-04-12 DIAGNOSIS — C7951 Secondary malignant neoplasm of bone: Secondary | ICD-10-CM

## 2016-04-12 DIAGNOSIS — C61 Malignant neoplasm of prostate: Secondary | ICD-10-CM

## 2016-04-12 MED ORDER — DENOSUMAB 120 MG/1.7ML ~~LOC~~ SOLN
120.0000 mg | Freq: Once | SUBCUTANEOUS | Status: AC
  Administered 2016-04-12: 120 mg via SUBCUTANEOUS
  Filled 2016-04-12: qty 1.7

## 2016-04-12 NOTE — Telephone Encounter (Signed)
Appointments scheduled per 1/18 LOS. Patient given AVS report and calendars with future scheduled appointments. Injection appointment added per 1/18 scheduling message.

## 2016-04-12 NOTE — Progress Notes (Signed)
Hematology and Oncology Follow Up Visit  [name] [id number] 1946/01/24 71 y.o. 04/12/2016 2:00 PM   Principle Diagnosis: 71 year old gentleman with prostate cancer diagnosed in October 2015. He had a Gleason score 4+5 = 9 and a PSA of 116. Abdominal imaging showed bulky retroperitoneal lymphadenopathy.   Prior Therapy:  Status post prostate biopsy on 01/19/2014. Systemic chemotherapy in the form of Taxotere 75 mg/m every 3 weeks with plans for total of 6 cycles. Cycle 1 given on 05/05/2014. He is status post 6 cycles of therapy concluded on 08/20/2014.  Current therapy:  Androgen deprivation under the care of Dr. [name]. He receives Lupron every 6 months. Xtandi 160 mg daily started on 07/25/2015. Xgeva monthly injections to start on 02/02/2016.  Interim History:  [name] presents today for a follow-up visit with his wife. Since the last visit, he reports no major changes. He continues to have periodic pressure around his eyes attributed to sinusitis. He took Sudafed which helped with symptoms. He also developed abdominal pain and diarrhea for a short period of time that has resolved at this time. He is no longer reporting any nausea or vomiting. He denied any hematochezia or melena. He continues to have calcium and vitamin D supplements without any bone pain or pathological fractures. He denied any dental complications. He continues to tolerate Xgeva without any complications. He did not report any new complications related to [place]. He denied any lower extremity edema or excessive fatigue. He continues to attend to her activities of daily living without any decline.  He does not report any headaches, blurry vision or syncope. He does not report any seizures or any neurological symptoms. He does not report any fevers, chills, weight loss or decline his energy. He does not report any chest pain, palpitation, orthopnea or leg edema. He does not report any cough, hemoptysis or hematemesis.  He does not report any nausea, vomiting, abdominal pain, constipation or diarrhea.  Remainder of his review of systems unremarkable.   Medications: I have reviewed the patient's current medications.  Current Outpatient Prescriptions  Medication Sig Dispense Refill  . aspirin 81 MG tablet Take 81 mg by mouth every morning.     . calcium-vitamin D (OSCAL WITH D) 500-200 MG-UNIT tablet Take 1 tablet by mouth 2 (two) times daily. 60 tablet 3  . diphenhydramine-acetaminophen (TYLENOL PM) 25-500 MG TABS tablet Take 2 tablets by mouth at bedtime as needed.    . pseudoephedrine (SUDAFED) 30 MG tablet Take 30 mg by mouth as directed.    Gillermina Phy 40 MG capsule Take 4 capsules (160 mg total) by mouth daily. 120 capsule 0   No current facility-administered medications for this visit.    Facility-Administered Medications Ordered in Other Visits  Medication Dose Route Frequency Provider Last Rate Last Dose  . technetium medronate (TC-MDP) injection 25 millicurie  25 millicurie Intravenous Once PRN Gilford Silvius, MD         Allergies: No Known Allergies  Past Medical History, Surgical history, Social history, and Family History were reviewed and updated.   Physical Exam: Blood pressure (!) 174/74, pulse 83, temperature 97.6 F (36.4 C), temperature source Oral, resp. rate 18, height 5' 11.5" (1.816 m), weight 179 lb 1.6 oz (81.2 kg), SpO2 99 %. ECOG: 0 General appearance: Well-appearing gentleman without distress. Head: Normocephalic, without obvious abnormality no oral thrush noted. Neck: no adenopathy no thyroid masses. Lymph nodes: Cervical, supraclavicular, and axillary nodes normal. Heart:regular rate and rhythm, S1, S2 normal, no  murmur, click, rub or gallop Lung:chest clear, no wheezing, rales, normal symmetric air entry no dullness to percussion. Abdomin: soft, non-tender, without masses or organomegaly no shifting dullness or ascites. EXT:no erythema, induration, or nodules   Lab  Results: Lab Results  Component Value Date   WBC 5.0 04/10/2016   HGB 14.2 04/10/2016   HCT 41.5 04/10/2016   MCV 85.4 04/10/2016   PLT 175 04/10/2016     Chemistry      Component Value Date/Time   NA 144 04/10/2016 1216   K 4.2 04/10/2016 1216   CL 108 10/04/2014 0749   CO2 25 04/10/2016 1216   BUN 11.9 04/10/2016 1216   CREATININE 0.8 04/10/2016 1216      Component Value Date/Time   CALCIUM 9.6 04/10/2016 1216   ALKPHOS 58 04/10/2016 1216   AST 11 04/10/2016 1216   ALT 9 04/10/2016 1216   BILITOT 0.56 04/10/2016 1216      Results for [name], [name] (MRN [id number]) as of 04/12/2016 13:34  Ref. Range 02/02/2016 09:43 03/07/2016 13:24 04/10/2016 12:16  PSA Latest Ref Range: 0.0 - 4.0 ng/mL 3.9 5.4 (H) 6.9 (H)   EXAM: NUCLEAR MEDICINE WHOLE BODY BONE SCAN  TECHNIQUE: Whole body anterior and posterior images were obtained approximately 3 hours after intravenous injection of radiopharmaceutical.  RADIOPHARMACEUTICALS:  21.0 mCi Technetium-82m MDP IV  COMPARISON:  July 19, 2015 whole-body bone scan; CT chest, abdomen, and pelvis April 10, 2016 ; CT chest, abdomen, and pelvis July 19, 2015  FINDINGS: There is increased radiotracer uptake in the right posterior sixth rib, increased from prior study. Current CT shows a sclerotic mildly expansile lesion in the posterior right sixth rib, more pronounced than on prior study.  There is focal increased radiotracer uptake in the rightward aspect of the T11 vertebral body; CT shows a sclerotic lesion in this area corresponding to a metastatic focus. There is increased radiotracer uptake in the mid lumbar region toward the left, likely of arthropathic etiology. Note that there is scoliosis, convex to the right, in the upper lumbar region. There is rather subtle focal increased uptake in the inferomedial left iliac bone near the sacroiliac junction. This area was slightly more apparent on prior study. CT does show a  sclerotic lesion in this area, and this focus is felt to represent metastatic disease. Note that CT shows a sclerotic focus in the S1 vertebral body toward the left. This lesion is equivocal on the current bone scan.  Focal areas of increased uptake in each shoulder and knee region is felt to be of arthropathic etiology. Increased uptake in the mid face is likely due to paranasal sinus disease. Kidneys are noted in the flank positions bilaterally.  IMPRESSION: Evidence of bony metastatic disease. There has been an increase in radiotracer uptake in the posterior right sixth rib with corresponding sclerotic focus seen increased in size and expansion on current CT. Metastatic foci are noted in the rightward aspect of the T11 vertebral body, in the S1 vertebral body toward the left, and in the inferior aspect of the left iliac bone near the sacroiliac junction. Increased uptake in the mid face is probably due to paranasal sinus disease.  IMPRESSION: 1. Interval development of a new sclerotic lesion in the posterior right sixth rib compatible with metastatic disease. Remaining visualized bony sclerotic lesions are stable. 2. Tiny posterior right lower lobe pulmonary nodule is probably related to the adjacent subpleural atelectasis. Attention on follow-up recommended. Otherwise stable CT evaluation of the lungs  with no change in the previously described 6 mm left lower lobe pulmonary nodule. 3. Coronary artery atherosclerosis. Abdominal Aortic Atherosclerois (ICD10-170.0)  Impression and Plan:  71 year old gentleman with the following issues:  1. Advanced prostate cancer presenting with stage IV disease. His Gleason score is 4+5 = 9 with heavy volume disease involving 11 out of 12 cores of his biopsy obtained in October 2015. His PSA is 116 and his staging workup revealed lymphadenopathy in the periaortic and iliac chains. He started androgen depravation under the care of Dr.  [name].  He is status post 6 cycles of chemotherapy and currently receiving androgen deprivation.    PSA increased up to 5.4 and staging workup on 07/19/2015 with bone scan is indicating bony metastasis including activity in the sixth rib and the inferior left ilium. 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  He started on Xtandi on 07/25/2015 and have tolerated it well.   His CT scan and bone scan obtained on 04/10/2016 or personally reviewed and discussed with the patient. His PSA is also reviewed which showed mild increase up to 6.9. These findings do indicate slight progression of disease although he is completely asymptomatic at this time. Options of therapy were reviewed including continuing Xtandi versus switching to different salvage therapy. These options would include Zytiga versus systemic chemotherapy. Trudi Ida will be also an option given his bone disease.  After discussion today, without continuing Xtandi and monitor him closely. Given his excellent quality of life and performance status he would like to continue on this current medication given the lack of impact on his quality of life  2. IV access: Port-A-Cath will be removed based on the patient's request. No issues reported since that time.  3. Androgen depravation: This will continued under the care of Dr. [name]. He expressed interest in receiving Lupron at the [place]. I will check his testosterone level with the next visit.  4. Bone directed therapy: He is currently on calcium and vitamin D supplements. He received Xgeva in December 0000000 without complications. The plan is to continue without every visit.  5. Follow-up: Will be in one month    [place],MD 1/18/20182:00 PM

## 2016-04-12 NOTE — Patient Instructions (Signed)
[facility] Discharge Instructions for Patients Receiving Chemotherapy  Today you received the following chemotherapy agents taxotere To help prevent nausea and vomiting after your treatment, we encourage you to take your nausea medication as prescribed.   If you develop nausea and vomiting that is not controlled by your nausea medication, call the clinic.   BELOW ARE SYMPTOMS THAT SHOULD BE REPORTED IMMEDIATELY:  *FEVER GREATER THAN 100.5 F  *CHILLS WITH OR WITHOUT FEVER  NAUSEA AND VOMITING THAT IS NOT CONTROLLED WITH YOUR NAUSEA MEDICATION  *UNUSUAL SHORTNESS OF BREATH  *UNUSUAL BRUISING OR BLEEDING  TENDERNESS IN MOUTH AND THROAT WITH OR WITHOUT PRESENCE OF ULCERS  *URINARY PROBLEMS  *BOWEL PROBLEMS  UNUSUAL RASH Items with * indicate a potential emergency and should be followed up as soon as possible.  Feel free to call the clinic you have any questions or concerns. The clinic phone number is [phone].  Please show the CHEMO ALERT CARD at check-in to the Emergency Department and triage nurse.   

## 2016-05-01 ENCOUNTER — Telehealth: Payer: Self-pay

## 2016-05-01 ENCOUNTER — Other Ambulatory Visit: Payer: Self-pay | Admitting: *Deleted

## 2016-05-01 ENCOUNTER — Encounter: Payer: Self-pay | Admitting: *Deleted

## 2016-05-01 DIAGNOSIS — C61 Malignant neoplasm of prostate: Secondary | ICD-10-CM

## 2016-05-01 MED ORDER — XTANDI 40 MG PO CAPS: 160.0000 mg | ORAL_CAPSULE | Freq: Every day | ORAL | 0 refills | Status: DC

## 2016-05-01 NOTE — Progress Notes (Signed)
[facility]I [facility] [phone]

## 2016-05-01 NOTE — Telephone Encounter (Signed)
Faxed refill script for xtandi, to [facility]

## 2016-05-01 NOTE — Telephone Encounter (Signed)
[facility] that [facility]. He runs out on Friday.

## 2016-05-18 ENCOUNTER — Other Ambulatory Visit (HOSPITAL_BASED_OUTPATIENT_CLINIC_OR_DEPARTMENT_OTHER): Payer: Medicare Other

## 2016-05-18 ENCOUNTER — Telehealth: Payer: Self-pay | Admitting: Oncology

## 2016-05-18 ENCOUNTER — Ambulatory Visit (HOSPITAL_BASED_OUTPATIENT_CLINIC_OR_DEPARTMENT_OTHER): Payer: Medicare Other | Admitting: Oncology

## 2016-05-18 ENCOUNTER — Ambulatory Visit (HOSPITAL_BASED_OUTPATIENT_CLINIC_OR_DEPARTMENT_OTHER): Payer: Medicare Other

## 2016-05-18 VITALS — BP 166/89 | HR 71 | Temp 97.7°F | Resp 16 | Wt 177.4 lb

## 2016-05-18 DIAGNOSIS — C61 Malignant neoplasm of prostate: Secondary | ICD-10-CM

## 2016-05-18 DIAGNOSIS — C7951 Secondary malignant neoplasm of bone: Secondary | ICD-10-CM

## 2016-05-18 LAB — CBC WITH DIFFERENTIAL/PLATELET
BASO%: 0.3 % (ref 0.0–2.0)
Basophils Absolute: 0 10*3/uL (ref 0.0–0.1)
EOS%: 1.7 % (ref 0.0–7.0)
Eosinophils Absolute: 0.1 10*3/uL (ref 0.0–0.5)
HCT: 41.5 % (ref 38.4–49.9)
HEMOGLOBIN: 14.1 g/dL (ref 13.0–17.1)
LYMPH#: 1.6 10*3/uL (ref 0.9–3.3)
LYMPH%: 36 % (ref 14.0–49.0)
MCH: 29.6 pg (ref 27.2–33.4)
MCHC: 34 g/dL (ref 32.0–36.0)
MCV: 87.2 fL (ref 79.3–98.0)
MONO#: 0.4 10*3/uL (ref 0.1–0.9)
MONO%: 8.4 % (ref 0.0–14.0)
NEUT#: 2.4 10*3/uL (ref 1.5–6.5)
NEUT%: 53.6 % (ref 39.0–75.0)
Platelets: 236 10*3/uL (ref 140–400)
RBC: 4.76 10*6/uL (ref 4.20–5.82)
RDW: 13.8 % (ref 11.0–14.6)
WBC: 4.5 10*3/uL (ref 4.0–10.3)

## 2016-05-18 LAB — COMPREHENSIVE METABOLIC PANEL
ALBUMIN: 4.3 g/dL (ref 3.5–5.0)
ALT: 10 U/L (ref 0–55)
AST: 9 U/L (ref 5–34)
Alkaline Phosphatase: 68 U/L (ref 40–150)
Anion Gap: 9 mEq/L (ref 3–11)
BUN: 20 mg/dL (ref 7.0–26.0)
CALCIUM: 10.1 mg/dL (ref 8.4–10.4)
CHLORIDE: 109 meq/L (ref 98–109)
CO2: 25 mEq/L (ref 22–29)
CREATININE: 0.9 mg/dL (ref 0.7–1.3)
EGFR: 89 mL/min/{1.73_m2} — ABNORMAL LOW (ref >90)
GLUCOSE: 88 mg/dL (ref 70–140)
POTASSIUM: 4.3 meq/L (ref 3.5–5.1)
SODIUM: 142 meq/L (ref 136–145)
Total Bilirubin: 0.34 mg/dL (ref 0.20–1.20)
Total Protein: 7.5 g/dL (ref 6.4–8.3)

## 2016-05-18 MED ORDER — DENOSUMAB 120 MG/1.7ML ~~LOC~~ SOLN
120.0000 mg | Freq: Once | SUBCUTANEOUS | Status: AC
  Administered 2016-05-18: 120 mg via SUBCUTANEOUS
  Filled 2016-05-18: qty 1.7

## 2016-05-18 NOTE — Patient Instructions (Signed)
Denosumab injection What is this medicine? DENOSUMAB (den oh sue mab) slows bone breakdown. Prolia is used to treat osteoporosis in women after menopause and in men. Xgeva is used to prevent bone fractures and other bone problems caused by cancer bone metastases. Xgeva is also used to treat giant cell tumor of the bone. COMMON BRAND NAME(S): Prolia, XGEVA What should I tell my health care provider before I take this medicine? They need to know if you have any of these conditions: -dental disease -eczema -infection or history of infections -kidney disease or on dialysis -low blood calcium or vitamin D -malabsorption syndrome -scheduled to have surgery or tooth extraction -taking medicine that contains denosumab -thyroid or parathyroid disease -an unusual reaction to denosumab, other medicines, foods, dyes, or preservatives -pregnant or trying to get pregnant -breast-feeding How should I use this medicine? This medicine is for injection under the skin. It is given by a health care professional in a hospital or clinic setting. If you are getting Prolia, a special MedGuide will be given to you by the pharmacist with each prescription and refill. Be sure to read this information carefully each time. For Prolia, talk to your pediatrician regarding the use of this medicine in children. Special care may be needed. For Xgeva, talk to your pediatrician regarding the use of this medicine in children. While this drug may be prescribed for children as young as 13 years for selected conditions, precautions do apply. What if I miss a dose? It is important not to miss your dose. Call your doctor or health care professional if you are unable to keep an appointment. What may interact with this medicine? Do not take this medicine with any of the following medications: -other medicines containing denosumab This medicine may also interact with the following medications: -medicines that suppress the immune  system -medicines that treat cancer -steroid medicines like prednisone or cortisone What should I watch for while using this medicine? Visit your doctor or health care professional for regular checks on your progress. Your doctor or health care professional may order blood tests and other tests to see how you are doing. Call your doctor or health care professional if you get a cold or other infection while receiving this medicine. Do not treat yourself. This medicine may decrease your body's ability to fight infection. You should make sure you get enough calcium and vitamin D while you are taking this medicine, unless your doctor tells you not to. Discuss the foods you eat and the vitamins you take with your health care professional. See your dentist regularly. Brush and floss your teeth as directed. Before you have any dental work done, tell your dentist you are receiving this medicine. Do not become pregnant while taking this medicine or for 5 months after stopping it. Women should inform their doctor if they wish to become pregnant or think they might be pregnant. There is a potential for serious side effects to an unborn child. Talk to your health care professional or pharmacist for more information. What side effects may I notice from receiving this medicine? Side effects that you should report to your doctor or health care professional as soon as possible: -allergic reactions like skin rash, itching or hives, swelling of the face, lips, or tongue -breathing problems -chest pain -fast, irregular heartbeat -feeling faint or lightheaded, falls -fever, chills, or any other sign of infection -muscle spasms, tightening, or twitches -numbness or tingling -skin blisters or bumps, or is dry, peels, or red -slow   healing or unexplained pain in the mouth or jaw -unusual bleeding or bruising Side effects that usually do not require medical attention (report to your doctor or health care professional  if they continue or are bothersome): -muscle pain -stomach upset, gas Where should I keep my medicine? This medicine is only given in a clinic, doctor's office, or other health care setting and will not be stored at home.  2017 Elsevier/Gold Standard (2015-04-14 10:06:55)  

## 2016-05-18 NOTE — Progress Notes (Signed)
Hematology and Oncology Follow Up Visit  [name] [id number] May 24, 1945 71 y.o. 05/18/2016 12:30 PM   Principle Diagnosis: 71 year old gentleman with prostate cancer diagnosed in October 2015. He had a Gleason score 4+5 = 9 and a PSA of 116. Abdominal imaging showed bulky retroperitoneal lymphadenopathy.   Prior Therapy:  Status post prostate biopsy on 01/19/2014. Systemic chemotherapy in the form of Taxotere 75 mg/m every 3 weeks with plans for total of 6 cycles. Cycle 1 given on 05/05/2014. He is status post 6 cycles of therapy concluded on 08/20/2014.  Current therapy:  Androgen deprivation under the care of Dr. [name]. He receives Lupron every 6 months. Xtandi 160 mg daily started on 07/25/2015. Xgeva monthly injections to start on 02/02/2016.  Interim History:  [name] presents today for a follow-up visit with his wife. Since the last visit, he reports recent complaints. His complains predominantly related to sinus issues including congestion. Despite these complaints, he remains reasonably active and continues to work full time. He continues to exercise regularly. He did not report any new complications related to [place]. He denied any lower extremity edema or excessive fatigue. He has no difficulty obtaining this medication are taken on a regular basis.   He continues to have calcium and vitamin D supplements without any bone pain or pathological fractures. He denied any dental complications. He continues to tolerate Xgeva without any complications.  He does not report any headaches, blurry vision or syncope. He does not report any seizures or any neurological symptoms. He does not report any fevers, chills, weight loss or decline his energy. He does not report any chest pain, palpitation, orthopnea or leg edema. He does not report any cough, hemoptysis or hematemesis. He does not report any nausea, vomiting, abdominal pain, constipation or diarrhea.  Remainder of his review of  systems unremarkable.   Medications: I have reviewed the patient's current medications.  Current Outpatient Prescriptions  Medication Sig Dispense Refill  . aspirin 81 MG tablet Take 81 mg by mouth every morning.     . calcium-vitamin D (OSCAL WITH D) 500-200 MG-UNIT tablet Take 1 tablet by mouth 2 (two) times daily. 60 tablet 3  . diphenhydramine-acetaminophen (TYLENOL PM) 25-500 MG TABS tablet Take 2 tablets by mouth at bedtime as needed.    . pseudoephedrine (SUDAFED) 30 MG tablet Take 30 mg by mouth as directed.    [name] 40 MG capsule Take 4 capsules (160 mg total) by mouth daily. 120 capsule 0   No current facility-administered medications for this visit.      Allergies: No Known Allergies  Past Medical History, Surgical history, Social history, and Family History were reviewed and updated.   Physical Exam: Blood pressure (!) 166/89, pulse 71, temperature 97.7 F (36.5 C), resp. rate 16, weight 177 lb 7 oz (80.5 kg), SpO2 100 %. ECOG: 0 General appearance: Well-appearing gentleman without distress. Head: Normocephalic, without obvious abnormality no oral thrush noted. Neck: no adenopathy no thyroid masses. Lymph nodes: Cervical, supraclavicular, and axillary nodes normal. Heart:regular rate and rhythm, S1, S2 normal, no murmur, click, rub or gallop Lung:chest clear, no wheezing, rales, normal symmetric air entry no dullness to percussion. Abdomin: soft, non-tender, without masses or organomegaly no shifting dullness or ascites. EXT:no erythema, induration, or nodules   Lab Results: Lab Results  Component Value Date   WBC 4.5 05/18/2016   HGB 14.1 05/18/2016   HCT 41.5 05/18/2016   MCV 87.2 05/18/2016   PLT 236 05/18/2016  Chemistry      Component Value Date/Time   NA 144 04/10/2016 1216   K 4.2 04/10/2016 1216   CL 108 10/04/2014 0749   CO2 25 04/10/2016 1216   BUN 11.9 04/10/2016 1216   CREATININE 0.8 04/10/2016 1216      Component Value Date/Time    CALCIUM 9.6 04/10/2016 1216   ALKPHOS 58 04/10/2016 1216   AST 11 04/10/2016 1216   ALT 9 04/10/2016 1216   BILITOT 0.56 04/10/2016 1216      Impression and Plan:  71 year old gentleman with the following issues:  1. Advanced prostate cancer presenting with stage IV disease. His Gleason score is 4+5 = 9 with heavy volume disease involving 11 out of 12 cores of his biopsy obtained in October 2015. His PSA is 116 and his staging workup revealed lymphadenopathy in the periaortic and iliac chains. He started androgen depravation under the care of Dr. [name].  He is status post 6 cycles of chemotherapy and currently receiving androgen deprivation.    PSA increased up to 5.4 and staging workup on 07/19/2015 with bone scan is indicating bony metastasis including activity in the sixth rib and the inferior left ilium. 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  He started on Xtandi on 07/25/2015 and have tolerated it well.    CT scan and bone scan obtained on 04/10/2016 showed mild progression of disease that is asymptomatic. His last PSA has slightly increased as well to 6.9.  The plan is to continue on Xtandi for the time being and you Zytiga if he develops symptomatic progression or rapid rise in his PSA. Rationale for using this medication was discussed today as well as complications associated with treatment. Written information was given to the patient today in case we start this medication the near future. He also understands that Zytiga needs to be taken with prednisone at 5 mg daily.   2. IV access: Port-A-Cath will be removed based on the patient's request. No issues reported since that time.  3. Androgen depravation: This will continued under the care of Dr. [name]. He expressed interest in receiving Lupron at the [facility]. His PSA in January 2018 continues to be fairly low. We'll repeat Lupron if his PSA starts to rise.  4. Bone  directed therapy: He is currently on calcium and vitamin D supplements. He received Xgeva in December 0000000 without complications. The plan is to continue without every visit.  5. Follow-up: Will be in one month    Korayma Hagwood,MD 2/23/201812:30 PM

## 2016-05-18 NOTE — Telephone Encounter (Signed)
Gave patient AVS and calendar for April appt.

## 2016-05-19 LAB — TESTOSTERONE: Testosterone, Serum: <3 ng/dL — ABNORMAL LOW (ref 264–916)

## 2016-05-19 LAB — PSA: Prostate Specific Ag, Serum: 10.8 ng/mL — ABNORMAL HIGH (ref 0.0–4.0)

## 2016-05-21 ENCOUNTER — Other Ambulatory Visit: Payer: Self-pay | Admitting: Oncology

## 2016-05-21 ENCOUNTER — Telehealth: Payer: Self-pay | Admitting: *Deleted

## 2016-05-21 DIAGNOSIS — C61 Malignant neoplasm of prostate: Secondary | ICD-10-CM

## 2016-05-21 NOTE — Telephone Encounter (Signed)
-----   Message from Wyatt Portela, MD sent at 05/21/2016 11:25 AM EST ----- Please let him know his PSA is slightly up. No change for now.

## 2016-05-21 NOTE — Telephone Encounter (Signed)
As noted below by Dr. Shadad, I informed patient of his PSA level. Patient verbalized understanding. 

## 2016-06-27 ENCOUNTER — Ambulatory Visit (HOSPITAL_BASED_OUTPATIENT_CLINIC_OR_DEPARTMENT_OTHER): Payer: Medicare Other

## 2016-06-27 ENCOUNTER — Ambulatory Visit (HOSPITAL_BASED_OUTPATIENT_CLINIC_OR_DEPARTMENT_OTHER): Payer: Medicare Other | Admitting: Oncology

## 2016-06-27 ENCOUNTER — Telehealth: Payer: Self-pay | Admitting: Oncology

## 2016-06-27 ENCOUNTER — Other Ambulatory Visit (HOSPITAL_BASED_OUTPATIENT_CLINIC_OR_DEPARTMENT_OTHER): Payer: Medicare Other

## 2016-06-27 VITALS — BP 183/82 | HR 66 | Temp 98.5°F | Resp 18 | Ht 71.0 in | Wt 174.1 lb

## 2016-06-27 DIAGNOSIS — C7951 Secondary malignant neoplasm of bone: Secondary | ICD-10-CM

## 2016-06-27 DIAGNOSIS — R110 Nausea: Secondary | ICD-10-CM

## 2016-06-27 DIAGNOSIS — C61 Malignant neoplasm of prostate: Secondary | ICD-10-CM

## 2016-06-27 LAB — CBC WITH DIFFERENTIAL/PLATELET
BASO%: 0.3 % (ref 0.0–2.0)
Basophils Absolute: 0 10*3/uL (ref 0.0–0.1)
EOS ABS: 0.1 10*3/uL (ref 0.0–0.5)
EOS%: 2 % (ref 0.0–7.0)
HCT: 38.6 % (ref 38.4–49.9)
HGB: 13.4 g/dL (ref 13.0–17.1)
LYMPH%: 31.8 % (ref 14.0–49.0)
MCH: 29.7 pg (ref 27.2–33.4)
MCHC: 34.6 g/dL (ref 32.0–36.0)
MCV: 85.9 fL (ref 79.3–98.0)
MONO#: 0.4 10*3/uL (ref 0.1–0.9)
MONO%: 9.5 % (ref 0.0–14.0)
NEUT%: 56.4 % (ref 39.0–75.0)
NEUTROS ABS: 2.4 10*3/uL (ref 1.5–6.5)
Platelets: 245 10*3/uL (ref 140–400)
RBC: 4.5 10*6/uL (ref 4.20–5.82)
RDW: 13.5 % (ref 11.0–14.6)
WBC: 4.3 10*3/uL (ref 4.0–10.3)
lymph#: 1.4 10*3/uL (ref 0.9–3.3)

## 2016-06-27 LAB — COMPREHENSIVE METABOLIC PANEL
ALT: 7 U/L (ref 0–55)
AST: 10 U/L (ref 5–34)
Albumin: 4.1 g/dL (ref 3.5–5.0)
Alkaline Phosphatase: 68 U/L (ref 40–150)
Anion Gap: 8 mEq/L (ref 3–11)
BUN: 19.4 mg/dL (ref 7.0–26.0)
CO2: 24 meq/L (ref 22–29)
Calcium: 9.9 mg/dL (ref 8.4–10.4)
Chloride: 107 mEq/L (ref 98–109)
Creatinine: 1.1 mg/dL (ref 0.7–1.3)
EGFR: 69 mL/min/{1.73_m2} — AB (ref >90)
GLUCOSE: 98 mg/dL (ref 70–140)
POTASSIUM: 4.5 meq/L (ref 3.5–5.1)
SODIUM: 140 meq/L (ref 136–145)
TOTAL PROTEIN: 7.1 g/dL (ref 6.4–8.3)
Total Bilirubin: 0.38 mg/dL (ref 0.20–1.20)

## 2016-06-27 MED ORDER — ONDANSETRON HCL 4 MG PO TABS: 4.0000 mg | ORAL_TABLET | Freq: Three times a day (TID) | ORAL | 0 refills | Status: DC | PRN

## 2016-06-27 MED ORDER — DENOSUMAB 120 MG/1.7ML ~~LOC~~ SOLN
120.0000 mg | Freq: Once | SUBCUTANEOUS | Status: AC
  Administered 2016-06-27: 120 mg via SUBCUTANEOUS
  Filled 2016-06-27: qty 1.7

## 2016-06-27 NOTE — Telephone Encounter (Signed)
Appointments scheduled per 4.4.18 LOS. Patient given AVS report and calendars with future scheduled appointments. °

## 2016-06-27 NOTE — Progress Notes (Addendum)
Hematology and Oncology Follow Up Visit  [name] [id number] May 20, 1945 71 y.o. 06/27/2016 12:29 PM   Principle Diagnosis: 71 year old gentleman with prostate cancer diagnosed in October 2015. He had a Gleason score 4+5 = 9 and a PSA of 116. Abdominal imaging showed bulky retroperitoneal lymphadenopathy.   Prior Therapy:  Status post prostate biopsy on 01/19/2014. Systemic chemotherapy in the form of Taxotere 75 mg/m every 3 weeks with plans for total of 6 cycles. Cycle 1 given on 05/05/2014. He is status post 6 cycles of therapy concluded on 08/20/2014.  Current therapy:  Androgen deprivation under the care of Dr. [name]. He receives Lupron every 6 months. Xtandi 160 mg daily started on 07/25/2015. Xgeva monthly injections to start on 02/02/2016.  Interim History:  [name] presents today for a follow-up visit with his wife. Since the last visit, he reports some minor changes in his health. He is reporting more fatigue and occasional tiredness. He also noted some mild nausea and decrease in his appetite. He lost 3 pounds since the last visit. He continues to work close to 40 hours a week but not exercising regularly like he used to in the past. He continues to take [place] without any major problems. He denied any abdominal pain or diarrhea.  He continues to have calcium and vitamin D supplements without any any complications related to [facility] either.  He does not report any headaches, blurry vision or syncope. He does not report any seizures or any neurological symptoms. He does not report any fevers, chills, weight loss or decline his energy. He does not report any chest pain, palpitation, orthopnea or leg edema. He does not report any cough, hemoptysis or hematemesis. He does not report any nausea, vomiting, abdominal pain, constipation or diarrhea.  Remainder of his review of systems unremarkable.   Medications: I have reviewed the patient's current medications.  Current Outpatient  Prescriptions  Medication Sig Dispense Refill  . aspirin 81 MG tablet Take 81 mg by mouth every morning.     . calcium-vitamin D (OSCAL WITH D) 500-200 MG-UNIT tablet Take 1 tablet by mouth 2 (two) times daily. 60 tablet 3  . diphenhydramine-acetaminophen (TYLENOL PM) 25-500 MG TABS tablet Take 2 tablets by mouth at bedtime as needed.    . pseudoephedrine (SUDAFED) 30 MG tablet Take 30 mg by mouth as directed.    Gillermina Phy 40 MG capsule TAKE 4 CAPSULES (160 MG TOTAL) BY MOUTH ONCE DAILY AT THE SAME TIME. MAY TAKE WITH OR WITHOUT FOOD. SWALLOW WHOLE. 120 capsule 0   No current facility-administered medications for this visit.      Allergies: No Known Allergies  Past Medical History, Surgical history, Social history, and Family History were reviewed and updated.   Physical Exam: Blood pressure (!) 183/82, pulse 66, temperature 98.5 F (36.9 C), temperature source Oral, resp. rate 18, height 5\' 11"  (1.803 m), weight 174 lb 1.6 oz (79 kg), SpO2 100 %. ECOG: 0 General appearance: Alert, awake gentleman without distress. Head: Normocephalic, without obvious abnormality no oral ulcers or lesions. Neck: no adenopathy no thyroid masses. Lymph nodes: Cervical, supraclavicular, and axillary nodes normal. Heart:regular rate and rhythm, S1, S2 normal, no murmur, click, rub or gallop Lung:chest clear, no wheezing, rales, normal symmetric air entry no dullness to percussion. Abdomin: soft, non-tender, without masses or organomegaly no rebound or guarding. EXT:no erythema, induration, or nodules   Lab Results: Lab Results  Component Value Date   WBC 4.3 06/27/2016   HGB 13.4 06/27/2016  HCT 38.6 06/27/2016   MCV 85.9 06/27/2016   PLT 245 06/27/2016     Chemistry      Component Value Date/Time   NA 142 05/18/2016 1153   K 4.3 05/18/2016 1153   CL 108 10/04/2014 0749   CO2 25 05/18/2016 1153   BUN 20.0 05/18/2016 1153   CREATININE 0.9 05/18/2016 1153      Component Value Date/Time    CALCIUM 10.1 05/18/2016 1153   ALKPHOS 68 05/18/2016 1153   AST 9 05/18/2016 1153   ALT 10 05/18/2016 1153   BILITOT 0.34 05/18/2016 1153      Results for [name] (MRN [id number]) as of 06/27/2016 12:05  Ref. Range 03/07/2016 13:24 04/10/2016 12:16 05/18/2016 11:53  PSA Latest Ref Range: 0.0 - 4.0 ng/mL 5.4 (H) 6.9 (H) 10.8 (H)    Impression and Plan:  71 year old gentleman with the following issues:  1. Advanced prostate cancer presenting with stage IV disease. His Gleason score is 4+5 = 9 with heavy volume disease involving 11 out of 12 cores of his biopsy obtained in October 2015. His PSA is 116 and his staging workup revealed lymphadenopathy in the periaortic and iliac chains. He started androgen depravation under the care of Dr. [name].  He is status post 6 cycles of chemotherapy and currently receiving androgen deprivation.    PSA increased up to 5.4 and staging workup on 07/19/2015 with bone scan is indicating bony metastasis including activity in the sixth rib and the inferior left ilium. 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  He started on Xtandi on 07/25/2015 and have tolerated it well.   CT scan and bone scan obtained on 04/10/2016 showed some mild progression of his disease predominantly in the bone. No visceral metastasis noted.  His PSA in February 2018 was 10.8 and his PSA from today is currently pending. If his PSA continues to rise, the plan is to switch him to [place]. Risks and benefits associated with this medication was discussed again today. Complications include fatigue, arthralgias, lower extremity edema, hypokalemia and hypertension among others were discussed today. He is agreeable to proceed without if his PSA continues to rise today.   2. Mild nausea and anorexia: Unclear etiology. CT scan in January 2018 did not show any evidence of visceral metastasis. I will prescribe him Zofran to ensure adequate  symptom management.  3. Androgen depravation: This will continued under the care of Dr. [name]. He expressed interest in receiving Lupron at the [facility]. His *Strine February 2018 continues to be castrate.  4. Bone directed therapy: He is currently on calcium and vitamin D supplements. He received Xgeva in December 7619 without complications. The plan is to continue without every visit.  5. Follow-up: Will be in one month    Josue Kass,MD 4/4/201812:29 PM    His PSA result was discussed with him over the phone today and was up to 14. The plan is to proceed with Zytiga at 1000 mg daily with prednisone at 5 mg daily instead of Xtandi. He is to continue [place] until he receives [place].  [facility] MD 06/28/16

## 2016-06-27 NOTE — Patient Instructions (Signed)
Denosumab injection What is this medicine? DENOSUMAB (den oh sue mab) slows bone breakdown. Prolia is used to treat osteoporosis in women after menopause and in men. Xgeva is used to prevent bone fractures and other bone problems caused by cancer bone metastases. Xgeva is also used to treat giant cell tumor of the bone. COMMON BRAND NAME(S): Prolia, XGEVA What should I tell my health care provider before I take this medicine? They need to know if you have any of these conditions: -dental disease -eczema -infection or history of infections -kidney disease or on dialysis -low blood calcium or vitamin D -malabsorption syndrome -scheduled to have surgery or tooth extraction -taking medicine that contains denosumab -thyroid or parathyroid disease -an unusual reaction to denosumab, other medicines, foods, dyes, or preservatives -pregnant or trying to get pregnant -breast-feeding How should I use this medicine? This medicine is for injection under the skin. It is given by a health care professional in a hospital or clinic setting. If you are getting Prolia, a special MedGuide will be given to you by the pharmacist with each prescription and refill. Be sure to read this information carefully each time. For Prolia, talk to your pediatrician regarding the use of this medicine in children. Special care may be needed. For Xgeva, talk to your pediatrician regarding the use of this medicine in children. While this drug may be prescribed for children as young as 13 years for selected conditions, precautions do apply. What if I miss a dose? It is important not to miss your dose. Call your doctor or health care professional if you are unable to keep an appointment. What may interact with this medicine? Do not take this medicine with any of the following medications: -other medicines containing denosumab This medicine may also interact with the following medications: -medicines that suppress the immune  system -medicines that treat cancer -steroid medicines like prednisone or cortisone What should I watch for while using this medicine? Visit your doctor or health care professional for regular checks on your progress. Your doctor or health care professional may order blood tests and other tests to see how you are doing. Call your doctor or health care professional if you get a cold or other infection while receiving this medicine. Do not treat yourself. This medicine may decrease your body's ability to fight infection. You should make sure you get enough calcium and vitamin D while you are taking this medicine, unless your doctor tells you not to. Discuss the foods you eat and the vitamins you take with your health care professional. See your dentist regularly. Brush and floss your teeth as directed. Before you have any dental work done, tell your dentist you are receiving this medicine. Do not become pregnant while taking this medicine or for 5 months after stopping it. Women should inform their doctor if they wish to become pregnant or think they might be pregnant. There is a potential for serious side effects to an unborn child. Talk to your health care professional or pharmacist for more information. What side effects may I notice from receiving this medicine? Side effects that you should report to your doctor or health care professional as soon as possible: -allergic reactions like skin rash, itching or hives, swelling of the face, lips, or tongue -breathing problems -chest pain -fast, irregular heartbeat -feeling faint or lightheaded, falls -fever, chills, or any other sign of infection -muscle spasms, tightening, or twitches -numbness or tingling -skin blisters or bumps, or is dry, peels, or red -slow   healing or unexplained pain in the mouth or jaw -unusual bleeding or bruising Side effects that usually do not require medical attention (report to your doctor or health care professional  if they continue or are bothersome): -muscle pain -stomach upset, gas Where should I keep my medicine? This medicine is only given in a clinic, doctor's office, or other health care setting and will not be stored at home.  2017 Elsevier/Gold Standard (2015-04-14 10:06:55)  

## 2016-06-28 LAB — PSA: Prostate Specific Ag, Serum: 14.1 ng/mL — ABNORMAL HIGH (ref 0.0–4.0)

## 2016-06-28 MED ORDER — ABIRATERONE ACETATE 250 MG PO TABS: 1000.0000 mg | ORAL_TABLET | Freq: Every day | ORAL | 0 refills | Status: DC

## 2016-06-28 MED ORDER — PREDNISONE 5 MG PO TABS: 5.0000 mg | ORAL_TABLET | Freq: Every day | ORAL | 3 refills | Status: DC

## 2016-06-28 NOTE — Addendum Note (Signed)
Addended by: Wyatt Portela on: 06/28/2016 09:56 AM   Modules accepted: Orders

## 2016-07-03 ENCOUNTER — Telehealth: Payer: Self-pay | Admitting: Pharmacist

## 2016-07-03 DIAGNOSIS — C61 Malignant neoplasm of prostate: Secondary | ICD-10-CM

## 2016-07-03 MED ORDER — ABIRATERONE ACETATE 250 MG PO TABS: 1000.0000 mg | ORAL_TABLET | Freq: Every day | ORAL | 0 refills | Status: DC

## 2016-07-03 NOTE — Telephone Encounter (Signed)
Oral Chemotherapy Pharmacist Encounter  Received new prescription for Zytiga for patient to use for metastatic, castration-resistant prostate cancer Labs from 06/27/16 reviewed, OK for treatment Current medication list in Epic assessed, no significant DDIs with Zytiga identified  Noted patient with hypertensive BP readings since starting Xtandi back in May 2017. This will be discussed with MD and patient. Currently patient is not on any anti-hypertensive medication therapy per list in Epic.  Prescription will be sent o [facility] for benefits analysis.  Oral oncology Clinic will continue to follow.  Johny Drilling, PharmD, BCPS, BCOP 07/03/2016  10:22 AM Oral Oncology Clinic [phone]

## 2016-07-04 ENCOUNTER — Encounter: Payer: Self-pay | Admitting: Oncology

## 2016-07-05 DIAGNOSIS — H40003 Preglaucoma, unspecified, bilateral: Secondary | ICD-10-CM | POA: Diagnosis not present

## 2016-07-10 ENCOUNTER — Encounter: Payer: Self-pay | Admitting: *Deleted

## 2016-07-10 ENCOUNTER — Encounter: Payer: Self-pay | Admitting: Oncology

## 2016-07-10 ENCOUNTER — Telehealth: Payer: Self-pay | Admitting: *Deleted

## 2016-07-10 NOTE — Telephone Encounter (Signed)
Spoke with patient, per Johny Drilling in pharmacy, there is a problem with getting zytiga, she is [facility] now. She will let him know when we get it approved.

## 2016-07-10 NOTE — Telephone Encounter (Signed)
Oral Chemotherapy Pharmacist Encounter  Received notification from [place] that prior authorization was needed for patient's Zytiga Attempted PA on CoverMyMeds, unable to respond to clinical questions I called [facility] at [phone] Prior authorization submitted over the phone Status is pending  [facility] will continue to follow.  Johny Drilling, PharmD, BCPS, BCOP 07/10/2016  4:37 PM Oral Oncology Clinic [phone]

## 2016-07-11 NOTE — Telephone Encounter (Signed)
Oral Chemotherapy Pharmacist Encounter  Received notification from [employer] that prior authorization for Fabio Asa has been approved Effective dates: 06/24/16-07/10/19  I alerted [facility] to continue to process patient's prescription. Copayment [phone]  Due to patient's income, he will likely not qualify for manufacturer or foundation copayment assistance. Patient notified. I offered to completed manufacturer assistance application despite possibility that he may be denied their assistance.   Patient will think about it, but the Zytiga may be prohibitively expensive.  Oral Oncology Clinic will continue to follow.  Johny Drilling, PharmD, BCPS, BCOP 07/11/2016  4:57 PM Oral Oncology Clinic [phone]

## 2016-07-12 ENCOUNTER — Encounter: Payer: Self-pay | Admitting: Oncology

## 2016-07-26 ENCOUNTER — Telehealth: Payer: Self-pay | Admitting: Oncology

## 2016-07-26 ENCOUNTER — Other Ambulatory Visit: Payer: Self-pay | Admitting: *Deleted

## 2016-07-26 ENCOUNTER — Other Ambulatory Visit (HOSPITAL_BASED_OUTPATIENT_CLINIC_OR_DEPARTMENT_OTHER): Payer: Medicare Other

## 2016-07-26 ENCOUNTER — Ambulatory Visit: Payer: Medicare Other

## 2016-07-26 ENCOUNTER — Ambulatory Visit (HOSPITAL_BASED_OUTPATIENT_CLINIC_OR_DEPARTMENT_OTHER): Payer: Medicare Other | Admitting: Oncology

## 2016-07-26 VITALS — BP 171/75 | HR 73 | Temp 98.4°F | Resp 18 | Ht 71.0 in | Wt 167.8 lb

## 2016-07-26 DIAGNOSIS — C61 Malignant neoplasm of prostate: Secondary | ICD-10-CM

## 2016-07-26 DIAGNOSIS — R110 Nausea: Secondary | ICD-10-CM

## 2016-07-26 DIAGNOSIS — D709 Neutropenia, unspecified: Secondary | ICD-10-CM | POA: Diagnosis not present

## 2016-07-26 LAB — CBC WITH DIFFERENTIAL/PLATELET
BASO%: 0.4 % (ref 0.0–2.0)
BASOS ABS: 0 10*3/uL (ref 0.0–0.1)
EOS ABS: 0.1 10*3/uL (ref 0.0–0.5)
EOS%: 1.8 % (ref 0.0–7.0)
HEMATOCRIT: 40.8 % (ref 38.4–49.9)
HEMOGLOBIN: 13.8 g/dL (ref 13.0–17.1)
LYMPH#: 1.3 10*3/uL (ref 0.9–3.3)
LYMPH%: 28.8 % (ref 14.0–49.0)
MCH: 29.8 pg (ref 27.2–33.4)
MCHC: 33.9 g/dL (ref 32.0–36.0)
MCV: 87.9 fL (ref 79.3–98.0)
MONO#: 0.4 10*3/uL (ref 0.1–0.9)
MONO%: 7.9 % (ref 0.0–14.0)
NEUT#: 2.8 10*3/uL (ref 1.5–6.5)
NEUT%: 61.1 % (ref 39.0–75.0)
Platelets: 217 10*3/uL (ref 140–400)
RBC: 4.64 10*6/uL (ref 4.20–5.82)
RDW: 12.9 % (ref 11.0–14.6)
WBC: 4.6 10*3/uL (ref 4.0–10.3)

## 2016-07-26 LAB — COMPREHENSIVE METABOLIC PANEL
ALT: 9 U/L (ref 0–55)
ANION GAP: 9 meq/L (ref 3–11)
AST: 11 U/L (ref 5–34)
Albumin: 4.3 g/dL (ref 3.5–5.0)
Alkaline Phosphatase: 64 U/L (ref 40–150)
BUN: 19.9 mg/dL (ref 7.0–26.0)
CHLORIDE: 110 meq/L — AB (ref 98–109)
CO2: 24 meq/L (ref 22–29)
Calcium: 10.1 mg/dL (ref 8.4–10.4)
Creatinine: 1.1 mg/dL (ref 0.7–1.3)
EGFR: 67 mL/min/{1.73_m2} — AB (ref >90)
GLUCOSE: 100 mg/dL (ref 70–140)
POTASSIUM: 4.2 meq/L (ref 3.5–5.1)
SODIUM: 143 meq/L (ref 136–145)
Total Bilirubin: 0.57 mg/dL (ref 0.20–1.20)
Total Protein: 7.2 g/dL (ref 6.4–8.3)

## 2016-07-26 MED ORDER — ONDANSETRON HCL 4 MG PO TABS: 4.0000 mg | ORAL_TABLET | Freq: Three times a day (TID) | ORAL | 1 refills | Status: DC | PRN

## 2016-07-26 MED ORDER — DENOSUMAB 120 MG/1.7ML ~~LOC~~ SOLN
120.0000 mg | Freq: Once | SUBCUTANEOUS | Status: AC
  Filled 2016-07-26: qty 1.7

## 2016-07-26 NOTE — Progress Notes (Signed)
Hematology and Oncology Follow Up Visit  [name] [id number] [birth year] 71 y.o. 07/26/2016 10:19 AM   Principle Diagnosis: 71 year old gentleman with prostate cancer diagnosed in October 2015. He had a Gleason score 4+5 = 9 and a PSA of 116. Abdominal imaging showed bulky retroperitoneal lymphadenopathy.   Prior Therapy:  Status post prostate biopsy on 01/19/2014. Systemic chemotherapy in the form of Taxotere 75 mg/m every 3 weeks with plans for total of 6 cycles. Cycle 1 given on 05/05/2014. He is status post 6 cycles of therapy concluded on 08/20/2014. Xtandi 160 mg daily started on 07/25/2015. Therapy discontinued in May 2018 because of progression of disease.   Current therapy:  Androgen deprivation under the care of Dr. Tresa [name]. He receives Lupron every 6 months.  Xgeva monthly injections to start on 02/02/2016.  Under evaluation to start different salvage therapy.  Interim History:  [name] presents today for a follow-up visit with his wife. Since the last visit, he reports few complaints including excessive fatigue and nausea. He is tolerating calcium and vitamin D supplements poorly and attributes a lot of his symptoms to that. He felt better after discontinuation of the supplements. He attempted to obtain Zytiga since the last visit although due to insurance coverage he is not able to get it. He has been off treatment for the last 3 weeks. His performance status is also declined and is not excising as frequently as has before. He is not reporting any increase bone pain or pathological fracture. Is not reporting any abdominal pain or early satiety. His intake has decreased and lost close to 10 pounds.   He does not report any headaches, blurry vision or syncope. He does not report any seizures or any neurological symptoms. He does not report any fevers, chills, weight loss or decline his energy. He does not report any chest pain, palpitation, orthopnea or leg edema. He does not  report any cough, hemoptysis or hematemesis. He does not report any nausea, vomiting, abdominal pain, constipation or diarrhea.  Remainder of his review of systems unremarkable.   Medications: I have reviewed the patient's current medications.  Current Outpatient Prescriptions  Medication Sig Dispense Refill  . aspirin 81 MG tablet Take 81 mg by mouth every morning.     . ondansetron (ZOFRAN) 4 MG tablet Take 1 tablet (4 mg total) by mouth every 8 (eight) hours as needed for nausea or vomiting. 20 tablet 0  . abiraterone Acetate (ZYTIGA) 250 MG tablet Take 4 tablets (1,000 mg total) by mouth daily. Take on an empty stomach 1 hour before or 2 hours after a meal (Patient not taking: Reported on 07/26/2016) 120 tablet 0  . calcium-vitamin D (OSCAL WITH D) 500-200 MG-UNIT tablet Take 1 tablet by mouth 2 (two) times daily. (Patient not taking: Reported on 07/26/2016) 60 tablet 3  . diphenhydramine-acetaminophen (TYLENOL PM) 25-500 MG TABS tablet Take 2 tablets by mouth at bedtime as needed.    . predniSONE (DELTASONE) 5 MG tablet Take 1 tablet (5 mg total) by mouth daily with breakfast. (Patient not taking: Reported on 07/26/2016) 30 tablet 3  . pseudoephedrine (SUDAFED) 30 MG tablet Take 30 mg by mouth as directed.     No current facility-administered medications for this visit.      Allergies: No Known Allergies  Past Medical History, Surgical history, Social history, and Family History were reviewed and updated.   Physical Exam: Blood pressure (!) 171/75, pulse 73, temperature 98.4 F (36.9 C), temperature source Oral, resp.  rate 18, height 5\' 11"  (1.803 m), weight 167 lb 12.8 oz (76.1 kg), SpO2 100 %. ECOG: 0 General appearance: Chronically ill-appearing gentleman appeared without distress today. Head: Normocephalic, without obvious abnormality no oral thrush or ulcers. Neck: no adenopathy no thyroid masses. Lymph nodes: Cervical, supraclavicular, and axillary nodes normal. Heart:regular rate  and rhythm, S1, S2 normal, no murmur, click, rub or gallop Lung:chest clear, no wheezing, rales, normal symmetric air entry no dullness to percussion. Abdomin: soft, non-tender, without masses or organomegaly no shifting dullness or ascites. EXT:no erythema, induration, or nodules   Lab Results: Lab Results  Component Value Date   WBC 4.6 07/26/2016   HGB 13.8 07/26/2016   HCT 40.8 07/26/2016   MCV 87.9 07/26/2016   PLT 217 07/26/2016     Chemistry      Component Value Date/Time   NA 143 07/26/2016 0913   K 4.2 07/26/2016 0913   CL 108 10/04/2014 0749   CO2 24 07/26/2016 0913   BUN 19.9 07/26/2016 0913   CREATININE 1.1 07/26/2016 0913      Component Value Date/Time   CALCIUM 10.1 07/26/2016 0913   ALKPHOS 64 07/26/2016 0913   AST 11 07/26/2016 0913   ALT 9 07/26/2016 0913   BILITOT 0.57 07/26/2016 0913      Results for [name] (MRN [id number]) as of 07/26/2016 10:22  Ref. Range 04/10/2016 12:16 05/18/2016 11:53 06/27/2016 11:46  PSA Latest Ref Range: 0.0 - 4.0 ng/mL 6.9 (H) 10.8 (H) 14.1 (H)     Impression and Plan:  71 year old gentleman with the following issues:  1. Advanced prostate cancer presenting with stage IV disease. His Gleason score is 4+5 = 9 with heavy volume disease involving 11 out of 12 cores of his biopsy obtained in October 2015. His PSA is 116 and his staging workup revealed lymphadenopathy in the periaortic and iliac chains. He started androgen depravation under the care of Dr. Tresa [name].  He is status post 6 cycles of chemotherapy and currently receiving androgen deprivation.    PSA increased up to 5.4 and staging workup on 07/19/2015 with bone scan is indicating bony metastasis including activity in the sixth rib and the inferior left ilium. 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  He started on Xtandi on 07/25/2015 and have tolerated it well.   CT scan and bone scan obtained on 04/10/2016  showed some mild progression of his disease predominantly in the bone. No visceral metastasis noted.  His PSA continues to rise indicating progression of disease with a PSA up to 14. Given his progression of disease and inability to obtain Zytiga, I recommended restarting systemic chemotherapy. Risks and benefits of Jevtana chemotherapy was reviewed today. Complications: Nausea, vomiting, myelosuppression, fatigue, diarrhea, neuropathy and rarely severe illness that requires hospitalizations and death. The benefit would be improvement in his symptoms and controlling his cancer long-term. After discussion today, he is agreeable to proceed with this treatment.   2. Mild nausea and anorexia: Likely related to his cancer progression. Zofran was refilled for him.  3. Androgen depravation: This will continued under the care of Dr. Tresa [name]. He expressed interest in receiving Lupron at the [facility]. His *Strine February 2018 continues to be castrate.  4. Bone directed therapy: Delton See will be on hold for the time being until his symptoms improve.  5. IV access: Peripheral veins will be used for the time being. Port-A-Cath can be reinserted in the future.  6. Neutropenia prophylaxis:  He will receive Neulasta after each chemotherapy treatment.  7. Follow-up: Will be in the next 2 weeks to start chemotherapy and every 3 weeks after that.   Jhony Antrim,MD 5/3/201810:19 AM

## 2016-07-26 NOTE — Patient Instructions (Signed)
Denosumab injection What is this medicine? DENOSUMAB (den oh sue mab) slows bone breakdown. Prolia is used to treat osteoporosis in women after menopause and in men. Xgeva is used to prevent bone fractures and other bone problems caused by cancer bone metastases. Xgeva is also used to treat giant cell tumor of the bone. COMMON BRAND NAME(S): Prolia, XGEVA What should I tell my health care provider before I take this medicine? They need to know if you have any of these conditions: -dental disease -eczema -infection or history of infections -kidney disease or on dialysis -low blood calcium or vitamin D -malabsorption syndrome -scheduled to have surgery or tooth extraction -taking medicine that contains denosumab -thyroid or parathyroid disease -an unusual reaction to denosumab, other medicines, foods, dyes, or preservatives -pregnant or trying to get pregnant -breast-feeding How should I use this medicine? This medicine is for injection under the skin. It is given by a health care professional in a hospital or clinic setting. If you are getting Prolia, a special MedGuide will be given to you by the pharmacist with each prescription and refill. Be sure to read this information carefully each time. For Prolia, talk to your pediatrician regarding the use of this medicine in children. Special care may be needed. For Xgeva, talk to your pediatrician regarding the use of this medicine in children. While this drug may be prescribed for children as young as 13 years for selected conditions, precautions do apply. What if I miss a dose? It is important not to miss your dose. Call your doctor or health care professional if you are unable to keep an appointment. What may interact with this medicine? Do not take this medicine with any of the following medications: -other medicines containing denosumab This medicine may also interact with the following medications: -medicines that suppress the immune  system -medicines that treat cancer -steroid medicines like prednisone or cortisone What should I watch for while using this medicine? Visit your doctor or health care professional for regular checks on your progress. Your doctor or health care professional may order blood tests and other tests to see how you are doing. Call your doctor or health care professional if you get a cold or other infection while receiving this medicine. Do not treat yourself. This medicine may decrease your body's ability to fight infection. You should make sure you get enough calcium and vitamin D while you are taking this medicine, unless your doctor tells you not to. Discuss the foods you eat and the vitamins you take with your health care professional. See your dentist regularly. Brush and floss your teeth as directed. Before you have any dental work done, tell your dentist you are receiving this medicine. Do not become pregnant while taking this medicine or for 5 months after stopping it. Women should inform their doctor if they wish to become pregnant or think they might be pregnant. There is a potential for serious side effects to an unborn child. Talk to your health care professional or pharmacist for more information. What side effects may I notice from receiving this medicine? Side effects that you should report to your doctor or health care professional as soon as possible: -allergic reactions like skin rash, itching or hives, swelling of the face, lips, or tongue -breathing problems -chest pain -fast, irregular heartbeat -feeling faint or lightheaded, falls -fever, chills, or any other sign of infection -muscle spasms, tightening, or twitches -numbness or tingling -skin blisters or bumps, or is dry, peels, or red -slow   healing or unexplained pain in the mouth or jaw -unusual bleeding or bruising Side effects that usually do not require medical attention (report to your doctor or health care professional  if they continue or are bothersome): -muscle pain -stomach upset, gas Where should I keep my medicine? This medicine is only given in a clinic, doctor's office, or other health care setting and will not be stored at home.  2017 Elsevier/Gold Standard (2015-04-14 10:06:55)  

## 2016-07-26 NOTE — Telephone Encounter (Signed)
Gave patient AVS and calender per 5/3 los.  

## 2016-07-26 NOTE — Progress Notes (Signed)
START ON PATHWAY REGIMEN - Prostate     A cycle is every 21 days.:     Cabazitaxel      Prednisone   **Always confirm dose/schedule in your pharmacy ordering system**    Patient Characteristics: Adenocarcinoma, Metastatic, Castration Resistant, Symptomatic, Prior Docetaxel/Docetaxel Ineligible Current radiographic evidence of distant metastasis? Yes Histology: Adenocarcinoma AJCC T Category: Staged < 8th Ed. Gleason Primary: Staged < 8th Ed. AJCC N Category: Staged < 8th Ed. Gleason Secondary: Staged < 8th Ed. AJCC M Category: Staged < 8th Ed. Gleason Score: X AJCC 8 Stage Grouping: Unknown PSA Values (ng/mL): Staged < 8th Ed. Would you be surprised if this patient died  in the next year? I would be surprised if this patient died in the next year  Intent of Therapy: Non-Curative / Palliative Intent, Discussed with Patient

## 2016-07-27 LAB — PSA: PROSTATE SPECIFIC AG, SERUM: 22.8 ng/mL — AB (ref 0.0–4.0)

## 2016-08-08 ENCOUNTER — Ambulatory Visit (HOSPITAL_BASED_OUTPATIENT_CLINIC_OR_DEPARTMENT_OTHER): Payer: Medicare Other

## 2016-08-08 ENCOUNTER — Other Ambulatory Visit (HOSPITAL_BASED_OUTPATIENT_CLINIC_OR_DEPARTMENT_OTHER): Payer: Medicare Other

## 2016-08-08 VITALS — BP 139/88 | HR 68 | Temp 98.5°F | Resp 18

## 2016-08-08 DIAGNOSIS — C61 Malignant neoplasm of prostate: Secondary | ICD-10-CM

## 2016-08-08 DIAGNOSIS — Z5111 Encounter for antineoplastic chemotherapy: Secondary | ICD-10-CM

## 2016-08-08 DIAGNOSIS — C7951 Secondary malignant neoplasm of bone: Secondary | ICD-10-CM | POA: Diagnosis not present

## 2016-08-08 LAB — COMPREHENSIVE METABOLIC PANEL
ALT: 9 U/L (ref 0–55)
AST: 9 U/L (ref 5–34)
Albumin: 4.1 g/dL (ref 3.5–5.0)
Alkaline Phosphatase: 64 U/L (ref 40–150)
Anion Gap: 10 mEq/L (ref 3–11)
BUN: 16.8 mg/dL (ref 7.0–26.0)
CALCIUM: 9.6 mg/dL (ref 8.4–10.4)
CHLORIDE: 113 meq/L — AB (ref 98–109)
CO2: 22 mEq/L (ref 22–29)
Creatinine: 1.2 mg/dL (ref 0.7–1.3)
EGFR: 64 mL/min/{1.73_m2} — ABNORMAL LOW (ref >90)
Glucose: 109 mg/dl (ref 70–140)
Potassium: 3.9 mEq/L (ref 3.5–5.1)
Sodium: 144 mEq/L (ref 136–145)
Total Bilirubin: 0.48 mg/dL (ref 0.20–1.20)
Total Protein: 7 g/dL (ref 6.4–8.3)

## 2016-08-08 LAB — CBC WITH DIFFERENTIAL/PLATELET
BASO%: 0.3 % (ref 0.0–2.0)
Basophils Absolute: 0 10*3/uL (ref 0.0–0.1)
EOS%: 1.3 % (ref 0.0–7.0)
Eosinophils Absolute: 0.1 10*3/uL (ref 0.0–0.5)
HEMATOCRIT: 41 % (ref 38.4–49.9)
HEMOGLOBIN: 13.7 g/dL (ref 13.0–17.1)
LYMPH#: 1.6 10*3/uL (ref 0.9–3.3)
LYMPH%: 29.5 % (ref 14.0–49.0)
MCH: 29.3 pg (ref 27.2–33.4)
MCHC: 33.5 g/dL (ref 32.0–36.0)
MCV: 87.5 fL (ref 79.3–98.0)
MONO#: 0.5 10*3/uL (ref 0.1–0.9)
MONO%: 8.5 % (ref 0.0–14.0)
NEUT#: 3.2 10*3/uL (ref 1.5–6.5)
NEUT%: 60.4 % (ref 39.0–75.0)
Platelets: 214 10*3/uL (ref 140–400)
RBC: 4.68 10*6/uL (ref 4.20–5.82)
RDW: 13.5 % (ref 11.0–14.6)
WBC: 5.3 10*3/uL (ref 4.0–10.3)

## 2016-08-08 MED ORDER — FAMOTIDINE IN NACL 20-0.9 MG/50ML-% IV SOLN
20.0000 mg | Freq: Once | INTRAVENOUS | Status: AC
  Administered 2016-08-08: 20 mg via INTRAVENOUS

## 2016-08-08 MED ORDER — SODIUM CHLORIDE 0.9 % IV SOLN
Freq: Once | INTRAVENOUS | Status: AC
  Administered 2016-08-08: 09:00:00 via INTRAVENOUS

## 2016-08-08 MED ORDER — DIPHENHYDRAMINE HCL 50 MG/ML IJ SOLN
25.0000 mg | Freq: Once | INTRAMUSCULAR | Status: AC
  Administered 2016-08-08: 25 mg via INTRAVENOUS

## 2016-08-08 MED ORDER — FAMOTIDINE IN NACL 20-0.9 MG/50ML-% IV SOLN
INTRAVENOUS | Status: AC
  Filled 2016-08-08: qty 50

## 2016-08-08 MED ORDER — DEXAMETHASONE SODIUM PHOSPHATE 10 MG/ML IJ SOLN
INTRAMUSCULAR | Status: AC
  Filled 2016-08-08: qty 1

## 2016-08-08 MED ORDER — DIPHENHYDRAMINE HCL 50 MG/ML IJ SOLN
INTRAMUSCULAR | Status: AC
  Filled 2016-08-08: qty 1

## 2016-08-08 MED ORDER — DEXAMETHASONE SODIUM PHOSPHATE 10 MG/ML IJ SOLN
10.0000 mg | Freq: Once | INTRAMUSCULAR | Status: AC
  Administered 2016-08-08: 10 mg via INTRAVENOUS

## 2016-08-08 MED ORDER — DEXTROSE 5 % IV SOLN
25.5000 mg/m2 | Freq: Once | INTRAVENOUS | Status: AC
  Administered 2016-08-08: 50 mg via INTRAVENOUS
  Filled 2016-08-08: qty 5

## 2016-08-08 NOTE — Patient Instructions (Addendum)
[facility] Discharge Instructions for Patients Receiving Chemotherapy  Today you received the following chemotherapy agents jevtana  To help prevent nausea and vomiting after your treatment, we encourage you to take your nausea medication as directed  If you develop nausea and vomiting that is not controlled by your nausea medication, call the clinic.   BELOW ARE SYMPTOMS THAT SHOULD BE REPORTED IMMEDIATELY:  *FEVER GREATER THAN 100.5 F  *CHILLS WITH OR WITHOUT FEVER  NAUSEA AND VOMITING THAT IS NOT CONTROLLED WITH YOUR NAUSEA MEDICATION  *UNUSUAL SHORTNESS OF BREATH  *UNUSUAL BRUISING OR BLEEDING  TENDERNESS IN MOUTH AND THROAT WITH OR WITHOUT PRESENCE OF ULCERS  *URINARY PROBLEMS  *BOWEL PROBLEMS  UNUSUAL RASH Items with * indicate a potential emergency and should be followed up as soon as possible.  Feel free to call the clinic you have any questions or concerns. The clinic phone number is [phone].  Cabazitaxel injection What is this medicine? CABAZITAXEL (ka baz i TAX el) is a chemotherapy drug. It is used to treat prostate cancer. It targets fast dividing cells, like cancer cells, and causes these cells to die. This medicine may be used for other purposes; ask your health care provider or pharmacist if you have questions. COMMON BRAND NAME(S): Jevtana What should I tell my health care provider before I take this medicine? They need to know if you have any of these conditions: -history of stomach bleeding -kidney disease -liver disease -low blood counts, like low white cell, platelet, or red cell counts -lung or breathing disease, like asthma -recent or ongoing radiation therapy -take medicines that treat or prevent blood clots -an unusual or allergic reaction to cabazitaxel, polysorbate 80, other medicines, foods, dyes, or preservatives -pregnant or trying to get pregnant -breast-feeding How should I use this medicine? This medicine is for  infusion into a vein. It is given by a health care professional in a hospital or clinic setting. Talk to your pediatrician regarding the use of this medicine in children. Special care may be needed. Overdosage: If you think you have taken too much of this medicine contact a poison control center or emergency room at once. NOTE: This medicine is only for you. Do not share this medicine with others. What if I miss a dose? It is important not to miss your dose. Call your doctor or health care professional if you are unable to keep an appointment. What may interact with this medicine? -antiviral medicines for HIV or AIDS -clarithromycin -medicines for fungal infections like ketoconazole, fluconazole, itraconazole, and voriconazole -nefazodone -telithromycin This list may not describe all possible interactions. Give your health care provider a list of all the medicines, herbs, non-prescription drugs, or dietary supplements you use. Also tell them if you smoke, drink alcohol, or use illegal drugs. Some items may interact with your medicine. What should I watch for while using this medicine? Your condition will be monitored carefully while you are receiving this medicine. This drug may make you feel generally unwell. This is not uncommon, as chemotherapy can affect healthy cells as well as cancer cells. Report any side effects. Continue your course of treatment even though you feel ill unless your doctor tells you to stop. Call your doctor or health care professional for advice if you get a fever, chills or sore throat, or other symptoms of a cold or flu. Do not treat yourself. This drug decreases your body's ability to fight infections. Try to avoid being around people who are sick. This  medicine may increase your risk to bruise or bleed. Call your doctor or health care professional if you notice any unusual bleeding. Be careful brushing and flossing your teeth or using a toothpick because you may get an  infection or bleed more easily. If you have any dental work done, tell your dentist you are receiving this medicine. Avoid taking products that contain aspirin, acetaminophen, ibuprofen, naproxen, or ketoprofen unless instructed by your doctor. These medicines may hide a fever. Do not become pregnant while taking this medicine. Women should inform their doctor if they wish to become pregnant or think they might be pregnant. Men should not father a child while taking this medicine and for 3 months after stopping it. There is a potential for serious side effects to an unborn child. Talk to your health care professional or pharmacist for more information. Do not breast-feed an infant while taking this medicine. What side effects may I notice from receiving this medicine? Side effects that you should report to your doctor or health care professional as soon as possible: -allergic reactions like skin rash, itching or hives, swelling of the face, lips, or tongue -blood in the urine -breathing problems -constipation -dark urine -diarrhea -pain in the lower back or side -pain, tingling, numbness in the hands or feet -pain when urinating -severe abdominal pain -signs of infection - fever or chills, cough, sore throat, pain or difficulty passing urine -signs and symptoms of kidney injury like trouble passing urine or change in the amount of urine -signs of decreased platelets or bleeding - bruising, pinpoint red spots on the skin, black, tarry stools, blood in the urine -signs of decreased red blood cells - unusually weak or tired, fainting spells, lightheadedness -vomiting Side effects that usually do not require medical attention (report to your doctor or health care professional if they continue or are bothersome): -back pain -change in taste -hair loss -headache -loss of appetite -muscle or joint pain -nausea -upset stomach This list may not describe all possible side effects. Call your doctor  for medical advice about side effects. You may report side effects to FDA at 1-800-FDA-1088. Where should I keep my medicine? This drug is given in a hospital or clinic and will not be stored at home. NOTE: This sheet is a summary. It may not cover all possible information. If you have questions about this medicine, talk to your doctor, pharmacist, or health care provider.  2018 Elsevier/Gold Standard (2016-04-06 15:38:47)

## 2016-08-09 ENCOUNTER — Ambulatory Visit (HOSPITAL_BASED_OUTPATIENT_CLINIC_OR_DEPARTMENT_OTHER): Payer: Medicare Other

## 2016-08-09 VITALS — BP 144/74 | HR 79 | Temp 97.7°F | Resp 20

## 2016-08-09 DIAGNOSIS — C61 Malignant neoplasm of prostate: Secondary | ICD-10-CM

## 2016-08-09 DIAGNOSIS — Z5189 Encounter for other specified aftercare: Secondary | ICD-10-CM | POA: Diagnosis not present

## 2016-08-09 DIAGNOSIS — C7951 Secondary malignant neoplasm of bone: Secondary | ICD-10-CM

## 2016-08-09 LAB — PSA: PROSTATE SPECIFIC AG, SERUM: 28.8 ng/mL — AB (ref 0.0–4.0)

## 2016-08-09 MED ORDER — PEGFILGRASTIM INJECTION 6 MG/0.6ML ~~LOC~~
6.0000 mg | PREFILLED_SYRINGE | Freq: Once | SUBCUTANEOUS | Status: AC
  Administered 2016-08-09: 6 mg via SUBCUTANEOUS
  Filled 2016-08-09: qty 0.6

## 2016-08-09 NOTE — Patient Instructions (Signed)
Pegfilgrastim injection What is this medicine? PEGFILGRASTIM (PEG fil gra stim) is a long-acting granulocyte colony-stimulating factor that stimulates the growth of neutrophils, a type of white blood cell important in the body's fight against infection. It is used to reduce the incidence of fever and infection in patients with certain types of cancer who are receiving chemotherapy that affects the bone marrow, and to increase survival after being exposed to high doses of radiation. This medicine may be used for other purposes; ask your health care provider or pharmacist if you have questions. COMMON BRAND NAME(S): Neulasta What should I tell my health care provider before I take this medicine? They need to know if you have any of these conditions: -kidney disease -latex allergy -ongoing radiation therapy -sickle cell disease -skin reactions to acrylic adhesives (On-Body Injector only) -an unusual or allergic reaction to pegfilgrastim, filgrastim, other medicines, foods, dyes, or preservatives -pregnant or trying to get pregnant -breast-feeding How should I use this medicine? This medicine is for injection under the skin. If you get this medicine at home, you will be taught how to prepare and give the pre-filled syringe or how to use the On-body Injector. Refer to the patient Instructions for Use for detailed instructions. Use exactly as directed. Tell your healthcare provider immediately if you suspect that the On-body Injector may not have performed as intended or if you suspect the use of the On-body Injector resulted in a missed or partial dose. It is important that you put your used needles and syringes in a special sharps container. Do not put them in a trash can. If you do not have a sharps container, call your pharmacist or healthcare provider to get one. Talk to your pediatrician regarding the use of this medicine in children. While this drug may be prescribed for selected conditions,  precautions do apply. Overdosage: If you think you have taken too much of this medicine contact a poison control center or emergency room at once. NOTE: This medicine is only for you. Do not share this medicine with others. What if I miss a dose? It is important not to miss your dose. Call your doctor or health care professional if you miss your dose. If you miss a dose due to an On-body Injector failure or leakage, a new dose should be administered as soon as possible using a single prefilled syringe for manual use. What may interact with this medicine? Interactions have not been studied. Give your health care provider a list of all the medicines, herbs, non-prescription drugs, or dietary supplements you use. Also tell them if you smoke, drink alcohol, or use illegal drugs. Some items may interact with your medicine. This list may not describe all possible interactions. Give your health care provider a list of all the medicines, herbs, non-prescription drugs, or dietary supplements you use. Also tell them if you smoke, drink alcohol, or use illegal drugs. Some items may interact with your medicine. What should I watch for while using this medicine? You may need blood work done while you are taking this medicine. If you are going to need a MRI, CT scan, or other procedure, tell your doctor that you are using this medicine (On-Body Injector only). What side effects may I notice from receiving this medicine? Side effects that you should report to your doctor or health care professional as soon as possible: -allergic reactions like skin rash, itching or hives, swelling of the face, lips, or tongue -dizziness -fever -pain, redness, or irritation at site   where injected -pinpoint red spots on the skin -red or dark-brown urine -shortness of breath or breathing problems -stomach or side pain, or pain at the shoulder -swelling -tiredness -trouble passing urine or change in the amount of urine Side  effects that usually do not require medical attention (report to your doctor or health care professional if they continue or are bothersome): -bone pain -muscle pain This list may not describe all possible side effects. Call your doctor for medical advice about side effects. You may report side effects to FDA at 1-800-FDA-1088. Where should I keep my medicine? Keep out of the reach of children. Store pre-filled syringes in a refrigerator between 2 and 8 degrees C (36 and 46 degrees F). Do not freeze. Keep in carton to protect from light. Throw away this medicine if it is left out of the refrigerator for more than 48 hours. Throw away any unused medicine after the expiration date. NOTE: This sheet is a summary. It may not cover all possible information. If you have questions about this medicine, talk to your doctor, pharmacist, or health care provider.  2018 Elsevier/Gold Standard (2016-03-08 12:58:03)  

## 2016-08-10 ENCOUNTER — Other Ambulatory Visit: Payer: Self-pay | Admitting: *Deleted

## 2016-08-10 MED ORDER — PROCHLORPERAZINE MALEATE 10 MG PO TABS: 10.0000 mg | ORAL_TABLET | Freq: Four times a day (QID) | ORAL | 1 refills | Status: DC | PRN

## 2016-08-10 NOTE — Telephone Encounter (Signed)
Called patient for follow up of first chemotherapy. Patient stated,"my only problem is that I'm having waves of nausea intermittenley. I'm taking Claritin and Tylenol for the pain from the Neulasta shot. Per Dr. [name], I escribed a prescription for Compazine to his pharmacy. Informed patient that he can alternate the Compazine and Zofran. Instructed him to call [phone] over the weekend if the nausea doesn't get better and he can talk with the on call physician. Patient verbalized understanding.

## 2016-08-14 ENCOUNTER — Other Ambulatory Visit: Payer: Self-pay | Admitting: *Deleted

## 2016-08-14 ENCOUNTER — Ambulatory Visit (HOSPITAL_BASED_OUTPATIENT_CLINIC_OR_DEPARTMENT_OTHER): Payer: Medicare Other

## 2016-08-14 ENCOUNTER — Telehealth: Payer: Self-pay | Admitting: *Deleted

## 2016-08-14 ENCOUNTER — Other Ambulatory Visit: Payer: Self-pay | Admitting: Emergency Medicine

## 2016-08-14 DIAGNOSIS — C61 Malignant neoplasm of prostate: Secondary | ICD-10-CM

## 2016-08-14 DIAGNOSIS — R110 Nausea: Secondary | ICD-10-CM

## 2016-08-14 DIAGNOSIS — C7951 Secondary malignant neoplasm of bone: Secondary | ICD-10-CM | POA: Diagnosis not present

## 2016-08-14 DIAGNOSIS — Z5189 Encounter for other specified aftercare: Secondary | ICD-10-CM

## 2016-08-14 LAB — COMPREHENSIVE METABOLIC PANEL
ALBUMIN: 3.9 g/dL (ref 3.5–5.0)
ALK PHOS: 62 U/L (ref 40–150)
ALT: 13 U/L (ref 0–55)
AST: 11 U/L (ref 5–34)
Anion Gap: 12 mEq/L — ABNORMAL HIGH (ref 3–11)
BUN: 25.5 mg/dL (ref 7.0–26.0)
CALCIUM: 8.1 mg/dL — AB (ref 8.4–10.4)
CO2: 20 mEq/L — ABNORMAL LOW (ref 22–29)
Chloride: 108 mEq/L (ref 98–109)
Creatinine: 1 mg/dL (ref 0.7–1.3)
EGFR: 78 mL/min/{1.73_m2} — AB (ref >90)
Glucose: 106 mg/dl (ref 70–140)
POTASSIUM: 3.8 meq/L (ref 3.5–5.1)
Sodium: 139 mEq/L (ref 136–145)
Total Bilirubin: 0.92 mg/dL (ref 0.20–1.20)
Total Protein: 6.8 g/dL (ref 6.4–8.3)

## 2016-08-14 LAB — CBC WITH DIFFERENTIAL/PLATELET
BASO%: 0.6 % (ref 0.0–2.0)
BASOS ABS: 0 10*3/uL (ref 0.0–0.1)
EOS ABS: 0 10*3/uL (ref 0.0–0.5)
EOS%: 1.6 % (ref 0.0–7.0)
HEMATOCRIT: 39.9 % (ref 38.4–49.9)
HEMOGLOBIN: 13.5 g/dL (ref 13.0–17.1)
LYMPH#: 0.6 10*3/uL — AB (ref 0.9–3.3)
LYMPH%: 73.7 % — ABNORMAL HIGH (ref 14.0–49.0)
MCH: 29.4 pg (ref 27.2–33.4)
MCHC: 33.8 g/dL (ref 32.0–36.0)
MCV: 87.1 fL (ref 79.3–98.0)
MONO#: 0.1 10*3/uL (ref 0.1–0.9)
MONO%: 7.5 % (ref 0.0–14.0)
NEUT#: 0.1 10*3/uL — CL (ref 1.5–6.5)
NEUT%: 16.6 % — AB (ref 39.0–75.0)
Platelets: 149 10*3/uL (ref 140–400)
RBC: 4.58 10*6/uL (ref 4.20–5.82)
RDW: 12.7 % (ref 11.0–14.6)
WBC: 0.8 10*3/uL — CL (ref 4.0–10.3)

## 2016-08-14 MED ORDER — ONDANSETRON HCL 4 MG/2ML IJ SOLN
INTRAMUSCULAR | Status: AC
  Filled 2016-08-14: qty 4

## 2016-08-14 MED ORDER — SODIUM CHLORIDE 0.9 % IV SOLN
Freq: Once | INTRAVENOUS | Status: DC
  Administered 2016-08-14: 10:00:00 via INTRAVENOUS

## 2016-08-14 MED ORDER — SODIUM CHLORIDE 0.9 % IV SOLN: 8.0000 mg | Freq: Once | INTRAVENOUS | Status: DC

## 2016-08-14 MED ORDER — ONDANSETRON HCL 4 MG/2ML IJ SOLN
8.0000 mg | Freq: Once | INTRAMUSCULAR | Status: DC
  Administered 2016-08-14: 8 mg via INTRAVENOUS

## 2016-08-14 NOTE — Telephone Encounter (Signed)
Patient left voicemail this morning stating that he is having worsening nausea/vomiting since last Friday evening. Patient states that he has tried the antiemetic medication that was given to him with no relief. Patient would like to be advised on what he should do. Whether coming to clinic and/or new prescription sent in.

## 2016-08-14 NOTE — Patient Instructions (Signed)
Neutropenia Neutropenia is a condition that occurs when you have a lower-than-normal level of a type of white blood cell (neutrophil) in your body. Neutrophils are made in the spongy center of large bones (bone marrow) and they fight infections. Neutrophils are your body's main defense against bacterial and fungal infections. The fewer neutrophils you have and the longer your body remains without them, the greater your risk of getting a severe infection. What are the causes? This condition can occur if your body uses up or destroys neutrophils faster than your bone marrow can make them. This problem may happen because of:  Bacterial or fungal infection.  Allergic disorders.  Reactions to some medicines.  Autoimmune disease.  An enlarged spleen. This condition can also occur if your bone marrow does not produce enough neutrophils. This problem may be caused by:  Cancer.  Cancer treatments, such as radiation or chemotherapy.  Viral infections.  Medicines, such as phenytoin.  Vitamin B12 deficiency.  Diseases of the bone marrow.  Environmental toxins, such as insecticides. What are the signs or symptoms? This condition does not usually cause symptoms. If symptoms are present, they are usually caused by an underlying infection. Symptoms of an infection may include:  Fever.  Chills.  Swollen glands.  Oral or anal ulcers.  Cough and shortness of breath.  Rash.  Skin infection.  Fatigue. How is this diagnosed? Your health care provider may suspect neutropenia if you have:  A condition that may cause neutropenia.  Symptoms of infection, especially fever.  Frequent and unusual infections. You will have a medical history and physical exam. Tests will also be done, such as:  A complete blood count (CBC).  A procedure to collect a sample of bone marrow for examination (bone marrow biopsy).  A chest X-ray.  A urine culture.  A blood culture. How is this  treated? Treatment depends on the underlying cause and severity of your condition. Mild neutropenia may not require treatment. Treatment may include medicines, such as:  Antibiotic medicine given through an IV tube.  Antiviral medicines.  Antifungal medicines.  A medicine to increase neutrophil production (colony-stimulating factor). You may get this drug through an IV tube or by injection.  Steroids given through an IV tube. If an underlying condition is causing neutropenia, you may need treatment for that condition. If medicines you are taking are causing neutropenia, your health care provider may have you stop taking those medicines. Follow these instructions at home: Medicines   Take over-the-counter and prescription medicines only as told by your health care provider.  Get a seasonal flu shot (influenza vaccine). Lifestyle   Do not eat unpasteurized foods.Do not eat unwashed raw fruits or vegetables.  Avoid exposure to groups of people or children.  Avoid being around people who are sick.  Avoid being around dirt or dust, such as in construction areas or gardens.  Do not provide direct care for pets. Avoid animal droppings. Do not clean litter boxes and bird cages. Hygiene    Bathe daily.  Clean the area between the genitals and the anus (perineal area) after you urinate or have a bowel movement. If you are male, wipe from front to back.  Brush your teeth with a soft toothbrush before and after meals.  Do not use a razor that has a blade. Use an electric razor to remove hair.  Wash your hands often. Make sure others who come in contact with you also wash their hands. If soap and water are not available,  use hand sanitizer. General instructions   Do not have sex unless your health care provider has approved.  Take actions to avoid cuts and burns. For example:  Be cautious when you use knives. Always cut away from yourself.  Keep knives in protective sheaths or  guards when not in use.  Use oven mitts when you cook with a hot stove, oven, or grill.  Stand a safe distance away from open fires.  Avoid people who received a vaccine in the past 30 days if that vaccine contained a live version of the germ (live vaccine). You should not get a live vaccine. Common live vaccines are varicella, measles, mumps, and rubella.  Do not share food utensils.  Do not use tampons, enemas, or rectal suppositories unless your health care provider has approved.  Keep all appointments as told by your health care provider. This is important. Contact a health care provider if:  You have a fever.  You have chills or you start to shake.  You have:  A sore throat.  A warm, red, or tender area on your skin.  A cough.  Frequent or painful urination.  Vaginal discharge or itching.  You develop:  Sores in your mouth or anus.  Swollen lymph nodes.  Red streaks on the skin.  A rash.  You feel:  Nauseous or you vomit.  Very fatigued.  Short of breath. This information is not intended to replace advice given to you by your health care provider. Make sure you discuss any questions you have with your health care provider. Document Released: 09/01/2001 Document Revised: 08/18/2015 Document Reviewed: 09/22/2014 Elsevier Interactive Patient Education  2017 [employer] Dehydration, Adult Dehydration is a condition in which there is not enough fluid or water in the body. This happens when you lose more fluids than you take in. Important organs, such as the kidneys, brain, and heart, cannot function without a proper amount of fluids. Any loss of fluids from the body can lead to dehydration. Dehydration can range from mild to severe. This condition should be treated right away to prevent it from becoming severe. What are the causes? This condition may be caused by:  Vomiting.  Diarrhea.  Excessive sweating, such as from heat exposure or exercise.  Not  drinking enough fluid, especially:  When ill.  While doing activity that requires a lot of energy.  Excessive urination.  Fever.  Infection.  Certain medicines, such as medicines that cause the body to lose excess fluid (diuretics).  Inability to access safe drinking water.  Reduced physical ability to get adequate water and food. What increases the risk? This condition is more likely to develop in people:  Who have a poorly controlled long-term (chronic) illness, such as diabetes, heart disease, or kidney disease.  Who are age 32 or older.  Who are disabled.  Who live in a place with high altitude.  Who play endurance sports. What are the signs or symptoms? Symptoms of mild dehydration may include:   Thirst.  Dry lips.  Slightly dry mouth.  Dry, warm skin.  Dizziness. Symptoms of moderate dehydration may include:   Very dry mouth.  Muscle cramps.  Dark urine. Urine may be the color of tea.  Decreased urine production.  Decreased tear production.  Heartbeat that is irregular or faster than normal (palpitations).  Headache.  Light-headedness, especially when you stand up from a sitting position.  Fainting (syncope). Symptoms of severe dehydration may include:   Changes in skin, such as:  Cold and clammy skin.  Blotchy (mottled) or pale skin.  Skin that does not quickly return to normal after being lightly pinched and released (poor skin turgor).  Changes in body fluids, such as:  Extreme thirst.  No tear production.  Inability to sweat when body temperature is high, such as in hot weather.  Very little urine production.  Changes in vital signs, such as:  Weak pulse.  Pulse that is more than 100 beats a minute when sitting still.  Rapid breathing.  Low blood pressure.  Other changes, such as:  Sunken eyes.  Cold hands and feet.  Confusion.  Lack of energy (lethargy).  Difficulty waking up from sleep.  Short-term  weight loss.  Unconsciousness. How is this diagnosed? This condition is diagnosed based on your symptoms and a physical exam. Blood and urine tests may be done to help confirm the diagnosis. How is this treated? Treatment for this condition depends on the severity. Mild or moderate dehydration can often be treated at home. Treatment should be started right away. Do not wait until dehydration becomes severe. Severe dehydration is an emergency and it needs to be treated in a hospital. Treatment for mild dehydration may include:   Drinking more fluids.  Replacing salts and minerals in your blood (electrolytes) that you may have lost. Treatment for moderate dehydration may include:   Drinking an oral rehydration solution (ORS). This is a drink that helps you replace fluids and electrolytes (rehydrate). It can be found at pharmacies and retail stores. Treatment for severe dehydration may include:   Receiving fluids through an IV tube.  Receiving an electrolyte solution through a feeding tube that is passed through your nose and into your stomach (nasogastric tube, or NG tube).  Correcting any abnormalities in electrolytes.  Treating the underlying cause of dehydration. Follow these instructions at home:  If directed by your health care provider, drink an ORS:  Make an ORS by following instructions on the package.  Start by drinking small amounts, about  cup (120 mL) every 5-10 minutes.  Slowly increase how much you drink until you have taken the amount recommended by your health care provider.  Drink enough clear fluid to keep your urine clear or pale yellow. If you were told to drink an ORS, finish the ORS first, then start slowly drinking other clear fluids. Drink fluids such as:  Water. Do not drink only water. Doing that can lead to having too little salt (sodium) in the body (hyponatremia).  Ice chips.  Fruit juice that you have added water to (diluted fruit  juice).  Low-calorie sports drinks.  Avoid:  Alcohol.  Drinks that contain a lot of sugar. These include high-calorie sports drinks, fruit juice that is not diluted, and soda.  Caffeine.  Foods that are greasy or contain a lot of fat or sugar.  Take over-the-counter and prescription medicines only as told by your health care provider.  Do not take sodium tablets. This can lead to having too much sodium in the body (hypernatremia).  Eat foods that contain a healthy balance of electrolytes, such as bananas, oranges, potatoes, tomatoes, and spinach.  Keep all follow-up visits as told by your health care provider. This is important. Contact a health care provider if:  You have abdominal pain that:  Gets worse.  Stays in one area (localizes).  You have a rash.  You have a stiff neck.  You are more irritable than usual.  You are sleepier or more difficult to  wake up than usual.  You feel weak or dizzy.  You feel very thirsty.  You have urinated only a small amount of very dark urine over 6-8 hours. Get help right away if:  You have symptoms of severe dehydration.  You cannot drink fluids without vomiting.  Your symptoms get worse with treatment.  You have a fever.  You have a severe headache.  You have vomiting or diarrhea that:  Gets worse.  Does not go away.  You have blood or green matter (bile) in your vomit.  You have blood in your stool. This may cause stool to look black and tarry.  You have not urinated in 6-8 hours.  You faint.  Your heart rate while sitting still is over 100 beats a minute.  You have trouble breathing. This information is not intended to replace advice given to you by your health care provider. Make sure you discuss any questions you have with your health care provider. Document Released: 03/12/2005 Document Revised: 10/07/2015 Document Reviewed: 05/06/2015 Elsevier Interactive Patient Education  2017 [employer].

## 2016-08-14 NOTE — Addendum Note (Signed)
Addended by: Henreitta Leber E on: 08/14/2016 10:32 AM   Modules accepted: Orders

## 2016-08-14 NOTE — Telephone Encounter (Signed)
Patient calling to c/o nausea and vomiting since 08/10/16.  compazine and zofran not helping. Per dr [name] have patient come in for iv fluids and iv zofran.

## 2016-08-28 ENCOUNTER — Telehealth: Payer: Self-pay | Admitting: Oncology

## 2016-08-28 NOTE — Telephone Encounter (Signed)
Injection appointment time was adjusted per Injection nurse/John. Appointment confirmed with patient.

## 2016-08-29 ENCOUNTER — Ambulatory Visit (HOSPITAL_BASED_OUTPATIENT_CLINIC_OR_DEPARTMENT_OTHER): Payer: Medicare Other | Admitting: Oncology

## 2016-08-29 ENCOUNTER — Ambulatory Visit: Payer: Medicare Other | Admitting: Nutrition

## 2016-08-29 ENCOUNTER — Other Ambulatory Visit (HOSPITAL_BASED_OUTPATIENT_CLINIC_OR_DEPARTMENT_OTHER): Payer: Medicare Other

## 2016-08-29 ENCOUNTER — Ambulatory Visit (HOSPITAL_BASED_OUTPATIENT_CLINIC_OR_DEPARTMENT_OTHER): Payer: Medicare Other

## 2016-08-29 ENCOUNTER — Telehealth: Payer: Self-pay | Admitting: Oncology

## 2016-08-29 VITALS — BP 157/78 | HR 59 | Temp 98.0°F | Resp 18 | Ht 71.0 in | Wt 167.8 lb

## 2016-08-29 DIAGNOSIS — C61 Malignant neoplasm of prostate: Secondary | ICD-10-CM

## 2016-08-29 DIAGNOSIS — C7951 Secondary malignant neoplasm of bone: Secondary | ICD-10-CM | POA: Diagnosis not present

## 2016-08-29 DIAGNOSIS — Z5111 Encounter for antineoplastic chemotherapy: Secondary | ICD-10-CM | POA: Diagnosis present

## 2016-08-29 DIAGNOSIS — R112 Nausea with vomiting, unspecified: Secondary | ICD-10-CM | POA: Diagnosis not present

## 2016-08-29 LAB — CBC WITH DIFFERENTIAL/PLATELET
BASO%: 0.4 % (ref 0.0–2.0)
Basophils Absolute: 0 10*3/uL (ref 0.0–0.1)
EOS%: 0.2 % (ref 0.0–7.0)
Eosinophils Absolute: 0 10*3/uL (ref 0.0–0.5)
HCT: 36.9 % — ABNORMAL LOW (ref 38.4–49.9)
HGB: 12.5 g/dL — ABNORMAL LOW (ref 13.0–17.1)
LYMPH#: 1.4 10*3/uL (ref 0.9–3.3)
LYMPH%: 26 % (ref 14.0–49.0)
MCH: 29.9 pg (ref 27.2–33.4)
MCHC: 33.8 g/dL (ref 32.0–36.0)
MCV: 88.5 fL (ref 79.3–98.0)
MONO#: 0.5 10*3/uL (ref 0.1–0.9)
MONO%: 10.2 % (ref 0.0–14.0)
NEUT#: 3.4 10*3/uL (ref 1.5–6.5)
NEUT%: 63.2 % (ref 39.0–75.0)
Platelets: 312 10*3/uL (ref 140–400)
RBC: 4.16 10*6/uL — ABNORMAL LOW (ref 4.20–5.82)
RDW: 13.8 % (ref 11.0–14.6)
WBC: 5.4 10*3/uL (ref 4.0–10.3)

## 2016-08-29 LAB — COMPREHENSIVE METABOLIC PANEL
ALBUMIN: 4 g/dL (ref 3.5–5.0)
ALK PHOS: 63 U/L (ref 40–150)
ALT: 6 U/L (ref 0–55)
AST: 8 U/L (ref 5–34)
Anion Gap: 6 mEq/L (ref 3–11)
BUN: 16.5 mg/dL (ref 7.0–26.0)
CALCIUM: 9.6 mg/dL (ref 8.4–10.4)
CHLORIDE: 114 meq/L — AB (ref 98–109)
CO2: 24 mEq/L (ref 22–29)
Creatinine: 1.1 mg/dL (ref 0.7–1.3)
EGFR: 68 mL/min/{1.73_m2} — ABNORMAL LOW (ref >90)
GLUCOSE: 77 mg/dL (ref 70–140)
POTASSIUM: 4.4 meq/L (ref 3.5–5.1)
SODIUM: 144 meq/L (ref 136–145)
Total Bilirubin: 0.35 mg/dL (ref 0.20–1.20)
Total Protein: 6.9 g/dL (ref 6.4–8.3)

## 2016-08-29 MED ORDER — FAMOTIDINE IN NACL 20-0.9 MG/50ML-% IV SOLN
20.0000 mg | Freq: Once | INTRAVENOUS | Status: AC
  Administered 2016-08-29: 20 mg via INTRAVENOUS

## 2016-08-29 MED ORDER — FAMOTIDINE IN NACL 20-0.9 MG/50ML-% IV SOLN
INTRAVENOUS | Status: AC
  Filled 2016-08-29: qty 50

## 2016-08-29 MED ORDER — DIPHENHYDRAMINE HCL 50 MG/ML IJ SOLN
INTRAMUSCULAR | Status: AC
  Filled 2016-08-29: qty 1

## 2016-08-29 MED ORDER — SODIUM CHLORIDE 0.9 % IV SOLN
Freq: Once | INTRAVENOUS | Status: AC
  Administered 2016-08-29: 12:00:00 via INTRAVENOUS
  Filled 2016-08-29: qty 5

## 2016-08-29 MED ORDER — PALONOSETRON HCL INJECTION 0.25 MG/5ML
0.2500 mg | Freq: Once | INTRAVENOUS | Status: AC
  Administered 2016-08-29: 0.25 mg via INTRAVENOUS

## 2016-08-29 MED ORDER — SODIUM CHLORIDE 0.9 % IV SOLN
Freq: Once | INTRAVENOUS | Status: AC
Start: 2016-08-29 — End: 2016-08-29
  Administered 2016-08-29: 11:00:00 via INTRAVENOUS

## 2016-08-29 MED ORDER — DIPHENHYDRAMINE HCL 50 MG/ML IJ SOLN
25.0000 mg | Freq: Once | INTRAMUSCULAR | Status: AC
  Administered 2016-08-29: 25 mg via INTRAVENOUS

## 2016-08-29 MED ORDER — PALONOSETRON HCL INJECTION 0.25 MG/5ML
INTRAVENOUS | Status: AC
  Filled 2016-08-29: qty 5

## 2016-08-29 MED ORDER — CABAZITAXEL CHEMO INJECTION 60 MG/6ML W/DILUENT
20.0000 mg/m2 | Freq: Once | INTRAVENOUS | Status: AC
  Administered 2016-08-29: 39 mg via INTRAVENOUS
  Filled 2016-08-29: qty 3.9

## 2016-08-29 NOTE — Progress Notes (Signed)
Hematology and Oncology Follow Up Visit  [name] [id number] [birth year] 71 y.o. 08/29/2016 10:53 AM   Principle Diagnosis: 72 year old gentleman with prostate cancer diagnosed in October 2015. He had a Gleason score 4+5 = 9 and a PSA of 116. Abdominal imaging showed bulky retroperitoneal lymphadenopathy.   Prior Therapy:  Status post prostate biopsy on 01/19/2014. Systemic chemotherapy in the form of Taxotere 75 mg/m every 3 weeks with plans for total of 6 cycles. Cycle 1 given on 05/05/2014. He is status post 6 cycles of therapy concluded on 08/20/2014. Xtandi 160 mg daily started on 07/25/2015. Therapy discontinued in May 2018 because of progression of disease.   Current therapy:  Androgen deprivation under the care of Dr. [name]. He receives Lupron every 6 months.  Xgeva monthly injections started11/11/2015.  Jevtana chemotherapy started on 08/08/2016. He received 25 mg/m cycle 1. He will receive 20 mg/m in subsequent cycles.  Interim History:  [name] presents today for a follow-up visit with his wife. Since the last visit, he received the first cycle of chemotherapy and tolerated it poorly. He reported nausea and vomiting that required intravenous hydration and antiemetics but no hospitalization. Despite these episodes, he reports his appetite have actually improved in the last 2 weeks. He is able to eat better and have gained more weight. He denied any other complications related to chemotherapy such as peripheral neuropathy or infusion related complications.  He does not report any headaches, blurry vision or syncope. He does not report any seizures or any neurological symptoms. He does not report any fevers, chills, weight loss or decline his energy. He does not report any chest pain, palpitation, orthopnea or leg edema. He does not report any cough, hemoptysis or hematemesis. He does not report any nausea, vomiting, abdominal pain, constipation or diarrhea.  Remainder of his  review of systems unremarkable.   Medications: I have reviewed the patient's current medications.  Current Outpatient Prescriptions  Medication Sig Dispense Refill  . abiraterone Acetate (ZYTIGA) 250 MG tablet Take 4 tablets (1,000 mg total) by mouth daily. Take on an empty stomach 1 hour before or 2 hours after a meal (Patient not taking: Reported on 07/26/2016) 120 tablet 0  . aspirin 81 MG tablet Take 81 mg by mouth every morning.     . calcium-vitamin D (OSCAL WITH D) 500-200 MG-UNIT tablet Take 1 tablet by mouth 2 (two) times daily. (Patient not taking: Reported on 07/26/2016) 60 tablet 3  . diphenhydramine-acetaminophen (TYLENOL PM) 25-500 MG TABS tablet Take 2 tablets by mouth at bedtime as needed.    . ondansetron (ZOFRAN) 4 MG tablet Take 1 tablet (4 mg total) by mouth every 8 (eight) hours as needed for nausea or vomiting. 40 tablet 1  . predniSONE (DELTASONE) 5 MG tablet Take 1 tablet (5 mg total) by mouth daily with breakfast. (Patient not taking: Reported on 07/26/2016) 30 tablet 3  . prochlorperazine (COMPAZINE) 10 MG tablet Take 1 tablet (10 mg total) by mouth every 6 (six) hours as needed for nausea or vomiting. 30 tablet 1  . pseudoephedrine (SUDAFED) 30 MG tablet Take 30 mg by mouth as directed.     No current facility-administered medications for this visit.    Facility-Administered Medications Ordered in Other Visits  Medication Dose Route Frequency Provider Last Rate Last Dose  . denosumab (XGEVA) injection 120 mg  120 mg Subcutaneous Once Shadad, [name], MD         Allergies: No Known Allergies  Past Medical History,  Surgical history, Social history, and Family History were reviewed and updated.   Physical Exam: Blood pressure (!) 157/78, pulse (!) 59, temperature 98 F (36.7 C), temperature source Oral, resp. rate 18, height 5\' 11"  (1.803 m), weight 167 lb 12.8 oz (76.1 kg), SpO2 100 %. ECOG: 1 General appearance: Alert, awake gentleman appeared without any distress  today. Head: Normocephalic, without obvious abnormality no oral ulcers or lesions. Neck: no adenopathy no thyroid masses. Lymph nodes: Cervical, supraclavicular, and axillary nodes normal. Heart:regular rate and rhythm, S1, S2 normal, no murmur, click, rub or gallop Lung:chest clear, no wheezing, rales, normal symmetric air entry no dullness to percussion. Abdomin: soft, non-tender, without masses or organomegaly no rebound or guarding. EXT:no erythema, induration, or nodules   Lab Results: Lab Results  Component Value Date   WBC 5.4 08/29/2016   HGB 12.5 (L) 08/29/2016   HCT 36.9 (L) 08/29/2016   MCV 88.5 08/29/2016   PLT 312 08/29/2016     Chemistry      Component Value Date/Time   NA 144 08/29/2016 1005   K 4.4 08/29/2016 1005   CL 108 10/04/2014 0749   CO2 24 08/29/2016 1005   BUN 16.5 08/29/2016 1005   CREATININE 1.1 08/29/2016 1005      Component Value Date/Time   CALCIUM 9.6 08/29/2016 1005   ALKPHOS 63 08/29/2016 1005   AST 8 08/29/2016 1005   ALT 6 08/29/2016 1005   BILITOT 0.35 08/29/2016 1005        Results for [name], KEISHAUN [name] (MRN [id number]) as of 08/29/2016 10:53  Ref. Range 06/27/2016 11:46 07/26/2016 09:14 08/08/2016 08:00  PSA Latest Ref Range: 0.0 - 4.0 ng/mL 14.1 (H) 22.8 (H) 28.8 (H)    Impression and Plan:  71 year old gentleman with the following issues:  1. Advanced prostate cancer presenting with stage IV disease. His Gleason score is 4+5 = 9 with heavy volume disease involving 11 out of 12 cores of his biopsy obtained in October 2015. His PSA is 116 and his staging workup revealed lymphadenopathy in the periaortic and iliac chains. He started androgen depravation under the care of Dr. [name].  He is status post 6 cycles of chemotherapy and currently receiving androgen deprivation.    PSA increased up to 5.4 and staging workup on 07/19/2015 with bone scan is indicating bony metastasis including activity in the sixth rib and the inferior left ilium.  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  He started on Xtandi on 07/25/2015 and have tolerated it well.   CT scan and bone scan obtained on 04/10/2016 showed some mild progression of his disease predominantly in the bone. No visceral metastasis noted.  His PSA on 08/08/2016 was 28.8 with a rapid rise and a doubling time of 6 weeks.  He is currently receiving Jevtana and have tolerated the first cycle poorly. The plan is to proceed with cycle 2 today with a dose reduction at 20 mg/m for better tolerance. The plan is to proceed with total of 10 cycles if he can tolerate it.   2. Anorexia: Appears to have improved at this time which could be related to stopping the [facility] as well as better cancer control.  3. Androgen depravation: He has been receiving Lupron at [facility] and we will switch his injections to the [place] in the future. His last Lupron was in February 2018 and will receive his next Lupron in July 2018.  4. Bone directed therapy: Delton See  will be on hold for the time being until his symptoms improve.  5. IV access: Peripheral veins will be used for the time being. Port-A-Cath can be reinserted in the future.  6. Neutropenia prophylaxis: He will receive Neulasta after each chemotherapy treatment.  7. Nausea and vomiting: Related to chemotherapy. Dose reduction of Jevtana will help. We will also adjust his antiemetics to include Aloxi and Emend with his chemotherapy.  8. Follow-up: Will be weekly for IV hydration and antiemetics as needed and in 3 weeks for his next cycle of chemotherapy.   SHADAD,FIRAS,MD 6/6/201810:53 AM

## 2016-08-29 NOTE — Telephone Encounter (Signed)
Gave patient avs report and appointments for June and July.  °

## 2016-08-29 NOTE — Patient Instructions (Signed)
[facility] Discharge Instructions for Patients Receiving Chemotherapy  Today you received the following chemotherapy agents Jevtana.  To help prevent nausea and vomiting after your treatment, we encourage you to take your nausea medication as directed.  If you develop nausea and vomiting that is not controlled by your nausea medication, call the clinic.   BELOW ARE SYMPTOMS THAT SHOULD BE REPORTED IMMEDIATELY:  *FEVER GREATER THAN 100.5 F  *CHILLS WITH OR WITHOUT FEVER  NAUSEA AND VOMITING THAT IS NOT CONTROLLED WITH YOUR NAUSEA MEDICATION  *UNUSUAL SHORTNESS OF BREATH  *UNUSUAL BRUISING OR BLEEDING  TENDERNESS IN MOUTH AND THROAT WITH OR WITHOUT PRESENCE OF ULCERS  *URINARY PROBLEMS  *BOWEL PROBLEMS  UNUSUAL RASH Items with * indicate a potential emergency and should be followed up as soon as possible.  Feel free to call the clinic you have any questions or concerns. The clinic phone number is [phone].  Please show the CHEMO ALERT CARD at check-in to the Emergency Department and triage nurse.    

## 2016-08-29 NOTE — Progress Notes (Signed)
Patient was identified to be at risk for malnutrition on the MST secondary to poor appetite and weight loss.  71 year old male diagnosed with prostate cancer receiving treatment.  He is a patient of Dr. [name].  Past medical history includes diverticulosis.  Medications include calcium, vitamin D, Zofran, prednisone and Compazine.  Labs include albumin 3.9 on May 22.  Height: 5 feet 11 inches. Weight: 167.8 pounds on June 6. Usual body weight: 182 pounds. BMI: 23.4.  Patient endorses poor appetite and weight loss as well as nausea and vomiting. These have now improved and patient is eating better. He is consuming 3 meals a day and will occasionally snack.  Nutrition diagnosis:  Unintended weight loss related to prostate cancer and associated treatments as evidenced by 8 percent weight loss over 10 months.  Intervention: Patient was educated to consume small frequent meals and snacks with high-calorie, high-protein foods. Reviewed fact sheet with patient. Educated patient on strategies for improving nausea and vomiting. Provided fact sheets.  Questions were answered.  Teach back method used.  Contact information given.  Monitoring, evaluation, goals: Patient will work to increase calories and protein to minimize weight loss.  Next visit: Wednesday, June 27, during infusion.  **Disclaimer: This note was dictated with voice recognition software. Similar sounding words can inadvertently be transcribed and this note may contain transcription errors which may not have been corrected upon publication of note.**

## 2016-08-30 ENCOUNTER — Telehealth: Payer: Self-pay | Admitting: *Deleted

## 2016-08-30 ENCOUNTER — Ambulatory Visit (HOSPITAL_BASED_OUTPATIENT_CLINIC_OR_DEPARTMENT_OTHER): Payer: Medicare Other

## 2016-08-30 VITALS — BP 114/71 | HR 77 | Temp 97.0°F | Resp 20

## 2016-08-30 DIAGNOSIS — C61 Malignant neoplasm of prostate: Secondary | ICD-10-CM

## 2016-08-30 DIAGNOSIS — Z5189 Encounter for other specified aftercare: Secondary | ICD-10-CM

## 2016-08-30 LAB — PSA: Prostate Specific Ag, Serum: 33.6 ng/mL — ABNORMAL HIGH (ref 0.0–4.0)

## 2016-08-30 MED ORDER — PEGFILGRASTIM INJECTION 6 MG/0.6ML ~~LOC~~
6.0000 mg | PREFILLED_SYRINGE | Freq: Once | SUBCUTANEOUS | Status: AC
  Administered 2016-08-30: 6 mg via SUBCUTANEOUS
  Filled 2016-08-30: qty 0.6

## 2016-08-30 NOTE — Telephone Encounter (Signed)
-----   Message from Wyatt Portela, MD sent at 08/30/2016  7:48 AM EDT ----- Please let him know his PSA slightly up. No changes for now.

## 2016-08-30 NOTE — Telephone Encounter (Signed)
Spoke with patient. Gave last PSA results

## 2016-08-30 NOTE — Patient Instructions (Signed)
Pegfilgrastim injection What is this medicine? PEGFILGRASTIM (PEG fil gra stim) is a long-acting granulocyte colony-stimulating factor that stimulates the growth of neutrophils, a type of white blood cell important in the body's fight against infection. It is used to reduce the incidence of fever and infection in patients with certain types of cancer who are receiving chemotherapy that affects the bone marrow, and to increase survival after being exposed to high doses of radiation. This medicine may be used for other purposes; ask your health care provider or pharmacist if you have questions. COMMON BRAND NAME(S): Neulasta What should I tell my health care provider before I take this medicine? They need to know if you have any of these conditions: -kidney disease -latex allergy -ongoing radiation therapy -sickle cell disease -skin reactions to acrylic adhesives (On-Body Injector only) -an unusual or allergic reaction to pegfilgrastim, filgrastim, other medicines, foods, dyes, or preservatives -pregnant or trying to get pregnant -breast-feeding How should I use this medicine? This medicine is for injection under the skin. If you get this medicine at home, you will be taught how to prepare and give the pre-filled syringe or how to use the On-body Injector. Refer to the patient Instructions for Use for detailed instructions. Use exactly as directed. Tell your healthcare provider immediately if you suspect that the On-body Injector may not have performed as intended or if you suspect the use of the On-body Injector resulted in a missed or partial dose. It is important that you put your used needles and syringes in a special sharps container. Do not put them in a trash can. If you do not have a sharps container, call your pharmacist or healthcare provider to get one. Talk to your pediatrician regarding the use of this medicine in children. While this drug may be prescribed for selected conditions,  precautions do apply. Overdosage: If you think you have taken too much of this medicine contact a poison control center or emergency room at once. NOTE: This medicine is only for you. Do not share this medicine with others. What if I miss a dose? It is important not to miss your dose. Call your doctor or health care professional if you miss your dose. If you miss a dose due to an On-body Injector failure or leakage, a new dose should be administered as soon as possible using a single prefilled syringe for manual use. What may interact with this medicine? Interactions have not been studied. Give your health care provider a list of all the medicines, herbs, non-prescription drugs, or dietary supplements you use. Also tell them if you smoke, drink alcohol, or use illegal drugs. Some items may interact with your medicine. This list may not describe all possible interactions. Give your health care provider a list of all the medicines, herbs, non-prescription drugs, or dietary supplements you use. Also tell them if you smoke, drink alcohol, or use illegal drugs. Some items may interact with your medicine. What should I watch for while using this medicine? You may need blood work done while you are taking this medicine. If you are going to need a MRI, CT scan, or other procedure, tell your doctor that you are using this medicine (On-Body Injector only). What side effects may I notice from receiving this medicine? Side effects that you should report to your doctor or health care professional as soon as possible: -allergic reactions like skin rash, itching or hives, swelling of the face, lips, or tongue -dizziness -fever -pain, redness, or irritation at site   where injected -pinpoint red spots on the skin -red or dark-brown urine -shortness of breath or breathing problems -stomach or side pain, or pain at the shoulder -swelling -tiredness -trouble passing urine or change in the amount of urine Side  effects that usually do not require medical attention (report to your doctor or health care professional if they continue or are bothersome): -bone pain -muscle pain This list may not describe all possible side effects. Call your doctor for medical advice about side effects. You may report side effects to FDA at 1-800-FDA-1088. Where should I keep my medicine? Keep out of the reach of children. Store pre-filled syringes in a refrigerator between 2 and 8 degrees C (36 and 46 degrees F). Do not freeze. Keep in carton to protect from light. Throw away this medicine if it is left out of the refrigerator for more than 48 hours. Throw away any unused medicine after the expiration date. NOTE: This sheet is a summary. It may not cover all possible information. If you have questions about this medicine, talk to your doctor, pharmacist, or health care provider.  2018 Elsevier/Gold Standard (2016-03-08 12:58:03)  

## 2016-09-03 ENCOUNTER — Telehealth: Payer: Self-pay | Admitting: *Deleted

## 2016-09-03 NOTE — Telephone Encounter (Signed)
"  I am doing well. I do not need to come in this week for IVF."  Denies N/V, diarrhea or any problems with appetite.  Will notify provider.

## 2016-09-05 ENCOUNTER — Ambulatory Visit: Payer: Medicare Other

## 2016-09-10 ENCOUNTER — Telehealth: Payer: Self-pay

## 2016-09-10 NOTE — Telephone Encounter (Signed)
Pt called to cancel his Wednesday appt for fluids. He has tolerated chemotherapy this weekend with no sickness. appt cancelled.   Next [facility] 6/27

## 2016-09-12 ENCOUNTER — Ambulatory Visit: Payer: Medicare Other

## 2016-09-19 ENCOUNTER — Telehealth: Payer: Self-pay | Admitting: Oncology

## 2016-09-19 ENCOUNTER — Other Ambulatory Visit (HOSPITAL_BASED_OUTPATIENT_CLINIC_OR_DEPARTMENT_OTHER): Payer: Medicare Other

## 2016-09-19 ENCOUNTER — Ambulatory Visit (HOSPITAL_BASED_OUTPATIENT_CLINIC_OR_DEPARTMENT_OTHER): Payer: Medicare Other | Admitting: Oncology

## 2016-09-19 ENCOUNTER — Ambulatory Visit: Payer: Medicare Other | Admitting: Nutrition

## 2016-09-19 ENCOUNTER — Ambulatory Visit (HOSPITAL_BASED_OUTPATIENT_CLINIC_OR_DEPARTMENT_OTHER): Payer: Medicare Other

## 2016-09-19 VITALS — BP 145/64 | HR 72 | Temp 97.9°F | Resp 18 | Ht 71.0 in | Wt 168.7 lb

## 2016-09-19 DIAGNOSIS — C61 Malignant neoplasm of prostate: Secondary | ICD-10-CM

## 2016-09-19 DIAGNOSIS — Z5111 Encounter for antineoplastic chemotherapy: Secondary | ICD-10-CM

## 2016-09-19 DIAGNOSIS — E291 Testicular hypofunction: Secondary | ICD-10-CM | POA: Diagnosis not present

## 2016-09-19 DIAGNOSIS — R630 Anorexia: Secondary | ICD-10-CM

## 2016-09-19 LAB — COMPREHENSIVE METABOLIC PANEL
ALBUMIN: 4 g/dL (ref 3.5–5.0)
ALK PHOS: 60 U/L (ref 40–150)
ALT: 6 U/L (ref 0–55)
AST: 8 U/L (ref 5–34)
Anion Gap: 10 mEq/L (ref 3–11)
BILIRUBIN TOTAL: 0.42 mg/dL (ref 0.20–1.20)
BUN: 15.7 mg/dL (ref 7.0–26.0)
CALCIUM: 9.3 mg/dL (ref 8.4–10.4)
CO2: 21 mEq/L — ABNORMAL LOW (ref 22–29)
CREATININE: 1 mg/dL (ref 0.7–1.3)
Chloride: 114 mEq/L — ABNORMAL HIGH (ref 98–109)
EGFR: 73 mL/min/{1.73_m2} — ABNORMAL LOW (ref >90)
GLUCOSE: 99 mg/dL (ref 70–140)
Potassium: 4.4 mEq/L (ref 3.5–5.1)
SODIUM: 145 meq/L (ref 136–145)
Total Protein: 6.8 g/dL (ref 6.4–8.3)

## 2016-09-19 LAB — CBC WITH DIFFERENTIAL/PLATELET
BASO%: 0.5 % (ref 0.0–2.0)
BASOS ABS: 0 10*3/uL (ref 0.0–0.1)
EOS%: 0.6 % (ref 0.0–7.0)
Eosinophils Absolute: 0 10*3/uL (ref 0.0–0.5)
HEMATOCRIT: 37.5 % — AB (ref 38.4–49.9)
HEMOGLOBIN: 12.5 g/dL — AB (ref 13.0–17.1)
LYMPH#: 1.3 10*3/uL (ref 0.9–3.3)
LYMPH%: 22.6 % (ref 14.0–49.0)
MCH: 29.6 pg (ref 27.2–33.4)
MCHC: 33.3 g/dL (ref 32.0–36.0)
MCV: 88.9 fL (ref 79.3–98.0)
MONO#: 0.4 10*3/uL (ref 0.1–0.9)
MONO%: 7.1 % (ref 0.0–14.0)
NEUT#: 3.9 10*3/uL (ref 1.5–6.5)
NEUT%: 69.2 % (ref 39.0–75.0)
Platelets: 234 10*3/uL (ref 140–400)
RBC: 4.22 10*6/uL (ref 4.20–5.82)
RDW: 14.7 % — AB (ref 11.0–14.6)
WBC: 5.7 10*3/uL (ref 4.0–10.3)

## 2016-09-19 MED ORDER — PALONOSETRON HCL INJECTION 0.25 MG/5ML
INTRAVENOUS | Status: AC
  Filled 2016-09-19: qty 5

## 2016-09-19 MED ORDER — PALONOSETRON HCL INJECTION 0.25 MG/5ML
0.2500 mg | Freq: Once | INTRAVENOUS | Status: AC
  Administered 2016-09-19: 0.25 mg via INTRAVENOUS

## 2016-09-19 MED ORDER — FAMOTIDINE IN NACL 20-0.9 MG/50ML-% IV SOLN
20.0000 mg | Freq: Once | INTRAVENOUS | Status: AC
  Administered 2016-09-19: 20 mg via INTRAVENOUS

## 2016-09-19 MED ORDER — FOSAPREPITANT DIMEGLUMINE INJECTION 150 MG
Freq: Once | INTRAVENOUS | Status: AC
  Administered 2016-09-19: 12:00:00 via INTRAVENOUS
  Filled 2016-09-19: qty 5

## 2016-09-19 MED ORDER — CABAZITAXEL CHEMO INJECTION 60 MG/6ML W/DILUENT
20.0000 mg/m2 | Freq: Once | INTRAVENOUS | Status: AC
  Administered 2016-09-19: 39 mg via INTRAVENOUS
  Filled 2016-09-19: qty 3.9

## 2016-09-19 MED ORDER — FAMOTIDINE IN NACL 20-0.9 MG/50ML-% IV SOLN
INTRAVENOUS | Status: AC
  Filled 2016-09-19: qty 50

## 2016-09-19 MED ORDER — SODIUM CHLORIDE 0.9 % IV SOLN
Freq: Once | INTRAVENOUS | Status: AC
  Administered 2016-09-19: 11:00:00 via INTRAVENOUS

## 2016-09-19 MED ORDER — DIPHENHYDRAMINE HCL 50 MG/ML IJ SOLN
25.0000 mg | Freq: Once | INTRAMUSCULAR | Status: AC
  Administered 2016-09-19: 25 mg via INTRAVENOUS

## 2016-09-19 MED ORDER — DIPHENHYDRAMINE HCL 50 MG/ML IJ SOLN
INTRAMUSCULAR | Status: AC
  Filled 2016-09-19: qty 1

## 2016-09-19 NOTE — Patient Instructions (Signed)
[facility] Discharge Instructions for Patients Receiving Chemotherapy  Today you received the following chemotherapy agents Jevtana.  To help prevent nausea and vomiting after your treatment, we encourage you to take your nausea medication as directed.  If you develop nausea and vomiting that is not controlled by your nausea medication, call the clinic.   BELOW ARE SYMPTOMS THAT SHOULD BE REPORTED IMMEDIATELY:  *FEVER GREATER THAN 100.5 F  *CHILLS WITH OR WITHOUT FEVER  NAUSEA AND VOMITING THAT IS NOT CONTROLLED WITH YOUR NAUSEA MEDICATION  *UNUSUAL SHORTNESS OF BREATH  *UNUSUAL BRUISING OR BLEEDING  TENDERNESS IN MOUTH AND THROAT WITH OR WITHOUT PRESENCE OF ULCERS  *URINARY PROBLEMS  *BOWEL PROBLEMS  UNUSUAL RASH Items with * indicate a potential emergency and should be followed up as soon as possible.  Feel free to call the clinic you have any questions or concerns. The clinic phone number is [phone].  Please show the CHEMO ALERT CARD at check-in to the Emergency Department and triage nurse.    

## 2016-09-19 NOTE — Telephone Encounter (Signed)
Scheduled appt per 6/27 los and treatment plan - gave patient AVS and calender.

## 2016-09-19 NOTE — Addendum Note (Signed)
Addended by: Randolm Idol on: 09/19/2016 10:38 AM   Modules accepted: Orders

## 2016-09-19 NOTE — Progress Notes (Signed)
Hematology and Oncology Follow Up Visit  [name] [id number] 09-06-1945 71 y.o. 09/19/2016 10:11 AM   Principle Diagnosis: 71 year old gentleman with prostate cancer diagnosed in October 2015. He had a Gleason score 4+5 = 9 and a PSA of 116. Abdominal imaging showed bulky retroperitoneal lymphadenopathy.   Prior Therapy:  Status post prostate biopsy on 01/19/2014. Systemic chemotherapy in the form of Taxotere 75 mg/m every 3 weeks with plans for total of 6 cycles. Cycle 1 given on 05/05/2014. He is status post 6 cycles of therapy concluded on 08/20/2014. Xtandi 160 mg daily started on 07/25/2015. Therapy discontinued in May 2018 because of progression of disease.   Current therapy:  Androgen deprivation under the care of Dr. [name]. He receives Lupron every 6 months.  Xgeva monthly injections started11/11/2015.  Jevtana chemotherapy started on 08/08/2016. He received 25 mg/m cycle 1. He will receive 20 mg/m in subsequent cycles. He is here for cycle 3.  Interim History:  [name] presents today for a follow-up visit with his wife. Since the last visit, he tolerated the second cycle of chemotherapy much better than the first one. He did not report issues with the last including increased arthralgias and myalgias and increased fatigue. Despite these episodes, he remains active and continues to attend activities of daily living. He denied any diarrhea or excessive fatigue. 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 His exercise tolerance have decreased however since the start of chemotherapy. In his antiemetic regimen after the second cycle of also improved his tolerance to chemotherapy at this time.  He does not report any headaches, blurry vision or syncope. He does not report any seizures or any neurological symptoms. He does not report any fevers, chills, weight loss or decline his energy. He does not report any chest pain, palpitation, orthopnea or leg edema. He  does not report any cough, hemoptysis or hematemesis. He does not report any nausea, vomiting, abdominal pain, constipation or diarrhea.  Remainder of his review of systems unremarkable.   Medications: I have reviewed the patient's current medications.  Current Outpatient Prescriptions  Medication Sig Dispense Refill  . abiraterone Acetate (ZYTIGA) 250 MG tablet Take 4 tablets (1,000 mg total) by mouth daily. Take on an empty stomach 1 hour before or 2 hours after a meal (Patient not taking: Reported on 07/26/2016) 120 tablet 0  . aspirin 81 MG tablet Take 81 mg by mouth every morning.     . calcium-vitamin D (OSCAL WITH D) 500-200 MG-UNIT tablet Take 1 tablet by mouth 2 (two) times daily. (Patient not taking: Reported on 07/26/2016) 60 tablet 3  . diphenhydramine-acetaminophen (TYLENOL PM) 25-500 MG TABS tablet Take 2 tablets by mouth at bedtime as needed.    . ondansetron (ZOFRAN) 4 MG tablet Take 1 tablet (4 mg total) by mouth every 8 (eight) hours as needed for nausea or vomiting. 40 tablet 1  . predniSONE (DELTASONE) 5 MG tablet Take 1 tablet (5 mg total) by mouth daily with breakfast. (Patient not taking: Reported on 07/26/2016) 30 tablet 3  . prochlorperazine (COMPAZINE) 10 MG tablet Take 1 tablet (10 mg total) by mouth every 6 (six) hours as needed for nausea or vomiting. 30 tablet 1  . pseudoephedrine (SUDAFED) 30 MG tablet Take 30 mg by mouth as directed.     No current facility-administered medications for this visit.    Facility-Administered Medications Ordered in Other Visits  Medication Dose Route Frequency Provider Last Rate Last Dose  .  denosumab (XGEVA) injection 120 mg  120 mg Subcutaneous Once [name], [name], MD         Allergies: No Known Allergies  Past Medical History, Surgical history, Social history, and Family History were reviewed and updated.   Physical Exam: Blood pressure (!) 145/64, pulse 72, temperature 97.9 F (36.6 C), temperature source Oral, resp. rate 18,  height 5\' 11"  (1.803 m), weight 168 lb 11.2 oz (76.5 kg), SpO2 100 %. ECOG: 1 General appearance: Alert, awake gentleman without distress. Head: Normocephalic, without obvious abnormality no oral ulcers or lesions. Neck: no adenopathy no thyroid masses. Lymph nodes: Cervical, supraclavicular, and axillary nodes normal. Heart:regular rate and rhythm, S1, S2 normal, no murmur, click, rub or gallop Lung:chest clear, no wheezing, rales, normal symmetric air entry no dullness to percussion. Abdomin: soft, non-tender, without masses or organomegaly no shifting dullness or ascites. EXT:no erythema, induration, or nodules   Lab Results: Lab Results  Component Value Date   WBC 5.7 09/19/2016   HGB 12.5 (L) 09/19/2016   HCT 37.5 (L) 09/19/2016   MCV 88.9 09/19/2016   PLT 234 09/19/2016     Chemistry      Component Value Date/Time   NA 144 08/29/2016 1005   K 4.4 08/29/2016 1005   CL 108 10/04/2014 0749   CO2 24 08/29/2016 1005   BUN 16.5 08/29/2016 1005   CREATININE 1.1 08/29/2016 1005      Component Value Date/Time   CALCIUM 9.6 08/29/2016 1005   ALKPHOS 63 08/29/2016 1005   AST 8 08/29/2016 1005   ALT 6 08/29/2016 1005   BILITOT 0.35 08/29/2016 1005      Results for [name], [name] (MRN [id number]) as of 09/19/2016 09:42  Ref. Range 07/26/2016 09:14 08/08/2016 08:00 08/29/2016 10:05  PSA Latest Ref Range: 0.0 - 4.0 ng/mL 22.8 (H) 28.8 (H) 33.6 (H)     Impression and Plan:  71 year old gentleman with the following issues:  1. Advanced prostate cancer presenting with stage IV disease. His Gleason score is 4+5 = 9 with heavy volume disease involving 11 out of 12 cores of his biopsy obtained in October 2015. His PSA is 116 and his staging workup revealed lymphadenopathy in the periaortic and iliac chains. He started androgen depravation under the care of Dr. [name].  He is status post 6 cycles of chemotherapy and currently receiving androgen deprivation.    PSA increased up to 5.4  and staging workup on 07/19/2015 with bone scan is indicating bony metastasis including activity in the sixth rib and the inferior left ilium. 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  He started on Xtandi on 07/25/2015 and have tolerated it well.   CT scan and bone scan obtained on 04/10/2016 showed some mild progression of his disease predominantly in the bone. No visceral metastasis noted.  His PSA on 08/08/2016 was 28.8 with a rapid rise and a doubling time of 6 weeks.  He is currently receiving Jevtana have tolerated the second cycle better with dose reduction. The plan is to continue with the same dose and schedule with the same antiemetic regimen. His PSA did go up slightly but we will continue on the current regimen.  2. Anorexia: Appears to have improved at this time which could be related to stopping the Xtandi. Weight is stable.  3. Androgen depravation: He has been receiving Lupron at [facility] and we will switch his injections to the [facility] in the future. His last  Lupron was in February 2018 and will receive his next Lupron in July 2018.  4. Bone directed therapy: Delton See will be on hold for the time being until his symptoms improve.  5. IV access: Peripheral veins will be used for the time being. Port-A-Cath can be reinserted in the future.  6. Neutropenia prophylaxis: Neulasta for this treatment and monitor his counts for subsequent cycles.  7. Nausea and vomiting: Related to chemotherapy. Improved since this visit.  8. Follow-up: Will be in 3 weeks for the next cycle of chemotherapy.Marland Kitchen   Evangeline Utley,MD 6/27/201810:11 AM

## 2016-09-19 NOTE — Progress Notes (Signed)
Nutrition follow-up completed with patient receiving treatment for prostate cancer. Weight improved documented as 168.7 pounds on June 27, which is stable overall. Patient reports appetite is improved as well as nausea and vomiting improved. Patient denies other nutrition impact symptoms.  Nutrition diagnosis: Unintended weight loss has improved.  Intervention: Patient will continue to try to increase calories and protein for healing and weight maintenance. Teach back method used.  Monitoring, evaluation, goals: Patient will work to increase calories and protein for weight maintenance.  Next visit: Wednesday, August 8, during infusion area  **Disclaimer: This note was dictated with voice recognition software. Similar sounding words can inadvertently be transcribed and this note may contain transcription errors which may not have been corrected upon publication of note.**

## 2016-09-20 ENCOUNTER — Ambulatory Visit: Payer: Medicare Other

## 2016-09-20 ENCOUNTER — Telehealth: Payer: Self-pay | Admitting: *Deleted

## 2016-09-20 LAB — PSA: Prostate Specific Ag, Serum: 34.8 ng/mL — ABNORMAL HIGH (ref 0.0–4.0)

## 2016-09-20 NOTE — Telephone Encounter (Signed)
-----   Message from Wyatt Portela, MD sent at 09/20/2016  8:27 AM EDT ----- Please let him know his PSA did not change much. No change in treatment for now.

## 2016-09-20 NOTE — Telephone Encounter (Signed)
Spoke with patient, gave results of last PSA 

## 2016-10-10 ENCOUNTER — Ambulatory Visit (HOSPITAL_BASED_OUTPATIENT_CLINIC_OR_DEPARTMENT_OTHER): Payer: Medicare Other | Admitting: Oncology

## 2016-10-10 ENCOUNTER — Telehealth: Payer: Self-pay | Admitting: Oncology

## 2016-10-10 ENCOUNTER — Ambulatory Visit (HOSPITAL_BASED_OUTPATIENT_CLINIC_OR_DEPARTMENT_OTHER): Payer: Medicare Other

## 2016-10-10 ENCOUNTER — Other Ambulatory Visit (HOSPITAL_BASED_OUTPATIENT_CLINIC_OR_DEPARTMENT_OTHER): Payer: Medicare Other

## 2016-10-10 VITALS — BP 152/79 | HR 68 | Temp 98.2°F | Resp 17 | Ht 71.0 in | Wt 167.9 lb

## 2016-10-10 DIAGNOSIS — Z5111 Encounter for antineoplastic chemotherapy: Secondary | ICD-10-CM

## 2016-10-10 DIAGNOSIS — E291 Testicular hypofunction: Secondary | ICD-10-CM

## 2016-10-10 DIAGNOSIS — C7951 Secondary malignant neoplasm of bone: Secondary | ICD-10-CM

## 2016-10-10 DIAGNOSIS — C61 Malignant neoplasm of prostate: Secondary | ICD-10-CM

## 2016-10-10 DIAGNOSIS — R112 Nausea with vomiting, unspecified: Secondary | ICD-10-CM

## 2016-10-10 DIAGNOSIS — R630 Anorexia: Secondary | ICD-10-CM

## 2016-10-10 LAB — COMPREHENSIVE METABOLIC PANEL
ALT: 7 U/L (ref 0–55)
AST: 8 U/L (ref 5–34)
Albumin: 4 g/dL (ref 3.5–5.0)
Alkaline Phosphatase: 67 U/L (ref 40–150)
Anion Gap: 9 mEq/L (ref 3–11)
BUN: 11.9 mg/dL (ref 7.0–26.0)
CHLORIDE: 112 meq/L — AB (ref 98–109)
CO2: 23 meq/L (ref 22–29)
Calcium: 9.8 mg/dL (ref 8.4–10.4)
Creatinine: 1.1 mg/dL (ref 0.7–1.3)
EGFR: 69 mL/min/{1.73_m2} — AB (ref >90)
Glucose: 85 mg/dl (ref 70–140)
POTASSIUM: 4.9 meq/L (ref 3.5–5.1)
SODIUM: 145 meq/L (ref 136–145)
Total Bilirubin: 0.4 mg/dL (ref 0.20–1.20)
Total Protein: 6.9 g/dL (ref 6.4–8.3)

## 2016-10-10 LAB — CBC WITH DIFFERENTIAL/PLATELET
BASO%: 0.8 % (ref 0.0–2.0)
BASOS ABS: 0 10*3/uL (ref 0.0–0.1)
EOS%: 0.2 % (ref 0.0–7.0)
Eosinophils Absolute: 0 10*3/uL (ref 0.0–0.5)
HCT: 35.7 % — ABNORMAL LOW (ref 38.4–49.9)
HGB: 11.9 g/dL — ABNORMAL LOW (ref 13.0–17.1)
LYMPH%: 39.5 % (ref 14.0–49.0)
MCH: 29.3 pg (ref 27.2–33.4)
MCHC: 33.2 g/dL (ref 32.0–36.0)
MCV: 88.2 fL (ref 79.3–98.0)
MONO#: 0.5 10*3/uL (ref 0.1–0.9)
MONO%: 14.4 % — AB (ref 0.0–14.0)
NEUT%: 45.1 % (ref 39.0–75.0)
NEUTROS ABS: 1.5 10*3/uL (ref 1.5–6.5)
Platelets: 189 10*3/uL (ref 140–400)
RBC: 4.05 10*6/uL — AB (ref 4.20–5.82)
RDW: 15.7 % — AB (ref 11.0–14.6)
WBC: 3.4 10*3/uL — AB (ref 4.0–10.3)
lymph#: 1.3 10*3/uL (ref 0.9–3.3)

## 2016-10-10 MED ORDER — CABAZITAXEL CHEMO INJECTION 60 MG/6ML W/DILUENT
20.0000 mg/m2 | Freq: Once | INTRAVENOUS | Status: AC
  Administered 2016-10-10: 39 mg via INTRAVENOUS
  Filled 2016-10-10: qty 3.9

## 2016-10-10 MED ORDER — DIPHENHYDRAMINE HCL 50 MG/ML IJ SOLN
INTRAMUSCULAR | Status: AC
  Filled 2016-10-10: qty 1

## 2016-10-10 MED ORDER — SODIUM CHLORIDE 0.9 % IV SOLN
Freq: Once | INTRAVENOUS | Status: AC
  Administered 2016-10-10: 12:00:00 via INTRAVENOUS

## 2016-10-10 MED ORDER — DEXAMETHASONE 4 MG PO TABS: ORAL_TABLET | ORAL | 3 refills | Status: DC

## 2016-10-10 MED ORDER — FAMOTIDINE IN NACL 20-0.9 MG/50ML-% IV SOLN
20.0000 mg | Freq: Once | INTRAVENOUS | Status: AC
  Administered 2016-10-10: 20 mg via INTRAVENOUS

## 2016-10-10 MED ORDER — FAMOTIDINE IN NACL 20-0.9 MG/50ML-% IV SOLN
INTRAVENOUS | Status: AC
  Filled 2016-10-10: qty 50

## 2016-10-10 MED ORDER — PALONOSETRON HCL INJECTION 0.25 MG/5ML
INTRAVENOUS | Status: AC
  Filled 2016-10-10: qty 5

## 2016-10-10 MED ORDER — SODIUM CHLORIDE 0.9 % IV SOLN
Freq: Once | INTRAVENOUS | Status: AC
  Administered 2016-10-10: 12:00:00 via INTRAVENOUS
  Filled 2016-10-10: qty 5

## 2016-10-10 MED ORDER — DIPHENHYDRAMINE HCL 50 MG/ML IJ SOLN
25.0000 mg | Freq: Once | INTRAMUSCULAR | Status: AC
  Administered 2016-10-10: 25 mg via INTRAVENOUS

## 2016-10-10 MED ORDER — PROMETHAZINE HCL 25 MG PO TABS: 25.0000 mg | ORAL_TABLET | Freq: Four times a day (QID) | ORAL | 0 refills | Status: DC | PRN

## 2016-10-10 MED ORDER — PALONOSETRON HCL INJECTION 0.25 MG/5ML
0.2500 mg | Freq: Once | INTRAVENOUS | Status: AC
  Administered 2016-10-10: 0.25 mg via INTRAVENOUS

## 2016-10-10 NOTE — Telephone Encounter (Signed)
Gave patient avs report and appointments for August. No f/u added with 8/29 lab/chemo due to FS out on PAL week of 8/29. Message to [facility] re what to do about f/u 8/29.

## 2016-10-10 NOTE — Patient Instructions (Signed)
[place] Discharge Instructions for Patients Receiving Chemotherapy  Today you received the following chemotherapy agents Jevtana   To help prevent nausea and vomiting after your treatment, we encourage you to take your nausea medication as directed. No Zofran for 3 days. Take Compazine instead.    If you develop nausea and vomiting that is not controlled by your nausea medication, call the clinic.   BELOW ARE SYMPTOMS THAT SHOULD BE REPORTED IMMEDIATELY:  *FEVER GREATER THAN 100.5 F  *CHILLS WITH OR WITHOUT FEVER  NAUSEA AND VOMITING THAT IS NOT CONTROLLED WITH YOUR NAUSEA MEDICATION  *UNUSUAL SHORTNESS OF BREATH  *UNUSUAL BRUISING OR BLEEDING  TENDERNESS IN MOUTH AND THROAT WITH OR WITHOUT PRESENCE OF ULCERS  *URINARY PROBLEMS  *BOWEL PROBLEMS  UNUSUAL RASH Items with * indicate a potential emergency and should be followed up as soon as possible.  Feel free to call the clinic you have any questions or concerns. The clinic phone number is [phone].  Please show the [facility] at check-in to the Emergency Department and triage nurse.

## 2016-10-10 NOTE — Progress Notes (Signed)
Hematology and Oncology Follow Up Visit  [name] [id number] 1945/10/26 71 y.o. 10/10/2016 10:24 AM   Principle Diagnosis: 71 year old gentleman with prostate cancer diagnosed in October 2015. He had a Gleason score 4+5 = 9 and a PSA of 116. Abdominal imaging showed bulky retroperitoneal lymphadenopathy.   Prior Therapy:  Status post prostate biopsy on 01/19/2014. Systemic chemotherapy in the form of Taxotere 75 mg/m every 3 weeks with plans for total of 6 cycles. Cycle 1 given on 05/05/2014. He is status post 6 cycles of therapy concluded on 08/20/2014. Xtandi 160 mg daily started on 07/25/2015. Therapy discontinued in May 2018 because of progression of disease.   Current therapy:  Androgen deprivation under the care of Dr. [name]. He receives Lupron every 6 months.  Xgeva monthly injections started11/11/2015.  Jevtana chemotherapy started on 08/08/2016. He received 25 mg/m cycle 1. He will receive 20 mg/m in subsequent cycles. He is here for cycle 4.  Interim History:  [name] presents today for a follow-up visit with his wife. Since the last visit, he tolerated the last cycle of chemotherapy better than the previous ones. He still experiencing some delayed nausea and vomiting 3-4 days after completion of chemotherapy. He reports he has had 2 days without any issues after infusion but experiences worsening symptoms after that. The symptoms lasted about a week and this subsided after that.   He did not receive Neulasta with the last chemotherapy infusion and have done better after that for men arthralgia and myalgia perspective. His activity level. Status is improving. His appetite also except that for 2 weeks and his weight is stable. Denies any neuropathy or diarrhea.  He does not report any headaches, blurry vision or syncope. He does not report any seizures or any neurological symptoms. He does not report any fevers, chills, weight loss or decline his energy. He does not report  any chest pain, palpitation, orthopnea or leg edema. He does not report any cough, hemoptysis or hematemesis. He does not report any nausea, vomiting, abdominal pain, constipation or diarrhea.  Remainder of his review of systems unremarkable.   Medications: I have reviewed the patient's current medications.  Current Outpatient Prescriptions  Medication Sig Dispense Refill  . aspirin 81 MG tablet Take 81 mg by mouth every morning.     Marland Kitchen dexamethasone (DECADRON) 4 MG tablet Take one tablet twice a day. Start 2 days after chemotherapy for 5 days. 20 tablet 3  . diphenhydramine-acetaminophen (TYLENOL PM) 25-500 MG TABS tablet Take 2 tablets by mouth at bedtime as needed.    . ondansetron (ZOFRAN) 4 MG tablet Take 1 tablet (4 mg total) by mouth every 8 (eight) hours as needed for nausea or vomiting. 40 tablet 1  . prochlorperazine (COMPAZINE) 10 MG tablet Take 1 tablet (10 mg total) by mouth every 6 (six) hours as needed for nausea or vomiting. 30 tablet 1  . promethazine (PHENERGAN) 25 MG tablet Take 1 tablet (25 mg total) by mouth every 6 (six) hours as needed for nausea or vomiting. 30 tablet 0  . pseudoephedrine (SUDAFED) 30 MG tablet Take 30 mg by mouth as directed.     No current facility-administered medications for this visit.    Facility-Administered Medications Ordered in Other Visits  Medication Dose Route Frequency Provider Last Rate Last Dose  . denosumab (XGEVA) injection 120 mg  120 mg Subcutaneous Once Julissa Browning, [name], MD         Allergies: No Known Allergies  Past Medical History, Surgical  history, Social history, and Family History were reviewed and updated.   Physical Exam: Blood pressure (!) 152/79, pulse 68, temperature 98.2 F (36.8 C), temperature source Oral, resp. rate 17, height 5\' 11"  (1.803 m), weight 167 lb 14.4 oz (76.2 kg), SpO2 100 %. ECOG: 1 General appearance: Well-appearing gentleman appeared without distress. Head: Normocephalic, without obvious  abnormality no oral thrush or ulcers. Neck: no adenopathy no thyroid masses. Lymph nodes: Cervical, supraclavicular, and axillary nodes normal. Heart:regular rate and rhythm, S1, S2 normal, no murmur, click, rub or gallop Lung:chest clear, no wheezing, rales, normal symmetric air entry no dullness to percussion. Abdomin: soft, non-tender, without masses or organomegaly no rebound or guarding. EXT:no erythema, induration, or nodules   Lab Results: Lab Results  Component Value Date   WBC 3.4 (L) 10/10/2016   HGB 11.9 (L) 10/10/2016   HCT 35.7 (L) 10/10/2016   MCV 88.2 10/10/2016   PLT 189 10/10/2016     Chemistry      Component Value Date/Time   NA 145 09/19/2016 0936   K 4.4 09/19/2016 0936   CL 108 10/04/2014 0749   CO2 21 (L) 09/19/2016 0936   BUN 15.7 09/19/2016 0936   CREATININE 1.0 09/19/2016 0936      Component Value Date/Time   CALCIUM 9.3 09/19/2016 0936   ALKPHOS 60 09/19/2016 0936   AST 8 09/19/2016 0936   ALT 6 09/19/2016 0936   BILITOT 0.42 09/19/2016 0936      Results for [name] (MRN [id number]) as of 10/10/2016 09:39  Ref. Range 08/08/2016 08:00 08/29/2016 10:05 09/19/2016 09:35  Prostate Specific Ag, Serum Latest Ref Range: 0.0 - 4.0 ng/mL 28.8 (H) 33.6 (H) 34.8 (H)      Impression and Plan:  71 year old gentleman with the following issues:  1. Advanced prostate cancer presenting with stage IV disease. His Gleason score is 4+5 = 9 with heavy volume disease involving 11 out of 12 cores of his biopsy obtained in October 2015. His PSA is 116 and his staging workup revealed lymphadenopathy in the periaortic and iliac chains. He started androgen depravation under the care of Dr. [name].  He is status post 6 cycles of chemotherapy and currently receiving androgen deprivation.    PSA increased up to 5.4 and staging workup on 07/19/2015 with bone scan is indicating bony metastasis including activity in the sixth rib and the inferior left ilium. 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  He started on Xtandi on 07/25/2015 and have tolerated it well.   CT scan and bone scan obtained on 04/10/2016 showed some mild progression of his disease predominantly in the bone. No visceral metastasis noted.  His PSA on 08/08/2016 was 28.8 with a rapid rise and a doubling time of 6 weeks.  He is currently receiving Jevtana have tolerated the last cycle with few complications. The plan is to proceed today without any dose reduction or delay with some adjustments in his delayed antiemetics. His PSA has been stable the last few treatments and we will continue to monitor closely.  2. Anorexia: Appears to have improved at this time.  3. Androgen depravation: He has been receiving Lupron at [facility] and we will switch his injections to the [facility] in the future. His last Lupron was in February 2018 and will receive his next Lupron in July 2018.  4. Bone directed therapy: Delton See will be on hold for the time being until his symptoms improve.  5. IV access: Peripheral veins will be used for the time being. Port-A-Cath can be reinserted in the future.  6. Neutropenia prophylaxis: Neulasta for this treatment and monitor his counts for subsequent cycles.  7. Nausea and vomiting: Related to chemotherapy. I will add the dexamethasone at 4 mg twice a day for 3-5 days to eliminate to have delayed nausea and vomiting. I also added Phenergan to use as needed.  8. Follow-up: Will be in 3 weeks for the next cycle of chemotherapy.Marland Kitchen   Shawnmichael Parenteau,MD 7/18/201810:24 AM

## 2016-10-11 ENCOUNTER — Telehealth: Payer: Self-pay | Admitting: *Deleted

## 2016-10-11 ENCOUNTER — Ambulatory Visit: Payer: Medicare Other

## 2016-10-11 LAB — PSA: PROSTATE SPECIFIC AG, SERUM: 32 ng/mL — AB (ref 0.0–4.0)

## 2016-10-11 NOTE — Telephone Encounter (Signed)
-----   Message from Wyatt Portela, MD sent at 10/11/2016 10:05 AM EDT ----- Please let him know his PSA went down some.

## 2016-10-11 NOTE — Telephone Encounter (Signed)
Spoke with patient, gave results of last PSA 

## 2016-10-22 NOTE — Telephone Encounter (Signed)
Per response from FS have patient see [facility]. [facility] schedule now open - added f/u for 8/29 w/KC at 8:30 am - lab moved to 8 am from 8:30 am.  Patient will get new schedule 8/8 at next visit with FS.

## 2016-10-26 ENCOUNTER — Ambulatory Visit: Payer: Medicare Other

## 2016-10-31 ENCOUNTER — Ambulatory Visit (HOSPITAL_BASED_OUTPATIENT_CLINIC_OR_DEPARTMENT_OTHER): Payer: Medicare Other

## 2016-10-31 ENCOUNTER — Ambulatory Visit (HOSPITAL_BASED_OUTPATIENT_CLINIC_OR_DEPARTMENT_OTHER): Payer: Medicare Other | Admitting: Oncology

## 2016-10-31 ENCOUNTER — Ambulatory Visit: Payer: Medicare Other | Admitting: Nutrition

## 2016-10-31 ENCOUNTER — Other Ambulatory Visit (HOSPITAL_BASED_OUTPATIENT_CLINIC_OR_DEPARTMENT_OTHER): Payer: Medicare Other

## 2016-10-31 VITALS — BP 155/80 | HR 67 | Temp 97.8°F | Resp 18 | Ht 71.0 in | Wt 172.6 lb

## 2016-10-31 DIAGNOSIS — C7951 Secondary malignant neoplasm of bone: Secondary | ICD-10-CM | POA: Diagnosis not present

## 2016-10-31 DIAGNOSIS — R112 Nausea with vomiting, unspecified: Secondary | ICD-10-CM

## 2016-10-31 DIAGNOSIS — Z5111 Encounter for antineoplastic chemotherapy: Secondary | ICD-10-CM | POA: Diagnosis present

## 2016-10-31 DIAGNOSIS — C61 Malignant neoplasm of prostate: Secondary | ICD-10-CM

## 2016-10-31 LAB — CBC WITH DIFFERENTIAL/PLATELET
BASO%: 0.2 % (ref 0.0–2.0)
Basophils Absolute: 0 10*3/uL (ref 0.0–0.1)
EOS%: 0.2 % (ref 0.0–7.0)
Eosinophils Absolute: 0 10*3/uL (ref 0.0–0.5)
HEMATOCRIT: 36.8 % — AB (ref 38.4–49.9)
HEMOGLOBIN: 12.1 g/dL — AB (ref 13.0–17.1)
LYMPH#: 1.4 10*3/uL (ref 0.9–3.3)
LYMPH%: 32.5 % (ref 14.0–49.0)
MCH: 29.5 pg (ref 27.2–33.4)
MCHC: 32.9 g/dL (ref 32.0–36.0)
MCV: 89.8 fL (ref 79.3–98.0)
MONO#: 0.5 10*3/uL (ref 0.1–0.9)
MONO%: 12.2 % (ref 0.0–14.0)
NEUT%: 54.9 % (ref 39.0–75.0)
NEUTROS ABS: 2.4 10*3/uL (ref 1.5–6.5)
Platelets: 214 10*3/uL (ref 140–400)
RBC: 4.1 10*6/uL — ABNORMAL LOW (ref 4.20–5.82)
RDW: 16 % — AB (ref 11.0–14.6)
WBC: 4.3 10*3/uL (ref 4.0–10.3)

## 2016-10-31 LAB — COMPREHENSIVE METABOLIC PANEL
ALT: 7 U/L (ref 0–55)
AST: 9 U/L (ref 5–34)
Albumin: 3.7 g/dL (ref 3.5–5.0)
Alkaline Phosphatase: 66 U/L (ref 40–150)
Anion Gap: 10 mEq/L (ref 3–11)
BUN: 15.5 mg/dL (ref 7.0–26.0)
CO2: 21 mEq/L — ABNORMAL LOW (ref 22–29)
Calcium: 9.6 mg/dL (ref 8.4–10.4)
Chloride: 112 mEq/L — ABNORMAL HIGH (ref 98–109)
Creatinine: 1.1 mg/dL (ref 0.7–1.3)
EGFR: 67 mL/min/{1.73_m2} — ABNORMAL LOW (ref >90)
Glucose: 85 mg/dl (ref 70–140)
Potassium: 4.5 mEq/L (ref 3.5–5.1)
Sodium: 143 mEq/L (ref 136–145)
Total Bilirubin: 0.3 mg/dL (ref 0.20–1.20)
Total Protein: 6.8 g/dL (ref 6.4–8.3)

## 2016-10-31 MED ORDER — SODIUM CHLORIDE 0.9 % IV SOLN
Freq: Once | INTRAVENOUS | Status: AC
  Administered 2016-10-31: 12:00:00 via INTRAVENOUS
  Filled 2016-10-31: qty 5

## 2016-10-31 MED ORDER — PALONOSETRON HCL INJECTION 0.25 MG/5ML
INTRAVENOUS | Status: AC
  Filled 2016-10-31: qty 5

## 2016-10-31 MED ORDER — SODIUM CHLORIDE 0.9 % IV SOLN
Freq: Once | INTRAVENOUS | Status: AC
  Administered 2016-10-31: 12:00:00 via INTRAVENOUS

## 2016-10-31 MED ORDER — DIPHENHYDRAMINE HCL 50 MG/ML IJ SOLN
25.0000 mg | Freq: Once | INTRAMUSCULAR | Status: AC
  Administered 2016-10-31: 25 mg via INTRAVENOUS

## 2016-10-31 MED ORDER — PALONOSETRON HCL INJECTION 0.25 MG/5ML
0.2500 mg | Freq: Once | INTRAVENOUS | Status: AC
  Administered 2016-10-31: 0.25 mg via INTRAVENOUS

## 2016-10-31 MED ORDER — FAMOTIDINE IN NACL 20-0.9 MG/50ML-% IV SOLN
20.0000 mg | Freq: Once | INTRAVENOUS | Status: AC
  Administered 2016-10-31: 20 mg via INTRAVENOUS

## 2016-10-31 MED ORDER — DEXTROSE 5 % IV SOLN
20.0000 mg/m2 | Freq: Once | INTRAVENOUS | Status: AC
  Administered 2016-10-31: 39 mg via INTRAVENOUS
  Filled 2016-10-31: qty 3.9

## 2016-10-31 MED ORDER — DIPHENHYDRAMINE HCL 50 MG/ML IJ SOLN
INTRAMUSCULAR | Status: AC
  Filled 2016-10-31: qty 1

## 2016-10-31 MED ORDER — FAMOTIDINE IN NACL 20-0.9 MG/50ML-% IV SOLN
INTRAVENOUS | Status: AC
  Filled 2016-10-31: qty 50

## 2016-10-31 MED ORDER — DEXAMETHASONE 4 MG PO TABS: ORAL_TABLET | ORAL | 3 refills | Status: DC

## 2016-10-31 NOTE — Patient Instructions (Addendum)
[facility] Discharge Instructions for Patients Receiving Chemotherapy  Today you received the following chemotherapy agents Jevtana.  To help prevent nausea and vomiting after your treatment, we encourage you to take your nausea medication as prescribed.   If you develop nausea and vomiting that is not controlled by your nausea medication, call the clinic.   BELOW ARE SYMPTOMS THAT SHOULD BE REPORTED IMMEDIATELY:  *FEVER GREATER THAN 100.5 F  *CHILLS WITH OR WITHOUT FEVER  NAUSEA AND VOMITING THAT IS NOT CONTROLLED WITH YOUR NAUSEA MEDICATION  *UNUSUAL SHORTNESS OF BREATH  *UNUSUAL BRUISING OR BLEEDING  TENDERNESS IN MOUTH AND THROAT WITH OR WITHOUT PRESENCE OF ULCERS  *URINARY PROBLEMS  *BOWEL PROBLEMS  UNUSUAL RASH Items with * indicate a potential emergency and should be followed up as soon as possible.  Feel free to call the clinic you have any questions or concerns. The clinic phone number is [phone].  Please show the CHEMO ALERT CARD at check-in to the Emergency Department and triage nurse.   

## 2016-10-31 NOTE — Progress Notes (Signed)
Nutrition follow-up completed with patient receiving treatment for prostate cancer. Weight improved documented as 172.6 pounds on August 8 increased from 167.9 pounds July 18. Patient describes taste alterations limiting his oral intake. Patient denies other nutrition impact symptoms.  Nutrition diagnosis: Unintended weight loss has improved.  Intervention: Patient was educated on strategies for improving taste alterations and provided with fact sheet. Questions were answered.  Teach back method used.  Monitoring, evaluation, goals:  Patient will tolerate adequate calories and protein for weight maintenance.  Next visit: Wednesday, August 29, during infusion.  **Disclaimer: This note was dictated with voice recognition software. Similar sounding words can inadvertently be transcribed and this note may contain transcription errors which may not have been corrected upon publication of note.**

## 2016-10-31 NOTE — Progress Notes (Signed)
Hematology and Oncology Follow Up Visit  [name] [name] [id number] [birth year] 71 y.o. 10/31/2016 9:58 AM   Principle Diagnosis: 71 year old gentleman with prostate cancer diagnosed in October 2015. He had a Gleason score 4+5 = 9 and a PSA of 116. Abdominal imaging showed bulky retroperitoneal lymphadenopathy.   Prior Therapy:  Status post prostate biopsy on 01/19/2014. Systemic chemotherapy in the form of Taxotere 75 mg/m every 3 weeks with plans for total of 6 cycles. Cycle 1 given on 05/05/2014. He is status post 6 cycles of therapy concluded on 08/20/2014. Xtandi 160 mg daily started on 07/25/2015. Therapy discontinued in May 2018 because of progression of disease.   Current therapy:  Androgen deprivation under the care of Dr. [name]. He receives Lupron every 6 months.  Xgeva monthly injections started11/11/2015.  Jevtana chemotherapy started on 08/08/2016. He received 25 mg/m cycle 1. He will receive 20 mg/m in subsequent cycles. He is here for cycle 5.  Interim History:  [name] [name] presents today for a follow-up visit with his wife. Since the last visit, he reports feeling better since the last treatment. He took dexamethasone for 5 days after the last cycle of chemotherapy which helped his symptoms. He reports no nausea or vomiting and was able to eat better. He gained weight since the last visit. He denied any diarrhea, infusion related complications or neuropathy. His performance status and activity level is improving slowly and was able to exercise outside for the first time in weeks.  He does not report any headaches, blurry vision or syncope. He does not report any seizures or any neurological symptoms. He does not report any fevers, chills, weight loss or decline his energy. He does not report any chest pain, palpitation, orthopnea or leg edema. He does not report any cough, hemoptysis or hematemesis. He does not report any nausea, vomiting, abdominal pain, constipation or diarrhea.   Remainder of his review of systems unremarkable.   Medications: I have reviewed the patient's current medications.  Current Outpatient Prescriptions  Medication Sig Dispense Refill  . aspirin 81 MG tablet Take 81 mg by mouth every morning.     Marland Kitchen dexamethasone (DECADRON) 4 MG tablet Take one tablet twice a day. Start 2 days after chemotherapy for 5 days. 20 tablet 3  . diphenhydramine-acetaminophen (TYLENOL PM) 25-500 MG TABS tablet Take 2 tablets by mouth at bedtime as needed.    . ondansetron (ZOFRAN) 4 MG tablet Take 1 tablet (4 mg total) by mouth every 8 (eight) hours as needed for nausea or vomiting. 40 tablet 1  . prochlorperazine (COMPAZINE) 10 MG tablet Take 1 tablet (10 mg total) by mouth every 6 (six) hours as needed for nausea or vomiting. 30 tablet 1  . promethazine (PHENERGAN) 25 MG tablet Take 1 tablet (25 mg total) by mouth every 6 (six) hours as needed for nausea or vomiting. 30 tablet 0  . pseudoephedrine (SUDAFED) 30 MG tablet Take 30 mg by mouth as directed.     No current facility-administered medications for this visit.    Facility-Administered Medications Ordered in Other Visits  Medication Dose Route Frequency Provider Last Rate Last Dose  . denosumab (XGEVA) injection 120 mg  120 mg Subcutaneous Once Lavalle Skoda, [name], MD         Allergies: No Known Allergies  Past Medical History, Surgical history, Social history, and Family History were reviewed and updated.   Physical Exam: Blood pressure (!) 155/80, pulse 67, temperature 97.8 F (36.6 C), temperature source Oral, resp. rate  18, height 5\' 11"  (1.803 m), weight 172 lb 9.6 oz (78.3 kg), SpO2 100 %. ECOG: 1 General appearance: Alert, awake gentleman without distress. Head: Normocephalic, without obvious abnormality no oral thrush or ulcers. Neck: no adenopathy no thyroid masses. Lymph nodes: Cervical, supraclavicular, and axillary nodes normal. Heart:regular rate and rhythm, S1, S2 normal, no murmur, click, rub  or gallop Lung:chest clear, no wheezing, rales, normal symmetric air entry no dullness to percussion. Abdomin: soft, non-tender, without masses or organomegaly no shifting dullness or ascites. EXT:no erythema, induration, or nodules   Lab Results: Lab Results  Component Value Date   WBC 4.3 10/31/2016   HGB 12.1 (L) 10/31/2016   HCT 36.8 (L) 10/31/2016   MCV 89.8 10/31/2016   PLT 214 10/31/2016     Chemistry      Component Value Date/Time   NA 145 10/10/2016 0949   K 4.9 10/10/2016 0949   CL 108 10/04/2014 0749   CO2 23 10/10/2016 0949   BUN 11.9 10/10/2016 0949   CREATININE 1.1 10/10/2016 0949      Component Value Date/Time   CALCIUM 9.8 10/10/2016 0949   ALKPHOS 67 10/10/2016 0949   AST 8 10/10/2016 0949   ALT 7 10/10/2016 0949   BILITOT 0.40 10/10/2016 0949        Results for [name] [name] ARNS (MRN [id number]) as of 10/31/2016 09:44  Ref. Range 09/19/2016 09:35 10/10/2016 09:49  Prostate Specific Ag, Serum Latest Ref Range: 0.0 - 4.0 ng/mL 34.8 (H) 32.0 (H)     Impression and Plan:  70 year old gentleman with the following issues:  1. Advanced prostate cancer presenting with stage IV disease. His Gleason score is 4+5 = 9 with heavy volume disease involving 11 out of 12 cores of his biopsy obtained in October 2015. His PSA is 116 and his staging workup revealed lymphadenopathy in the periaortic and iliac chains. He started androgen depravation under the care of Dr. [name].  He is status post 6 cycles of chemotherapy and currently receiving androgen deprivation.    PSA increased up to 5.4 and staging workup on 07/19/2015 with bone scan is indicating bony metastasis including activity in the sixth rib and the inferior left ilium. 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  He started on Xtandi on 07/25/2015 and have tolerated it well.   CT scan and bone scan obtained on 04/10/2016 showed some mild progression of his disease  predominantly in the bone. No visceral metastasis noted.  His PSA on 08/08/2016 was 28.8 with a rapid rise and a doubling time of 6 weeks.  He is currently receiving Jevtana and has tolerated the last cycle much better without any delayed nausea or vomiting. The plan is to continue the same dose and schedule as planned.  2. Anorexia: Appears to have improved at this time. He will continue to use steroids for 5 days after each cycle of chemotherapy.  3. Androgen depravation: He has been receiving Lupron at [facility] and we will switch his injections to the [place] in the future. His last Lupron was in Lupron in July 2018.  4. Bone directed therapy: Delton See will be on hold for the time being until his symptoms improve.  5. IV access: Peripheral veins will be used for the time being. Port-A-Cath can be reinstituted if needed to in the future.  6. Neutropenia prophylaxis: Neulasta for this treatment and monitor his counts for subsequent cycles.  7. Nausea and vomiting: Related to  chemotherapy. He will continue dexamethasone 4 mg twice a day for days to eliminate to have delayed nausea and vomiting. He will start this after 48 hours of chemotherapy.  8. Follow-up: Will be in 3 weeks for the next cycle of chemotherapy.Marland Kitchen   Evonne Rinks,MD 8/8/20189:58 AM

## 2016-11-01 ENCOUNTER — Telehealth: Payer: Self-pay | Admitting: *Deleted

## 2016-11-01 LAB — PSA: Prostate Specific Ag, Serum: 31.2 ng/mL — ABNORMAL HIGH (ref 0.0–4.0)

## 2016-11-01 NOTE — Telephone Encounter (Signed)
As noted below by Dr. Shadad, I informed patient of his PSA level. He verbalized understanding.  

## 2016-11-01 NOTE — Telephone Encounter (Signed)
-----   Message from Wyatt Portela, MD sent at 11/01/2016  8:41 AM EDT ----- Please let him know his PSA is down slightly.

## 2016-11-02 ENCOUNTER — Telehealth: Payer: Self-pay | Admitting: Oncology

## 2016-11-02 ENCOUNTER — Ambulatory Visit (HOSPITAL_BASED_OUTPATIENT_CLINIC_OR_DEPARTMENT_OTHER): Payer: Medicare Other

## 2016-11-02 VITALS — BP 140/70 | HR 90 | Temp 97.8°F | Resp 20

## 2016-11-02 DIAGNOSIS — C61 Malignant neoplasm of prostate: Secondary | ICD-10-CM | POA: Diagnosis not present

## 2016-11-02 DIAGNOSIS — Z5111 Encounter for antineoplastic chemotherapy: Secondary | ICD-10-CM

## 2016-11-02 DIAGNOSIS — C7951 Secondary malignant neoplasm of bone: Secondary | ICD-10-CM

## 2016-11-02 MED ORDER — PEGFILGRASTIM INJECTION 6 MG/0.6ML ~~LOC~~
6.0000 mg | PREFILLED_SYRINGE | Freq: Once | SUBCUTANEOUS | Status: AC
  Administered 2016-11-02: 6 mg via SUBCUTANEOUS
  Filled 2016-11-02: qty 0.6

## 2016-11-02 NOTE — Telephone Encounter (Signed)
Scheduled apt per 8/8 los - patient came in today to get schedule - gave calender.

## 2016-11-02 NOTE — Patient Instructions (Signed)
Pegfilgrastim injection What is this medicine? PEGFILGRASTIM (PEG fil gra stim) is a long-acting granulocyte colony-stimulating factor that stimulates the growth of neutrophils, a type of white blood cell important in the body's fight against infection. It is used to reduce the incidence of fever and infection in patients with certain types of cancer who are receiving chemotherapy that affects the bone marrow, and to increase survival after being exposed to high doses of radiation. This medicine may be used for other purposes; ask your health care provider or pharmacist if you have questions. COMMON BRAND NAME(S): Neulasta What should I tell my health care provider before I take this medicine? They need to know if you have any of these conditions: -kidney disease -latex allergy -ongoing radiation therapy -sickle cell disease -skin reactions to acrylic adhesives (On-Body Injector only) -an unusual or allergic reaction to pegfilgrastim, filgrastim, other medicines, foods, dyes, or preservatives -pregnant or trying to get pregnant -breast-feeding How should I use this medicine? This medicine is for injection under the skin. If you get this medicine at home, you will be taught how to prepare and give the pre-filled syringe or how to use the On-body Injector. Refer to the patient Instructions for Use for detailed instructions. Use exactly as directed. Tell your healthcare provider immediately if you suspect that the On-body Injector may not have performed as intended or if you suspect the use of the On-body Injector resulted in a missed or partial dose. It is important that you put your used needles and syringes in a special sharps container. Do not put them in a trash can. If you do not have a sharps container, call your pharmacist or healthcare provider to get one. Talk to your pediatrician regarding the use of this medicine in children. While this drug may be prescribed for selected conditions,  precautions do apply. Overdosage: If you think you have taken too much of this medicine contact a poison control center or emergency room at once. NOTE: This medicine is only for you. Do not share this medicine with others. What if I miss a dose? It is important not to miss your dose. Call your doctor or health care professional if you miss your dose. If you miss a dose due to an On-body Injector failure or leakage, a new dose should be administered as soon as possible using a single prefilled syringe for manual use. What may interact with this medicine? Interactions have not been studied. Give your health care provider a list of all the medicines, herbs, non-prescription drugs, or dietary supplements you use. Also tell them if you smoke, drink alcohol, or use illegal drugs. Some items may interact with your medicine. This list may not describe all possible interactions. Give your health care provider a list of all the medicines, herbs, non-prescription drugs, or dietary supplements you use. Also tell them if you smoke, drink alcohol, or use illegal drugs. Some items may interact with your medicine. What should I watch for while using this medicine? You may need blood work done while you are taking this medicine. If you are going to need a MRI, CT scan, or other procedure, tell your doctor that you are using this medicine (On-Body Injector only). What side effects may I notice from receiving this medicine? Side effects that you should report to your doctor or health care professional as soon as possible: -allergic reactions like skin rash, itching or hives, swelling of the face, lips, or tongue -dizziness -fever -pain, redness, or irritation at site   where injected -pinpoint red spots on the skin -red or dark-brown urine -shortness of breath or breathing problems -stomach or side pain, or pain at the shoulder -swelling -tiredness -trouble passing urine or change in the amount of urine Side  effects that usually do not require medical attention (report to your doctor or health care professional if they continue or are bothersome): -bone pain -muscle pain This list may not describe all possible side effects. Call your doctor for medical advice about side effects. You may report side effects to FDA at 1-800-FDA-1088. Where should I keep my medicine? Keep out of the reach of children. Store pre-filled syringes in a refrigerator between 2 and 8 degrees C (36 and 46 degrees F). Do not freeze. Keep in carton to protect from light. Throw away this medicine if it is left out of the refrigerator for more than 48 hours. Throw away any unused medicine after the expiration date. NOTE: This sheet is a summary. It may not cover all possible information. If you have questions about this medicine, talk to your doctor, pharmacist, or health care provider.  2018 Elsevier/Gold Standard (2016-03-08 12:58:03)  

## 2016-11-19 ENCOUNTER — Encounter: Payer: Self-pay | Admitting: Oncology

## 2016-11-20 DIAGNOSIS — H2513 Age-related nuclear cataract, bilateral: Secondary | ICD-10-CM | POA: Diagnosis not present

## 2016-11-21 ENCOUNTER — Ambulatory Visit (HOSPITAL_BASED_OUTPATIENT_CLINIC_OR_DEPARTMENT_OTHER): Payer: Medicare Other | Admitting: Oncology

## 2016-11-21 ENCOUNTER — Ambulatory Visit: Payer: Medicare Other | Admitting: Nutrition

## 2016-11-21 ENCOUNTER — Ambulatory Visit (HOSPITAL_BASED_OUTPATIENT_CLINIC_OR_DEPARTMENT_OTHER): Payer: Medicare Other

## 2016-11-21 ENCOUNTER — Telehealth: Payer: Self-pay | Admitting: Oncology

## 2016-11-21 ENCOUNTER — Encounter: Payer: Self-pay | Admitting: Oncology

## 2016-11-21 VITALS — BP 117/78 | HR 76 | Temp 97.5°F | Resp 18 | Wt 171.4 lb

## 2016-11-21 DIAGNOSIS — R112 Nausea with vomiting, unspecified: Secondary | ICD-10-CM | POA: Diagnosis not present

## 2016-11-21 DIAGNOSIS — Z5111 Encounter for antineoplastic chemotherapy: Secondary | ICD-10-CM

## 2016-11-21 DIAGNOSIS — C7951 Secondary malignant neoplasm of bone: Secondary | ICD-10-CM

## 2016-11-21 DIAGNOSIS — C61 Malignant neoplasm of prostate: Secondary | ICD-10-CM

## 2016-11-21 LAB — CBC WITH DIFFERENTIAL/PLATELET
BASO%: 0.4 % (ref 0.0–2.0)
BASOS ABS: 0 10*3/uL (ref 0.0–0.1)
EOS ABS: 0.1 10*3/uL (ref 0.0–0.5)
EOS%: 1 % (ref 0.0–7.0)
HCT: 36.6 % — ABNORMAL LOW (ref 38.4–49.9)
HEMOGLOBIN: 12.1 g/dL — AB (ref 13.0–17.1)
LYMPH%: 20 % (ref 14.0–49.0)
MCH: 29.7 pg (ref 27.2–33.4)
MCHC: 33 g/dL (ref 32.0–36.0)
MCV: 89.9 fL (ref 79.3–98.0)
MONO#: 0.4 10*3/uL (ref 0.1–0.9)
MONO%: 7.8 % (ref 0.0–14.0)
NEUT#: 4.1 10*3/uL (ref 1.5–6.5)
NEUT%: 70.8 % (ref 39.0–75.0)
Platelets: 248 10*3/uL (ref 140–400)
RBC: 4.08 10*6/uL — ABNORMAL LOW (ref 4.20–5.82)
RDW: 17.8 % — AB (ref 11.0–14.6)
WBC: 5.8 10*3/uL (ref 4.0–10.3)
lymph#: 1.2 10*3/uL (ref 0.9–3.3)

## 2016-11-21 LAB — COMPREHENSIVE METABOLIC PANEL
ALBUMIN: 3.7 g/dL (ref 3.5–5.0)
ALK PHOS: 84 U/L (ref 40–150)
ALT: 8 U/L (ref 0–55)
AST: 8 U/L (ref 5–34)
Anion Gap: 9 mEq/L (ref 3–11)
BUN: 19.3 mg/dL (ref 7.0–26.0)
CHLORIDE: 112 meq/L — AB (ref 98–109)
CO2: 21 mEq/L — ABNORMAL LOW (ref 22–29)
Calcium: 9.9 mg/dL (ref 8.4–10.4)
Creatinine: 1.2 mg/dL (ref 0.7–1.3)
EGFR: 63 mL/min/{1.73_m2} — AB (ref >90)
GLUCOSE: 120 mg/dL (ref 70–140)
POTASSIUM: 4.2 meq/L (ref 3.5–5.1)
SODIUM: 142 meq/L (ref 136–145)
Total Bilirubin: 0.38 mg/dL (ref 0.20–1.20)
Total Protein: 7 g/dL (ref 6.4–8.3)

## 2016-11-21 MED ORDER — FAMOTIDINE IN NACL 20-0.9 MG/50ML-% IV SOLN
INTRAVENOUS | Status: AC
  Filled 2016-11-21: qty 50

## 2016-11-21 MED ORDER — CABAZITAXEL CHEMO INJECTION 60 MG/6ML W/DILUENT
20.0000 mg/m2 | Freq: Once | INTRAVENOUS | Status: AC
  Administered 2016-11-21: 39 mg via INTRAVENOUS
  Filled 2016-11-21: qty 3.9

## 2016-11-21 MED ORDER — DEXAMETHASONE 4 MG PO TABS: ORAL_TABLET | ORAL | 2 refills | Status: DC

## 2016-11-21 MED ORDER — SODIUM CHLORIDE 0.9 % IV SOLN
Freq: Once | INTRAVENOUS | Status: AC
  Administered 2016-11-21: 09:00:00 via INTRAVENOUS

## 2016-11-21 MED ORDER — FAMOTIDINE IN NACL 20-0.9 MG/50ML-% IV SOLN
20.0000 mg | Freq: Once | INTRAVENOUS | Status: AC
  Administered 2016-11-21: 20 mg via INTRAVENOUS

## 2016-11-21 MED ORDER — DIPHENHYDRAMINE HCL 50 MG/ML IJ SOLN
25.0000 mg | Freq: Once | INTRAMUSCULAR | Status: AC
  Administered 2016-11-21: 25 mg via INTRAVENOUS

## 2016-11-21 MED ORDER — PALONOSETRON HCL INJECTION 0.25 MG/5ML
INTRAVENOUS | Status: AC
  Filled 2016-11-21: qty 5

## 2016-11-21 MED ORDER — PALONOSETRON HCL INJECTION 0.25 MG/5ML
0.2500 mg | Freq: Once | INTRAVENOUS | Status: AC
  Administered 2016-11-21: 0.25 mg via INTRAVENOUS

## 2016-11-21 MED ORDER — DIPHENHYDRAMINE HCL 50 MG/ML IJ SOLN
INTRAMUSCULAR | Status: AC
  Filled 2016-11-21: qty 1

## 2016-11-21 MED ORDER — SODIUM CHLORIDE 0.9 % IV SOLN
Freq: Once | INTRAVENOUS | Status: AC
  Administered 2016-11-21: 10:00:00 via INTRAVENOUS
  Filled 2016-11-21: qty 5

## 2016-11-21 NOTE — Progress Notes (Signed)
[name]:    [name] Carbon, MD [place] [address]   DIAGNOSIS: Cancer Staging Prostate cancer [facility]) Staging form: Prostate, AJCC 7th Edition - Clinical: Stage IV (TX, NX, M1) - Unsigned 71 year old gentleman with prostate cancer diagnosed in October 2015. He had a Gleason score 4+5 = 9 and a PSA of 116. Abdominal imaging showed bulky retroperitoneal lymphadenopathy.  SUMMARY OF ONCOLOGIC HISTORY:  No history exists.   PRIOR THERAPY:  Status post prostate biopsy on 01/19/2014. Systemic chemotherapy in the form of Taxotere 75 mg/m every 3 weeks with plans for total of 6 cycles. Cycle 1 given on 05/05/2014. He is status post 6 cycles of therapy concluded on 08/20/2014. Xtandi 160 mg daily started on 07/25/2015. Therapy discontinued in May 2018 because of progression of disease.  CURRENT THERAPY: Androgen deprivation under the care of Dr. [name]. He receives Lupron every 6 months.  Xgeva monthly injections started11/11/2015. Last dose was given on 06/27/2016. Currently on hold due to side effects.  Jevtana chemotherapy started on 08/08/2016. He received 25 mg/m cycle 1. He will receive 20 mg/m in subsequent cycles. He is here for cycle 6.  INTERVAL HISTORY: [name] 71 y.o. male returns for routine follow-up with his wife. He tolerated his last cycle of chemotherapy well. He continues to use dexamethasone for 5 days following his chemotherapy which helps his nausea and vomiting. He reports that he did not have any nausea or vomiting and was able to eat better. His weight has remained stable. He reported redness and itching to his right arm following the last chemotherapy infusion. This was the arm that is infusion was given in. Reports that he had some difficulty finding a vein. Denies fevers and chills. Denies chest pain, shortness of breath, cough, hemoptysis. Denies abdominal pain, nausea, vomiting, diarrhea,  constipation. Denies neuropathy. His performance status and activity level continues to improve. The patient is here for evaluation prior to cycle 6 of his chemotherapy.   Patient Active Problem List   Diagnosis Date Noted  . Encounter for antineoplastic chemotherapy 11/21/2016  . Acute seasonal allergic rhinitis due to pollen 12/28/2015  . Prostate cancer metastatic to multiple sites ([place]) 12/24/2014  . Advance directive discussed with patient 12/24/2014  . Prostate cancer ([place]) 02/23/2014  . Benign paroxysmal positional vertigo 12/07/2013  . Back pain 12/09/2012  . Goals of care, counseling/discussion 07/14/2010  . DIVERTICULOSIS, COLON 10/08/2006    has No Known Allergies.  MEDICAL HISTORY: Past Medical History:  Diagnosis Date  . Diverticulosis of colon   . Hx of colonic polyps   . Prostate cancer [facility])     SURGICAL HISTORY: Past Surgical History:  Procedure Laterality Date  . CARDIOVASCULAR STRESS TEST  1996   Negative    SOCIAL HISTORY: Social History   Social History  . Marital status: Married    Spouse name: N/A  . Number of children: 3  . Years of education: N/A   Occupational History  . cancer registry reporting    Social History Main Topics  . Smoking status: Former Smoker    Packs/day: 1.00    Years: 10.00    Types: Cigarettes    Quit date: 03/26/1990  . Smokeless tobacco: Never Used  . Alcohol use Not on file  . Drug use: Unknown  . Sexual activity: Not on file   Other Topics Concern  . Not on file   Social History Narrative   Has living  will   Wife is health care POA.   Would accept resuscitation    No tube feeds if cognitively unaware.    FAMILY HISTORY: Family History  Problem Relation Age of Onset  . Heart attack Father   . Heart disease Father   . Heart attack Brother 40  . Heart disease Brother   . Heart attack Brother   . Heart disease Brother   . Cancer Sister        breast  . Hypertension Mother   . Diabetes Sister   .  Cancer Maternal Aunt   . Cancer Maternal Aunt   . Asthma Son     Review of Systems  Constitutional: Negative.   HENT:  Negative.   Eyes: Negative.   Respiratory: Negative.   Cardiovascular: Negative.   Gastrointestinal: Negative.   Endocrine: Negative.   Genitourinary: Negative.    Musculoskeletal: Negative.   Skin:       Redness and itching to the right arm which developed several days after his last Jevtana infusion.  Neurological: Negative.   Hematological: Negative.   Psychiatric/Behavioral: Negative.       PHYSICAL EXAMINATION  ECOG PERFORMANCE STATUS: 1 - Symptomatic but completely ambulatory  Vitals:   11/21/16 0830  BP: 117/78  Pulse: 76  Resp: 18  Temp: (!) 97.5 F (36.4 C)  SpO2: 100%    Physical Exam  Constitutional: He is oriented to person, place, and time and well-developed, well-nourished, and in no distress. No distress.  HENT:  Head: Normocephalic and atraumatic.  Mouth/Throat: No oropharyngeal exudate.  Eyes: Conjunctivae are normal. Right eye exhibits no discharge. Left eye exhibits no discharge. No scleral icterus.  Neck: Normal range of motion. Neck supple.  Cardiovascular: Normal rate, regular rhythm, normal heart sounds and intact distal pulses.   Pulmonary/Chest: Effort normal and breath sounds normal. No respiratory distress. He has no wheezes. He has no rales.  Abdominal: Soft. Bowel sounds are normal. He exhibits no distension and no mass. There is no tenderness.  Musculoskeletal: Normal range of motion. He exhibits no edema.  Lymphadenopathy:    He has no cervical adenopathy.  Neurological: He is alert and oriented to person, place, and time. He exhibits normal muscle tone. Gait normal.  Skin: Skin is warm and dry. He is not diaphoretic. There is erythema.  Erythema noted to right arm. See photo below.  Psychiatric: Mood, memory, affect and judgment normal.  Vitals reviewed.     LABORATORY DATA:  CBC    Component Value  Date/Time   WBC 5.8 11/21/2016 0809   WBC 3.1 (L) 10/04/2014 0950   RBC 4.08 (L) 11/21/2016 0809   RBC 4.45 10/04/2014 0950   HGB 12.1 (L) 11/21/2016 0809   HCT 36.6 (L) 11/21/2016 0809   PLT 248 11/21/2016 0809   MCV 89.9 11/21/2016 0809   MCH 29.7 11/21/2016 0809   MCH 30.1 10/04/2014 0950   MCHC 33.0 11/21/2016 0809   MCHC 32.8 10/04/2014 0950   RDW 17.8 (H) 11/21/2016 0809   LYMPHSABS 1.2 11/21/2016 0809   MONOABS 0.4 11/21/2016 0809   EOSABS 0.1 11/21/2016 0809   BASOSABS 0.0 11/21/2016 0809    CMP     Component Value Date/Time   NA 142 11/21/2016 0809   K 4.2 11/21/2016 0809   CL 108 10/04/2014 0749   CO2 21 (L) 11/21/2016 0809   GLUCOSE 120 11/21/2016 0809   BUN 19.3 11/21/2016 0809   CREATININE 1.2 11/21/2016 0809   CALCIUM 9.9 11/21/2016  0809   PROT 7.0 11/21/2016 0809   ALBUMIN 3.7 11/21/2016 0809   AST 8 11/21/2016 0809   ALT 8 11/21/2016 0809   ALKPHOS 84 11/21/2016 0809   BILITOT 0.38 11/21/2016 0809   GFRNONAA >60 10/04/2014 0749   GFRAA >60 10/04/2014 0749   Results for Schexnayder, LEELAN RAJEWSKI (MRN [id number]) as of 11/21/2016 09:59  Ref. Range 08/08/2016 08:00 08/29/2016 10:05 09/19/2016 09:35 10/10/2016 09:49 10/31/2016 09:23  Prostate Specific Ag, Serum Latest Ref Range: 0.0 - 4.0 ng/mL 28.8 (H) 33.6 (H) 34.8 (H) 32.0 (H) 31.2 (H)    RADIOGRAPHIC STUDIES:  No results found.  ASSESSMENT and THERAPY PLAN:   Prostate cancer This is a 71 year old gentleman with advanced prostate cancer presenting with stage IV disease. His Gleason score is 4+5 = 9 with heavy volume disease involving 11 out of 12 cores of his biopsy obtained in October 2015. His PSA is 116 and his staging workup revealed lymphadenopathy in the periaortic and iliac chains. He started androgen depravation under the care of Dr. [name].  He is status post 6 cycles of chemotherapy and currently receiving androgen deprivation.   PSA increased up to 5.4 and staging workup on 07/19/2015 with bone scan is  indicating bony metastasis including activity in the sixth rib and the inferior left ilium. 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  He started on Xtandi on 07/25/2015 and have tolerated it well.   CT scan and bone scan obtained on 04/10/2016 showed some mild progression of his disease predominantly in the bone. No visceral metastasis noted.  His PSA on 08/08/2016 was 28.8 with a rapid rise and a doubling time of 6 weeks.  He is currently receiving Jevtana and has tolerated the last cycle much better without any delayed nausea or vomiting. The plan is to c with cycle 6 at the same dose and schedule as planned.   His anorexia has improved at this time. He will continue to use steroids for 5 days after each cycle of chemotherapy. Dexamethasone was refilled today.  For androgen deprivation, e will continue to receive Lupron at [facility] and we will switch his injections to the [place] in the future. His last Lupron was in Lupron in July 2018.  Delton See remains on hold for the time being until his symptoms improve.  IV access: Peripheral veins will be used for the time being. He was instructed to call us if he develops any redness or changes to his skin following this infusion. Port-A-Cath can be reinstituted if needed to in the future.  For neutropenia prophylaxis, he will receive Neulasta for this treatment and monitor his counts for subsequent cycles.  Nausea and vomiting has improved and is related to chemotherapy. He will continue dexamethasone 4 mg twice a day for days to eliminate to have delayed nausea and vomiting. He will start this after 48 hours of chemotherapy. The patient has Zofran and Compazine at home.  Follow-up: Will be in 3 weeks for the next cycle of chemotherapy..   No orders of the defined types were placed in this encounter.   All questions were answered. The patient knows to call the clinic with  any problems, questions or concerns. We can certainly see the patient much sooner if necessary.  Mikey Bussing, NP 11/21/2016

## 2016-11-21 NOTE — Patient Instructions (Signed)
[facility] Discharge Instructions for Patients Receiving Chemotherapy  Today you received the following chemotherapy agents Jevtana.   To help prevent nausea and vomiting after your treatment, we encourage you to take your nausea medication as needed.    If you develop nausea and vomiting that is not controlled by your nausea medication, call the clinic.   BELOW ARE SYMPTOMS THAT SHOULD BE REPORTED IMMEDIATELY:  *FEVER GREATER THAN 100.5 F  *CHILLS WITH OR WITHOUT FEVER  NAUSEA AND VOMITING THAT IS NOT CONTROLLED WITH YOUR NAUSEA MEDICATION  *UNUSUAL SHORTNESS OF BREATH  *UNUSUAL BRUISING OR BLEEDING  TENDERNESS IN MOUTH AND THROAT WITH OR WITHOUT PRESENCE OF ULCERS  *URINARY PROBLEMS  *BOWEL PROBLEMS  UNUSUAL RASH Items with * indicate a potential emergency and should be followed up as soon as possible.  Feel free to call the clinic you have any questions or concerns. The clinic phone number is [phone].  Please show the [place] [facility] at check-in to the Emergency Department and triage nurse.

## 2016-11-21 NOTE — Assessment & Plan Note (Signed)
This is a 71 year old gentleman with advanced prostate cancer presenting with stage IV disease. His Gleason score is 4+5 = 9 with heavy volume disease involving 11 out of 12 cores of his biopsy obtained in October 2015. His PSA is 116 and his staging workup revealed lymphadenopathy in the periaortic and iliac chains. He started androgen depravation under the care of Dr. [name].  He is status post 6 cycles of chemotherapy and currently receiving androgen deprivation.   PSA increased up to 5.4 and staging workup on 07/19/2015 with bone scan is indicating bony metastasis including activity in the sixth rib and the inferior left ilium. 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  He started on Xtandi on 07/25/2015 and have tolerated it well.   CT scan and bone scan obtained on 04/10/2016 showed some mild progression of his disease predominantly in the bone. No visceral metastasis noted.  His PSA on 08/08/2016 was 28.8 with a rapid rise and a doubling time of 6 weeks.  He is currently receiving Jevtana and has tolerated the last cycle much better without any delayed nausea or vomiting. The plan is to c with cycle 6 at the same dose and schedule as planned.   His anorexia has improved at this time. He will continue to use steroids for 5 days after each cycle of chemotherapy. Dexamethasone was refilled today.  For androgen deprivation, e will continue to receive Lupron at [facility] and we will switch his injections to the [place] in the future. His last Lupron was in Lupron in July 2018.  Delton See remains on hold for the time being until his symptoms improve.  IV access: Peripheral veins will be used for the time being. He was instructed to call us if he develops any redness or changes to his skin following this infusion. Port-A-Cath can be reinstituted if needed to in the future.  For neutropenia prophylaxis, he will receive Neulasta  for this treatment and monitor his counts for subsequent cycles.  Nausea and vomiting has improved and is related to chemotherapy. He will continue dexamethasone 4 mg twice a day for days to eliminate to have delayed nausea and vomiting. He will start this after 48 hours of chemotherapy. The patient has Zofran and Compazine at home.  Follow-up: Will be in 3 weeks for the next cycle of chemotherapy.Marland Kitchen

## 2016-11-21 NOTE — Telephone Encounter (Signed)
No 8/29 los -

## 2016-11-21 NOTE — Progress Notes (Signed)
Nutrition follow up with patient during chemotherapy for Prostate Cancer. Weight remains stable at 171.4 pounds. Patient reports his appetite is better on steroids. Denies nutrition impact symptoms. Taste alterations improved.  Nutrition Diagnosis: Unintended weight loss resolved.  Educated patient to continue adequate calories and protein for weight maintenance.  He will contact me with questions or concerns.

## 2016-11-22 ENCOUNTER — Ambulatory Visit (HOSPITAL_BASED_OUTPATIENT_CLINIC_OR_DEPARTMENT_OTHER): Payer: Medicare Other

## 2016-11-22 VITALS — BP 121/73 | Temp 97.9°F | Resp 16

## 2016-11-22 DIAGNOSIS — C7951 Secondary malignant neoplasm of bone: Secondary | ICD-10-CM | POA: Diagnosis not present

## 2016-11-22 DIAGNOSIS — C61 Malignant neoplasm of prostate: Secondary | ICD-10-CM | POA: Diagnosis present

## 2016-11-22 DIAGNOSIS — R112 Nausea with vomiting, unspecified: Secondary | ICD-10-CM

## 2016-11-22 LAB — PSA: PROSTATE SPECIFIC AG, SERUM: 26.8 ng/mL — AB (ref 0.0–4.0)

## 2016-11-22 MED ORDER — PEGFILGRASTIM INJECTION 6 MG/0.6ML ~~LOC~~
6.0000 mg | PREFILLED_SYRINGE | Freq: Once | SUBCUTANEOUS | Status: AC
  Administered 2016-11-22: 6 mg via SUBCUTANEOUS
  Filled 2016-11-22: qty 0.6

## 2016-11-22 NOTE — Patient Instructions (Signed)
Pegfilgrastim injection What is this medicine? PEGFILGRASTIM (PEG fil gra stim) is a long-acting granulocyte colony-stimulating factor that stimulates the growth of neutrophils, a type of white blood cell important in the body's fight against infection. It is used to reduce the incidence of fever and infection in patients with certain types of cancer who are receiving chemotherapy that affects the bone marrow, and to increase survival after being exposed to high doses of radiation. This medicine may be used for other purposes; ask your health care provider or pharmacist if you have questions. COMMON BRAND NAME(S): Neulasta What should I tell my health care provider before I take this medicine? They need to know if you have any of these conditions: -kidney disease -latex allergy -ongoing radiation therapy -sickle cell disease -skin reactions to acrylic adhesives (On-Body Injector only) -an unusual or allergic reaction to pegfilgrastim, filgrastim, other medicines, foods, dyes, or preservatives -pregnant or trying to get pregnant -breast-feeding How should I use this medicine? This medicine is for injection under the skin. If you get this medicine at home, you will be taught how to prepare and give the pre-filled syringe or how to use the On-body Injector. Refer to the patient Instructions for Use for detailed instructions. Use exactly as directed. Tell your healthcare provider immediately if you suspect that the On-body Injector may not have performed as intended or if you suspect the use of the On-body Injector resulted in a missed or partial dose. It is important that you put your used needles and syringes in a special sharps container. Do not put them in a trash can. If you do not have a sharps container, call your pharmacist or healthcare provider to get one. Talk to your pediatrician regarding the use of this medicine in children. While this drug may be prescribed for selected conditions,  precautions do apply. Overdosage: If you think you have taken too much of this medicine contact a poison control center or emergency room at once. NOTE: This medicine is only for you. Do not share this medicine with others. What if I miss a dose? It is important not to miss your dose. Call your doctor or health care professional if you miss your dose. If you miss a dose due to an On-body Injector failure or leakage, a new dose should be administered as soon as possible using a single prefilled syringe for manual use. What may interact with this medicine? Interactions have not been studied. Give your health care provider a list of all the medicines, herbs, non-prescription drugs, or dietary supplements you use. Also tell them if you smoke, drink alcohol, or use illegal drugs. Some items may interact with your medicine. This list may not describe all possible interactions. Give your health care provider a list of all the medicines, herbs, non-prescription drugs, or dietary supplements you use. Also tell them if you smoke, drink alcohol, or use illegal drugs. Some items may interact with your medicine. What should I watch for while using this medicine? You may need blood work done while you are taking this medicine. If you are going to need a MRI, CT scan, or other procedure, tell your doctor that you are using this medicine (On-Body Injector only). What side effects may I notice from receiving this medicine? Side effects that you should report to your doctor or health care professional as soon as possible: -allergic reactions like skin rash, itching or hives, swelling of the face, lips, or tongue -dizziness -fever -pain, redness, or irritation at site   where injected -pinpoint red spots on the skin -red or dark-brown urine -shortness of breath or breathing problems -stomach or side pain, or pain at the shoulder -swelling -tiredness -trouble passing urine or change in the amount of urine Side  effects that usually do not require medical attention (report to your doctor or health care professional if they continue or are bothersome): -bone pain -muscle pain This list may not describe all possible side effects. Call your doctor for medical advice about side effects. You may report side effects to FDA at 1-800-FDA-1088. Where should I keep my medicine? Keep out of the reach of children. Store pre-filled syringes in a refrigerator between 2 and 8 degrees C (36 and 46 degrees F). Do not freeze. Keep in carton to protect from light. Throw away this medicine if it is left out of the refrigerator for more than 48 hours. Throw away any unused medicine after the expiration date. NOTE: This sheet is a summary. It may not cover all possible information. If you have questions about this medicine, talk to your doctor, pharmacist, or health care provider.  2018 Elsevier/Gold Standard (2016-03-08 12:58:03)  

## 2016-12-12 ENCOUNTER — Ambulatory Visit (HOSPITAL_BASED_OUTPATIENT_CLINIC_OR_DEPARTMENT_OTHER): Payer: Medicare Other

## 2016-12-12 ENCOUNTER — Ambulatory Visit (HOSPITAL_BASED_OUTPATIENT_CLINIC_OR_DEPARTMENT_OTHER): Payer: Medicare Other | Admitting: Oncology

## 2016-12-12 ENCOUNTER — Other Ambulatory Visit (HOSPITAL_BASED_OUTPATIENT_CLINIC_OR_DEPARTMENT_OTHER): Payer: Medicare Other

## 2016-12-12 VITALS — BP 148/75 | HR 71 | Temp 98.0°F | Resp 18 | Ht 71.0 in | Wt 175.6 lb

## 2016-12-12 DIAGNOSIS — R112 Nausea with vomiting, unspecified: Secondary | ICD-10-CM

## 2016-12-12 DIAGNOSIS — Z5111 Encounter for antineoplastic chemotherapy: Secondary | ICD-10-CM | POA: Diagnosis present

## 2016-12-12 DIAGNOSIS — C61 Malignant neoplasm of prostate: Secondary | ICD-10-CM | POA: Diagnosis not present

## 2016-12-12 DIAGNOSIS — C7951 Secondary malignant neoplasm of bone: Secondary | ICD-10-CM

## 2016-12-12 LAB — CBC WITH DIFFERENTIAL/PLATELET
BASO%: 0.5 % (ref 0.0–2.0)
BASOS ABS: 0 10*3/uL (ref 0.0–0.1)
EOS%: 0.3 % (ref 0.0–7.0)
Eosinophils Absolute: 0 10*3/uL (ref 0.0–0.5)
HCT: 37.2 % — ABNORMAL LOW (ref 38.4–49.9)
HEMOGLOBIN: 11.8 g/dL — AB (ref 13.0–17.1)
LYMPH%: 22.1 % (ref 14.0–49.0)
MCH: 29.4 pg (ref 27.2–33.4)
MCHC: 31.7 g/dL — AB (ref 32.0–36.0)
MCV: 92.5 fL (ref 79.3–98.0)
MONO#: 0.4 10*3/uL (ref 0.1–0.9)
MONO%: 6.5 % (ref 0.0–14.0)
NEUT#: 4.3 10*3/uL (ref 1.5–6.5)
NEUT%: 70.6 % (ref 39.0–75.0)
Platelets: 228 10*3/uL (ref 140–400)
RBC: 4.02 10*6/uL — ABNORMAL LOW (ref 4.20–5.82)
RDW: 17 % — ABNORMAL HIGH (ref 11.0–14.6)
WBC: 6 10*3/uL (ref 4.0–10.3)
lymph#: 1.3 10*3/uL (ref 0.9–3.3)

## 2016-12-12 LAB — COMPREHENSIVE METABOLIC PANEL
ALBUMIN: 3.8 g/dL (ref 3.5–5.0)
ALT: <6 U/L (ref 0–55)
AST: 8 U/L (ref 5–34)
Alkaline Phosphatase: 82 U/L (ref 40–150)
Anion Gap: 9 mEq/L (ref 3–11)
BUN: 13.6 mg/dL (ref 7.0–26.0)
CHLORIDE: 112 meq/L — AB (ref 98–109)
CO2: 22 meq/L (ref 22–29)
Calcium: 9.6 mg/dL (ref 8.4–10.4)
Creatinine: 1.2 mg/dL (ref 0.7–1.3)
EGFR: 64 mL/min/{1.73_m2} — ABNORMAL LOW (ref >90)
GLUCOSE: 81 mg/dL (ref 70–140)
POTASSIUM: 4.4 meq/L (ref 3.5–5.1)
SODIUM: 142 meq/L (ref 136–145)
Total Bilirubin: 0.37 mg/dL (ref 0.20–1.20)
Total Protein: 7 g/dL (ref 6.4–8.3)

## 2016-12-12 MED ORDER — DIPHENHYDRAMINE HCL 50 MG/ML IJ SOLN
INTRAMUSCULAR | Status: AC
  Filled 2016-12-12: qty 1

## 2016-12-12 MED ORDER — SODIUM CHLORIDE 0.9 % IV SOLN
Freq: Once | INTRAVENOUS | Status: AC
  Administered 2016-12-12: 11:00:00 via INTRAVENOUS
  Filled 2016-12-12: qty 5

## 2016-12-12 MED ORDER — SODIUM CHLORIDE 0.9 % IV SOLN
Freq: Once | INTRAVENOUS | Status: AC
  Administered 2016-12-12: 11:00:00 via INTRAVENOUS

## 2016-12-12 MED ORDER — DIPHENHYDRAMINE HCL 50 MG/ML IJ SOLN
25.0000 mg | Freq: Once | INTRAMUSCULAR | Status: AC
  Administered 2016-12-12: 25 mg via INTRAVENOUS

## 2016-12-12 MED ORDER — FAMOTIDINE IN NACL 20-0.9 MG/50ML-% IV SOLN
INTRAVENOUS | Status: AC
  Filled 2016-12-12: qty 50

## 2016-12-12 MED ORDER — DEXTROSE 5 % IV SOLN
20.0000 mg/m2 | Freq: Once | INTRAVENOUS | Status: AC
  Administered 2016-12-12: 39 mg via INTRAVENOUS
  Filled 2016-12-12: qty 3.9

## 2016-12-12 MED ORDER — PALONOSETRON HCL INJECTION 0.25 MG/5ML
0.2500 mg | Freq: Once | INTRAVENOUS | Status: AC
  Administered 2016-12-12: 0.25 mg via INTRAVENOUS

## 2016-12-12 MED ORDER — PALONOSETRON HCL INJECTION 0.25 MG/5ML
INTRAVENOUS | Status: AC
  Filled 2016-12-12: qty 5

## 2016-12-12 MED ORDER — FAMOTIDINE IN NACL 20-0.9 MG/50ML-% IV SOLN
20.0000 mg | Freq: Once | INTRAVENOUS | Status: AC
  Administered 2016-12-12: 20 mg via INTRAVENOUS

## 2016-12-12 NOTE — Progress Notes (Signed)
Hematology and Oncology Follow Up Visit  [name] [id number] [birth year] 71 y.o. 12/12/2016 9:27 AM   Principle Diagnosis: 71 year old gentleman with prostate cancer diagnosed in October 2015. He had a Gleason score 4+5 = 9 and a PSA of 116. Abdominal imaging showed bulky retroperitoneal lymphadenopathy.   Prior Therapy:  Status post prostate biopsy on 01/19/2014. Systemic chemotherapy in the form of Taxotere 75 mg/m every 3 weeks with plans for total of 6 cycles. Cycle 1 given on 05/05/2014. He is status post 6 cycles of therapy concluded on 08/20/2014. Xtandi 160 mg daily started on 07/25/2015. Therapy discontinued in May 2018 because of progression of disease.   Current therapy:  Androgen deprivation in the form of Lupron. His next injection will be in November 2018.  Xgeva monthly injections started11/11/2015.  Jevtana chemotherapy started on 08/08/2016. He received 25 mg/m cycle 1. He will receive 20 mg/m in subsequent cycles. He is here for cycle 7.  Interim History:  [name] presents today for a follow-up visit with his wife. Since the last visit, he continues to tolerate chemotherapy without major complications. He took dexamethasone for 5 days after the last cycle of chemotherapy which helped his symptoms. His appetite is much improved and has gained weight. His energy and performance status is also improving. He reports no nausea or vomiting and was able to eat better. He denied any diarrhea, infusion related complications or neuropathy. He is more active and started to exercise more regularly than previously.  He does not report any headaches, blurry vision or syncope. He does not report any seizures or any neurological symptoms. He does not report any fevers, chills, weight loss or decline his energy. He does not report any chest pain, palpitation, orthopnea or leg edema. He does not report any cough, hemoptysis or hematemesis. He does not report any nausea, vomiting,  abdominal pain, constipation or diarrhea.  Remainder of his review of systems unremarkable.   Medications: I have reviewed the patient's current medications.  Current Outpatient Prescriptions  Medication Sig Dispense Refill  . aspirin 81 MG tablet Take 81 mg by mouth every morning.     Marland Kitchen dexamethasone (DECADRON) 4 MG tablet Take one tablet twice a day. Start 2 days after chemotherapy for 5 days. 30 tablet 2  . diphenhydramine-acetaminophen (TYLENOL PM) 25-500 MG TABS tablet Take 2 tablets by mouth at bedtime as needed.    . ondansetron (ZOFRAN) 4 MG tablet Take 1 tablet (4 mg total) by mouth every 8 (eight) hours as needed for nausea or vomiting. 40 tablet 1  . prochlorperazine (COMPAZINE) 10 MG tablet Take 1 tablet (10 mg total) by mouth every 6 (six) hours as needed for nausea or vomiting. 30 tablet 1  . promethazine (PHENERGAN) 25 MG tablet Take 1 tablet (25 mg total) by mouth every 6 (six) hours as needed for nausea or vomiting. 30 tablet 0  . pseudoephedrine (SUDAFED) 30 MG tablet Take 30 mg by mouth as directed.     No current facility-administered medications for this visit.    Facility-Administered Medications Ordered in Other Visits  Medication Dose Route Frequency Provider Last Rate Last Dose  . denosumab (XGEVA) injection 120 mg  120 mg Subcutaneous Once Kamdon Reisig, [name], MD         Allergies: No Known Allergies  Past Medical History, Surgical history, Social history, and Family History were reviewed and updated.   Physical Exam: Blood pressure (!) 148/75, pulse 71, temperature 98 F (36.7 C), temperature source Oral,  resp. rate 18, height 5\' 11"  (1.803 m), weight 175 lb 9.6 oz (79.7 kg), SpO2 100 %. ECOG: 1 General appearance: Well-appearing gentleman without distress. Head: Normocephalic, without obvious abnormality no oral ulcers or lesions. Neck: no adenopathy or masses. Lymph nodes: Cervical, supraclavicular, and axillary nodes normal. Heart:regular rate and rhythm,  S1, S2 normal, no murmur, click, rub or gallop Lung:chest clear, no wheezing, rales, normal symmetric air entry no dullness to percussion. Abdomin: soft, non-tender, without masses or organomegaly no rebound or guarding. EXT:no erythema, induration, or nodules   Lab Results: Lab Results  Component Value Date   WBC 6.0 12/12/2016   HGB 11.8 (L) 12/12/2016   HCT 37.2 (L) 12/12/2016   MCV 92.5 12/12/2016   PLT 228 12/12/2016     Chemistry      Component Value Date/Time   NA 142 11/21/2016 0809   K 4.2 11/21/2016 0809   CL 108 10/04/2014 0749   CO2 21 (L) 11/21/2016 0809   BUN 19.3 11/21/2016 0809   CREATININE 1.2 11/21/2016 0809      Component Value Date/Time   CALCIUM 9.9 11/21/2016 0809   ALKPHOS 84 11/21/2016 0809   AST 8 11/21/2016 0809   ALT 8 11/21/2016 0809   BILITOT 0.38 11/21/2016 0809      Results for [name], [name] (MRN [id number]) as of 12/12/2016 09:07  Ref. Range 10/10/2016 09:49 10/31/2016 09:23 11/21/2016 08:09  Prostate Specific Ag, Serum Latest Ref Range: 0.0 - 4.0 ng/mL 32.0 (H) 31.2 (H) 26.8 (H)        Impression and Plan:  71 year old gentleman with the following issues:  1. Advanced prostate cancer presenting with stage IV disease. His Gleason score is 4+5 = 9 with heavy volume disease involving 11 out of 12 cores of his biopsy obtained in October 2015. His PSA is 116 and his staging workup revealed lymphadenopathy in the periaortic and iliac chains. He started androgen depravation under the care of Dr. [name].  He is status post 6 cycles of chemotherapy.   PSA increased up to 5.4 and staging workup on 07/19/2015 with bone scan is indicating bony metastasis including activity in the sixth rib and the inferior left ilium. 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  He started on Xtandi on 07/25/2015 and have tolerated it well.   CT scan and bone scan obtained on 04/10/2016 showed some mild progression  of his disease predominantly in the bone. No visceral metastasis noted.  His PSA on 08/08/2016 was 28.8 with a rapid rise and a doubling time of 6 weeks.  He is currently receiving Jevtana which she has tolerated better at this time. His PSA continues to decline slowly and he is noting clinical improvement. Risks and benefits of continuing this treatment were discussed today and is agreeable to continue.  2. Anorexia: Improved at this time. He will continue to use steroids for 5 days after each cycle of chemotherapy.  3. Androgen depravation: His next Lupron at 20 mg will be given in November 2018.  4. Bone directed therapy: Delton See will be on hold for the time being until his symptoms improve.  5. IV access: Peripheral veins will be used for the time being. Risks and benefits of reinserting a Port-A-Cath or PICC line was discussed today. He will think about it and let me know in the immediate future.  6. Neutropenia prophylaxis: Neulasta for this treatment and monitor his counts for subsequent cycles.  7. Nausea and  vomiting: Related to chemotherapy. He will continue dexamethasone 4 mg twice a day for days to eliminate to have delayed nausea and vomiting. He will start this after 48 hours of chemotherapy.  8. Follow-up: Will be in 3 weeks for the next cycle of chemotherapy.Roxy Cedar [name] Ramroop,MD 9/19/20189:27 AM

## 2016-12-12 NOTE — Patient Instructions (Signed)
[facility] Discharge Instructions for Patients Receiving Chemotherapy  Today you received the following chemotherapy agents Jevtana.   To help prevent nausea and vomiting after your treatment, we encourage you to take your nausea medication as needed.    If you develop nausea and vomiting that is not controlled by your nausea medication, call the clinic.   BELOW ARE SYMPTOMS THAT SHOULD BE REPORTED IMMEDIATELY:  *FEVER GREATER THAN 100.5 F  *CHILLS WITH OR WITHOUT FEVER  NAUSEA AND VOMITING THAT IS NOT CONTROLLED WITH YOUR NAUSEA MEDICATION  *UNUSUAL SHORTNESS OF BREATH  *UNUSUAL BRUISING OR BLEEDING  TENDERNESS IN MOUTH AND THROAT WITH OR WITHOUT PRESENCE OF ULCERS  *URINARY PROBLEMS  *BOWEL PROBLEMS  UNUSUAL RASH Items with * indicate a potential emergency and should be followed up as soon as possible.  Feel free to call the clinic you have any questions or concerns. The clinic phone number is [phone].  Please show the [place] at check-in to the Emergency Department and triage nurse.

## 2016-12-13 ENCOUNTER — Telehealth: Payer: Self-pay | Admitting: Oncology

## 2016-12-13 ENCOUNTER — Ambulatory Visit (HOSPITAL_BASED_OUTPATIENT_CLINIC_OR_DEPARTMENT_OTHER): Payer: Medicare Other

## 2016-12-13 ENCOUNTER — Telehealth: Payer: Self-pay | Admitting: *Deleted

## 2016-12-13 VITALS — BP 131/75 | HR 72 | Temp 97.9°F | Resp 18

## 2016-12-13 DIAGNOSIS — C61 Malignant neoplasm of prostate: Secondary | ICD-10-CM | POA: Diagnosis present

## 2016-12-13 DIAGNOSIS — C7951 Secondary malignant neoplasm of bone: Secondary | ICD-10-CM | POA: Diagnosis not present

## 2016-12-13 DIAGNOSIS — Z5189 Encounter for other specified aftercare: Secondary | ICD-10-CM | POA: Diagnosis not present

## 2016-12-13 LAB — PSA: PROSTATE SPECIFIC AG, SERUM: 25.8 ng/mL — AB (ref 0.0–4.0)

## 2016-12-13 MED ORDER — PEGFILGRASTIM INJECTION 6 MG/0.6ML ~~LOC~~
6.0000 mg | PREFILLED_SYRINGE | Freq: Once | SUBCUTANEOUS | Status: AC
  Administered 2016-12-13: 6 mg via SUBCUTANEOUS
  Filled 2016-12-13: qty 0.6

## 2016-12-13 NOTE — Telephone Encounter (Signed)
As noted below by Dr. Shadad, I informed patient of his PSA level. He verbalized understanding.  

## 2016-12-13 NOTE — Telephone Encounter (Signed)
Scheduled appt per 9/19 los - patient coming in for inj today - aware appts added and will pick up and updated schedule.

## 2016-12-13 NOTE — Telephone Encounter (Signed)
-----   Message from Wyatt Portela, MD sent at 12/13/2016  8:07 AM EDT ----- Please let him know his PSA.

## 2016-12-13 NOTE — Telephone Encounter (Signed)
Gave avs and calendar for October and November  °

## 2016-12-17 ENCOUNTER — Telehealth: Payer: Self-pay | Admitting: *Deleted

## 2016-12-17 NOTE — Telephone Encounter (Signed)
Patient calling to say he is willing to have port-a-cath placed. Although, he will be unable to do this until after oct 15th.

## 2016-12-27 ENCOUNTER — Other Ambulatory Visit: Payer: Self-pay | Admitting: Oncology

## 2016-12-27 DIAGNOSIS — C61 Malignant neoplasm of prostate: Secondary | ICD-10-CM

## 2017-01-02 ENCOUNTER — Ambulatory Visit (HOSPITAL_BASED_OUTPATIENT_CLINIC_OR_DEPARTMENT_OTHER): Payer: Medicare Other | Admitting: Oncology

## 2017-01-02 ENCOUNTER — Other Ambulatory Visit (HOSPITAL_BASED_OUTPATIENT_CLINIC_OR_DEPARTMENT_OTHER): Payer: Medicare Other

## 2017-01-02 ENCOUNTER — Ambulatory Visit (HOSPITAL_BASED_OUTPATIENT_CLINIC_OR_DEPARTMENT_OTHER): Payer: Medicare Other

## 2017-01-02 VITALS — BP 130/67 | HR 74 | Temp 98.4°F | Resp 18 | Ht 71.0 in | Wt 179.4 lb

## 2017-01-02 DIAGNOSIS — Z5111 Encounter for antineoplastic chemotherapy: Secondary | ICD-10-CM

## 2017-01-02 DIAGNOSIS — C61 Malignant neoplasm of prostate: Secondary | ICD-10-CM

## 2017-01-02 DIAGNOSIS — C7951 Secondary malignant neoplasm of bone: Secondary | ICD-10-CM

## 2017-01-02 DIAGNOSIS — R112 Nausea with vomiting, unspecified: Secondary | ICD-10-CM | POA: Diagnosis not present

## 2017-01-02 LAB — CBC WITH DIFFERENTIAL/PLATELET
BASO%: 0.5 % (ref 0.0–2.0)
BASOS ABS: 0 10*3/uL (ref 0.0–0.1)
EOS%: 0.6 % (ref 0.0–7.0)
Eosinophils Absolute: 0 10*3/uL (ref 0.0–0.5)
HCT: 37.5 % — ABNORMAL LOW (ref 38.4–49.9)
HGB: 12.4 g/dL — ABNORMAL LOW (ref 13.0–17.1)
LYMPH%: 21.3 % (ref 14.0–49.0)
MCH: 29.9 pg (ref 27.2–33.4)
MCHC: 33.1 g/dL (ref 32.0–36.0)
MCV: 90.5 fL (ref 79.3–98.0)
MONO#: 0.4 10*3/uL (ref 0.1–0.9)
MONO%: 8.1 % (ref 0.0–14.0)
NEUT#: 3.3 10*3/uL (ref 1.5–6.5)
NEUT%: 69.5 % (ref 39.0–75.0)
Platelets: 227 10*3/uL (ref 140–400)
RBC: 4.14 10*6/uL — ABNORMAL LOW (ref 4.20–5.82)
RDW: 17.4 % — AB (ref 11.0–14.6)
WBC: 4.8 10*3/uL (ref 4.0–10.3)
lymph#: 1 10*3/uL (ref 0.9–3.3)

## 2017-01-02 LAB — COMPREHENSIVE METABOLIC PANEL WITH GFR
ALT: 7 U/L (ref 0–55)
AST: 9 U/L (ref 5–34)
Albumin: 3.8 g/dL (ref 3.5–5.0)
Alkaline Phosphatase: 72 U/L (ref 40–150)
Anion Gap: 9 meq/L (ref 3–11)
BUN: 20.8 mg/dL (ref 7.0–26.0)
CO2: 20 meq/L — ABNORMAL LOW (ref 22–29)
Calcium: 9.4 mg/dL (ref 8.4–10.4)
Chloride: 115 meq/L — ABNORMAL HIGH (ref 98–109)
Creatinine: 1.1 mg/dL (ref 0.7–1.3)
EGFR: >60 ml/min/1.73 m2
Glucose: 85 mg/dL (ref 70–140)
Potassium: 4.2 meq/L (ref 3.5–5.1)
Sodium: 143 meq/L (ref 136–145)
Total Bilirubin: 0.29 mg/dL (ref 0.20–1.20)
Total Protein: 6.9 g/dL (ref 6.4–8.3)

## 2017-01-02 MED ORDER — SODIUM CHLORIDE 0.9 % IV SOLN
20.0000 mg/m2 | Freq: Once | INTRAVENOUS | Status: AC
  Administered 2017-01-02: 39 mg via INTRAVENOUS
  Filled 2017-01-02: qty 3.9

## 2017-01-02 MED ORDER — FOSAPREPITANT DIMEGLUMINE INJECTION 150 MG
Freq: Once | INTRAVENOUS | Status: AC
  Administered 2017-01-02: 12:00:00 via INTRAVENOUS
  Filled 2017-01-02: qty 5

## 2017-01-02 MED ORDER — SODIUM CHLORIDE 0.9 % IV SOLN
Freq: Once | INTRAVENOUS | Status: AC
  Administered 2017-01-02: 11:00:00 via INTRAVENOUS

## 2017-01-02 MED ORDER — DIPHENHYDRAMINE HCL 50 MG/ML IJ SOLN
25.0000 mg | Freq: Once | INTRAMUSCULAR | Status: AC
  Administered 2017-01-02: 25 mg via INTRAVENOUS

## 2017-01-02 MED ORDER — FAMOTIDINE IN NACL 20-0.9 MG/50ML-% IV SOLN
INTRAVENOUS | Status: AC
  Filled 2017-01-02: qty 50

## 2017-01-02 MED ORDER — PALONOSETRON HCL INJECTION 0.25 MG/5ML
0.2500 mg | Freq: Once | INTRAVENOUS | Status: AC
  Administered 2017-01-02: 0.25 mg via INTRAVENOUS

## 2017-01-02 MED ORDER — DIPHENHYDRAMINE HCL 50 MG/ML IJ SOLN
INTRAMUSCULAR | Status: AC
  Filled 2017-01-02: qty 1

## 2017-01-02 MED ORDER — FAMOTIDINE IN NACL 20-0.9 MG/50ML-% IV SOLN
20.0000 mg | Freq: Once | INTRAVENOUS | Status: AC
  Administered 2017-01-02: 20 mg via INTRAVENOUS

## 2017-01-02 NOTE — Patient Instructions (Signed)
[facility] Discharge Instructions for Patients Receiving Chemotherapy  Today you received the following chemotherapy agents Jevtana.   To help prevent nausea and vomiting after your treatment, we encourage you to take your nausea medication as needed.    If you develop nausea and vomiting that is not controlled by your nausea medication, call the clinic.   BELOW ARE SYMPTOMS THAT SHOULD BE REPORTED IMMEDIATELY:  *FEVER GREATER THAN 100.5 F  *CHILLS WITH OR WITHOUT FEVER  NAUSEA AND VOMITING THAT IS NOT CONTROLLED WITH YOUR NAUSEA MEDICATION  *UNUSUAL SHORTNESS OF BREATH  *UNUSUAL BRUISING OR BLEEDING  TENDERNESS IN MOUTH AND THROAT WITH OR WITHOUT PRESENCE OF ULCERS  *URINARY PROBLEMS  *BOWEL PROBLEMS  UNUSUAL RASH Items with * indicate a potential emergency and should be followed up as soon as possible.  Feel free to call the clinic you have any questions or concerns. The clinic phone number is [phone].  Please show the [place] at check-in to the Emergency Department and triage nurse.

## 2017-01-02 NOTE — Progress Notes (Signed)
Hematology and Oncology Follow Up Visit  [name] [id number] [birth year] 71 y.o. 01/02/2017 9:56 AM   Principle Diagnosis: 71 year old gentleman with prostate cancer diagnosed in October 2015. He had a Gleason score 4+5 = 9 and a PSA of 116. Abdominal imaging showed bulky retroperitoneal lymphadenopathy.   Prior Therapy:  Status post prostate biopsy on 01/19/2014. Systemic chemotherapy in the form of Taxotere 75 mg/m every 3 weeks with plans for total of 6 cycles. Cycle 1 given on 05/05/2014. He is status post 6 cycles of therapy concluded on 08/20/2014. Xtandi 160 mg daily started on 07/25/2015. Therapy discontinued in May 2018 because of progression of disease.   Current therapy:  Androgen deprivation in the form of Lupron. His next injection will be in November 2018.  Xgeva monthly injections started11/11/2015.  Jevtana chemotherapy started on 08/08/2016. He received 25 mg/m cycle 1. He will receive 20 mg/m in subsequent cycles. He is here for cycle 8.  Interim History:  [name] presents today for a follow-up visit with his wife. Since the last visit, he reports no major changes in his health. He is experiencing improvement in his quality of life since the start of chemotherapy. He continues to tolerate chemotherapy without major complications with dexamethasone for 5 days after each cycle of chemotherapy which helped his symptoms.   His appetite is much improved and continues to gain weight. His energy and performance status is also improving. He is exercising more regularly at this time. He reports no nausea or vomiting and was able to eat better. He denied any diarrhea, infusion related complications or neuropathy.   He does not report any headaches, blurry vision or syncope. He does not report any seizures or any neurological symptoms. He does not report any fevers, chills, weight loss or decline his energy. He does not report any chest pain, palpitation, orthopnea or leg  edema. He does not report any cough, hemoptysis or hematemesis. He does not report any nausea, vomiting, abdominal pain, constipation or diarrhea.  Remainder of his review of systems unremarkable.   Medications: I have reviewed the patient's current medications.  Current Outpatient Prescriptions  Medication Sig Dispense Refill  . aspirin 81 MG tablet Take 81 mg by mouth every morning.     Marland Kitchen dexamethasone (DECADRON) 4 MG tablet Take one tablet twice a day. Start 2 days after chemotherapy for 5 days. 30 tablet 2  . diphenhydramine-acetaminophen (TYLENOL PM) 25-500 MG TABS tablet Take 2 tablets by mouth at bedtime as needed.    . ondansetron (ZOFRAN) 4 MG tablet Take 1 tablet (4 mg total) by mouth every 8 (eight) hours as needed for nausea or vomiting. 40 tablet 1  . prochlorperazine (COMPAZINE) 10 MG tablet Take 1 tablet (10 mg total) by mouth every 6 (six) hours as needed for nausea or vomiting. 30 tablet 1  . promethazine (PHENERGAN) 25 MG tablet Take 1 tablet (25 mg total) by mouth every 6 (six) hours as needed for nausea or vomiting. 30 tablet 0  . pseudoephedrine (SUDAFED) 30 MG tablet Take 30 mg by mouth as directed.     No current facility-administered medications for this visit.    Facility-Administered Medications Ordered in Other Visits  Medication Dose Route Frequency Provider Last Rate Last Dose  . denosumab (XGEVA) injection 120 mg  120 mg Subcutaneous Once Shadad, [name], MD         Allergies: No Known Allergies  Past Medical History, Surgical history, Social history, and Family History were reviewed  and updated.   Physical Exam: Blood pressure 130/67, pulse 74, temperature 98.4 F (36.9 C), temperature source Oral, resp. rate 18, height 5\' 11"  (1.803 m), weight 179 lb 6.4 oz (81.4 kg), SpO2 100 %. ECOG: 1 General appearance: Alert, awake gentleman without distress. Head: Normocephalic, without obvious abnormality no oral show ulcers. Neck: no adenopathy or masses. Lymph  nodes: Cervical, supraclavicular, and axillary nodes normal. Heart:regular rate and rhythm, S1, S2 normal, no murmur, click, rub or gallop Lung:chest clear, no wheezing, rales, normal symmetric air entry no dullness to percussion. Abdomin: soft, non-tender, without masses or organomegaly no shifting dullness or ascites. EXT:no erythema, induration, or nodules   Lab Results: Lab Results  Component Value Date   WBC 6.0 12/12/2016   HGB 11.8 (L) 12/12/2016   HCT 37.2 (L) 12/12/2016   MCV 92.5 12/12/2016   PLT 228 12/12/2016     Chemistry      Component Value Date/Time   NA 143 01/02/2017 0908   K 4.2 01/02/2017 0908   CL 108 10/04/2014 0749   CO2 20 (L) 01/02/2017 0908   BUN 20.8 01/02/2017 0908   CREATININE 1.1 01/02/2017 0908      Component Value Date/Time   CALCIUM 9.4 01/02/2017 0908   ALKPHOS 72 01/02/2017 0908   AST 9 01/02/2017 0908   ALT 7 01/02/2017 0908   BILITOT 0.29 01/02/2017 0908      Results for [name], [name] (MRN [id number]) as of 01/02/2017 09:45  Ref. Range 10/31/2016 09:23 11/21/2016 08:09 12/12/2016 09:01  Prostate Specific Ag, Serum Latest Ref Range: 0.0 - 4.0 ng/mL 31.2 (H) 26.8 (H) 25.8 (H)         Impression and Plan:  71 year old gentleman with the following issues:  1. Advanced prostate cancer presenting with stage IV disease. His Gleason score is 4+5 = 9 with heavy volume disease involving 11 out of 12 cores of his biopsy obtained in October 2015. His PSA is 116 and his staging workup revealed lymphadenopathy in the periaortic and iliac chains. He started androgen depravation under the care of Dr. Tresa [name].  He is status post 6 cycles of Taxotere chemotherapy.   PSA increased up to 5.4 and staging workup on 07/19/2015 with bone scan is indicating bony metastasis including activity in the sixth rib and the inferior left ilium. 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  He started on Xtandi on  07/25/2015 and have tolerated it well.   CT scan and bone scan obtained on 04/10/2016 showed some mild progression of his disease predominantly in the bone. No visceral metastasis noted.  His PSA on 08/08/2016 was 28.8 with a rapid rise and a doubling time of 6 weeks.  He is currently receiving Jevtana which she has tolerated better at this time. He is experiencing excellent clinical benefit at this time with a PSA slowly declining. Risks and benefits of continuing this medication was discussed today and he is agreeable to continue.  2. Anorexia: Improved at this time. He will continue to use steroids for 5 days after each cycle of chemotherapy. He continues to gain weight.  3. Androgen depravation: His next Lupron at 30 mg will be given in November 2018.  4. Bone directed therapy: Delton See will be on hold for the time being until his symptoms improve.  5. IV access: Port-A-Cath will be inserted next week. Risks and benefits of this procedure was discussed today including bleeding, thrombosis and infection.  6. Neutropenia prophylaxis:  Neulasta for this treatment and monitor his counts for subsequent cycles.  7. Nausea and vomiting: Related to chemotherapy. He will continue dexamethasone 4 mg twice a day for days to eliminate to have delayed nausea and vomiting. He will start this after 48 hours of chemotherapy.  8. Follow-up: Will be in 3 weeks for the next cycle of chemotherapy.Roxy Cedar Shadad,MD 10/10/20189:56 AM

## 2017-01-03 ENCOUNTER — Telehealth: Payer: Self-pay | Admitting: *Deleted

## 2017-01-03 ENCOUNTER — Ambulatory Visit (HOSPITAL_BASED_OUTPATIENT_CLINIC_OR_DEPARTMENT_OTHER): Payer: Medicare Other

## 2017-01-03 VITALS — BP 127/67 | HR 88 | Temp 97.9°F | Resp 20

## 2017-01-03 DIAGNOSIS — C61 Malignant neoplasm of prostate: Secondary | ICD-10-CM

## 2017-01-03 DIAGNOSIS — C7951 Secondary malignant neoplasm of bone: Secondary | ICD-10-CM | POA: Diagnosis not present

## 2017-01-03 LAB — PSA: Prostate Specific Ag, Serum: 23 ng/mL — ABNORMAL HIGH (ref 0.0–4.0)

## 2017-01-03 MED ORDER — PEGFILGRASTIM INJECTION 6 MG/0.6ML ~~LOC~~
6.0000 mg | PREFILLED_SYRINGE | Freq: Once | SUBCUTANEOUS | Status: AC
  Administered 2017-01-03: 6 mg via SUBCUTANEOUS
  Filled 2017-01-03: qty 0.6

## 2017-01-03 NOTE — Telephone Encounter (Signed)
Spoke with patient, per dr [name], psa down.

## 2017-01-03 NOTE — Telephone Encounter (Signed)
-----   Message from Wyatt Portela, MD sent at 01/03/2017  8:33 AM EDT ----- Please let him know his PSA is down.

## 2017-01-03 NOTE — Patient Instructions (Signed)
Pegfilgrastim injection What is this medicine? PEGFILGRASTIM (PEG fil gra stim) is a long-acting granulocyte colony-stimulating factor that stimulates the growth of neutrophils, a type of white blood cell important in the body's fight against infection. It is used to reduce the incidence of fever and infection in patients with certain types of cancer who are receiving chemotherapy that affects the bone marrow, and to increase survival after being exposed to high doses of radiation. This medicine may be used for other purposes; ask your health care provider or pharmacist if you have questions. COMMON BRAND NAME(S): Neulasta What should I tell my health care provider before I take this medicine? They need to know if you have any of these conditions: -kidney disease -latex allergy -ongoing radiation therapy -sickle cell disease -skin reactions to acrylic adhesives (On-Body Injector only) -an unusual or allergic reaction to pegfilgrastim, filgrastim, other medicines, foods, dyes, or preservatives -pregnant or trying to get pregnant -breast-feeding How should I use this medicine? This medicine is for injection under the skin. If you get this medicine at home, you will be taught how to prepare and give the pre-filled syringe or how to use the On-body Injector. Refer to the patient Instructions for Use for detailed instructions. Use exactly as directed. Tell your healthcare provider immediately if you suspect that the On-body Injector may not have performed as intended or if you suspect the use of the On-body Injector resulted in a missed or partial dose. It is important that you put your used needles and syringes in a special sharps container. Do not put them in a trash can. If you do not have a sharps container, call your pharmacist or healthcare provider to get one. Talk to your pediatrician regarding the use of this medicine in children. While this drug may be prescribed for selected conditions,  precautions do apply. Overdosage: If you think you have taken too much of this medicine contact a poison control center or emergency room at once. NOTE: This medicine is only for you. Do not share this medicine with others. What if I miss a dose? It is important not to miss your dose. Call your doctor or health care professional if you miss your dose. If you miss a dose due to an On-body Injector failure or leakage, a new dose should be administered as soon as possible using a single prefilled syringe for manual use. What may interact with this medicine? Interactions have not been studied. Give your health care provider a list of all the medicines, herbs, non-prescription drugs, or dietary supplements you use. Also tell them if you smoke, drink alcohol, or use illegal drugs. Some items may interact with your medicine. This list may not describe all possible interactions. Give your health care provider a list of all the medicines, herbs, non-prescription drugs, or dietary supplements you use. Also tell them if you smoke, drink alcohol, or use illegal drugs. Some items may interact with your medicine. What should I watch for while using this medicine? You may need blood work done while you are taking this medicine. If you are going to need a MRI, CT scan, or other procedure, tell your doctor that you are using this medicine (On-Body Injector only). What side effects may I notice from receiving this medicine? Side effects that you should report to your doctor or health care professional as soon as possible: -allergic reactions like skin rash, itching or hives, swelling of the face, lips, or tongue -dizziness -fever -pain, redness, or irritation at site   where injected -pinpoint red spots on the skin -red or dark-brown urine -shortness of breath or breathing problems -stomach or side pain, or pain at the shoulder -swelling -tiredness -trouble passing urine or change in the amount of urine Side  effects that usually do not require medical attention (report to your doctor or health care professional if they continue or are bothersome): -bone pain -muscle pain This list may not describe all possible side effects. Call your doctor for medical advice about side effects. You may report side effects to FDA at 1-800-FDA-1088. Where should I keep my medicine? Keep out of the reach of children. Store pre-filled syringes in a refrigerator between 2 and 8 degrees C (36 and 46 degrees F). Do not freeze. Keep in carton to protect from light. Throw away this medicine if it is left out of the refrigerator for more than 48 hours. Throw away any unused medicine after the expiration date. NOTE: This sheet is a summary. It may not cover all possible information. If you have questions about this medicine, talk to your doctor, pharmacist, or health care provider.  2018 Elsevier/Gold Standard (2016-03-08 12:58:03)  

## 2017-01-07 ENCOUNTER — Telehealth: Payer: Self-pay | Admitting: Oncology

## 2017-01-07 NOTE — Telephone Encounter (Signed)
Scheduled appt per 10/10 los - Patient is aware of appt added and is my chart active.

## 2017-01-08 ENCOUNTER — Other Ambulatory Visit: Payer: Self-pay | Admitting: Student

## 2017-01-09 ENCOUNTER — Encounter: Payer: Medicare Other | Admitting: Internal Medicine

## 2017-01-09 ENCOUNTER — Other Ambulatory Visit: Payer: Self-pay | Admitting: Oncology

## 2017-01-09 ENCOUNTER — Ambulatory Visit (HOSPITAL_COMMUNITY)
Admission: RE | Admit: 2017-01-09 | Discharge: 2017-01-09 | Disposition: A | Discharge status: Home / Self Care | Payer: Medicare Other | Source: Ambulatory Visit | Attending: Oncology | Admitting: Oncology

## 2017-01-09 ENCOUNTER — Encounter (HOSPITAL_COMMUNITY): Payer: Self-pay

## 2017-01-09 DIAGNOSIS — Z7982 Long term (current) use of aspirin: Secondary | ICD-10-CM | POA: Diagnosis not present

## 2017-01-09 DIAGNOSIS — Z5111 Encounter for antineoplastic chemotherapy: Secondary | ICD-10-CM | POA: Diagnosis not present

## 2017-01-09 DIAGNOSIS — C61 Malignant neoplasm of prostate: Secondary | ICD-10-CM

## 2017-01-09 DIAGNOSIS — Z452 Encounter for adjustment and management of vascular access device: Secondary | ICD-10-CM | POA: Diagnosis not present

## 2017-01-09 HISTORY — PX: IR FLUORO GUIDE PORT INSERTION RIGHT: IMG5741

## 2017-01-09 HISTORY — PX: IR US GUIDE VASC ACCESS RIGHT: IMG2390

## 2017-01-09 LAB — CBC
HCT: 39.2 % (ref 39.0–52.0)
Hemoglobin: 12.9 g/dL — ABNORMAL LOW (ref 13.0–17.0)
MCH: 29.6 pg (ref 26.0–34.0)
MCHC: 32.9 g/dL (ref 30.0–36.0)
MCV: 89.9 fL (ref 78.0–100.0)
PLATELETS: 212 10*3/uL (ref 150–400)
RBC: 4.36 MIL/uL (ref 4.22–5.81)
RDW: 17 % — ABNORMAL HIGH (ref 11.5–15.5)
WBC: 13.1 10*3/uL — ABNORMAL HIGH (ref 4.0–10.5)

## 2017-01-09 LAB — PROTIME-INR
INR: 0.9
PROTHROMBIN TIME: 12.1 s (ref 11.4–15.2)

## 2017-01-09 LAB — APTT: aPTT: 25 seconds (ref 24–36)

## 2017-01-09 MED ORDER — MIDAZOLAM HCL 2 MG/2ML IJ SOLN
INTRAMUSCULAR | Status: AC
  Filled 2017-01-09: qty 4

## 2017-01-09 MED ORDER — MIDAZOLAM HCL 2 MG/2ML IJ SOLN
INTRAMUSCULAR | Status: AC | PRN
  Administered 2017-01-09 (×2): 1 mg via INTRAVENOUS

## 2017-01-09 MED ORDER — LIDOCAINE HCL 1 % IJ SOLN
INTRAMUSCULAR | Status: AC
  Filled 2017-01-09: qty 20

## 2017-01-09 MED ORDER — FLUMAZENIL 0.5 MG/5ML IV SOLN
INTRAVENOUS | Status: DC
Start: 2017-01-09 — End: 2017-01-09
  Filled 2017-01-09: qty 5

## 2017-01-09 MED ORDER — NALOXONE HCL 0.4 MG/ML IJ SOLN
INTRAMUSCULAR | Status: DC
Start: 2017-01-09 — End: 2017-01-09
  Filled 2017-01-09: qty 1

## 2017-01-09 MED ORDER — CEFAZOLIN SODIUM-DEXTROSE 2-4 GM/100ML-% IV SOLN
INTRAVENOUS | Status: AC
  Filled 2017-01-09: qty 100

## 2017-01-09 MED ORDER — SODIUM CHLORIDE 0.9 % IV SOLN
INTRAVENOUS | Status: DC
  Administered 2017-01-09: 10:00:00 via INTRAVENOUS

## 2017-01-09 MED ORDER — FENTANYL CITRATE (PF) 100 MCG/2ML IJ SOLN
INTRAMUSCULAR | Status: AC | PRN
  Administered 2017-01-09 (×2): 50 ug via INTRAVENOUS

## 2017-01-09 MED ORDER — FENTANYL CITRATE (PF) 100 MCG/2ML IJ SOLN
INTRAMUSCULAR | Status: AC
  Filled 2017-01-09: qty 4

## 2017-01-09 MED ORDER — LIDOCAINE HCL 1 % IJ SOLN
INTRAMUSCULAR | Status: AC | PRN
  Administered 2017-01-09: 10 mL

## 2017-01-09 MED ORDER — CEFAZOLIN SODIUM-DEXTROSE 2-4 GM/100ML-% IV SOLN
2.0000 g | INTRAVENOUS | Status: AC
  Administered 2017-01-09: 2 g via INTRAVENOUS

## 2017-01-09 MED ORDER — HEPARIN SOD (PORK) LOCK FLUSH 100 UNIT/ML IV SOLN
INTRAVENOUS | Status: AC
  Filled 2017-01-09: qty 5

## 2017-01-09 NOTE — Sedation Documentation (Signed)
Patient denies pain and is resting comfortably.  

## 2017-01-09 NOTE — H&P (Signed)
Referring Physician(s): Wyatt Portela  Supervising Physician: Marybelle Killings  Patient Status:  [facility] OP  Chief Complaint:  "I'm here for another port a cath"  Subjective: Patient familiar to IR service from prior Port-A-Cath placement in 2016 followed by removal 2017. 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 He presents again today for Port-A-Cath placement for additional chemotherapy. He currently denies fever, headache, chest pain, dyspnea, cough, abdominal pain, nausea, vomiting or bleeding. He does have some intermittent low back pain. He occasionally has vertigo . Past Medical History:  Diagnosis Date  . Diverticulosis of colon   . Hx of colonic polyps   . Prostate cancer [facility])    Past Surgical History:  Procedure Laterality Date  . CARDIOVASCULAR STRESS TEST  1996   Negative  . PORTACATH PLACEMENT        Allergies: Patient has no known allergies.  Medications: Prior to Admission medications   Medication Sig Start Date End Date Taking? Authorizing Provider  aspirin 81 MG tablet Take 81 mg by mouth every morning.    Yes [provider]  dexamethasone (DECADRON) 4 MG tablet Take one tablet twice a day. Start 2 days after chemotherapy for 5 days. 11/21/16  Yes Curcio, Roselie Awkward, NP  diphenhydramine-acetaminophen (TYLENOL PM) 25-500 MG TABS tablet Take 2 tablets by mouth at bedtime as needed. 08/19/15  Yes [provider]  loratadine (CLARITIN) 10 MG tablet Take 10 mg by mouth daily as needed for allergies.   Yes [provider]  ondansetron (ZOFRAN) 4 MG tablet Take 1 tablet (4 mg total) by mouth every 8 (eight) hours as needed for nausea or vomiting. 07/26/16  Yes Wyatt Portela, MD  prochlorperazine (COMPAZINE) 10 MG tablet Take 1 tablet (10 mg total) by mouth every 6 (six) hours as needed for nausea or vomiting. 08/10/16  Yes Wyatt Portela, MD  promethazine (PHENERGAN) 25 MG tablet Take 1 tablet  (25 mg total) by mouth every 6 (six) hours as needed for nausea or vomiting. 10/10/16  Yes Wyatt Portela, MD  pseudoephedrine (SUDAFED) 30 MG tablet Take 30 mg by mouth as directed. 07/21/15   [provider]     Vital Signs: pend   Physical Exam awake, alert. Chest clear to auscultation bilaterally. Heart with regular rate and rhythm. Abdomen soft, positive bowel sounds, nontender. No lower extremity edema.  Imaging: No results found.  Labs:  CBC:  Recent Labs  11/21/16 0809 12/12/16 0901 01/02/17 0907 01/09/17 0927  WBC 5.8 6.0 4.8 13.1*  HGB 12.1* 11.8* 12.4* 12.9*  HCT 36.6* 37.2* 37.5* 39.2  PLT 248 228 227 212    COAGS: No results for input(s): INR, APTT in the last 8760 hours.  BMP:  Recent Labs  10/31/16 0923 11/21/16 0809 12/12/16 0901 01/02/17 0908  NA 143 142 142 143  K 4.5 4.2 4.4 4.2  CO2 21* 21* 22 20*  GLUCOSE 85 120 81 85  BUN 15.5 19.3 13.6 20.8  CALCIUM 9.6 9.9 9.6 9.4  CREATININE 1.1 1.2 1.2 1.1    LIVER FUNCTION TESTS:  Recent Labs  10/31/16 0923 11/21/16 0809 12/12/16 0901 01/02/17 0908  BILITOT 0.30 0.38 0.37 0.29  AST 9 8 8 9   ALT 7 8 <6 7  ALKPHOS 66 84 82 72  PROT 6.8 7.0 7.0 6.9  ALBUMIN 3.7 3.7 3.8 3.8    Assessment and Plan: Patient with history of stage IV Straight resistant prostate carcinoma  and poor venous access. He presents today for Port-A-Cath placement for chemotherapy.Risks and benefits discussed with the patient/wife  including, but not limited to bleeding, infection, pneumothorax, or fibrin sheath development and need for additional procedures.All of the patient's questions were answered, patient is agreeable to proceed. Consent signed and in chart. WBC 13.1 today- pt has had recent neulasta and decadron. He is afebrile.      Electronically Signed: D. Rowe Robert, PA-C 01/09/2017, 9:47 AM   I spent a total of 20 minutes at the the patient's bedside AND on the patient's hospital floor or  unit, greater than 50% of which was counseling/coordinating care for Port-A-Cath placement

## 2017-01-09 NOTE — Discharge Instructions (Signed)
Implanted Port Insertion, Care After °This sheet gives you information about how to care for yourself after your procedure. Your health care provider may also give you more specific instructions. If you have problems or questions, contact your health care provider. °What can I expect after the procedure? °After your procedure, it is common to have: °· Discomfort at the port insertion site. °· Bruising on the skin over the port. This should improve over 3-4 days. ° °Follow these instructions at home: °Port care °· After your port is placed, you will get a manufacturer's information card. The card has information about your port. Keep this card with you at all times. °· Take care of the port as told by your health care provider. Ask your health care provider if you or a family member can get training for taking care of the port at home. A home health care nurse may also take care of the port. °· Make sure to remember what type of port you have. °Incision care °· Follow instructions from your health care provider about how to take care of your port insertion site. Make sure you: °? Wash your hands with soap and water before you change your bandage (dressing). If soap and water are not available, use hand sanitizer. °? Change your dressing as told by your health care provider. °? Leave stitches (sutures), skin glue, or adhesive strips in place. These skin closures may need to stay in place for 2 weeks or longer. If adhesive strip edges start to loosen and curl up, you may trim the loose edges. Do not remove adhesive strips completely unless your health care provider tells you to do that. °· Check your port insertion site every day for signs of infection. Check for: °? More redness, swelling, or pain. °? More fluid or blood. °? Warmth. °? Pus or a bad smell. °General instructions °· Do not take baths, swim, or use a hot tub until your health care provider approves. °· Do not lift anything that is heavier than 10 lb (4.5  kg) for a week, or as told by your health care provider. °· Ask your health care provider when it is okay to: °? Return to work or school. °? Resume usual physical activities or sports. °· Do not drive for 24 hours if you were given a medicine to help you relax (sedative). °· Take over-the-counter and prescription medicines only as told by your health care provider. °· Wear a medical alert bracelet in case of an emergency. This will tell any health care providers that you have a port. °· Keep all follow-up visits as told by your health care provider. This is important. °Contact a health care provider if: °· You cannot flush your port with saline as directed, or you cannot draw blood from the port. °· You have a fever or chills. °· You have more redness, swelling, or pain around your port insertion site. °· You have more fluid or blood coming from your port insertion site. °· Your port insertion site feels warm to the touch. °· You have pus or a bad smell coming from the port insertion site. °Get help right away if: °· You have chest pain or shortness of breath. °· You have bleeding from your port that you cannot control. °Summary °· Take care of the port as told by your health care provider. °· Change your dressing as told by your health care provider. °· Keep all follow-up visits as told by your health care provider. °  This information is not intended to replace advice given to you by your health care provider. Make sure you discuss any questions you have with your health care provider. °Document Released: 12/31/2012 Document Revised: 02/01/2016 Document Reviewed: 02/01/2016 °Elsevier Interactive Patient Education © 2017 Elsevier Inc[employer] °Moderate Conscious Sedation, Adult, Care After °These instructions provide you with information about caring for yourself after your procedure. Your health care provider may also give you more specific instructions. Your treatment has been planned according to current medical  practices, but problems sometimes occur. Call your health care provider if you have any problems or questions after your procedure. °What can I expect after the procedure? °After your procedure, it is common: °· To feel sleepy for several hours. °· To feel clumsy and have poor balance for several hours. °· To have poor judgment for several hours. °· To vomit if you eat too soon. ° °Follow these instructions at home: °For at least 24 hours after the procedure: ° °· Do not: °? Participate in activities where you could fall or become injured. °? Drive. °? Use heavy machinery. °? Drink alcohol. °? Take sleeping pills or medicines that cause drowsiness. °? Make important decisions or sign legal documents. °? Take care of children on your own. °· Rest. °Eating and drinking °· Follow the diet recommended by your health care provider. °· If you vomit: °? Drink water, juice, or soup when you can drink without vomiting. °? Make sure you have little or no nausea before eating solid foods. °General instructions °· Have a responsible adult stay with you until you are awake and alert. °· Take over-the-counter and prescription medicines only as told by your health care provider. °· If you smoke, do not smoke without supervision. °· Keep all follow-up visits as told by your health care provider. This is important. °Contact a health care provider if: °· You keep feeling nauseous or you keep vomiting. °· You feel light-headed. °· You develop a rash. °· You have a fever. °Get help right away if: °· You have trouble breathing. °This information is not intended to replace advice given to you by your health care provider. Make sure you discuss any questions you have with your health care provider. °Document Released: 12/31/2012 Document Revised: 08/15/2015 Document Reviewed: 07/02/2015 °Elsevier Interactive Patient Education © 2018 Elsevier Inc. ° °

## 2017-01-09 NOTE — Procedures (Signed)
RIJV PAC SVC RA EBL 0 Comp 0 

## 2017-01-23 ENCOUNTER — Other Ambulatory Visit: Payer: Medicare Other

## 2017-01-23 ENCOUNTER — Telehealth: Payer: Self-pay | Admitting: Oncology

## 2017-01-23 ENCOUNTER — Ambulatory Visit: Payer: Medicare Other

## 2017-01-23 ENCOUNTER — Ambulatory Visit (HOSPITAL_BASED_OUTPATIENT_CLINIC_OR_DEPARTMENT_OTHER): Payer: Medicare Other | Admitting: Oncology

## 2017-01-23 ENCOUNTER — Ambulatory Visit (HOSPITAL_BASED_OUTPATIENT_CLINIC_OR_DEPARTMENT_OTHER): Payer: Medicare Other

## 2017-01-23 ENCOUNTER — Other Ambulatory Visit (HOSPITAL_BASED_OUTPATIENT_CLINIC_OR_DEPARTMENT_OTHER): Payer: Medicare Other

## 2017-01-23 VITALS — BP 142/79 | HR 72 | Temp 97.9°F | Resp 20 | Ht 72.0 in

## 2017-01-23 DIAGNOSIS — Z5111 Encounter for antineoplastic chemotherapy: Secondary | ICD-10-CM

## 2017-01-23 DIAGNOSIS — Z95828 Presence of other vascular implants and grafts: Secondary | ICD-10-CM

## 2017-01-23 DIAGNOSIS — R112 Nausea with vomiting, unspecified: Secondary | ICD-10-CM

## 2017-01-23 DIAGNOSIS — C61 Malignant neoplasm of prostate: Secondary | ICD-10-CM

## 2017-01-23 DIAGNOSIS — C7951 Secondary malignant neoplasm of bone: Secondary | ICD-10-CM | POA: Diagnosis not present

## 2017-01-23 LAB — CBC WITH DIFFERENTIAL/PLATELET
BASO%: 0.7 % (ref 0.0–2.0)
Basophils Absolute: 0 10*3/uL (ref 0.0–0.1)
EOS%: 0.5 % (ref 0.0–7.0)
Eosinophils Absolute: 0 10*3/uL (ref 0.0–0.5)
HCT: 38.2 % — ABNORMAL LOW (ref 38.4–49.9)
HGB: 12.6 g/dL — ABNORMAL LOW (ref 13.0–17.1)
LYMPH%: 20.6 % (ref 14.0–49.0)
MCH: 29.6 pg (ref 27.2–33.4)
MCHC: 33 g/dL (ref 32.0–36.0)
MCV: 89.8 fL (ref 79.3–98.0)
MONO#: 0.4 10*3/uL (ref 0.1–0.9)
MONO%: 7.2 % (ref 0.0–14.0)
NEUT%: 71 % (ref 39.0–75.0)
NEUTROS ABS: 4.2 10*3/uL (ref 1.5–6.5)
PLATELETS: 225 10*3/uL (ref 140–400)
RBC: 4.25 10*6/uL (ref 4.20–5.82)
RDW: 17.2 % — ABNORMAL HIGH (ref 11.0–14.6)
WBC: 5.9 10*3/uL (ref 4.0–10.3)
lymph#: 1.2 10*3/uL (ref 0.9–3.3)

## 2017-01-23 LAB — COMPREHENSIVE METABOLIC PANEL
ALT: 8 U/L (ref 0–55)
AST: 10 U/L (ref 5–34)
Albumin: 3.9 g/dL (ref 3.5–5.0)
Alkaline Phosphatase: 78 U/L (ref 40–150)
Anion Gap: 7 mEq/L (ref 3–11)
BUN: 15.4 mg/dL (ref 7.0–26.0)
CALCIUM: 9.3 mg/dL (ref 8.4–10.4)
CHLORIDE: 113 meq/L — AB (ref 98–109)
CO2: 21 meq/L — AB (ref 22–29)
CREATININE: 1 mg/dL (ref 0.7–1.3)
EGFR: >60 mL/min/{1.73_m2} (ref >60)
GLUCOSE: 91 mg/dL (ref 70–140)
POTASSIUM: 4.3 meq/L (ref 3.5–5.1)
SODIUM: 142 meq/L (ref 136–145)
Total Bilirubin: 0.33 mg/dL (ref 0.20–1.20)
Total Protein: 7 g/dL (ref 6.4–8.3)

## 2017-01-23 MED ORDER — SODIUM CHLORIDE 0.9% FLUSH
10.0000 mL | INTRAVENOUS | Status: DC | PRN
  Administered 2017-01-23: 10 mL via INTRAVENOUS
  Filled 2017-01-23: qty 10

## 2017-01-23 MED ORDER — HEPARIN SOD (PORK) LOCK FLUSH 100 UNIT/ML IV SOLN
500.0000 [IU] | Freq: Once | INTRAVENOUS | Status: AC | PRN
  Administered 2017-01-23: 500 [IU]
  Filled 2017-01-23: qty 5

## 2017-01-23 MED ORDER — PALONOSETRON HCL INJECTION 0.25 MG/5ML
0.2500 mg | Freq: Once | INTRAVENOUS | Status: AC
  Administered 2017-01-23: 0.25 mg via INTRAVENOUS

## 2017-01-23 MED ORDER — SODIUM CHLORIDE 0.9% FLUSH
10.0000 mL | INTRAVENOUS | Status: DC | PRN
  Administered 2017-01-23: 10 mL
  Filled 2017-01-23: qty 10

## 2017-01-23 MED ORDER — DIPHENHYDRAMINE HCL 50 MG/ML IJ SOLN
INTRAMUSCULAR | Status: AC
  Filled 2017-01-23: qty 1

## 2017-01-23 MED ORDER — DIPHENHYDRAMINE HCL 50 MG/ML IJ SOLN
25.0000 mg | Freq: Once | INTRAMUSCULAR | Status: AC
  Administered 2017-01-23: 25 mg via INTRAVENOUS

## 2017-01-23 MED ORDER — SODIUM CHLORIDE 0.9 % IV SOLN
20.0000 mg/m2 | Freq: Once | INTRAVENOUS | Status: AC
  Administered 2017-01-23: 39 mg via INTRAVENOUS
  Filled 2017-01-23: qty 3.9

## 2017-01-23 MED ORDER — LIDOCAINE-PRILOCAINE 2.5-2.5 % EX CREA: 1.0000 "application " | TOPICAL_CREAM | CUTANEOUS | 3 refills | Status: DC | PRN

## 2017-01-23 MED ORDER — FAMOTIDINE IN NACL 20-0.9 MG/50ML-% IV SOLN
INTRAVENOUS | Status: AC
  Filled 2017-01-23: qty 50

## 2017-01-23 MED ORDER — PALONOSETRON HCL INJECTION 0.25 MG/5ML
INTRAVENOUS | Status: AC
  Filled 2017-01-23: qty 5

## 2017-01-23 MED ORDER — HEPARIN SOD (PORK) LOCK FLUSH 100 UNIT/ML IV SOLN
500.0000 [IU] | Freq: Once | INTRAVENOUS | Status: DC
  Filled 2017-01-23: qty 5

## 2017-01-23 MED ORDER — FAMOTIDINE IN NACL 20-0.9 MG/50ML-% IV SOLN
20.0000 mg | Freq: Once | INTRAVENOUS | Status: AC
  Administered 2017-01-23: 20 mg via INTRAVENOUS

## 2017-01-23 MED ORDER — PROMETHAZINE HCL 25 MG PO TABS: 25.0000 mg | ORAL_TABLET | Freq: Four times a day (QID) | ORAL | 3 refills | Status: DC | PRN

## 2017-01-23 MED ORDER — DEXAMETHASONE 4 MG PO TABS: ORAL_TABLET | ORAL | 3 refills | Status: DC

## 2017-01-23 MED ORDER — SODIUM CHLORIDE 0.9 % IV SOLN
Freq: Once | INTRAVENOUS | Status: AC
  Administered 2017-01-23: 11:00:00 via INTRAVENOUS

## 2017-01-23 MED ORDER — SODIUM CHLORIDE 0.9 % IV SOLN
Freq: Once | INTRAVENOUS | Status: AC
  Administered 2017-01-23: 11:00:00 via INTRAVENOUS
  Filled 2017-01-23: qty 5

## 2017-01-23 NOTE — Patient Instructions (Signed)
[facility] Discharge Instructions for Patients Receiving Chemotherapy  Today you received the following chemotherapy agents: Jevtana  To help prevent nausea and vomiting after your treatment, we encourage you to take your nausea medication as directed.   If you develop nausea and vomiting that is not controlled by your nausea medication, call the clinic.   BELOW ARE SYMPTOMS THAT SHOULD BE REPORTED IMMEDIATELY:  *FEVER GREATER THAN 100.5 F  *CHILLS WITH OR WITHOUT FEVER  NAUSEA AND VOMITING THAT IS NOT CONTROLLED WITH YOUR NAUSEA MEDICATION  *UNUSUAL SHORTNESS OF BREATH  *UNUSUAL BRUISING OR BLEEDING  TENDERNESS IN MOUTH AND THROAT WITH OR WITHOUT PRESENCE OF ULCERS  *URINARY PROBLEMS  *BOWEL PROBLEMS  UNUSUAL RASH Items with * indicate a potential emergency and should be followed up as soon as possible.  Feel free to call the clinic should you have any questions or concerns. The clinic phone number is [phone].  Please show the CHEMO ALERT CARD at check-in to the Emergency Department and triage nurse.   

## 2017-01-23 NOTE — Progress Notes (Signed)
Hematology and Oncology Follow Up Visit  ORRY SIGL [id number] 10-15-1945 71 y.o. 01/23/2017 9:52 AM   Principle Diagnosis: 71 year old gentleman with prostate cancer diagnosed in October 2015. He had a Gleason score 4+5 = 9 and a PSA of 116. Abdominal imaging showed bulky retroperitoneal lymphadenopathy.   Prior Therapy:  Status post prostate biopsy on 01/19/2014. Systemic chemotherapy in the form of Taxotere 75 mg/m every 3 weeks with plans for total of 6 cycles. Cycle 1 given on 05/05/2014. He is status post 6 cycles of therapy concluded on 08/20/2014. Xtandi 160 mg daily started on 07/25/2015. Therapy discontinued in May 2018 because of progression of disease.   Current therapy:  Androgen deprivation in the form of Lupron. His next injection will be in November 2018.  Xgeva monthly injections started11/11/2015.  Jevtana chemotherapy started on 08/08/2016. He received 25 mg/m cycle 1. He will receive 20 mg/m in subsequent cycles. He is here for cycle 9.   Interim History:  Mr. Chiou presents today for a follow-up visit with his wife. Since the last visit, he continues to do reasonably well without complications. He continues to tolerate chemotherapy without any new complaints.  He takes dexamethasone for 5 days after each cycle of chemotherapy which helped his symptoms.  His appetite is excellent and performance status is improving. He is exercising more regularly at this time. He reports no nausea or vomiting and was able to eat better. He denied any diarrhea, infusion related complications or neuropathy.  He denies any bone pain or pathologic fractures.  He does not report any headaches, blurry vision or syncope. He does not report any seizures or any neurological symptoms. He does not report any fevers, chills, weight loss or decline his energy. He does not report any chest pain, palpitation, orthopnea or leg edema. He does not report any cough, hemoptysis or hematemesis. He does  not report any nausea, vomiting, abdominal pain, constipation or diarrhea.  Remainder of his review of systems unremarkable.   Medications: I have reviewed the patient's current medications.  Current Outpatient Prescriptions  Medication Sig Dispense Refill  . aspirin 81 MG tablet Take 81 mg by mouth every morning.     Marland Kitchen dexamethasone (DECADRON) 4 MG tablet Take one tablet twice a day. Start 2 days after chemotherapy for 5 days. 30 tablet 3  . diphenhydramine-acetaminophen (TYLENOL PM) 25-500 MG TABS tablet Take 2 tablets by mouth at bedtime as needed.    . lidocaine-prilocaine (EMLA) cream Apply 1 application topically as needed. Apply to portacath on the day of chemotherapy. 30 g 3  . loratadine (CLARITIN) 10 MG tablet Take 10 mg by mouth daily as needed for allergies.    Marland Kitchen ondansetron (ZOFRAN) 4 MG tablet Take 1 tablet (4 mg total) by mouth every 8 (eight) hours as needed for nausea or vomiting. 40 tablet 1  . prochlorperazine (COMPAZINE) 10 MG tablet Take 1 tablet (10 mg total) by mouth every 6 (six) hours as needed for nausea or vomiting. 30 tablet 1  . promethazine (PHENERGAN) 25 MG tablet Take 1 tablet (25 mg total) by mouth every 6 (six) hours as needed for nausea or vomiting. 30 tablet 3  . pseudoephedrine (SUDAFED) 30 MG tablet Take 30 mg by mouth as directed.     No current facility-administered medications for this visit.    Facility-Administered Medications Ordered in Other Visits  Medication Dose Route Frequency Provider Last Rate Last Dose  . denosumab (XGEVA) injection 120 mg  120 mg Subcutaneous Once  Wyatt Portela, MD         Allergies: No Known Allergies  Past Medical History, Surgical history, Social history, and Family History were reviewed and updated.   Physical Exam: Blood pressure (!) 142/79, pulse 72, temperature 97.9 F (36.6 C), temperature source Oral, resp. rate 20, height 6' (1.829 m), SpO2 99 %. ECOG: 1 General appearance: Well-appearing gentleman  without distress. Head: Normocephalic, without obvious abnormality no oral thrush. Neck: no adenopathy  Lymph nodes: Cervical, supraclavicular, and axillary nodes normal. Heart:regular rate and rhythm, S1, S2 normal, no murmur, click, rub or gallop Lung:chest clear, no wheezing, rales, normal symmetric air entry no dullness to percussion. Abdomin: soft, non-tender, without masses or organomegaly no rebound or guarding. EXT:no erythema, induration, or nodules   Lab Results: Lab Results  Component Value Date   WBC 5.9 01/23/2017   HGB 12.6 (L) 01/23/2017   HCT 38.2 (L) 01/23/2017   MCV 89.8 01/23/2017   PLT 225 01/23/2017     Chemistry      Component Value Date/Time   NA 143 01/02/2017 0908   K 4.2 01/02/2017 0908   CL 108 10/04/2014 0749   CO2 20 (L) 01/02/2017 0908   BUN 20.8 01/02/2017 0908   CREATININE 1.1 01/02/2017 0908      Component Value Date/Time   CALCIUM 9.4 01/02/2017 0908   ALKPHOS 72 01/02/2017 0908   AST 9 01/02/2017 0908   ALT 7 01/02/2017 0908   BILITOT 0.29 01/02/2017 0908       Results for Shimada, JAIEL SARACENO (MRN [id number]) as of 01/23/2017 09:19  Ref. Range 11/21/2016 08:09 12/12/2016 09:01 01/02/2017 09:07  Prostate Specific Ag, Serum Latest Ref Range: 0.0 - 4.0 ng/mL 26.8 (H) 25.8 (H) 23.0 (H)         Impression and Plan:  71 year old gentleman with the following issues:  1. Advanced prostate cancer presenting with stage IV disease. His Gleason score is 4+5 = 9 with heavy volume disease involving 11 out of 12 cores of his biopsy obtained in October 2015. His PSA is 116 and his staging workup revealed lymphadenopathy in the periaortic and iliac chains. He started androgen depravation under the care of Dr. [name].  He is status post 6 cycles of Taxotere chemotherapy.   PSA increased up to 5.4 and staging workup on 07/19/2015 with bone scan is indicating bony metastasis including activity in the sixth rib and the inferior left ilium. 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  He started on Xtandi on 07/25/2015 until May 2018.  His PSA on 08/08/2016 was 28.8 with a rapid rise and a doubling time of 6 weeks.  He is currently receiving Jevtana which he has tolerated better at this time.  His PSA continues to show reasonable response to therapy with his continuous clinical improvement.  2. Anorexia: Improved at this time. He will continue to use steroids for 5 days after each cycle of chemotherapy.  No complications noted.  3. Androgen depravation: His next Lupron at 30 mg will be given in November 2018.  4. Bone directed therapy: Delton See will be on hold for the time being.  5. IV access: Port-A-Cath was inserted without complications and will be used for each cycle of chemotherapy.  6. Neutropenia prophylaxis: Neulasta for this treatment and monitor his counts for subsequent cycles.  7. Nausea and vomiting: Related to chemotherapy. He will continue dexamethasone 4 mg twice a day for days to eliminate to have  delayed nausea and vomiting. He will start this after 48 hours of chemotherapy.   8. Follow-up: Will be in 3 weeks for the next cycle of chemotherapy.Roxy Cedar Timiyah Romito,MD 10/31/20189:52 AM

## 2017-01-23 NOTE — Telephone Encounter (Signed)
Gave avs and calendar for November and December  °

## 2017-01-23 NOTE — Patient Instructions (Signed)
Implanted Port Home Guide An implanted port is a type of central line that is placed under the skin. Central lines are used to provide IV access when treatment or nutrition needs to be given through a person's veins. Implanted ports are used for long-term IV access. An implanted port may be placed because:  You need IV medicine that would be irritating to the small veins in your hands or arms.  You need long-term IV medicines, such as antibiotics.  You need IV nutrition for a long period.  You need frequent blood draws for lab tests.  You need dialysis.  Implanted ports are usually placed in the chest area, but they can also be placed in the upper arm, the abdomen, or the leg. An implanted port has two main parts:  Reservoir. The reservoir is round and will appear as a small, raised area under your skin. The reservoir is the part where a needle is inserted to give medicines or draw blood.  Catheter. The catheter is a thin, flexible tube that extends from the reservoir. The catheter is placed into a large vein. Medicine that is inserted into the reservoir goes into the catheter and then into the vein.  How will I care for my incision site? Do not get the incision site wet. Bathe or shower as directed by your health care provider. How is my port accessed? Special steps must be taken to access the port:  Before the port is accessed, a numbing cream can be placed on the skin. This helps numb the skin over the port site.  Your health care provider uses a sterile technique to access the port. ? Your health care provider must put on a mask and sterile gloves. ? The skin over your port is cleaned carefully with an antiseptic and allowed to dry. ? The port is gently pinched between sterile gloves, and a needle is inserted into the port.  Only "non-coring" port needles should be used to access the port. Once the port is accessed, a blood return should be checked. This helps ensure that the port  is in the vein and is not clogged.  If your port needs to remain accessed for a constant infusion, a clear (transparent) bandage will be placed over the needle site. The bandage and needle will need to be changed every week, or as directed by your health care provider.  Keep the bandage covering the needle clean and dry. Do not get it wet. Follow your health care provider's instructions on how to take a shower or bath while the port is accessed.  If your port does not need to stay accessed, no bandage is needed over the port.  What is flushing? Flushing helps keep the port from getting clogged. Follow your health care provider's instructions on how and when to flush the port. Ports are usually flushed with saline solution or a medicine called heparin. The need for flushing will depend on how the port is used.  If the port is used for intermittent medicines or blood draws, the port will need to be flushed: ? After medicines have been given. ? After blood has been drawn. ? As part of routine maintenance.  If a constant infusion is running, the port may not need to be flushed.  How long will my port stay implanted? The port can stay in for as long as your health care provider thinks it is needed. When it is time for the port to come out, surgery will be   done to remove it. The procedure is similar to the one performed when the port was put in. When should I seek immediate medical care? When you have an implanted port, you should seek immediate medical care if:  You notice a bad smell coming from the incision site.  You have swelling, redness, or drainage at the incision site.  You have more swelling or pain at the port site or the surrounding area.  You have a fever that is not controlled with medicine.  This information is not intended to replace advice given to you by your health care provider. Make sure you discuss any questions you have with your health care provider. Document  Released: 03/12/2005 Document Revised: 08/18/2015 Document Reviewed: 11/17/2012 Elsevier Interactive Patient Education  2017 Elsevier Inc.  

## 2017-01-24 ENCOUNTER — Encounter: Payer: Self-pay | Admitting: Oncology

## 2017-01-24 ENCOUNTER — Telehealth: Payer: Self-pay | Admitting: *Deleted

## 2017-01-24 ENCOUNTER — Ambulatory Visit (HOSPITAL_BASED_OUTPATIENT_CLINIC_OR_DEPARTMENT_OTHER): Payer: Medicare Other

## 2017-01-24 VITALS — BP 137/82 | HR 88 | Temp 96.7°F | Resp 18

## 2017-01-24 DIAGNOSIS — C7951 Secondary malignant neoplasm of bone: Secondary | ICD-10-CM

## 2017-01-24 DIAGNOSIS — Z5189 Encounter for other specified aftercare: Secondary | ICD-10-CM | POA: Diagnosis not present

## 2017-01-24 DIAGNOSIS — C61 Malignant neoplasm of prostate: Secondary | ICD-10-CM | POA: Diagnosis present

## 2017-01-24 LAB — PSA: Prostate Specific Ag, Serum: 23.3 ng/mL — ABNORMAL HIGH (ref 0.0–4.0)

## 2017-01-24 MED ORDER — PEGFILGRASTIM INJECTION 6 MG/0.6ML ~~LOC~~
6.0000 mg | PREFILLED_SYRINGE | Freq: Once | SUBCUTANEOUS | Status: AC
  Administered 2017-01-24: 6 mg via SUBCUTANEOUS
  Filled 2017-01-24: qty 0.6

## 2017-01-24 NOTE — Patient Instructions (Signed)
Pegfilgrastim injection What is this medicine? PEGFILGRASTIM (PEG fil gra stim) is a long-acting granulocyte colony-stimulating factor that stimulates the growth of neutrophils, a type of white blood cell important in the body's fight against infection. It is used to reduce the incidence of fever and infection in patients with certain types of cancer who are receiving chemotherapy that affects the bone marrow, and to increase survival after being exposed to high doses of radiation. This medicine may be used for other purposes; ask your health care provider or pharmacist if you have questions. COMMON BRAND NAME(S): Neulasta What should I tell my health care provider before I take this medicine? They need to know if you have any of these conditions: -kidney disease -latex allergy -ongoing radiation therapy -sickle cell disease -skin reactions to acrylic adhesives (On-Body Injector only) -an unusual or allergic reaction to pegfilgrastim, filgrastim, other medicines, foods, dyes, or preservatives -pregnant or trying to get pregnant -breast-feeding How should I use this medicine? This medicine is for injection under the skin. If you get this medicine at home, you will be taught how to prepare and give the pre-filled syringe or how to use the On-body Injector. Refer to the patient Instructions for Use for detailed instructions. Use exactly as directed. Tell your healthcare provider immediately if you suspect that the On-body Injector may not have performed as intended or if you suspect the use of the On-body Injector resulted in a missed or partial dose. It is important that you put your used needles and syringes in a special sharps container. Do not put them in a trash can. If you do not have a sharps container, call your pharmacist or healthcare provider to get one. Talk to your pediatrician regarding the use of this medicine in children. While this drug may be prescribed for selected conditions,  precautions do apply. Overdosage: If you think you have taken too much of this medicine contact a poison control center or emergency room at once. NOTE: This medicine is only for you. Do not share this medicine with others. What if I miss a dose? It is important not to miss your dose. Call your doctor or health care professional if you miss your dose. If you miss a dose due to an On-body Injector failure or leakage, a new dose should be administered as soon as possible using a single prefilled syringe for manual use. What may interact with this medicine? Interactions have not been studied. Give your health care provider a list of all the medicines, herbs, non-prescription drugs, or dietary supplements you use. Also tell them if you smoke, drink alcohol, or use illegal drugs. Some items may interact with your medicine. This list may not describe all possible interactions. Give your health care provider a list of all the medicines, herbs, non-prescription drugs, or dietary supplements you use. Also tell them if you smoke, drink alcohol, or use illegal drugs. Some items may interact with your medicine. What should I watch for while using this medicine? You may need blood work done while you are taking this medicine. If you are going to need a MRI, CT scan, or other procedure, tell your doctor that you are using this medicine (On-Body Injector only). What side effects may I notice from receiving this medicine? Side effects that you should report to your doctor or health care professional as soon as possible: -allergic reactions like skin rash, itching or hives, swelling of the face, lips, or tongue -dizziness -fever -pain, redness, or irritation at site   where injected -pinpoint red spots on the skin -red or dark-brown urine -shortness of breath or breathing problems -stomach or side pain, or pain at the shoulder -swelling -tiredness -trouble passing urine or change in the amount of urine Side  effects that usually do not require medical attention (report to your doctor or health care professional if they continue or are bothersome): -bone pain -muscle pain This list may not describe all possible side effects. Call your doctor for medical advice about side effects. You may report side effects to FDA at 1-800-FDA-1088. Where should I keep my medicine? Keep out of the reach of children. Store pre-filled syringes in a refrigerator between 2 and 8 degrees C (36 and 46 degrees F). Do not freeze. Keep in carton to protect from light. Throw away this medicine if it is left out of the refrigerator for more than 48 hours. Throw away any unused medicine after the expiration date. NOTE: This sheet is a summary. It may not cover all possible information. If you have questions about this medicine, talk to your doctor, pharmacist, or health care provider.  2018 Elsevier/Gold Standard (2016-03-08 12:58:03)  

## 2017-01-24 NOTE — Telephone Encounter (Signed)
-----   Message from Wyatt Portela, MD sent at 01/24/2017  8:45 AM EDT ----- Please let him know his PSA is unchanged.

## 2017-01-24 NOTE — Telephone Encounter (Signed)
As noted below by Dr. Shadad, I informed patient of his PSA level. Patient verbalized understanding. 

## 2017-01-24 NOTE — Progress Notes (Signed)
Received approval for Lidocaine-Prilocaine cream via fax from [employer].  Faxed copy to [facility] in [place]. Fax received ok per confirmation sheet.

## 2017-01-24 NOTE — Progress Notes (Signed)
PA approved for Lidocaine-Prilocaine cream per Cover My Meds.  [name] Key: [id number] - PA Case ID: [id number] - Rx #: [id number] Need help? Call us at [phone]  Outcome  Approvedtoday  Your request has been approved  DrugLidocaine-Prilocaine 2.5-2.5% EX CREA  [facility] Electronic PA Form  Original Claim [id number] PA [place]  PA approved 10/26/16-04/24/17

## 2017-01-24 NOTE — Progress Notes (Signed)
Received PA request from [facility] via [place] [employer].  Submitted via Cover My Meds.  [name] (Key: [id number])   Your information has been submitted to [place] [employer]. [employer] will review the request and will issue a decision, typically within 1-3 days from your submission. You can check the updated outcome later by reopening this request. If [employer] has not responded in 1-3 days or if you have any questions about your ePA request, please contact [place] [employer] Part [employer] at [phone]. If you think there may be a problem with your PA request, use our live chat feature at the bottom right.

## 2017-02-05 DIAGNOSIS — Z23 Encounter for immunization: Secondary | ICD-10-CM | POA: Diagnosis not present

## 2017-02-13 ENCOUNTER — Ambulatory Visit: Payer: Medicare Other

## 2017-02-13 ENCOUNTER — Ambulatory Visit: Payer: Medicare Other | Admitting: Oncology

## 2017-02-13 ENCOUNTER — Other Ambulatory Visit: Payer: Medicare Other

## 2017-02-15 ENCOUNTER — Ambulatory Visit: Payer: Medicare Other

## 2017-02-19 ENCOUNTER — Ambulatory Visit: Payer: Medicare Other

## 2017-02-19 ENCOUNTER — Ambulatory Visit (HOSPITAL_BASED_OUTPATIENT_CLINIC_OR_DEPARTMENT_OTHER): Payer: Medicare Other | Admitting: Oncology

## 2017-02-19 ENCOUNTER — Ambulatory Visit (HOSPITAL_BASED_OUTPATIENT_CLINIC_OR_DEPARTMENT_OTHER): Payer: Medicare Other

## 2017-02-19 ENCOUNTER — Other Ambulatory Visit (HOSPITAL_BASED_OUTPATIENT_CLINIC_OR_DEPARTMENT_OTHER): Payer: Medicare Other

## 2017-02-19 ENCOUNTER — Telehealth: Payer: Self-pay | Admitting: Oncology

## 2017-02-19 VITALS — BP 144/81 | HR 78 | Temp 97.8°F | Resp 18 | Ht 72.0 in | Wt 180.5 lb

## 2017-02-19 DIAGNOSIS — Z95828 Presence of other vascular implants and grafts: Secondary | ICD-10-CM

## 2017-02-19 DIAGNOSIS — C7951 Secondary malignant neoplasm of bone: Secondary | ICD-10-CM

## 2017-02-19 DIAGNOSIS — Z5111 Encounter for antineoplastic chemotherapy: Secondary | ICD-10-CM | POA: Diagnosis not present

## 2017-02-19 DIAGNOSIS — R112 Nausea with vomiting, unspecified: Secondary | ICD-10-CM

## 2017-02-19 DIAGNOSIS — C61 Malignant neoplasm of prostate: Secondary | ICD-10-CM

## 2017-02-19 LAB — COMPREHENSIVE METABOLIC PANEL
ALT: 8 U/L (ref 0–55)
ANION GAP: 9 meq/L (ref 3–11)
AST: 9 U/L (ref 5–34)
Albumin: 3.8 g/dL (ref 3.5–5.0)
Alkaline Phosphatase: 73 U/L (ref 40–150)
BUN: 16.4 mg/dL (ref 7.0–26.0)
CHLORIDE: 112 meq/L — AB (ref 98–109)
CO2: 22 mEq/L (ref 22–29)
Calcium: 10 mg/dL (ref 8.4–10.4)
Creatinine: 1.1 mg/dL (ref 0.7–1.3)
Glucose: 87 mg/dl (ref 70–140)
POTASSIUM: 4.1 meq/L (ref 3.5–5.1)
SODIUM: 143 meq/L (ref 136–145)
Total Bilirubin: 0.35 mg/dL (ref 0.20–1.20)
Total Protein: 7 g/dL (ref 6.4–8.3)

## 2017-02-19 LAB — CBC WITH DIFFERENTIAL/PLATELET
BASO%: 0.5 % (ref 0.0–2.0)
BASOS ABS: 0 10*3/uL (ref 0.0–0.1)
EOS ABS: 0.1 10*3/uL (ref 0.0–0.5)
EOS%: 1.8 % (ref 0.0–7.0)
HEMATOCRIT: 39.4 % (ref 38.4–49.9)
HEMOGLOBIN: 12.6 g/dL — AB (ref 13.0–17.1)
LYMPH#: 1.2 10*3/uL (ref 0.9–3.3)
LYMPH%: 28 % (ref 14.0–49.0)
MCH: 29.4 pg (ref 27.2–33.4)
MCHC: 32 g/dL (ref 32.0–36.0)
MCV: 92.1 fL (ref 79.3–98.0)
MONO#: 0.5 10*3/uL (ref 0.1–0.9)
MONO%: 10.5 % (ref 0.0–14.0)
NEUT#: 2.6 10*3/uL (ref 1.5–6.5)
NEUT%: 59.2 % (ref 39.0–75.0)
PLATELETS: 222 10*3/uL (ref 140–400)
RBC: 4.28 10*6/uL (ref 4.20–5.82)
RDW: 15.7 % — AB (ref 11.0–14.6)
WBC: 4.4 10*3/uL (ref 4.0–10.3)

## 2017-02-19 MED ORDER — FAMOTIDINE IN NACL 20-0.9 MG/50ML-% IV SOLN
20.0000 mg | Freq: Once | INTRAVENOUS | Status: AC
  Administered 2017-02-19: 20 mg via INTRAVENOUS

## 2017-02-19 MED ORDER — DIPHENHYDRAMINE HCL 50 MG/ML IJ SOLN
INTRAMUSCULAR | Status: AC
Start: 2017-02-19 — End: 2017-02-19
  Filled 2017-02-19: qty 1

## 2017-02-19 MED ORDER — SODIUM CHLORIDE 0.9% FLUSH
10.0000 mL | INTRAVENOUS | Status: DC | PRN
  Administered 2017-02-19: 10 mL
  Filled 2017-02-19: qty 10

## 2017-02-19 MED ORDER — HEPARIN SOD (PORK) LOCK FLUSH 100 UNIT/ML IV SOLN
500.0000 [IU] | Freq: Once | INTRAVENOUS | Status: AC | PRN
  Administered 2017-02-19: 500 [IU]
  Filled 2017-02-19: qty 5

## 2017-02-19 MED ORDER — FAMOTIDINE IN NACL 20-0.9 MG/50ML-% IV SOLN
INTRAVENOUS | Status: AC
Start: 2017-02-19 — End: 2017-02-19
  Filled 2017-02-19: qty 50

## 2017-02-19 MED ORDER — SODIUM CHLORIDE 0.9 % IV SOLN
Freq: Once | INTRAVENOUS | Status: AC
  Administered 2017-02-19: 10:00:00 via INTRAVENOUS

## 2017-02-19 MED ORDER — PALONOSETRON HCL INJECTION 0.25 MG/5ML
0.2500 mg | Freq: Once | INTRAVENOUS | Status: AC
  Administered 2017-02-19: 0.25 mg via INTRAVENOUS

## 2017-02-19 MED ORDER — DIPHENHYDRAMINE HCL 50 MG/ML IJ SOLN
25.0000 mg | Freq: Once | INTRAMUSCULAR | Status: AC
  Administered 2017-02-19: 25 mg via INTRAVENOUS

## 2017-02-19 MED ORDER — HEPARIN SOD (PORK) LOCK FLUSH 100 UNIT/ML IV SOLN
500.0000 [IU] | Freq: Once | INTRAVENOUS | Status: DC
  Filled 2017-02-19: qty 5

## 2017-02-19 MED ORDER — SODIUM CHLORIDE 0.9 % IV SOLN
20.0000 mg/m2 | Freq: Once | INTRAVENOUS | Status: AC
  Administered 2017-02-19: 39 mg via INTRAVENOUS
  Filled 2017-02-19: qty 3.9

## 2017-02-19 MED ORDER — PALONOSETRON HCL INJECTION 0.25 MG/5ML
INTRAVENOUS | Status: AC
  Filled 2017-02-19: qty 5

## 2017-02-19 MED ORDER — SODIUM CHLORIDE 0.9% FLUSH
10.0000 mL | INTRAVENOUS | Status: DC | PRN
  Administered 2017-02-19: 10 mL via INTRAVENOUS
  Filled 2017-02-19: qty 10

## 2017-02-19 MED ORDER — LEUPROLIDE ACETATE (4 MONTH) 30 MG IM KIT
30.0000 mg | PACK | Freq: Once | INTRAMUSCULAR | Status: AC
Start: 2017-02-19 — End: 2017-02-19
  Administered 2017-02-19: 30 mg via INTRAMUSCULAR
  Filled 2017-02-19: qty 30

## 2017-02-19 MED ORDER — ALTEPLASE 2 MG IJ SOLR
2.0000 mg | Freq: Once | INTRAMUSCULAR | Status: DC | PRN
  Filled 2017-02-19: qty 2

## 2017-02-19 MED ORDER — SODIUM CHLORIDE 0.9 % IV SOLN
Freq: Once | INTRAVENOUS | Status: AC
  Administered 2017-02-19: 11:00:00 via INTRAVENOUS
  Filled 2017-02-19: qty 5

## 2017-02-19 NOTE — Progress Notes (Signed)
Hematology and Oncology Follow Up Visit  [name] [id number] 1945-07-28 71 y.o. 02/19/2017 9:20 AM   Principle Diagnosis: 71 year old gentleman with prostate cancer diagnosed in October 2015. He had a Gleason score 4+5 = 9 and a PSA of 116. Abdominal imaging showed bulky retroperitoneal lymphadenopathy.   Prior Therapy:  Status post prostate biopsy on 01/19/2014. Systemic chemotherapy in the form of Taxotere 75 mg/m every 3 weeks with plans for total of 6 cycles. Cycle 1 given on 05/05/2014. He is status post 6 cycles of therapy concluded on 08/20/2014. Xtandi 160 mg daily started on 07/25/2015. Therapy discontinued in May 2018 because of progression of disease.   Current therapy:  Androgen deprivation in the form of Lupron. His next injection will be in November 2018.  Xgeva monthly injections started11/11/2015.  Jevtana chemotherapy started on 08/08/2016. He received 25 mg/m cycle 1. He will receive 20 mg/m in subsequent cycles. He is here for cycle 10.   Interim History:  [name] presents today for a follow-up visit with his wife. Since the last visit, he reports feeling well.   He continues to tolerate chemotherapy without any new complaints.  He denies any nausea, vomiting or decline in his appetite.  He takes dexamethasone for 5 days after each cycle of chemotherapy which helped his symptoms.  He has gained weight since the last visit.  He is exercising more regularly at this time.  His stamina and performance status continues to improve.  He does not report any headaches, blurry vision or syncope. He does not report any seizures or any neurological symptoms. He does not report any fevers, chills, weight loss or decline his energy. He does not report any chest pain, palpitation, orthopnea or leg edema. He does not report any cough, hemoptysis or hematemesis. He does not report any nausea, vomiting, abdominal pain, constipation or diarrhea.  Remainder of his review of systems  unremarkable.   Medications: I have reviewed the patient's current medications.  Current Outpatient Medications  Medication Sig Dispense Refill  . aspirin 81 MG tablet Take 81 mg by mouth every morning.     Marland Kitchen dexamethasone (DECADRON) 4 MG tablet Take one tablet twice a day. Start 2 days after chemotherapy for 5 days. 30 tablet 3  . diphenhydramine-acetaminophen (TYLENOL PM) 25-500 MG TABS tablet Take 2 tablets by mouth at bedtime as needed.    . lidocaine-prilocaine (EMLA) cream Apply 1 application topically as needed. Apply to portacath on the day of chemotherapy. 30 g 3  . loratadine (CLARITIN) 10 MG tablet Take 10 mg by mouth daily as needed for allergies.    Marland Kitchen ondansetron (ZOFRAN) 4 MG tablet Take 1 tablet (4 mg total) by mouth every 8 (eight) hours as needed for nausea or vomiting. 40 tablet 1  . prochlorperazine (COMPAZINE) 10 MG tablet Take 1 tablet (10 mg total) by mouth every 6 (six) hours as needed for nausea or vomiting. 30 tablet 1  . promethazine (PHENERGAN) 25 MG tablet Take 1 tablet (25 mg total) by mouth every 6 (six) hours as needed for nausea or vomiting. 30 tablet 3  . pseudoephedrine (SUDAFED) 30 MG tablet Take 30 mg by mouth as directed.     No current facility-administered medications for this visit.    Facility-Administered Medications Ordered in Other Visits  Medication Dose Route Frequency Provider Last Rate Last Dose  . denosumab (XGEVA) injection 120 mg  120 mg Subcutaneous Once [name] Portela, MD  Allergies: No Known Allergies  Past Medical History, Surgical history, Social history, and Family History were reviewed and updated.   Physical Exam: Blood pressure (!) 144/81, pulse 78, temperature 97.8 F (36.6 C), temperature source Oral, resp. rate 18, height 6' (1.829 m), weight 180 lb 8 oz (81.9 kg), SpO2 100 %. ECOG: 1 General appearance: Alert, awake gentleman without distress. Head: Normocephalic, without obvious abnormality no oral ulcers or  lesions. Neck: no adenopathy or masses. Lymph nodes: Cervical, supraclavicular, and axillary nodes normal. Heart:regular rate and rhythm, S1, S2 normal, no murmur, click, rub or gallop Lung:chest clear, no wheezing, rales, normal symmetric air entry no dullness to percussion. Abdomin: soft, non-tender, without masses or organomegaly no shifting dullness or ascites. EXT:no erythema, induration, or nodules   Lab Results: Lab Results  Component Value Date   WBC 4.4 02/19/2017   HGB 12.6 (L) 02/19/2017   HCT 39.4 02/19/2017   MCV 92.1 02/19/2017   PLT 222 02/19/2017     Chemistry      Component Value Date/Time   NA 142 01/23/2017 0840   K 4.3 01/23/2017 0840   CL 108 10/04/2014 0749   CO2 21 (L) 01/23/2017 0840   BUN 15.4 01/23/2017 0840   CREATININE 1.0 01/23/2017 0840      Component Value Date/Time   CALCIUM 9.3 01/23/2017 0840   ALKPHOS 78 01/23/2017 0840   AST 10 01/23/2017 0840   ALT 8 01/23/2017 0840   BILITOT 0.33 01/23/2017 0840        Results for [name], [name] (MRN [id number]) as of 02/19/2017 09:05  Ref. Range 12/12/2016 09:01 01/02/2017 09:07 01/23/2017 08:39  Prostate Specific Ag, Serum Latest Ref Range: 0.0 - 4.0 ng/mL 25.8 (H) 23.0 (H) 23.3 (H)       Impression and Plan:  71 year old gentleman with the following issues:  1. Advanced prostate cancer presenting with stage IV disease. His Gleason score is 4+5 = 9 with heavy volume disease involving 11 out of 12 cores of his biopsy obtained in October 2015. His PSA is 116 and his staging workup revealed lymphadenopathy in the periaortic and iliac chains. He started androgen depravation under the care of Dr. [name].  He is status post 6 cycles of Taxotere chemotherapy.   PSA increased up to 5.4 and staging workup on 07/19/2015 with bone scan is indicating bony metastasis including activity in the sixth rib and the inferior left ilium. These findings along with a rise in his PSA, indicate castration resistant  prostate cancer.  He started on Xtandi on 07/25/2015 until May 2018.  His PSA on 08/08/2016 was 28.8 with a rapid rise and a doubling time of 6 weeks.  He is currently receiving Jevtana which he has tolerated better at this time.  His quality of life and performance status continued to improve on this therapy.  His PSA is also stabilized.  Risks and benefits of continuing this treatment was discussed today.  He agreeable to continue at this time.  2. Anorexia: Improved at this time. He will continue to use steroids for 5 days after each cycle of chemotherapy.  His weight continues to improve.  3. Androgen depravation: His next Lupron at 30 mg will be given on November, 27th 2018.  4. Bone directed therapy: Delton See will be on hold for the time being.  5. IV access: Port-A-Cath in use without complications.  6. Neutropenia prophylaxis: Neulasta for this treatment and monitor his counts for subsequent cycles.  7. Nausea and vomiting: Related to  chemotherapy.  No longer an issue at this time.  8. Follow-up: Will be in 3 weeks for the next cycle of chemotherapy.Roxy Cedar Jefferie Holston,MD 11/27/20189:20 AM

## 2017-02-19 NOTE — Patient Instructions (Signed)
[facility]cer Center Discharge Instructions for Patients Receiving Chemotherapy  Today you received the following chemotherapy agents: Jevtana  To help prevent nausea and vomiting after your treatment, we encourage you to take your nausea medication as directed.   If you develop nausea and vomiting that is not controlled by your nausea medication, call the clinic.   BELOW ARE SYMPTOMS THAT SHOULD BE REPORTED IMMEDIATELY:  *FEVER GREATER THAN 100.5 F  *CHILLS WITH OR WITHOUT FEVER  NAUSEA AND VOMITING THAT IS NOT CONTROLLED WITH YOUR NAUSEA MEDICATION  *UNUSUAL SHORTNESS OF BREATH  *UNUSUAL BRUISING OR BLEEDING  TENDERNESS IN MOUTH AND THROAT WITH OR WITHOUT PRESENCE OF ULCERS  *URINARY PROBLEMS  *BOWEL PROBLEMS  UNUSUAL RASH Items with * indicate a potential emergency and should be followed up as soon as possible.  Feel free to call the clinic should you have any questions or concerns. The clinic phone number is [phone].  Please show the CHEMO ALERT CARD at check-in to the Emergency Department and triage nurse.   

## 2017-02-19 NOTE — Telephone Encounter (Signed)
Gave avs and calendar for December and January 2019 °

## 2017-02-20 ENCOUNTER — Telehealth: Payer: Self-pay | Admitting: *Deleted

## 2017-02-20 ENCOUNTER — Ambulatory Visit (HOSPITAL_BASED_OUTPATIENT_CLINIC_OR_DEPARTMENT_OTHER): Payer: Medicare Other

## 2017-02-20 VITALS — BP 145/85 | HR 87 | Temp 97.8°F | Resp 20

## 2017-02-20 DIAGNOSIS — C7951 Secondary malignant neoplasm of bone: Secondary | ICD-10-CM

## 2017-02-20 DIAGNOSIS — C61 Malignant neoplasm of prostate: Secondary | ICD-10-CM

## 2017-02-20 DIAGNOSIS — Z5189 Encounter for other specified aftercare: Secondary | ICD-10-CM

## 2017-02-20 LAB — PSA: PROSTATE SPECIFIC AG, SERUM: 25.1 ng/mL — AB (ref 0.0–4.0)

## 2017-02-20 MED ORDER — PEGFILGRASTIM INJECTION 6 MG/0.6ML ~~LOC~~
6.0000 mg | PREFILLED_SYRINGE | Freq: Once | SUBCUTANEOUS | Status: AC
  Administered 2017-02-20: 6 mg via SUBCUTANEOUS
  Filled 2017-02-20: qty 0.6

## 2017-02-20 NOTE — Telephone Encounter (Signed)
As noted below by Dr. Shadad, I informed patient of his PSA level. He verbalized understanding.  

## 2017-02-20 NOTE — Telephone Encounter (Signed)
-----   Message from Wyatt Portela, MD sent at 02/20/2017  8:38 AM EST ----- Please let him know his PSA has little change.

## 2017-02-20 NOTE — Patient Instructions (Signed)
Pegfilgrastim injection What is this medicine? PEGFILGRASTIM (PEG fil gra stim) is a long-acting granulocyte colony-stimulating factor that stimulates the growth of neutrophils, a type of white blood cell important in the body's fight against infection. It is used to reduce the incidence of fever and infection in patients with certain types of cancer who are receiving chemotherapy that affects the bone marrow, and to increase survival after being exposed to high doses of radiation. This medicine may be used for other purposes; ask your health care provider or pharmacist if you have questions. COMMON BRAND NAME(S): Neulasta What should I tell my health care provider before I take this medicine? They need to know if you have any of these conditions: -kidney disease -latex allergy -ongoing radiation therapy -sickle cell disease -skin reactions to acrylic adhesives (On-Body Injector only) -an unusual or allergic reaction to pegfilgrastim, filgrastim, other medicines, foods, dyes, or preservatives -pregnant or trying to get pregnant -breast-feeding How should I use this medicine? This medicine is for injection under the skin. If you get this medicine at home, you will be taught how to prepare and give the pre-filled syringe or how to use the On-body Injector. Refer to the patient Instructions for Use for detailed instructions. Use exactly as directed. Tell your healthcare provider immediately if you suspect that the On-body Injector may not have performed as intended or if you suspect the use of the On-body Injector resulted in a missed or partial dose. It is important that you put your used needles and syringes in a special sharps container. Do not put them in a trash can. If you do not have a sharps container, call your pharmacist or healthcare provider to get one. Talk to your pediatrician regarding the use of this medicine in children. While this drug may be prescribed for selected conditions,  precautions do apply. Overdosage: If you think you have taken too much of this medicine contact a poison control center or emergency room at once. NOTE: This medicine is only for you. Do not share this medicine with others. What if I miss a dose? It is important not to miss your dose. Call your doctor or health care professional if you miss your dose. If you miss a dose due to an On-body Injector failure or leakage, a new dose should be administered as soon as possible using a single prefilled syringe for manual use. What may interact with this medicine? Interactions have not been studied. Give your health care provider a list of all the medicines, herbs, non-prescription drugs, or dietary supplements you use. Also tell them if you smoke, drink alcohol, or use illegal drugs. Some items may interact with your medicine. This list may not describe all possible interactions. Give your health care provider a list of all the medicines, herbs, non-prescription drugs, or dietary supplements you use. Also tell them if you smoke, drink alcohol, or use illegal drugs. Some items may interact with your medicine. What should I watch for while using this medicine? You may need blood work done while you are taking this medicine. If you are going to need a MRI, CT scan, or other procedure, tell your doctor that you are using this medicine (On-Body Injector only). What side effects may I notice from receiving this medicine? Side effects that you should report to your doctor or health care professional as soon as possible: -allergic reactions like skin rash, itching or hives, swelling of the face, lips, or tongue -dizziness -fever -pain, redness, or irritation at site   where injected -pinpoint red spots on the skin -red or dark-brown urine -shortness of breath or breathing problems -stomach or side pain, or pain at the shoulder -swelling -tiredness -trouble passing urine or change in the amount of urine Side  effects that usually do not require medical attention (report to your doctor or health care professional if they continue or are bothersome): -bone pain -muscle pain This list may not describe all possible side effects. Call your doctor for medical advice about side effects. You may report side effects to FDA at 1-800-FDA-1088. Where should I keep my medicine? Keep out of the reach of children. Store pre-filled syringes in a refrigerator between 2 and 8 degrees C (36 and 46 degrees F). Do not freeze. Keep in carton to protect from light. Throw away this medicine if it is left out of the refrigerator for more than 48 hours. Throw away any unused medicine after the expiration date. NOTE: This sheet is a summary. It may not cover all possible information. If you have questions about this medicine, talk to your doctor, pharmacist, or health care provider.  2018 Elsevier/Gold Standard (2016-03-08 12:58:03)  

## 2017-03-13 ENCOUNTER — Ambulatory Visit (HOSPITAL_BASED_OUTPATIENT_CLINIC_OR_DEPARTMENT_OTHER): Payer: Medicare Other | Admitting: Oncology

## 2017-03-13 ENCOUNTER — Ambulatory Visit (HOSPITAL_BASED_OUTPATIENT_CLINIC_OR_DEPARTMENT_OTHER): Payer: Medicare Other

## 2017-03-13 ENCOUNTER — Ambulatory Visit: Payer: Medicare Other

## 2017-03-13 ENCOUNTER — Other Ambulatory Visit (HOSPITAL_BASED_OUTPATIENT_CLINIC_OR_DEPARTMENT_OTHER): Payer: Medicare Other

## 2017-03-13 ENCOUNTER — Other Ambulatory Visit: Payer: Self-pay | Admitting: *Deleted

## 2017-03-13 VITALS — BP 159/79 | HR 65 | Temp 97.6°F | Resp 18 | Ht 72.0 in | Wt 184.1 lb

## 2017-03-13 DIAGNOSIS — Z95828 Presence of other vascular implants and grafts: Secondary | ICD-10-CM

## 2017-03-13 DIAGNOSIS — C61 Malignant neoplasm of prostate: Secondary | ICD-10-CM

## 2017-03-13 DIAGNOSIS — Z5111 Encounter for antineoplastic chemotherapy: Secondary | ICD-10-CM | POA: Diagnosis not present

## 2017-03-13 DIAGNOSIS — C7951 Secondary malignant neoplasm of bone: Secondary | ICD-10-CM | POA: Diagnosis not present

## 2017-03-13 LAB — COMPREHENSIVE METABOLIC PANEL
ALBUMIN: 3.7 g/dL (ref 3.5–5.0)
ALK PHOS: 67 U/L (ref 40–150)
ALT: 7 U/L (ref 0–55)
AST: 9 U/L (ref 5–34)
Anion Gap: 6 mEq/L (ref 3–11)
BILIRUBIN TOTAL: 0.32 mg/dL (ref 0.20–1.20)
BUN: 14.9 mg/dL (ref 7.0–26.0)
CALCIUM: 9.1 mg/dL (ref 8.4–10.4)
CO2: 22 mEq/L (ref 22–29)
Chloride: 114 mEq/L — ABNORMAL HIGH (ref 98–109)
Creatinine: 1 mg/dL (ref 0.7–1.3)
GLUCOSE: 89 mg/dL (ref 70–140)
Potassium: 4.4 mEq/L (ref 3.5–5.1)
SODIUM: 142 meq/L (ref 136–145)
TOTAL PROTEIN: 6.6 g/dL (ref 6.4–8.3)

## 2017-03-13 LAB — CBC WITH DIFFERENTIAL/PLATELET
BASO%: 0.4 % (ref 0.0–2.0)
Basophils Absolute: 0 10*3/uL (ref 0.0–0.1)
EOS%: 0.2 % (ref 0.0–7.0)
Eosinophils Absolute: 0 10*3/uL (ref 0.0–0.5)
HCT: 37.8 % — ABNORMAL LOW (ref 38.4–49.9)
HGB: 12.2 g/dL — ABNORMAL LOW (ref 13.0–17.1)
LYMPH#: 1.2 10*3/uL (ref 0.9–3.3)
LYMPH%: 27.1 % (ref 14.0–49.0)
MCH: 29.7 pg (ref 27.2–33.4)
MCHC: 32.3 g/dL (ref 32.0–36.0)
MCV: 92 fL (ref 79.3–98.0)
MONO#: 0.4 10*3/uL (ref 0.1–0.9)
MONO%: 8.9 % (ref 0.0–14.0)
NEUT%: 63.4 % (ref 39.0–75.0)
NEUTROS ABS: 2.8 10*3/uL (ref 1.5–6.5)
Platelets: 207 10*3/uL (ref 140–400)
RBC: 4.11 10*6/uL — AB (ref 4.20–5.82)
RDW: 15.4 % — ABNORMAL HIGH (ref 11.0–14.6)
WBC: 4.5 10*3/uL (ref 4.0–10.3)

## 2017-03-13 MED ORDER — FAMOTIDINE IN NACL 20-0.9 MG/50ML-% IV SOLN
INTRAVENOUS | Status: AC
  Filled 2017-03-13: qty 50

## 2017-03-13 MED ORDER — SODIUM CHLORIDE 0.9 % IV SOLN
Freq: Once | INTRAVENOUS | Status: AC
  Administered 2017-03-13: 11:00:00 via INTRAVENOUS
  Filled 2017-03-13: qty 5

## 2017-03-13 MED ORDER — PROMETHAZINE HCL 25 MG PO TABS: 25.0000 mg | ORAL_TABLET | Freq: Four times a day (QID) | ORAL | 3 refills | Status: DC | PRN

## 2017-03-13 MED ORDER — SODIUM CHLORIDE 0.9% FLUSH
10.0000 mL | INTRAVENOUS | Status: DC | PRN
  Administered 2017-03-13: 10 mL
  Filled 2017-03-13: qty 10

## 2017-03-13 MED ORDER — ALTEPLASE 2 MG IJ SOLR
2.0000 mg | Freq: Once | INTRAMUSCULAR | Status: DC | PRN
  Filled 2017-03-13: qty 2

## 2017-03-13 MED ORDER — HEPARIN SOD (PORK) LOCK FLUSH 100 UNIT/ML IV SOLN
500.0000 [IU] | Freq: Once | INTRAVENOUS | Status: DC
  Filled 2017-03-13: qty 5

## 2017-03-13 MED ORDER — SODIUM CHLORIDE 0.9% FLUSH
10.0000 mL | INTRAVENOUS | Status: DC | PRN
  Administered 2017-03-13: 10 mL via INTRAVENOUS
  Filled 2017-03-13: qty 10

## 2017-03-13 MED ORDER — PALONOSETRON HCL INJECTION 0.25 MG/5ML
0.2500 mg | Freq: Once | INTRAVENOUS | Status: AC
Start: 2017-03-13 — End: 2017-03-13
  Administered 2017-03-13: 0.25 mg via INTRAVENOUS

## 2017-03-13 MED ORDER — DIPHENHYDRAMINE HCL 50 MG/ML IJ SOLN
INTRAMUSCULAR | Status: AC
  Filled 2017-03-13: qty 1

## 2017-03-13 MED ORDER — SODIUM CHLORIDE 0.9 % IV SOLN
20.0000 mg/m2 | Freq: Once | INTRAVENOUS | Status: AC
  Administered 2017-03-13: 39 mg via INTRAVENOUS
  Filled 2017-03-13: qty 3.9

## 2017-03-13 MED ORDER — SODIUM CHLORIDE 0.9 % IV SOLN
Freq: Once | INTRAVENOUS | Status: AC
  Administered 2017-03-13: 11:00:00 via INTRAVENOUS

## 2017-03-13 MED ORDER — PALONOSETRON HCL INJECTION 0.25 MG/5ML
INTRAVENOUS | Status: AC
  Filled 2017-03-13: qty 5

## 2017-03-13 MED ORDER — FAMOTIDINE IN NACL 20-0.9 MG/50ML-% IV SOLN
20.0000 mg | Freq: Once | INTRAVENOUS | Status: AC
  Administered 2017-03-13: 20 mg via INTRAVENOUS

## 2017-03-13 MED ORDER — HEPARIN SOD (PORK) LOCK FLUSH 100 UNIT/ML IV SOLN
500.0000 [IU] | Freq: Once | INTRAVENOUS | Status: AC | PRN
Start: 2017-03-13 — End: 2017-03-13
  Administered 2017-03-13: 500 [IU]
  Filled 2017-03-13: qty 5

## 2017-03-13 MED ORDER — DIPHENHYDRAMINE HCL 50 MG/ML IJ SOLN
25.0000 mg | Freq: Once | INTRAMUSCULAR | Status: AC
  Administered 2017-03-13: 25 mg via INTRAVENOUS

## 2017-03-13 NOTE — Progress Notes (Signed)
Hematology and Oncology Follow Up Visit  [name] [id number] 09/27/1945 71 y.o. 03/13/2017 10:08 AM   Principle Diagnosis: 71 year old gentleman with prostate cancer diagnosed in October 2015. He had a Gleason score 4+5 = 9 and a PSA of 116. Abdominal imaging showed bulky retroperitoneal lymphadenopathy.   Prior Therapy:  Status post prostate biopsy on 01/19/2014. Systemic chemotherapy in the form of Taxotere 75 mg/m every 3 weeks with plans for total of 6 cycles. Cycle 1 given on 05/05/2014. He is status post 6 cycles of therapy concluded on 08/20/2014. Xtandi 160 mg daily started on 07/25/2015. Therapy discontinued in May 2018 because of progression of disease.   Current therapy:  Androgen deprivation in the form of Lupron. His next injection will be in November 2018.  Xgeva monthly injections started11/11/2015.  Jevtana chemotherapy started on 08/08/2016. He received 25 mg/m cycle 1. He will receive 20 mg/m in subsequent cycles. He is here for cycle 11.   Interim History:  [name] presents today for a follow-up visit with his wife. Since the last visit, he reports no changes in his health and overall improvement in his quality of life.   He continues to tolerate chemotherapy without any new side effects.  He denies any vomiting or decline in his appetite.  He takes dexamethasone for 5 days after each cycle of chemotherapy which helped his symptoms.  He reports mild nausea that is manageable with Phenergan.  He has gained weight since the last visit with improved and eating habits.  He is exercising more regularly at this time.  His stamina has improved and he is increasing his exercise ability.  He does not report any headaches, blurry vision or syncope. He does not report any seizures or any neurological symptoms. He does not report any fevers, chills, weight loss or decline his energy. He does not report any chest pain, palpitation, orthopnea or leg edema. He does not report  any cough, hemoptysis or hematemesis. He does not report any nausea, vomiting, abdominal pain, constipation or diarrhea.  Remainder of his review of systems unremarkable.   Medications: I have reviewed the patient's current medications.  Current Outpatient Medications  Medication Sig Dispense Refill  . aspirin 81 MG tablet Take 81 mg by mouth every morning.     Marland Kitchen dexamethasone (DECADRON) 4 MG tablet Take one tablet twice a day. Start 2 days after chemotherapy for 5 days. 30 tablet 3  . diphenhydramine-acetaminophen (TYLENOL PM) 25-500 MG TABS tablet Take 2 tablets by mouth at bedtime as needed.    . lidocaine-prilocaine (EMLA) cream Apply 1 application topically as needed. Apply to portacath on the day of chemotherapy. 30 g 3  . loratadine (CLARITIN) 10 MG tablet Take 10 mg by mouth daily as needed for allergies.    Marland Kitchen ondansetron (ZOFRAN) 4 MG tablet Take 1 tablet (4 mg total) by mouth every 8 (eight) hours as needed for nausea or vomiting. 40 tablet 1  . prochlorperazine (COMPAZINE) 10 MG tablet Take 1 tablet (10 mg total) by mouth every 6 (six) hours as needed for nausea or vomiting. 30 tablet 1  . pseudoephedrine (SUDAFED) 30 MG tablet Take 30 mg by mouth as directed.    . promethazine (PHENERGAN) 25 MG tablet Take 1 tablet (25 mg total) by mouth every 6 (six) hours as needed for nausea or vomiting. 30 tablet 3   No current facility-administered medications for this visit.    Facility-Administered Medications Ordered in Other Visits  Medication Dose Route Frequency  Provider Last Rate Last Dose  . denosumab (XGEVA) injection 120 mg  120 mg Subcutaneous Once [name], [name] Dad, MD         Allergies: No Known Allergies  Past Medical History, Surgical history, Social history, and Family History were reviewed and updated.   Physical Exam: Blood pressure (!) 159/79, pulse 65, temperature 97.6 F (36.4 C), temperature source Oral, resp. rate 18, height 6' (1.829 m), weight 184 lb 1.6 oz (83.5  kg), SpO2 100 %. ECOG: 1 General appearance: Well-appearing gentleman appeared without distress. Head: Normocephalic, without obvious abnormality no oral ulcers or thrush. Neck: no adenopathy or masses. Lymph nodes: Cervical, supraclavicular, and axillary nodes normal. Heart:regular rate and rhythm, S1, S2 normal, no murmur, click, rub or gallop Lung:chest clear, no wheezing, rales, normal symmetric air entry no dullness to percussion. Abdomin: soft, non-tender, without masses or organomegaly no rebound or guarding. EXT:no erythema, induration, or nodules   Lab Results: Lab Results  Component Value Date   WBC 4.5 03/13/2017   HGB 12.2 (L) 03/13/2017   HCT 37.8 (L) 03/13/2017   MCV 92.0 03/13/2017   PLT 207 03/13/2017     Chemistry      Component Value Date/Time   NA 143 02/19/2017 0833   K 4.1 02/19/2017 0833   CL 108 10/04/2014 0749   CO2 22 02/19/2017 0833   BUN 16.4 02/19/2017 0833   CREATININE 1.1 02/19/2017 0833      Component Value Date/Time   CALCIUM 10.0 02/19/2017 0833   ALKPHOS 73 02/19/2017 0833   AST 9 02/19/2017 0833   ALT 8 02/19/2017 0833   BILITOT 0.35 02/19/2017 0833        Results for [name], [name] (MRN [id number]) as of 03/13/2017 09:51  Ref. Range 01/23/2017 08:39 02/19/2017 08:33  Prostate Specific Ag, Serum Latest Ref Range: 0.0 - 4.0 ng/mL 23.3 (H) 25.1 (H)      Impression and Plan:  71 year old gentleman with the following issues:  1. Advanced prostate cancer presenting with stage IV disease. His Gleason score is 4+5 = 9 with heavy volume disease involving 11 out of 12 cores of his biopsy obtained in October 2015. His PSA is 116 and his staging workup revealed lymphadenopathy in the periaortic and iliac chains. He started androgen depravation under the care of Dr. Tresa [name].  He is status post 6 cycles of Taxotere chemotherapy.   PSA increased up to 5.4 and staging workup on 07/19/2015 with bone scan is indicating bony metastasis including  activity in the sixth rib and the inferior left ilium. 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  He started on Xtandi on 07/25/2015 until May 2018.  His PSA on 08/08/2016 was 28.8 with a rapid rise and a doubling time of 6 weeks.  Christinia Gully was started in May 2018 and continues to tolerate it well.  His quality of life as well as performance status continued to improve on this treatment.  His PSA is overall been generally stable.  His  Plan is to proceed with chemotherapy today without any dose reduction or delay and he will receive his next cycle in January 2019.  We will consider treatment break after that time.  2. Anorexia: Improved at this time. He will continue to use steroids for 5 days after each cycle of chemotherapy.  His weight has increased since the last visit.  3. Androgen depravation: His next Lupron at 30 mg was given on November, 27th 2018.  4. Bone directed therapy: Delton See will be on hold for the time being based on patient's preference.   5. IV access: Port-A-Cath in place and remain in use without issues.  6. Neutropenia prophylaxis: Neulasta for this treatment and monitor his counts for subsequent cycles.  7. Nausea and vomiting: Related to chemotherapy.  He is no longer reporting any vomiting and Phenergan has been effective in eliminating any nausea.  8. Follow-up: Will be in 3 weeks for the next cycle of chemotherapy.Roxy Cedar Marquitta Persichetti,MD 12/19/201810:08 AM

## 2017-03-13 NOTE — Patient Instructions (Signed)
[facility] Discharge Instructions for Patients Receiving Chemotherapy  Today you received the following chemotherapy agents Cabazitaxel  To help prevent nausea and vomiting after your treatment, we encourage you to take your nausea medication as directed  If you develop nausea and vomiting that is not controlled by your nausea medication, call the clinic.   BELOW ARE SYMPTOMS THAT SHOULD BE REPORTED IMMEDIATELY:  *FEVER GREATER THAN 100.5 F  *CHILLS WITH OR WITHOUT FEVER  NAUSEA AND VOMITING THAT IS NOT CONTROLLED WITH YOUR NAUSEA MEDICATION  *UNUSUAL SHORTNESS OF BREATH  *UNUSUAL BRUISING OR BLEEDING  TENDERNESS IN MOUTH AND THROAT WITH OR WITHOUT PRESENCE OF ULCERS  *URINARY PROBLEMS  *BOWEL PROBLEMS  UNUSUAL RASH Items with * indicate a potential emergency and should be followed up as soon as possible.  Feel free to call the clinic should you have any questions or concerns. The clinic phone number is [phone].  Please show the [place] at check-in to the Emergency Department and triage nurse.

## 2017-03-14 ENCOUNTER — Ambulatory Visit (HOSPITAL_BASED_OUTPATIENT_CLINIC_OR_DEPARTMENT_OTHER): Payer: Medicare Other

## 2017-03-14 ENCOUNTER — Telehealth: Payer: Self-pay | Admitting: *Deleted

## 2017-03-14 VITALS — BP 146/91 | HR 70 | Resp 18

## 2017-03-14 DIAGNOSIS — Z5189 Encounter for other specified aftercare: Secondary | ICD-10-CM | POA: Diagnosis not present

## 2017-03-14 DIAGNOSIS — C7951 Secondary malignant neoplasm of bone: Secondary | ICD-10-CM

## 2017-03-14 DIAGNOSIS — C61 Malignant neoplasm of prostate: Secondary | ICD-10-CM

## 2017-03-14 LAB — PSA: PROSTATE SPECIFIC AG, SERUM: 22.1 ng/mL — AB (ref 0.0–4.0)

## 2017-03-14 MED ORDER — PEGFILGRASTIM INJECTION 6 MG/0.6ML ~~LOC~~
6.0000 mg | PREFILLED_SYRINGE | Freq: Once | SUBCUTANEOUS | Status: AC
  Administered 2017-03-14: 6 mg via SUBCUTANEOUS

## 2017-03-14 MED ORDER — PEGFILGRASTIM INJECTION 6 MG/0.6ML ~~LOC~~
PREFILLED_SYRINGE | SUBCUTANEOUS | Status: AC
  Filled 2017-03-14: qty 0.6

## 2017-03-14 NOTE — Telephone Encounter (Signed)
Spoke with patient, gave results of last PSA 

## 2017-03-14 NOTE — Telephone Encounter (Signed)
-----   Message from Wyatt Portela, MD sent at 03/14/2017  8:18 AM EST ----- Please let him know his PSA is down.

## 2017-03-14 NOTE — Patient Instructions (Signed)
Pegfilgrastim injection What is this medicine? PEGFILGRASTIM (PEG fil gra stim) is a long-acting granulocyte colony-stimulating factor that stimulates the growth of neutrophils, a type of white blood cell important in the body's fight against infection. It is used to reduce the incidence of fever and infection in patients with certain types of cancer who are receiving chemotherapy that affects the bone marrow, and to increase survival after being exposed to high doses of radiation. This medicine may be used for other purposes; ask your health care provider or pharmacist if you have questions. COMMON BRAND NAME(S): Neulasta What should I tell my health care provider before I take this medicine? They need to know if you have any of these conditions: -kidney disease -latex allergy -ongoing radiation therapy -sickle cell disease -skin reactions to acrylic adhesives (On-Body Injector only) -an unusual or allergic reaction to pegfilgrastim, filgrastim, other medicines, foods, dyes, or preservatives -pregnant or trying to get pregnant -breast-feeding How should I use this medicine? This medicine is for injection under the skin. If you get this medicine at home, you will be taught how to prepare and give the pre-filled syringe or how to use the On-body Injector. Refer to the patient Instructions for Use for detailed instructions. Use exactly as directed. Tell your healthcare provider immediately if you suspect that the On-body Injector may not have performed as intended or if you suspect the use of the On-body Injector resulted in a missed or partial dose. It is important that you put your used needles and syringes in a special sharps container. Do not put them in a trash can. If you do not have a sharps container, call your pharmacist or healthcare provider to get one. Talk to your pediatrician regarding the use of this medicine in children. While this drug may be prescribed for selected conditions,  precautions do apply. Overdosage: If you think you have taken too much of this medicine contact a poison control center or emergency room at once. NOTE: This medicine is only for you. Do not share this medicine with others. What if I miss a dose? It is important not to miss your dose. Call your doctor or health care professional if you miss your dose. If you miss a dose due to an On-body Injector failure or leakage, a new dose should be administered as soon as possible using a single prefilled syringe for manual use. What may interact with this medicine? Interactions have not been studied. Give your health care provider a list of all the medicines, herbs, non-prescription drugs, or dietary supplements you use. Also tell them if you smoke, drink alcohol, or use illegal drugs. Some items may interact with your medicine. This list may not describe all possible interactions. Give your health care provider a list of all the medicines, herbs, non-prescription drugs, or dietary supplements you use. Also tell them if you smoke, drink alcohol, or use illegal drugs. Some items may interact with your medicine. What should I watch for while using this medicine? You may need blood work done while you are taking this medicine. If you are going to need a MRI, CT scan, or other procedure, tell your doctor that you are using this medicine (On-Body Injector only). What side effects may I notice from receiving this medicine? Side effects that you should report to your doctor or health care professional as soon as possible: -allergic reactions like skin rash, itching or hives, swelling of the face, lips, or tongue -dizziness -fever -pain, redness, or irritation at site   where injected -pinpoint red spots on the skin -red or dark-brown urine -shortness of breath or breathing problems -stomach or side pain, or pain at the shoulder -swelling -tiredness -trouble passing urine or change in the amount of urine Side  effects that usually do not require medical attention (report to your doctor or health care professional if they continue or are bothersome): -bone pain -muscle pain This list may not describe all possible side effects. Call your doctor for medical advice about side effects. You may report side effects to FDA at 1-800-FDA-1088. Where should I keep my medicine? Keep out of the reach of children. Store pre-filled syringes in a refrigerator between 2 and 8 degrees C (36 and 46 degrees F). Do not freeze. Keep in carton to protect from light. Throw away this medicine if it is left out of the refrigerator for more than 48 hours. Throw away any unused medicine after the expiration date. NOTE: This sheet is a summary. It may not cover all possible information. If you have questions about this medicine, talk to your doctor, pharmacist, or health care provider.  2018 Elsevier/Gold Standard (2016-03-08 12:58:03)  

## 2017-03-18 ENCOUNTER — Telehealth: Payer: Self-pay | Admitting: Oncology

## 2017-03-18 NOTE — Telephone Encounter (Signed)
No 12/19 los. °

## 2017-04-02 ENCOUNTER — Inpatient Hospital Stay: Payer: Medicare Other

## 2017-04-02 ENCOUNTER — Inpatient Hospital Stay: Payer: Medicare Other | Attending: Oncology | Admitting: Oncology

## 2017-04-02 ENCOUNTER — Telehealth: Payer: Self-pay | Admitting: Oncology

## 2017-04-02 VITALS — BP 153/77 | HR 65 | Temp 98.9°F | Resp 18 | Ht 72.0 in | Wt 182.9 lb

## 2017-04-02 DIAGNOSIS — M545 Low back pain: Secondary | ICD-10-CM

## 2017-04-02 DIAGNOSIS — C61 Malignant neoplasm of prostate: Secondary | ICD-10-CM

## 2017-04-02 DIAGNOSIS — Z95828 Presence of other vascular implants and grafts: Secondary | ICD-10-CM

## 2017-04-02 DIAGNOSIS — Z79899 Other long term (current) drug therapy: Secondary | ICD-10-CM

## 2017-04-02 DIAGNOSIS — C7951 Secondary malignant neoplasm of bone: Secondary | ICD-10-CM | POA: Diagnosis not present

## 2017-04-02 DIAGNOSIS — Z5111 Encounter for antineoplastic chemotherapy: Secondary | ICD-10-CM | POA: Insufficient documentation

## 2017-04-02 DIAGNOSIS — Z5189 Encounter for other specified aftercare: Secondary | ICD-10-CM | POA: Insufficient documentation

## 2017-04-02 DIAGNOSIS — R630 Anorexia: Secondary | ICD-10-CM | POA: Insufficient documentation

## 2017-04-02 LAB — COMPREHENSIVE METABOLIC PANEL
ALBUMIN: 3.7 g/dL (ref 3.5–5.0)
ALK PHOS: 67 U/L (ref 40–150)
ALT: 11 U/L (ref 0–55)
AST: 8 U/L (ref 5–34)
Anion gap: 7 (ref 3–11)
BILIRUBIN TOTAL: 0.4 mg/dL (ref 0.2–1.2)
BUN: 17 mg/dL (ref 7–26)
CALCIUM: 9.4 mg/dL (ref 8.4–10.4)
CO2: 23 mmol/L (ref 22–29)
CREATININE: 1.11 mg/dL (ref 0.70–1.30)
Chloride: 113 mmol/L — ABNORMAL HIGH (ref 98–109)
GFR calc Af Amer: >60 mL/min (ref >60)
GFR calc non Af Amer: >60 mL/min (ref >60)
GLUCOSE: 105 mg/dL (ref 70–140)
Potassium: 4 mmol/L (ref 3.5–5.1)
SODIUM: 143 mmol/L (ref 136–145)
TOTAL PROTEIN: 6.3 g/dL — AB (ref 6.4–8.3)

## 2017-04-02 LAB — CBC WITH DIFFERENTIAL/PLATELET
Abs Granulocyte: 3.9 10*3/uL (ref 1.5–6.5)
Basophils Absolute: 0 10*3/uL (ref 0.0–0.1)
Basophils Relative: 0 %
EOS ABS: 0 10*3/uL (ref 0.0–0.5)
EOS PCT: 0 %
HCT: 37.3 % — ABNORMAL LOW (ref 38.4–49.9)
Hemoglobin: 12 g/dL — ABNORMAL LOW (ref 13.0–17.1)
LYMPHS ABS: 1 10*3/uL (ref 0.9–3.3)
Lymphocytes Relative: 19 %
MCH: 29.6 pg (ref 27.2–33.4)
MCHC: 32.2 g/dL (ref 32.0–36.0)
MCV: 91.9 fL (ref 79.3–98.0)
MONO ABS: 0.6 10*3/uL (ref 0.1–0.9)
MONOS PCT: 10 %
Neutro Abs: 3.9 10*3/uL (ref 1.5–6.5)
Neutrophils Relative %: 71 %
PLATELETS: 226 10*3/uL (ref 140–400)
RBC: 4.06 MIL/uL — ABNORMAL LOW (ref 4.20–5.82)
RDW: 15.7 % — AB (ref 11.0–15.6)
WBC: 5.5 10*3/uL (ref 4.0–10.3)

## 2017-04-02 MED ORDER — SODIUM CHLORIDE 0.9 % IV SOLN
Freq: Once | INTRAVENOUS | Status: AC
  Administered 2017-04-02: 10:00:00 via INTRAVENOUS
  Filled 2017-04-02: qty 5

## 2017-04-02 MED ORDER — DEXTROSE 5 % IV SOLN: 20.0000 mg/m2 | Freq: Once | INTRAVENOUS | Status: DC

## 2017-04-02 MED ORDER — FAMOTIDINE IN NACL 20-0.9 MG/50ML-% IV SOLN
20.0000 mg | Freq: Once | INTRAVENOUS | Status: AC
  Administered 2017-04-02: 20 mg via INTRAVENOUS

## 2017-04-02 MED ORDER — HEPARIN SOD (PORK) LOCK FLUSH 100 UNIT/ML IV SOLN
500.0000 [IU] | Freq: Once | INTRAVENOUS | Status: DC
  Filled 2017-04-02: qty 5

## 2017-04-02 MED ORDER — FAMOTIDINE IN NACL 20-0.9 MG/50ML-% IV SOLN
INTRAVENOUS | Status: AC
  Filled 2017-04-02: qty 50

## 2017-04-02 MED ORDER — HEPARIN SOD (PORK) LOCK FLUSH 100 UNIT/ML IV SOLN
500.0000 [IU] | Freq: Once | INTRAVENOUS | Status: AC | PRN
  Administered 2017-04-02: 500 [IU]
  Filled 2017-04-02: qty 5

## 2017-04-02 MED ORDER — SODIUM CHLORIDE 0.9% FLUSH
10.0000 mL | INTRAVENOUS | Status: DC | PRN
  Administered 2017-04-02: 10 mL
  Filled 2017-04-02: qty 10

## 2017-04-02 MED ORDER — SODIUM CHLORIDE 0.9 % IV SOLN
Freq: Once | INTRAVENOUS | Status: AC
  Administered 2017-04-02: 09:00:00 via INTRAVENOUS

## 2017-04-02 MED ORDER — DIPHENHYDRAMINE HCL 50 MG/ML IJ SOLN
25.0000 mg | Freq: Once | INTRAMUSCULAR | Status: AC
  Administered 2017-04-02: 25 mg via INTRAVENOUS

## 2017-04-02 MED ORDER — PALONOSETRON HCL INJECTION 0.25 MG/5ML
INTRAVENOUS | Status: AC
  Filled 2017-04-02: qty 5

## 2017-04-02 MED ORDER — PALONOSETRON HCL INJECTION 0.25 MG/5ML
0.2500 mg | Freq: Once | INTRAVENOUS | Status: AC
  Administered 2017-04-02: 0.25 mg via INTRAVENOUS

## 2017-04-02 MED ORDER — ALTEPLASE 2 MG IJ SOLR
2.0000 mg | Freq: Once | INTRAMUSCULAR | Status: DC | PRN
  Filled 2017-04-02: qty 2

## 2017-04-02 MED ORDER — SODIUM CHLORIDE 0.9 % IV SOLN
20.0000 mg/m2 | Freq: Once | INTRAVENOUS | Status: AC
  Administered 2017-04-02: 39 mg via INTRAVENOUS
  Filled 2017-04-02: qty 3.9

## 2017-04-02 MED ORDER — SODIUM CHLORIDE 0.9% FLUSH
10.0000 mL | INTRAVENOUS | Status: DC | PRN
  Administered 2017-04-02: 10 mL via INTRAVENOUS
  Filled 2017-04-02: qty 10

## 2017-04-02 MED ORDER — DIPHENHYDRAMINE HCL 50 MG/ML IJ SOLN
INTRAMUSCULAR | Status: AC
Start: 2017-04-02 — End: 2017-04-02
  Filled 2017-04-02: qty 1

## 2017-04-02 NOTE — Progress Notes (Signed)
Hematology and Oncology Follow Up Visit  [name] [id number] Jun 07, 1945 72 y.o. 04/02/2017 8:45 AM   Principle Diagnosis: 23-year with prostate cancer diagnosed in October 2015. He had a Gleason score 4+5 = 9 and a PSA of 116.  He was found to have bulky retroperitoneal lymphadenopathy.   Prior Therapy:  Status post prostate biopsy on 01/19/2014. Systemic chemotherapy in the form of Taxotere 75 mg/m every 3 weeks with plans for total of 6 cycles. Cycle 1 given on 05/05/2014. He is status post 6 cycles of therapy concluded on 08/20/2014. Xtandi 160 mg daily started on 07/25/2015. Therapy discontinued in May 2018 because of progression of disease.   Current therapy:  Androgen deprivation in the form of Lupron.  He received Lupron on February 19, 2017.  Xgeva monthly injections started11/11/2015.   Jevtana chemotherapy started on 08/08/2016. He received 25 mg/m cycle 1. He will receive 20 mg/m in subsequent cycles.  He is here for evaluation before the next cycle of therapy.   Interim History:  [name] presents today with his wife for a follow-up visit. Since the last visit, he reports no severe illnesses or hospitalizations.  He received the last chemotherapy with mild nausea.  His nausea is grade 1 and likely associated with certain foods he eats.  He denies any vomiting or abdominal pain.  He denies any major changes in his bowel habits including diarrhea.  He denies any neuropathy associated with chemotherapy.  He denied any infusion related complications associated with chemotherapy.  He continues to report lower back pain that has been chronic in nature.  The pain is intermittent predominantly in his lower back graded at1 or 2 out of 10.  He reports taking Tylenol but nothing stronger at this time.  He reports his bone pain has improved overall with chemotherapy.  His appetite continues to be stable and his anorexia is improving.  He reports he takes dexamethasone for a few days  after each cycle of chemotherapy which helps with his appetite.  His energy and performance status is also improving at this time.  He is able to drive and attends to activities of daily living.  He has increased his exercise tolerance as well.  He reports his quality of life has improved on chemotherapy.  He does not report any headaches, blurry vision or syncope or seizures.  He does not report any neuropathy, weakness or any other neurological symptoms. He does not report any fevers, chills, or night sweats.. He does not report any chest pain, palpitation, orthopnea or leg edema. He does not report any cough, hemoptysis or hematemesis. He does not report any hematochezia, melena or change in his bowel habits.  He does not report any frequency, urgency or hesitancy.  He does not report any arthralgias, myalgias.  He does not report any skin rashes or lesions.  He does not report any lymphadenopathy, easy bruising or petechiae.  He denied any mood issues such as depression or anxiety.  Remainder of his review of systems is negative.  Medications: I have reviewed the patient's current medications.  Current Outpatient Medications  Medication Sig Dispense Refill  . aspirin 81 MG tablet Take 81 mg by mouth every morning.     Marland Kitchen dexamethasone (DECADRON) 4 MG tablet Take one tablet twice a day. Start 2 days after chemotherapy for 5 days. 30 tablet 3  . diphenhydramine-acetaminophen (TYLENOL PM) 25-500 MG TABS tablet Take 2 tablets by mouth at bedtime as needed.    Marland Kitchen  lidocaine-prilocaine (EMLA) cream Apply 1 application topically as needed. Apply to portacath on the day of chemotherapy. 30 g 3  . loratadine (CLARITIN) 10 MG tablet Take 10 mg by mouth daily as needed for allergies.    Marland Kitchen ondansetron (ZOFRAN) 4 MG tablet Take 1 tablet (4 mg total) by mouth every 8 (eight) hours as needed for nausea or vomiting. 40 tablet 1  . prochlorperazine (COMPAZINE) 10 MG tablet Take 1 tablet (10 mg total) by mouth every 6  (six) hours as needed for nausea or vomiting. 30 tablet 1  . promethazine (PHENERGAN) 25 MG tablet Take 1 tablet (25 mg total) by mouth every 6 (six) hours as needed for nausea or vomiting. 30 tablet 3  . pseudoephedrine (SUDAFED) 30 MG tablet Take 30 mg by mouth as directed.     No current facility-administered medications for this visit.    Facility-Administered Medications Ordered in Other Visits  Medication Dose Route Frequency Provider Last Rate Last Dose  . denosumab (XGEVA) injection 120 mg  120 mg Subcutaneous Once Kairon Shock, [name], MD         Allergies: No Known Allergies  Past Medical History, Surgical history, Social history: Reviewed today and no changes noted since the last visit.  He denies any smoking or alcohol abuse.   Physical Exam: Blood pressure (!) 153/77, pulse 65, temperature 98.9 F (37.2 C), temperature source Oral, resp. rate 18, height 6' (1.829 m), weight 182 lb 14.4 oz (83 kg), SpO2 100 %. ECOG: 1 General appearance: Alert, awake gentleman appeared without distress. Eyes: Sclera shows no icterus.  Conjunctiva is not injected.  His pupils are round and and reactive to light. Oropharynx: No thrush or ulcers.  No masses or lesions noted. Lymph nodes: No lymphadenopathy noted in the cervical, axillary or supraclavicular areas. Heart: No rubs or gallops.  Heart is regular rate and rhythm. Lung: Clear to auscultation in all lung fields.  No rhonchi or wheezes or dullness to percussion. Abdomin: Good bowel sounds auscultated.  Soft, nontender without any rebound or guarding.  No organomegaly. Neurological: No deficits noted.  No motor, sensory or deep tendon reflexes abnormalities noted. Skin: No rashes or lesions.  He has superficial vein that has been sclerosed. Skeletal: No bone or muscle abnormalities noted.  He has good range of motion on all extremities.   Lab Results:  CBC    Component Value Date/Time   WBC 5.5 04/02/2017 0804   RBC 4.06 (L)  04/02/2017 0804   HGB 12.0 (L) 04/02/2017 0804   HGB 12.2 (L) 03/13/2017 0904   HCT 37.3 (L) 04/02/2017 0804   HCT 37.8 (L) 03/13/2017 0904   PLT 226 04/02/2017 0804   PLT 207 03/13/2017 0904   MCV 91.9 04/02/2017 0804   MCV 92.0 03/13/2017 0904   MCH 29.6 04/02/2017 0804   MCHC 32.2 04/02/2017 0804   RDW 15.7 (H) 04/02/2017 0804   RDW 15.4 (H) 03/13/2017 0904   LYMPHSABS 1.0 04/02/2017 0804   LYMPHSABS 1.2 03/13/2017 0904   MONOABS 0.6 04/02/2017 0804   MONOABS 0.4 03/13/2017 0904   EOSABS 0.0 04/02/2017 0804   EOSABS 0.0 03/13/2017 0904   BASOSABS 0.0 04/02/2017 0804   BASOSABS 0.0 03/13/2017 0904   '    Chemistry      Component Value Date/Time   NA 142 03/13/2017 0904   K 4.4 03/13/2017 0904   CL 108 10/04/2014 0749   CO2 22 03/13/2017 0904   BUN 14.9 03/13/2017 0904   CREATININE  1.0 03/13/2017 0904      Component Value Date/Time   CALCIUM 9.1 03/13/2017 0904   ALKPHOS 67 03/13/2017 0904   AST 9 03/13/2017 0904   ALT 7 03/13/2017 0904   BILITOT 0.32 03/13/2017 0904      Results for [name], [name] (MRN [id number]) as of 04/02/2017 08:25  Ref. Range 02/19/2017 08:33 03/13/2017 09:04  Prostate Specific Ag, Serum Latest Ref Range: 0.0 - 4.0 ng/mL 25.1 (H) 22.1 (H)         Impression and Plan:  72 year old gentleman with the following issues:  1.  Castration resistant metastatic prostate cancer.  He presented with Gleason score is 4+5 = 9 with heavy volume disease involving 11 out of 12 cores of his biopsy obtained in October 2015. His PSA is 116 and his  workup revealed lymphadenopathy in the periaortic and iliac chains. He was treated initially androgen depravation under the care of Dr. [name].  He is status post 6 cycles of Taxotere chemotherapy given during androgen deprivation therapy while he is hormone sensitive.  PSA increased up to 5.4 and staging workup on 07/19/2015 with bone scan is indicating bony metastasis including activity in the sixth rib and  the inferior left ilium. 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  He started on Xtandi on 07/25/2015 until May 2018.  Therapy discontinued because of poor tolerance and progression of disease.  His PSA on 08/08/2016 was 28.8 with a rapid rise and a doubling time of 6 weeks.  Christinia Gully was started in May 2018 as a salvage therapy and he continues on to this current treatment.  Continues to tolerate this chemotherapy with few minor toxicities.  He reported grade 1 fatigue, grade 1 nausea and mild anorexia.  His cancer remains in excellent control with his PSA on 03/13/2017 was 22.1 which is a slight decline from 25.  His PSA overall showed about 50% reduction since the start of this chemotherapy in May 2018.  He also benefited clinically from the systemic chemotherapy which included improvement in his bone pain as well as appetite.  Risks and benefits of continuing this chemotherapy was reviewed today with the patient.  After discussion today, we have elected to proceed with a treatment break and continue to monitor his PSA periodically.  The cancer treatment will be resumed if his PSA starts to rise again or he develops.  2. Anorexia: He had experienced weight loss overall associated with his cancer and the start of chemotherapy.  His weight is much improved at this time after the addition of dexamethasone.  His weight today has been relatively stable and his appetite is improving.  We will continue to monitor this moving forward.  3. Androgen depravation: He is currently on Lupron 30 mg every 4 months.  Risks and benefits of this therapy was reviewed today and I emphasized the importance of continuing this treatment moving forward.  This is essential treatment for his prostate cancer in addition to chemotherapy.  He received the last treatment on February 19, 2017.  He will receive the next treatment 4 months after that.  4.  Osteoporosis prevention  and prevention of skeletal related events: He is at risk of developing such events because of his prostate cancer.  Delton See has been given in the past but currently on hold.  5. IV access: Port-A-Cath remained in place without any issues at this time.  This will continue to be flushed every 6 weeks.  6.  Neutropenia prophylaxis: This is related to chemotherapy which will cause neutropenia.  He will receive Neulasta after each chemotherapy cycle.  7. Nausea and vomiting: Antiemetics were reviewed today and his nausea and vomiting appears to be controlled with the current regimen.  Plan is to continue with using Phenergan as needed.  8. Follow-up: Will be in 6 weeks to follow his progress.   All his questions were answered today to his satisfaction.  25 minutes spent today with the patient and more than 50% was spent face-to-face which involves planning his treatment, answering questions and educating about his condition.   Chloeann Alfred,MD 1/8/20198:45 AM

## 2017-04-02 NOTE — Telephone Encounter (Signed)
Gave avs and calendar for february °

## 2017-04-02 NOTE — Patient Instructions (Signed)
[facility] Discharge Instructions for Patients Receiving Chemotherapy  Today you received the following chemotherapy agents Cabazitaxel (Jevtana).  To help prevent nausea and vomiting after your treatment, we encourage you to take your nausea medication as directed  If you develop nausea and vomiting that is not controlled by your nausea medication, call the clinic.   BELOW ARE SYMPTOMS THAT SHOULD BE REPORTED IMMEDIATELY:  *FEVER GREATER THAN 100.5 F  *CHILLS WITH OR WITHOUT FEVER  NAUSEA AND VOMITING THAT IS NOT CONTROLLED WITH YOUR NAUSEA MEDICATION  *UNUSUAL SHORTNESS OF BREATH  *UNUSUAL BRUISING OR BLEEDING  TENDERNESS IN MOUTH AND THROAT WITH OR WITHOUT PRESENCE OF ULCERS  *URINARY PROBLEMS  *BOWEL PROBLEMS  UNUSUAL RASH Items with * indicate a potential emergency and should be followed up as soon as possible.  Feel free to call the clinic should you have any questions or concerns. The clinic phone number is [phone].  Please show the [facility] at check-in to the Emergency Department and triage nurse.

## 2017-04-03 ENCOUNTER — Inpatient Hospital Stay: Payer: Medicare Other

## 2017-04-03 VITALS — BP 146/82 | HR 95 | Temp 97.8°F | Resp 20

## 2017-04-03 DIAGNOSIS — R630 Anorexia: Secondary | ICD-10-CM | POA: Diagnosis not present

## 2017-04-03 DIAGNOSIS — C7951 Secondary malignant neoplasm of bone: Secondary | ICD-10-CM | POA: Diagnosis not present

## 2017-04-03 DIAGNOSIS — M545 Low back pain: Secondary | ICD-10-CM | POA: Diagnosis not present

## 2017-04-03 DIAGNOSIS — C61 Malignant neoplasm of prostate: Secondary | ICD-10-CM

## 2017-04-03 DIAGNOSIS — Z79899 Other long term (current) drug therapy: Secondary | ICD-10-CM | POA: Diagnosis not present

## 2017-04-03 DIAGNOSIS — Z5111 Encounter for antineoplastic chemotherapy: Secondary | ICD-10-CM | POA: Diagnosis not present

## 2017-04-03 MED ORDER — PEGFILGRASTIM INJECTION 6 MG/0.6ML ~~LOC~~
6.0000 mg | PREFILLED_SYRINGE | Freq: Once | SUBCUTANEOUS | Status: AC
Start: 2017-04-03 — End: 2017-04-03
  Administered 2017-04-03: 6 mg via SUBCUTANEOUS

## 2017-04-03 MED ORDER — PEGFILGRASTIM INJECTION 6 MG/0.6ML ~~LOC~~
PREFILLED_SYRINGE | SUBCUTANEOUS | Status: AC
  Filled 2017-04-03: qty 0.6

## 2017-04-03 NOTE — Patient Instructions (Signed)
Pegfilgrastim injection What is this medicine? PEGFILGRASTIM (PEG fil gra stim) is a long-acting granulocyte colony-stimulating factor that stimulates the growth of neutrophils, a type of white blood cell important in the body's fight against infection. It is used to reduce the incidence of fever and infection in patients with certain types of cancer who are receiving chemotherapy that affects the bone marrow, and to increase survival after being exposed to high doses of radiation. This medicine may be used for other purposes; ask your health care provider or pharmacist if you have questions. COMMON BRAND NAME(S): Neulasta What should I tell my health care provider before I take this medicine? They need to know if you have any of these conditions: -kidney disease -latex allergy -ongoing radiation therapy -sickle cell disease -skin reactions to acrylic adhesives (On-Body Injector only) -an unusual or allergic reaction to pegfilgrastim, filgrastim, other medicines, foods, dyes, or preservatives -pregnant or trying to get pregnant -breast-feeding How should I use this medicine? This medicine is for injection under the skin. If you get this medicine at home, you will be taught how to prepare and give the pre-filled syringe or how to use the On-body Injector. Refer to the patient Instructions for Use for detailed instructions. Use exactly as directed. Tell your healthcare provider immediately if you suspect that the On-body Injector may not have performed as intended or if you suspect the use of the On-body Injector resulted in a missed or partial dose. It is important that you put your used needles and syringes in a special sharps container. Do not put them in a trash can. If you do not have a sharps container, call your pharmacist or healthcare provider to get one. Talk to your pediatrician regarding the use of this medicine in children. While this drug may be prescribed for selected conditions,  precautions do apply. Overdosage: If you think you have taken too much of this medicine contact a poison control center or emergency room at once. NOTE: This medicine is only for you. Do not share this medicine with others. What if I miss a dose? It is important not to miss your dose. Call your doctor or health care professional if you miss your dose. If you miss a dose due to an On-body Injector failure or leakage, a new dose should be administered as soon as possible using a single prefilled syringe for manual use. What may interact with this medicine? Interactions have not been studied. Give your health care provider a list of all the medicines, herbs, non-prescription drugs, or dietary supplements you use. Also tell them if you smoke, drink alcohol, or use illegal drugs. Some items may interact with your medicine. This list may not describe all possible interactions. Give your health care provider a list of all the medicines, herbs, non-prescription drugs, or dietary supplements you use. Also tell them if you smoke, drink alcohol, or use illegal drugs. Some items may interact with your medicine. What should I watch for while using this medicine? You may need blood work done while you are taking this medicine. If you are going to need a MRI, CT scan, or other procedure, tell your doctor that you are using this medicine (On-Body Injector only). What side effects may I notice from receiving this medicine? Side effects that you should report to your doctor or health care professional as soon as possible: -allergic reactions like skin rash, itching or hives, swelling of the face, lips, or tongue -dizziness -fever -pain, redness, or irritation at site   where injected -pinpoint red spots on the skin -red or dark-brown urine -shortness of breath or breathing problems -stomach or side pain, or pain at the shoulder -swelling -tiredness -trouble passing urine or change in the amount of urine Side  effects that usually do not require medical attention (report to your doctor or health care professional if they continue or are bothersome): -bone pain -muscle pain This list may not describe all possible side effects. Call your doctor for medical advice about side effects. You may report side effects to FDA at 1-800-FDA-1088. Where should I keep my medicine? Keep out of the reach of children. Store pre-filled syringes in a refrigerator between 2 and 8 degrees C (36 and 46 degrees F). Do not freeze. Keep in carton to protect from light. Throw away this medicine if it is left out of the refrigerator for more than 48 hours. Throw away any unused medicine after the expiration date. NOTE: This sheet is a summary. It may not cover all possible information. If you have questions about this medicine, talk to your doctor, pharmacist, or health care provider.  2018 Elsevier/Gold Standard (2016-03-08 12:58:03)  

## 2017-04-04 ENCOUNTER — Telehealth: Payer: Self-pay | Admitting: *Deleted

## 2017-04-04 LAB — PROSTATE-SPECIFIC AG, SERUM (LABCORP): Prostate Specific Ag, Serum: 14.9 ng/mL — ABNORMAL HIGH (ref 0.0–4.0)

## 2017-04-04 NOTE — Telephone Encounter (Signed)
As noted below by Dr. Shadad, I informed patient of his PSA level. He verbalized understanding.  

## 2017-04-04 NOTE — Telephone Encounter (Signed)
-----   Message from Wyatt Portela, MD sent at 04/04/2017 10:25 AM EST ----- Please let him know his PSA is lower.

## 2017-04-05 DIAGNOSIS — H21233 Degeneration of iris (pigmentary), bilateral: Secondary | ICD-10-CM | POA: Diagnosis not present

## 2017-05-15 ENCOUNTER — Inpatient Hospital Stay: Payer: Medicare Other

## 2017-05-15 ENCOUNTER — Inpatient Hospital Stay: Payer: Medicare Other | Attending: Oncology

## 2017-05-15 DIAGNOSIS — Z79818 Long term (current) use of other agents affecting estrogen receptors and estrogen levels: Secondary | ICD-10-CM | POA: Diagnosis not present

## 2017-05-15 DIAGNOSIS — Z79899 Other long term (current) drug therapy: Secondary | ICD-10-CM | POA: Diagnosis not present

## 2017-05-15 DIAGNOSIS — R630 Anorexia: Secondary | ICD-10-CM | POA: Insufficient documentation

## 2017-05-15 DIAGNOSIS — R5383 Other fatigue: Secondary | ICD-10-CM | POA: Diagnosis not present

## 2017-05-15 DIAGNOSIS — C7951 Secondary malignant neoplasm of bone: Secondary | ICD-10-CM | POA: Insufficient documentation

## 2017-05-15 DIAGNOSIS — Z9221 Personal history of antineoplastic chemotherapy: Secondary | ICD-10-CM | POA: Diagnosis not present

## 2017-05-15 DIAGNOSIS — Z95828 Presence of other vascular implants and grafts: Secondary | ICD-10-CM

## 2017-05-15 DIAGNOSIS — D759 Disease of blood and blood-forming organs, unspecified: Secondary | ICD-10-CM | POA: Diagnosis not present

## 2017-05-15 DIAGNOSIS — Z7982 Long term (current) use of aspirin: Secondary | ICD-10-CM | POA: Insufficient documentation

## 2017-05-15 DIAGNOSIS — G893 Neoplasm related pain (acute) (chronic): Secondary | ICD-10-CM | POA: Insufficient documentation

## 2017-05-15 DIAGNOSIS — C61 Malignant neoplasm of prostate: Secondary | ICD-10-CM | POA: Diagnosis not present

## 2017-05-15 LAB — COMPREHENSIVE METABOLIC PANEL
ALK PHOS: 66 U/L (ref 40–150)
ALT: 8 U/L (ref 0–55)
AST: 11 U/L (ref 5–34)
Albumin: 3.8 g/dL (ref 3.5–5.0)
Anion gap: 8 (ref 3–11)
BUN: 19 mg/dL (ref 7–26)
CALCIUM: 9.7 mg/dL (ref 8.4–10.4)
CHLORIDE: 112 mmol/L — AB (ref 98–109)
CO2: 22 mmol/L (ref 22–29)
CREATININE: 1.13 mg/dL (ref 0.70–1.30)
Glucose, Bld: 94 mg/dL (ref 70–140)
Potassium: 4.3 mmol/L (ref 3.5–5.1)
Sodium: 142 mmol/L (ref 136–145)
Total Bilirubin: 0.3 mg/dL (ref 0.2–1.2)
Total Protein: 6.6 g/dL (ref 6.4–8.3)

## 2017-05-15 LAB — CBC WITH DIFFERENTIAL/PLATELET
BASOS PCT: 0 %
Basophils Absolute: 0 10*3/uL (ref 0.0–0.1)
EOS PCT: 5 %
Eosinophils Absolute: 0.3 10*3/uL (ref 0.0–0.5)
HCT: 37 % — ABNORMAL LOW (ref 38.4–49.9)
HEMOGLOBIN: 11.9 g/dL — AB (ref 13.0–17.1)
LYMPHS ABS: 1.5 10*3/uL (ref 0.9–3.3)
Lymphocytes Relative: 28 %
MCH: 29 pg (ref 27.2–33.4)
MCHC: 32.2 g/dL (ref 32.0–36.0)
MCV: 90.2 fL (ref 79.3–98.0)
MONOS PCT: 8 %
Monocytes Absolute: 0.5 10*3/uL (ref 0.1–0.9)
NEUTROS PCT: 59 %
Neutro Abs: 3.3 10*3/uL (ref 1.5–6.5)
PLATELETS: 162 10*3/uL (ref 140–400)
RBC: 4.1 MIL/uL — AB (ref 4.20–5.82)
RDW: 14.9 % — ABNORMAL HIGH (ref 11.0–14.6)
WBC: 5.6 10*3/uL (ref 4.0–10.3)

## 2017-05-15 MED ORDER — HEPARIN SOD (PORK) LOCK FLUSH 100 UNIT/ML IV SOLN
500.0000 [IU] | Freq: Once | INTRAVENOUS | Status: AC
  Administered 2017-05-15: 500 [IU] via INTRAVENOUS
  Filled 2017-05-15: qty 5

## 2017-05-15 MED ORDER — SODIUM CHLORIDE 0.9% FLUSH
10.0000 mL | INTRAVENOUS | Status: DC | PRN
  Administered 2017-05-15: 10 mL via INTRAVENOUS
  Filled 2017-05-15: qty 10

## 2017-05-15 MED ORDER — ALTEPLASE 2 MG IJ SOLR
2.0000 mg | Freq: Once | INTRAMUSCULAR | Status: DC | PRN
  Filled 2017-05-15: qty 2

## 2017-05-16 LAB — PROSTATE-SPECIFIC AG, SERUM (LABCORP): Prostate Specific Ag, Serum: 18.8 ng/mL — ABNORMAL HIGH (ref 0.0–4.0)

## 2017-05-17 ENCOUNTER — Encounter: Payer: Self-pay | Admitting: Radiation Oncology

## 2017-05-17 ENCOUNTER — Inpatient Hospital Stay (HOSPITAL_BASED_OUTPATIENT_CLINIC_OR_DEPARTMENT_OTHER): Payer: Medicare Other | Admitting: Oncology

## 2017-05-17 ENCOUNTER — Telehealth: Payer: Self-pay | Admitting: Oncology

## 2017-05-17 VITALS — BP 137/79 | HR 69 | Temp 98.1°F | Resp 18 | Ht 73.0 in | Wt 180.8 lb

## 2017-05-17 DIAGNOSIS — R5383 Other fatigue: Secondary | ICD-10-CM

## 2017-05-17 DIAGNOSIS — Z7982 Long term (current) use of aspirin: Secondary | ICD-10-CM

## 2017-05-17 DIAGNOSIS — C61 Malignant neoplasm of prostate: Secondary | ICD-10-CM | POA: Diagnosis not present

## 2017-05-17 DIAGNOSIS — R630 Anorexia: Secondary | ICD-10-CM | POA: Diagnosis not present

## 2017-05-17 DIAGNOSIS — G893 Neoplasm related pain (acute) (chronic): Secondary | ICD-10-CM | POA: Diagnosis not present

## 2017-05-17 DIAGNOSIS — Z79818 Long term (current) use of other agents affecting estrogen receptors and estrogen levels: Secondary | ICD-10-CM

## 2017-05-17 DIAGNOSIS — Z79899 Other long term (current) drug therapy: Secondary | ICD-10-CM | POA: Diagnosis not present

## 2017-05-17 DIAGNOSIS — D759 Disease of blood and blood-forming organs, unspecified: Secondary | ICD-10-CM | POA: Diagnosis not present

## 2017-05-17 DIAGNOSIS — C7951 Secondary malignant neoplasm of bone: Secondary | ICD-10-CM

## 2017-05-17 DIAGNOSIS — Z9221 Personal history of antineoplastic chemotherapy: Secondary | ICD-10-CM | POA: Diagnosis not present

## 2017-05-17 NOTE — Telephone Encounter (Signed)
Appointmetns scheduled AVS/Calendar printed per 2/22 los

## 2017-05-17 NOTE — Progress Notes (Signed)
Hematology and Oncology Follow Up Visit  [name] [id number] [birth year] 72 y.o. 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 He had a Gleason score 4+5 = 9 and a PSA of 116.  He has bone disease mostly.   Prior Therapy:  Androgen deprivation therapy in addition to systemic chemotherapy in the form of Taxotere 75 mg/m every 3 weeks with plans for total of 6 cycles. Cycle 1 given on 05/05/2014. He is status post 6 cycles of therapy concluded on 08/20/2014.  He developed castration resistant disease in 2017.  Xtandi 160 mg daily started on 07/25/2015. Therapy discontinued in May 2018 because of progression of disease.  Jevtana chemotherapy started on 08/08/2016. He received 25 mg/m cycle 1 and then 20 mg/m in subsequent cycles.  Last cycle of chemotherapy given on 03/13/2017 for a total of 11 cycles.  Current therapy:  Androgen deprivation in the form of Lupron.  He received Lupron on February 19, 2017.  Xgeva monthly injections started11/11/2015.      Interim History:  [name] is here for a follow-up.  Since the last visit, he completed 11 cycles of chemotherapy with reasonable tolerance and overall improvement in his quality of life.  He has been on treatment break since January 2019.  He continues to recover reasonably well without any residual toxicities related to chemotherapy.  He does not report any excessive fatigue or tiredness.  His energy and performance status is slowly improving.  He does report periodic bone pain and back pain that is relieved with Tylenol.  He denies any pathological fractures or exacerbation of his pain.  He denies any peripheral neuropathy or neurological deficits.  He does have some memory issues since his chemotherapy has started.  His appetite remain excellent and his weight is improved.  He does not report any headaches, blurry vision or syncope or  seizures.  He does not report any neuropathy, weakness or any other neurological symptoms. He does not report any fevers, chills, or night sweats.. He does not report any chest pain, palpitation, orthopnea or leg edema. He does not report any cough, hemoptysis or hematemesis. He does not report any hematochezia, melena or change in his bowel habits.  He does not report any frequency, urgency or hesitancy.  He does not report any arthralgias, myalgias.  He does not report any skin rashes or lesions.  He does not report any lymphadenopathy, easy bruising or petechiae.  He denied any mood issues such as depression or anxiety.  Remainder of his review of systems is negative.  Medications: I have reviewed the patient's current medications.  Current Outpatient Medications  Medication Sig Dispense Refill  . aspirin 81 MG tablet Take 81 mg by mouth every morning.     Marland Kitchen dexamethasone (DECADRON) 4 MG tablet Take one tablet twice a day. Start 2 days after chemotherapy for 5 days. 30 tablet 3  . diphenhydramine-acetaminophen (TYLENOL PM) 25-500 MG TABS tablet Take 2 tablets by mouth at bedtime as needed.    . lidocaine-prilocaine (EMLA) cream Apply 1 application topically as needed. Apply to portacath on the day of chemotherapy. 30 g 3  . loratadine (CLARITIN) 10 MG tablet Take 10 mg by mouth daily as needed for allergies.    Marland Kitchen ondansetron (ZOFRAN) 4 MG tablet Take 1 tablet (4 mg total) by mouth every 8 (eight) hours as needed for nausea or vomiting. 40 tablet 1  . prochlorperazine (COMPAZINE)  10 MG tablet Take 1 tablet (10 mg total) by mouth every 6 (six) hours as needed for nausea or vomiting. 30 tablet 1  . promethazine (PHENERGAN) 25 MG tablet Take 1 tablet (25 mg total) by mouth every 6 (six) hours as needed for nausea or vomiting. 30 tablet 3  . pseudoephedrine (SUDAFED) 30 MG tablet Take 30 mg by mouth as directed.     No current facility-administered medications for this visit.    Facility-Administered  Medications Ordered in Other Visits  Medication Dose Route Frequency Provider Last Rate Last Dose  . denosumab (XGEVA) injection 120 mg  120 mg Subcutaneous Once Jalia Zuniga, [name], MD         Allergies: No Known Allergies  Past Medical History, Surgical history, Social history: Reviewed and remain unchanged.   Physical Exam: Blood pressure 137/79, pulse 69, temperature 98.1 F (36.7 C), temperature source Oral, resp. rate 18, height 6\' 1"  (1.854 m), weight 180 lb 12.8 oz (82 kg), SpO2 97 %.   ECOG: 1 General appearance: Alert, awake gentleman without distress. Eyes: His pupils are round and and reactive to light. Oropharynx: No mucous membrane abnormalities noted. Lymph nodes: No lymphadenopathy noted in the cervical, axillary or supraclavicular areas. Heart: Regular rate and rhythm without murmurs rubs or gallops. Lung: Clear without rhonchi, wheezes or dullness to percussion in all lung fields. Abdomin: Soft, nontender without any rebound or guarding. Neurological: Motor, sensory deficits.  Intact deep tendon reflexes. Skin: No skin rashes or lesions. Musculoskeletal: No joint deformity or effusion.  Full range of motion noted. Psychiatric: Appropriate mood and affect.   Lab Results:  CBC    Component Value Date/Time   WBC 5.6 05/15/2017 0932   RBC 4.10 (L) 05/15/2017 0932   HGB 11.9 (L) 05/15/2017 0932   HGB 12.2 (L) 03/13/2017 0904   HCT 37.0 (L) 05/15/2017 0932   HCT 37.8 (L) 03/13/2017 0904   PLT 162 05/15/2017 0932   PLT 207 03/13/2017 0904   MCV 90.2 05/15/2017 0932   MCV 92.0 03/13/2017 0904   MCH 29.0 05/15/2017 0932   MCHC 32.2 05/15/2017 0932   RDW 14.9 (H) 05/15/2017 0932   RDW 15.4 (H) 03/13/2017 0904   LYMPHSABS 1.5 05/15/2017 0932   LYMPHSABS 1.2 03/13/2017 0904   MONOABS 0.5 05/15/2017 0932   MONOABS 0.4 03/13/2017 0904   EOSABS 0.3 05/15/2017 0932   EOSABS 0.0 03/13/2017 0904   BASOSABS 0.0 05/15/2017 0932   BASOSABS 0.0 03/13/2017 0904    '    Chemistry      Component Value Date/Time   NA 142 05/15/2017 0932   NA 142 03/13/2017 0904   K 4.3 05/15/2017 0932   K 4.4 03/13/2017 0904   CL 112 (H) 05/15/2017 0932   CO2 22 05/15/2017 0932   CO2 22 03/13/2017 0904   BUN 19 05/15/2017 0932   BUN 14.9 03/13/2017 0904   CREATININE 1.13 05/15/2017 0932   CREATININE 1.0 03/13/2017 0904      Component Value Date/Time   CALCIUM 9.7 05/15/2017 0932   CALCIUM 9.1 03/13/2017 0904   ALKPHOS 66 05/15/2017 0932   ALKPHOS 67 03/13/2017 0904   AST 11 05/15/2017 0932   AST 9 03/13/2017 0904   ALT 8 05/15/2017 0932   ALT 7 03/13/2017 0904   BILITOT 0.3 05/15/2017 0932   BILITOT 0.32 03/13/2017 0904      Results for Diedrich, [name] (MRN [id number]) as of 05/17/2017 08:14  Ref. Range 04/02/2017 08:03 05/15/2017 10:20  Prostate Specific Ag, Serum Latest Ref Range: 0.0 - 4.0 ng/mL 14.9 (H) 18.8 (H)          Impression and Plan:  72 year old gentleman with the following issues:  1.  Castration resistant metastatic prostate cancer with disease predominantly to the bone.  He presented with advanced disease and treated with androgen deprivation in addition to Taxotere chemotherapy.  He developed castration resistant disease in 2017 and progressed on Xtandi in 2018.  He completed 11 cycles of Jevtana chemotherapy in December 2018.  The natural course of this disease was reviewed again with the patient and his wife.  He has castration resistant disease that has completed Jevtana chemotherapy with excellent response with a PSA nadir of 14.9 previously was around 34.  His PSA in May 15, 2017 was 18.0.  Options of therapy were reviewed today in detail.  I would include continued observation and surveillance, different salvage therapy in the form of Xofigo versus restarting of chemotherapy.  His CT scan and bone scan in January 2019 indicate bone disease only.  He would benefit from [place] at this time and we will make the  appropriate referral to get that started in the next 2-4 weeks.  He is agreeable to proceed with this plan.  Complication associated with this treatment was reviewed today which include myelosuppression, fatigue, other complications.  2. Anorexia: Improved upon stopping chemotherapy and his weight is improved.  3. Androgen depravation: He will continue Lupron at 30 mg every 4 months and will be due in March 2019.  4.  Osteoporosis prevention and prevention of skeletal related events: Restarting Delton See was discussed today and will be deferred to a later date based on his wishes.  5. IV access: Port-A-Cath will be flushed every 6-8 weeks.  6. Follow-up: Will be in 6 weeks to follow his progress.   25  minutes was spent with the patient face-to-face today.  More than 50% of time was dedicated to patient counseling, education and coordination of his multifaceted care including different treatment options.    Roxy Cedar [place] Valli,MD 2/22/20198:35 AM

## 2017-05-24 ENCOUNTER — Encounter: Payer: Self-pay | Admitting: Radiation Oncology

## 2017-05-24 NOTE — Progress Notes (Signed)
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   Sites of Visceral and Bony Metastatic Disease: bony disease  Location(s) of Symptomatic Metastases: periodic bone and back pain  Past/Anticipated chemotherapy by medical oncology, if any:  Prior Therapy:  Androgen deprivation therapy in addition to systemic chemotherapy in the form of Taxotere 75 mg/m every 3 weeks with plans for total of 6 cycles. Cycle 1 given on 05/05/2014. He is status post 6 cycles of therapy concluded on 08/20/2014.  He developed castration resistant disease in 2017.  Xtandi 160 mg daily started on 07/25/2015. Therapy discontinued in May 2018 because of progression of disease.  Jevtana chemotherapy started on 08/08/2016. He received 25 mg/m cycle 1 and then 20 mg/m in subsequent cycles.  Last cycle of chemotherapy given on 03/13/2017 for a total of 11 cycles.  Current therapy:  Androgen deprivation in the form of Lupron.  He received Lupron on February 19, 2017 and will be due again March 2019 (q55month). Patient believes he takes Lupron every six months.  Xgeva monthly injections started11/11/2015. Reports he hasn't received Xgeva in six weeks.    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 Reports Tylenol OTC x 2-3 resolves the pain. Denies pain presently.   If Spine Met(s), symptoms, if any, include:  Bowel/Bladder retention or incontinence (please describe): no  Numbness or weakness in extremities (please describe): no  Current Decadron regimen, if applicable: no  Ambulatory status? Walker? Wheelchair?: Ambulatory  SAFETY ISSUES:  Prior radiation? no  Pacemaker/ICD? no  Possible current pregnancy? no  Is the patient on methotrexate? no  Current Complaints / other details:  72year old male. Referral from consideration of Xofigo. Reports stamina is poor. Reports PSA jumped to 18 from 14  without treatment.

## 2017-05-27 ENCOUNTER — Encounter: Payer: Self-pay | Admitting: Medical Oncology

## 2017-05-27 ENCOUNTER — Encounter: Payer: Self-pay | Admitting: Radiation Oncology

## 2017-05-27 ENCOUNTER — Ambulatory Visit
Admission: RE | Admit: 2017-05-27 | Discharge: 2017-05-27 | Disposition: A | Discharge status: Home / Self Care | Payer: Medicare Other | Source: Ambulatory Visit | Attending: Radiation Oncology | Admitting: Radiation Oncology

## 2017-05-27 ENCOUNTER — Other Ambulatory Visit: Payer: Self-pay

## 2017-05-27 VITALS — BP 143/81 | HR 69 | Temp 97.8°F | Resp 18 | Ht 73.0 in | Wt 182.4 lb

## 2017-05-27 DIAGNOSIS — Z8249 Family history of ischemic heart disease and other diseases of the circulatory system: Secondary | ICD-10-CM | POA: Diagnosis not present

## 2017-05-27 DIAGNOSIS — Z87891 Personal history of nicotine dependence: Secondary | ICD-10-CM | POA: Insufficient documentation

## 2017-05-27 DIAGNOSIS — Z9221 Personal history of antineoplastic chemotherapy: Secondary | ICD-10-CM | POA: Diagnosis not present

## 2017-05-27 DIAGNOSIS — Z803 Family history of malignant neoplasm of breast: Secondary | ICD-10-CM | POA: Diagnosis not present

## 2017-05-27 DIAGNOSIS — Z79899 Other long term (current) drug therapy: Secondary | ICD-10-CM | POA: Diagnosis not present

## 2017-05-27 DIAGNOSIS — C7951 Secondary malignant neoplasm of bone: Secondary | ICD-10-CM

## 2017-05-27 DIAGNOSIS — Z7982 Long term (current) use of aspirin: Secondary | ICD-10-CM | POA: Insufficient documentation

## 2017-05-27 DIAGNOSIS — M545 Low back pain: Secondary | ICD-10-CM | POA: Diagnosis not present

## 2017-05-27 DIAGNOSIS — Z9289 Personal history of other medical treatment: Secondary | ICD-10-CM | POA: Diagnosis not present

## 2017-05-27 DIAGNOSIS — C61 Malignant neoplasm of prostate: Secondary | ICD-10-CM | POA: Diagnosis not present

## 2017-05-27 DIAGNOSIS — Z833 Family history of diabetes mellitus: Secondary | ICD-10-CM | POA: Insufficient documentation

## 2017-05-27 DIAGNOSIS — Z86010 Personal history of colonic polyps: Secondary | ICD-10-CM | POA: Diagnosis not present

## 2017-05-27 DIAGNOSIS — Z192 Hormone resistant malignancy status: Secondary | ICD-10-CM | POA: Diagnosis not present

## 2017-05-27 DIAGNOSIS — C7952 Secondary malignant neoplasm of bone marrow: Secondary | ICD-10-CM | POA: Diagnosis not present

## 2017-05-27 DIAGNOSIS — Z9889 Other specified postprocedural states: Secondary | ICD-10-CM | POA: Insufficient documentation

## 2017-05-27 DIAGNOSIS — Z825 Family history of asthma and other chronic lower respiratory diseases: Secondary | ICD-10-CM | POA: Insufficient documentation

## 2017-05-27 DIAGNOSIS — Z79818 Long term (current) use of other agents affecting estrogen receptors and estrogen levels: Secondary | ICD-10-CM | POA: Diagnosis not present

## 2017-05-27 NOTE — Progress Notes (Signed)
See progress note under physician encounter. 

## 2017-05-27 NOTE — Progress Notes (Signed)
[name] diagnosed in 2015 with prostate cancer. He is here today to consult with Dr.Manning to discuss Xofigo. He has been treated with Taxotere, Gillermina Phy and Jevtana. He tolerated these pretty well but did stop Xtandi due to loss of appetite and fatigue.He currently has no pain but when he does he takes Tylenol. We discussed side effects that include fatigue and bone marrow suppression. He was given written material about Xofigo.

## 2017-05-27 NOTE — Progress Notes (Signed)
Radiation Oncology         [phone] ________________________________  Initial Outpatient Consultation  Name: [name] MRN: [id number]  Date of Service: 05/27/2017 DOB: 10-Dec-1945  SE:[id number], Theophilus Kinds, MD  Wyatt Portela, MD   REFERRING PHYSICIAN: Wyatt Portela, MD  DIAGNOSIS: 72 y.o. gentleman with castrate resistant prostate cancer with skeletal metastases eligible for Xofigo    ICD-10-CM   1. Secondary malignant neoplasm of bone and bone marrow (HCC) C79.51    C79.52     HISTORY OF PRESENT ILLNESS: [name] is a 72 y.o. male seen at the request of Dr. [name] for castration resistant metastatic prostate cancer with disease predominantly in the bone. His prostate cancer was diagnosed in October 2015 with a PSA of 116 and Gleason score 4+5. He received androgen deprivation therapy in addition to 6 cycles of systemic chemotherapy with Taxotere given from 05/05/2014 to 08/20/2014. He developed castration resistant disease in 2017 and was started on Xtandi on 07/25/2015. This was discontinued in May 2018 due to progression of disease. He then received 11 cycles of Jevtana chemotherapy from 08/08/2016 to 03/13/2017 with reasonable tolerance and overall improvement in his quality of life. His most recent PSA on 05/15/2017 was 18.8, up from 14.9 that was taken following Jevtana treatment. He has continued on androgen deprivation with his last injection of Lupron on 02/19/2017, and he is receiving monthly Xgeva injections that were started on 02/02/2016. He reports periodic rib and back pain. 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 He denies any pathological fractures or exacerbation of his pain. His CT and bone scans from January 2018 indicate bone disease only. He has been referred today for discussion of Xofigo injection.  PREVIOUS RADIATION THERAPY: No  PAST MEDICAL HISTORY:  Past Medical History:  Diagnosis Date  . Diverticulosis of colon   . Hx  of colonic polyps   . Prostate cancer ([place])       PAST SURGICAL HISTORY: Past Surgical History:  Procedure Laterality Date  . CARDIOVASCULAR STRESS TEST  1996   Negative  . IR FLUORO GUIDE PORT INSERTION RIGHT  01/09/2017  . IR US GUIDE VASC ACCESS RIGHT  01/09/2017  . PORTACATH PLACEMENT      FAMILY HISTORY:  Family History  Problem Relation Age of Onset  . Heart attack Father   . Heart disease Father   . Heart attack Brother 3  . Heart disease Brother   . Heart attack Brother   . Heart disease Brother   . Cancer Sister        breast  . Hypertension Mother   . Diabetes Sister   . Cancer Maternal Aunt   . Cancer Maternal Aunt   . Asthma Son     SOCIAL HISTORY:  Social History   Socioeconomic History  . Marital status: Married    Spouse name: Not on file  . Number of children: 3  . Years of education: Not on file  . Highest education level: Not on file  Social Needs  . Financial resource strain: Not on file  . Food insecurity - worry: Not on file  . Food insecurity - inability: Not on file  . Transportation needs - medical: Not on file  . Transportation needs - non-medical: Not on file  Occupational History  . Occupation: cancer registry reporting  Tobacco Use  . Smoking status: Former Smoker    Packs/day: 1.00    Years: 10.00  Pack years: 10.00    Types: Cigarettes    Last attempt to quit: 03/26/1990    Years since quitting: 27.1  . Smokeless tobacco: Never Used  Substance and Sexual Activity  . Alcohol use: No    Frequency: Never  . Drug use: No  . Sexual activity: No  Other Topics Concern  . Not on file  Social History Narrative   Has living will   Wife is health care POA.   Would accept resuscitation    No tube feeds if cognitively unaware.    ALLERGIES: Patient has no known allergies.  MEDICATIONS:  Current Outpatient Medications  Medication Sig Dispense Refill  . aspirin 81 MG tablet Take 81 mg by mouth every morning.     .  diphenhydramine-acetaminophen (TYLENOL PM) 25-500 MG TABS tablet Take 2 tablets by mouth at bedtime as needed.    Marland Kitchen XIIDRA 5 % SOLN INSTILL 1 DROP INTO BOTH EYES TWICE A DAY  6  . dexamethasone (DECADRON) 4 MG tablet Take one tablet twice a day. Start 2 days after chemotherapy for 5 days. (Patient not taking: Reported on 05/27/2017) 30 tablet 3  . lidocaine-prilocaine (EMLA) cream Apply 1 application topically as needed. Apply to portacath on the day of chemotherapy. (Patient not taking: Reported on 05/27/2017) 30 g 3  . loratadine (CLARITIN) 10 MG tablet Take 10 mg by mouth daily as needed for allergies.    Marland Kitchen ondansetron (ZOFRAN) 4 MG tablet Take 1 tablet (4 mg total) by mouth every 8 (eight) hours as needed for nausea or vomiting. (Patient not taking: Reported on 05/27/2017) 40 tablet 1  . prochlorperazine (COMPAZINE) 10 MG tablet Take 1 tablet (10 mg total) by mouth every 6 (six) hours as needed for nausea or vomiting. (Patient not taking: Reported on 05/27/2017) 30 tablet 1  . promethazine (PHENERGAN) 25 MG tablet Take 1 tablet (25 mg total) by mouth every 6 (six) hours as needed for nausea or vomiting. (Patient not taking: Reported on 05/27/2017) 30 tablet 3  . pseudoephedrine (SUDAFED) 30 MG tablet Take 30 mg by mouth as directed.     No current facility-administered medications for this encounter.    Facility-Administered Medications Ordered in Other Encounters  Medication Dose Route Frequency Provider Last Rate Last Dose  . denosumab (XGEVA) injection 120 mg  120 mg Subcutaneous Once Shadad, [name], MD        REVIEW OF SYSTEMS:  On review of systems, the patient reports that he is doing well overall. He denies any chest pain, shortness of breath, cough, fevers, chills, night sweats, or unintended weight changes. He reports poor stamina and fluctuating appetite. He denies any bowel or bladder disturbances, and denies abdominal pain, nausea or vomiting. He reports periodic bone and low back pain and  denies any pain presently. He denies any abnormal blood loss. He denies any numbness or weakness in his extremities. A complete review of systems is obtained and is otherwise negative.    PHYSICAL EXAM:  Wt Readings from Last 3 Encounters:  05/27/17 182 lb 6.4 oz (82.7 kg)  05/17/17 180 lb 12.8 oz (82 kg)  04/02/17 182 lb 14.4 oz (83 kg)   Temp Readings from Last 3 Encounters:  05/27/17 97.8 F (36.6 C) (Oral)  05/17/17 98.1 F (36.7 C) (Oral)  04/03/17 97.8 F (36.6 C) (Oral)   BP Readings from Last 3 Encounters:  05/27/17 (!) 143/81  05/17/17 137/79  04/03/17 (!) 146/82   Pulse Readings from Last 3 Encounters:  05/27/17 69  05/17/17 69  04/03/17 95   Pain Assessment Pain Score: 0-No pain/10  In general this is a well appearing caucasian male in no acute distress. He's alert and oriented x4 and appropriate throughout the examination. Cardiopulmonary assessment is negative for acute distress and he exhibits normal effort.   KPS = 90  100 - Normal; no complaints; no evidence of disease. 90   - Able to carry on normal activity; minor signs or symptoms of disease. 80   - Normal activity with effort; some signs or symptoms of disease. 56   - Cares for self; unable to carry on normal activity or to do active work. 60   - Requires occasional assistance, but is able to care for most of his personal needs. 50   - Requires considerable assistance and frequent medical care. 38   - Disabled; requires special care and assistance. 25   - Severely disabled; hospital admission is indicated although death not imminent. 35   - Very sick; hospital admission necessary; active supportive treatment necessary. 10   - Moribund; fatal processes progressing rapidly. 0     - Dead  Karnofsky DA, Abelmann [place], Craver LS and Burchenal [facility] [phone]) The use of the nitrogen mustards in the palliative treatment of carcinoma: with particular reference to bronchogenic carcinoma Cancer 1 634-56  LABORATORY  DATA:  Lab Results  Component Value Date   WBC 5.6 05/15/2017   HGB 11.9 (L) 05/15/2017   HCT 37.0 (L) 05/15/2017   MCV 90.2 05/15/2017   PLT 162 05/15/2017   Lab Results  Component Value Date   NA 142 05/15/2017   K 4.3 05/15/2017   CL 112 (H) 05/15/2017   CO2 22 05/15/2017   Lab Results  Component Value Date   ALT 8 05/15/2017   AST 11 05/15/2017   ALKPHOS 66 05/15/2017   BILITOT 0.3 05/15/2017     RADIOGRAPHY: No results found.    IMPRESSION/PLAN: 1. 72 y.o. gentleman with castrate resistant prostate cancer. We met today with the patient and family about the findings and work-up thus far.  We discussed the natural history of prostate cancer but specifically the use of Xofigo castration resistant metastatic prostate cancer to the bone and general treatment, highlighting the role of radiotherapy in the management.  We discussed Trudi Ida and focused on the details of logistics and delivery.  We reviewed the anticipated acute and late sequelae associated with radiation in this setting, including diarrhea and low blood counts.  The patient would like to consider proceeding with Xofigo therapy and will be scheduled for his first infusion in the near future upon his decision to proceed.  In a visit lasting 45 minutes, greater than 50% of the time was spent face to face discussing his case, and coordinating the patient's care.      Carola Rhine, PAC    Tyler Pita, MD  [facility] Direct Dial: [phone]  Fax: [phone] [email or web address]  Skype  LinkedIn    Page Me      This document serves as a record of services personally performed by Tyler Pita, MD and Shona Simpson, PA-C. It was created on their behalf by Rae Lips, a trained medical scribe. The creation of this record is based on the scribe's personal observations and the providers' statements to them. This document has been checked and approved by the attending  providers.

## 2017-05-29 ENCOUNTER — Telehealth: Payer: Self-pay | Admitting: *Deleted

## 2017-05-29 ENCOUNTER — Other Ambulatory Visit: Payer: Self-pay | Admitting: Radiation Oncology

## 2017-05-29 DIAGNOSIS — C7951 Secondary malignant neoplasm of bone: Principal | ICD-10-CM

## 2017-05-29 DIAGNOSIS — C61 Malignant neoplasm of prostate: Secondary | ICD-10-CM

## 2017-05-29 NOTE — Telephone Encounter (Signed)
CALLED PATIENT ABOUT LAB AND XIFIGO INJ., PATIENT STATED THAT HE HASN'T MADE HIS MIND UP YET, HE WILL CALL ME IF HE DECIDES TO HAVE THIS INJECTION

## 2017-06-04 ENCOUNTER — Encounter: Payer: Self-pay | Admitting: Oncology

## 2017-06-04 NOTE — Progress Notes (Unsigned)
Received PA determination via Cover My Meds for Lidocaine-Prilocaine cream:  [name] (Key: [id number])   This request has received a Favorable outcome. Please note any additional information provided by [employer] Part D at the bottom of this request.  Per [facility] [employer] PA approved 03/06/17 - 09/02/17.

## 2017-06-04 NOTE — Progress Notes (Signed)
Received PA request for Lidocaine-Prilocaine cream.  Submitted via Cover My Meds online:  KENDRY Mcdonell (Key: [id number])   Your information has been submitted to [place] Medicare Part D. [employer] will review the request and will issue a decision, typically within 1-3 days from your submission. You can check the updated outcome later by reopening this request. If [employer] has not responded in 1-3 days or if you have any questions about your ePA request, please contact [place] Medicare Part D at [phone]. If you think there may be a problem with your PA request, use our live chat feature at the bottom right.

## 2017-06-07 ENCOUNTER — Telehealth: Payer: Self-pay | Admitting: *Deleted

## 2017-06-07 ENCOUNTER — Other Ambulatory Visit: Payer: Self-pay | Admitting: Radiation Oncology

## 2017-06-07 DIAGNOSIS — C7951 Secondary malignant neoplasm of bone: Secondary | ICD-10-CM

## 2017-06-07 DIAGNOSIS — C7952 Secondary malignant neoplasm of bone marrow: Principal | ICD-10-CM

## 2017-06-07 NOTE — Telephone Encounter (Signed)
CALLED PATIENT TO REMIND OF LABS AND WEIGHT FOR 06-10-17 @ 12 PM @ [place], SPOKE WITH PATIENT AND HE IS AWARE OF THIS APPT.

## 2017-06-10 ENCOUNTER — Ambulatory Visit: Payer: Medicare Other

## 2017-06-10 ENCOUNTER — Ambulatory Visit
Admission: RE | Admit: 2017-06-10 | Discharge: 2017-06-10 | Disposition: A | Discharge status: Home / Self Care | Payer: Medicare Other | Source: Ambulatory Visit | Attending: Radiation Oncology | Admitting: Radiation Oncology

## 2017-06-10 DIAGNOSIS — C7952 Secondary malignant neoplasm of bone marrow: Secondary | ICD-10-CM | POA: Insufficient documentation

## 2017-06-10 DIAGNOSIS — C801 Malignant (primary) neoplasm, unspecified: Secondary | ICD-10-CM | POA: Insufficient documentation

## 2017-06-10 DIAGNOSIS — C7951 Secondary malignant neoplasm of bone: Secondary | ICD-10-CM | POA: Diagnosis not present

## 2017-06-10 LAB — CBC WITH DIFFERENTIAL (CANCER CENTER ONLY)
BASOS ABS: 0 10*3/uL (ref 0.0–0.1)
BASOS PCT: 1 %
Eosinophils Absolute: 0.2 10*3/uL (ref 0.0–0.5)
Eosinophils Relative: 4 %
HEMATOCRIT: 38.9 % (ref 38.4–49.9)
HEMOGLOBIN: 12.8 g/dL — AB (ref 13.0–17.1)
Lymphocytes Relative: 35 %
Lymphs Abs: 1.6 10*3/uL (ref 0.9–3.3)
MCH: 28.5 pg (ref 27.2–33.4)
MCHC: 32.9 g/dL (ref 32.0–36.0)
MCV: 86.5 fL (ref 79.3–98.0)
MONO ABS: 0.5 10*3/uL (ref 0.1–0.9)
Monocytes Relative: 11 %
NEUTROS ABS: 2.2 10*3/uL (ref 1.5–6.5)
NEUTROS PCT: 49 %
Platelet Count: 181 10*3/uL (ref 140–400)
RBC: 4.5 MIL/uL (ref 4.20–5.82)
RDW: 14.9 % — ABNORMAL HIGH (ref 11.0–14.6)
WBC Count: 4.4 10*3/uL (ref 4.0–10.3)

## 2017-06-11 ENCOUNTER — Telehealth: Payer: Self-pay | Admitting: Radiation Oncology

## 2017-06-11 NOTE — Telephone Encounter (Signed)
Call transferred from [facility], PA-C to me. Patient has questions about initial Xofigo injection scheduled for Monday, 06/17/2017. Patient inquired if power port would be used to administer xofigo injection. Reassured the patient it would. In addition, explained the xofigo rep has even consulted with the IF staff and reinforced their knowledge of xofigo administration via a port. Patient expressed frustration that port wasn't used to draw CBC with diff this Monday. Patient states, "I can't handle the pain of an IV stick." Patient verbalized he would reconsider moving forward with xofigo treatment if his port couldn't be used for lab draws. Patient understands this RN will communicate with management and determine best practice to accommodate his special needs. Patient verbalized understanding of all reviewed and expressed appreciation for time spent.

## 2017-06-13 ENCOUNTER — Encounter: Payer: Self-pay | Admitting: Oncology

## 2017-06-17 ENCOUNTER — Ambulatory Visit (HOSPITAL_COMMUNITY)
Admission: RE | Admit: 2017-06-17 | Discharge: 2017-06-17 | Disposition: A | Discharge status: Home / Self Care | Payer: Medicare Other | Source: Ambulatory Visit | Attending: Radiation Oncology | Admitting: Radiation Oncology

## 2017-06-17 ENCOUNTER — Telehealth: Payer: Self-pay | Admitting: *Deleted

## 2017-06-17 DIAGNOSIS — C7951 Secondary malignant neoplasm of bone: Secondary | ICD-10-CM | POA: Insufficient documentation

## 2017-06-17 DIAGNOSIS — C61 Malignant neoplasm of prostate: Secondary | ICD-10-CM | POA: Diagnosis not present

## 2017-06-17 MED ORDER — RADIUM RA 223 DICHLORIDE 30 MCCI/ML IV SOLN: 118.2000 | Freq: Once | INTRAVENOUS | Status: DC

## 2017-06-17 NOTE — Progress Notes (Signed)
  Radiation Oncology         (336) [phone] ________________________________  Name: [name] MRN: [id number]  Date: 06/17/2017  DOB: 1945/07/27  Radium-223 Infusion Note  Diagnosis:  Castration resistant prostate cancer with painful bone involvement  Current Infusion:    1  Planned Infusions:  6  Narrative: [name] presented to nuclear medicine for treatment. His most recent blood counts were reviewed.  He remains a good candidate to proceed with Ra-223.  The patient was situated in an infusion suite with a contact barrier placed under his arm. Intravenous access was established, using sterile technique, and a normal saline infusion from a syringe was started.  Micro-dosimetry:  The prescribed radiation activity was assayed and confirmed to be within specified tolerance.  Special Treatment Procedure - Infusion:  The nuclear medicine technologist and I personally verified the dose activity to be delivered as specified in the written directive, and verified the patient identification via 2 separate methods.  The syringe containing the dose was attached to a 3 way stopcock, and then the valve was opened to the patient, and the dose delivered over a minute. No complications were noted.  The total administered dose was 121.3 microcuries in a volume of 6.1 cc.   A saline flush of the line and the syringe that contained the isotope was then performed.  The residual radioactivity in the syringe was 3.1 microcuries, so the actual infused isotope activity was 118.2 microcuries.   Pressure was applied to the venipuncture site, and a compression bandage placed.   Radiation Safety personnel were present to perform the discharge survey, as detailed on their documentation.   After a short period of observation, the patient had his IV removed.  Impression:  The patient tolerated his infusion relatively well.  Plan:  The patient will return in one month for ongoing care.      ________________________________  Sheral Apley. Tammi Klippel, M.D.  This document serves as a record of services personally performed by Tyler Pita, MD. It was created on his behalf by Rae Lips, a trained medical scribe. The creation of this record is based on the scribe's personal observations and the provider's statements to them. This document has been checked and approved by the attending provider.

## 2017-06-17 NOTE — Telephone Encounter (Signed)
CALLED PATIENT TO REMIND OF XOFIGO INJ. FOR 06-17-17 - ARRIVAL TIME - 11:45 AM @ [facility], LVM FOR A RETURN CALL

## 2017-06-18 ENCOUNTER — Inpatient Hospital Stay: Payer: Medicare Other | Attending: Oncology

## 2017-06-18 ENCOUNTER — Inpatient Hospital Stay: Payer: Medicare Other

## 2017-06-18 DIAGNOSIS — Z452 Encounter for adjustment and management of vascular access device: Secondary | ICD-10-CM | POA: Diagnosis not present

## 2017-06-18 DIAGNOSIS — Z7982 Long term (current) use of aspirin: Secondary | ICD-10-CM | POA: Insufficient documentation

## 2017-06-18 DIAGNOSIS — R110 Nausea: Secondary | ICD-10-CM | POA: Diagnosis not present

## 2017-06-18 DIAGNOSIS — Z9221 Personal history of antineoplastic chemotherapy: Secondary | ICD-10-CM | POA: Insufficient documentation

## 2017-06-18 DIAGNOSIS — Z95828 Presence of other vascular implants and grafts: Secondary | ICD-10-CM | POA: Insufficient documentation

## 2017-06-18 DIAGNOSIS — C61 Malignant neoplasm of prostate: Secondary | ICD-10-CM

## 2017-06-18 DIAGNOSIS — M810 Age-related osteoporosis without current pathological fracture: Secondary | ICD-10-CM | POA: Insufficient documentation

## 2017-06-18 DIAGNOSIS — C7951 Secondary malignant neoplasm of bone: Secondary | ICD-10-CM | POA: Insufficient documentation

## 2017-06-18 DIAGNOSIS — Z79899 Other long term (current) drug therapy: Secondary | ICD-10-CM | POA: Insufficient documentation

## 2017-06-18 DIAGNOSIS — Z79818 Long term (current) use of other agents affecting estrogen receptors and estrogen levels: Secondary | ICD-10-CM | POA: Insufficient documentation

## 2017-06-18 DIAGNOSIS — R630 Anorexia: Secondary | ICD-10-CM | POA: Diagnosis not present

## 2017-06-18 LAB — CBC WITH DIFFERENTIAL (CANCER CENTER ONLY)
Basophils Absolute: 0 10*3/uL (ref 0.0–0.1)
Basophils Relative: 1 %
Eosinophils Absolute: 0.2 10*3/uL (ref 0.0–0.5)
Eosinophils Relative: 4 %
HCT: 37.6 % — ABNORMAL LOW (ref 38.4–49.9)
Hemoglobin: 12.4 g/dL — ABNORMAL LOW (ref 13.0–17.1)
LYMPHS ABS: 1.3 10*3/uL (ref 0.9–3.3)
LYMPHS PCT: 27 %
MCH: 28.7 pg (ref 27.2–33.4)
MCHC: 33 g/dL (ref 32.0–36.0)
MCV: 86.7 fL (ref 79.3–98.0)
MONO ABS: 0.5 10*3/uL (ref 0.1–0.9)
Monocytes Relative: 10 %
Neutro Abs: 2.8 10*3/uL (ref 1.5–6.5)
Neutrophils Relative %: 58 %
PLATELETS: 188 10*3/uL (ref 140–400)
RBC: 4.34 MIL/uL (ref 4.20–5.82)
RDW: 14.5 % (ref 11.0–14.6)
WBC: 4.8 10*3/uL (ref 4.0–10.3)

## 2017-06-18 LAB — CMP (CANCER CENTER ONLY)
ALK PHOS: 63 U/L (ref 40–150)
ALT: 8 U/L (ref 0–55)
ANION GAP: 7 (ref 3–11)
AST: 14 U/L (ref 5–34)
Albumin: 4 g/dL (ref 3.5–5.0)
BUN: 19 mg/dL (ref 7–26)
CO2: 24 mmol/L (ref 22–29)
Calcium: 10.2 mg/dL (ref 8.4–10.4)
Chloride: 109 mmol/L (ref 98–109)
Creatinine: 1.12 mg/dL (ref 0.70–1.30)
Glucose, Bld: 90 mg/dL (ref 70–140)
Potassium: 4.5 mmol/L (ref 3.5–5.1)
SODIUM: 140 mmol/L (ref 136–145)
Total Bilirubin: 0.3 mg/dL (ref 0.2–1.2)
Total Protein: 7 g/dL (ref 6.4–8.3)

## 2017-06-18 MED ORDER — SODIUM CHLORIDE 0.9% FLUSH
10.0000 mL | Freq: Once | INTRAVENOUS | Status: AC
  Administered 2017-06-18: 10 mL
  Filled 2017-06-18: qty 10

## 2017-06-18 MED ORDER — HEPARIN SOD (PORK) LOCK FLUSH 100 UNIT/ML IV SOLN
500.0000 [IU] | Freq: Once | INTRAVENOUS | Status: DC
  Filled 2017-06-18: qty 5

## 2017-06-19 ENCOUNTER — Other Ambulatory Visit: Payer: Self-pay | Admitting: Radiation Oncology

## 2017-06-19 DIAGNOSIS — C7951 Secondary malignant neoplasm of bone: Principal | ICD-10-CM

## 2017-06-19 DIAGNOSIS — C61 Malignant neoplasm of prostate: Secondary | ICD-10-CM

## 2017-06-19 LAB — PROSTATE-SPECIFIC AG, SERUM (LABCORP): Prostate Specific Ag, Serum: 47.9 ng/mL — ABNORMAL HIGH (ref 0.0–4.0)

## 2017-06-20 ENCOUNTER — Inpatient Hospital Stay: Payer: Medicare Other

## 2017-06-20 ENCOUNTER — Inpatient Hospital Stay (HOSPITAL_BASED_OUTPATIENT_CLINIC_OR_DEPARTMENT_OTHER): Payer: Medicare Other | Admitting: Oncology

## 2017-06-20 ENCOUNTER — Other Ambulatory Visit: Payer: Self-pay | Admitting: Radiation Oncology

## 2017-06-20 ENCOUNTER — Telehealth: Payer: Self-pay | Admitting: Oncology

## 2017-06-20 VITALS — BP 116/69 | HR 79 | Temp 97.8°F | Resp 18

## 2017-06-20 VITALS — BP 124/77 | HR 77 | Temp 97.8°F | Resp 18 | Ht 73.0 in | Wt 179.9 lb

## 2017-06-20 DIAGNOSIS — C7951 Secondary malignant neoplasm of bone: Secondary | ICD-10-CM | POA: Diagnosis not present

## 2017-06-20 DIAGNOSIS — Z79818 Long term (current) use of other agents affecting estrogen receptors and estrogen levels: Secondary | ICD-10-CM

## 2017-06-20 DIAGNOSIS — C61 Malignant neoplasm of prostate: Secondary | ICD-10-CM

## 2017-06-20 DIAGNOSIS — Z452 Encounter for adjustment and management of vascular access device: Secondary | ICD-10-CM | POA: Diagnosis not present

## 2017-06-20 DIAGNOSIS — Z95828 Presence of other vascular implants and grafts: Secondary | ICD-10-CM

## 2017-06-20 DIAGNOSIS — R630 Anorexia: Secondary | ICD-10-CM

## 2017-06-20 DIAGNOSIS — Z7982 Long term (current) use of aspirin: Secondary | ICD-10-CM | POA: Diagnosis not present

## 2017-06-20 DIAGNOSIS — R110 Nausea: Secondary | ICD-10-CM | POA: Diagnosis not present

## 2017-06-20 DIAGNOSIS — Z9221 Personal history of antineoplastic chemotherapy: Secondary | ICD-10-CM | POA: Diagnosis not present

## 2017-06-20 DIAGNOSIS — C7952 Secondary malignant neoplasm of bone marrow: Principal | ICD-10-CM

## 2017-06-20 DIAGNOSIS — Z79899 Other long term (current) drug therapy: Secondary | ICD-10-CM | POA: Diagnosis not present

## 2017-06-20 DIAGNOSIS — M810 Age-related osteoporosis without current pathological fracture: Secondary | ICD-10-CM | POA: Diagnosis not present

## 2017-06-20 MED ORDER — LEUPROLIDE ACETATE (4 MONTH) 30 MG IM KIT
30.0000 mg | PACK | Freq: Once | INTRAMUSCULAR | Status: AC
  Administered 2017-06-20: 30 mg via INTRAMUSCULAR
  Filled 2017-06-20: qty 30

## 2017-06-20 NOTE — Progress Notes (Signed)
Hematology and Oncology Follow Up Visit  [name] [id number] 11-27-1945 72 y.o. 06/20/2017 3:50 PM   Principle Diagnosis: 72 year old man with castration resistant metastatic prostate cancer with disease to the bone.  He was initially diagnosed in October 2015 with  Gleason score 4+5 = 9 and a PSA of 116.    Prior Therapy:  Androgen deprivation therapy in addition to systemic chemotherapy in the form of Taxotere 75 mg/m every 3 weeks with plans for total of 6 cycles. Cycle 1 given on 05/05/2014. He is status post 6 cycles of therapy concluded on 08/20/2014.  He developed castration resistant disease in 2017.  Xtandi 160 mg daily started on 07/25/2015. Therapy discontinued in May 2018 because of progression of disease.  Jevtana chemotherapy started on 08/08/2016. He received 25 mg/m cycle 1 and then 20 mg/m in subsequent cycles.  Last cycle of chemotherapy given on 03/13/2017 for a total of 11 cycles.  Current therapy:  Androgen deprivation in the form of Lupron.  He received Lupron on February 19, 2017.  Xgeva monthly injections started 02/02/2016.   Xofigo monthly injections started on June 17, 2017.   Interim History:  [name] is presents today for a follow-up.  Since last visit he received the first Xofigo this week without any complications.  He denies excessive fatigue or tiredness.  He denies any injection related complications.  His performance status is improving and his exercise capacity is also increasing slightly.  He is able to use a treadmill 4 days a week for 30 minutes at a time.  His appetite has improved although he was slightly nauseated today and to Compazine.  His weight has been relatively stable.  He does not report any headaches, blurry vision or syncope or seizures.  He does not report any fevers, chills, or night sweats.. He does not report any chest pain, palpitation, orthopnea or leg edema. He does not report any cough, hemoptysis or hematemesis. He does  not report any hematochezia, melena.  He does not report any frequency, urgency or hesitancy.  He does not report any arthralgias, myalgias.  He does not report any skin rashes or lesions.  He does not report any lymphadenopathy, easy bruising or petechiae.  He denies any bone pain or pathological fractures.  He denies any anxiety or depression.  Remainder of his review of systems is negative.  Medications: I have reviewed the patient's current medications.  Current Outpatient Medications  Medication Sig Dispense Refill  . aspirin 81 MG tablet Take 81 mg by mouth every morning.     Marland Kitchen dexamethasone (DECADRON) 4 MG tablet Take one tablet twice a day. Start 2 days after chemotherapy for 5 days. (Patient not taking: Reported on 05/27/2017) 30 tablet 3  . diphenhydramine-acetaminophen (TYLENOL PM) 25-500 MG TABS tablet Take 2 tablets by mouth at bedtime as needed.    . lidocaine-prilocaine (EMLA) cream Apply 1 application topically as needed. Apply to portacath on the day of chemotherapy. (Patient not taking: Reported on 05/27/2017) 30 g 3  . loratadine (CLARITIN) 10 MG tablet Take 10 mg by mouth daily as needed for allergies.    Marland Kitchen ondansetron (ZOFRAN) 4 MG tablet Take 1 tablet (4 mg total) by mouth every 8 (eight) hours as needed for nausea or vomiting. (Patient not taking: Reported on 05/27/2017) 40 tablet 1  . prochlorperazine (COMPAZINE) 10 MG tablet Take 1 tablet (10 mg total) by mouth every 6 (six) hours as needed for nausea or vomiting. (Patient not taking:  Reported on 05/27/2017) 30 tablet 1  . promethazine (PHENERGAN) 25 MG tablet Take 1 tablet (25 mg total) by mouth every 6 (six) hours as needed for nausea or vomiting. (Patient not taking: Reported on 05/27/2017) 30 tablet 3  . pseudoephedrine (SUDAFED) 30 MG tablet Take 30 mg by mouth as directed.    Marland Kitchen XIIDRA 5 % SOLN INSTILL 1 DROP INTO BOTH EYES TWICE A DAY  6   No current facility-administered medications for this visit.    Facility-Administered  Medications Ordered in Other Visits  Medication Dose Route Frequency Provider Last Rate Last Dose  . denosumab (XGEVA) injection 120 mg  120 mg Subcutaneous Once Shadad, Firas N, MD      . leuprolide (LUPRON) injection 30 mg  30 mg Intramuscular Once Shadad, [name], MD      . Radium Ra 223 Dichloride (XOFIGO) SOLN 118.2 micro curie  118.2 micro curie Intravenous Once Tyler Pita, MD         Allergies: No Known Allergies  Past Medical History, Surgical history, Social history: Reviewed and remain unchanged.   Physical Exam: Blood pressure 124/77, pulse 77, temperature 97.8 F (36.6 C), temperature source Oral, resp. rate 18, height 6\' 1"  (1.854 m), weight 179 lb 14.4 oz (81.6 kg), SpO2 100 %.    ECOG: 1 General appearance: Comfortable appearing gentleman without distress. Eyes: Sclera anicteric. Oropharynx: No oral thrush or ulcers. Lymph nodes: Cervical, axillary and supraclavicular regions without lymphadenopathy. Heart: Regular rate and rhythm without murmurs or gallops. Lung: Clear in all lung fields without wheezes or dullness to percussion.  No rhonchi auscultated. Abdomin: Soft, nontender, nondistended with good bowel sounds.  No rebound or guarding. Neurological: no motor or sensory deficits.  Intact deep tendon reflexes. Skin: No ecchymosis or petechiae.  Mild irritation noted around his Port-A-Cath site. Musculoskeletal: No clubbing or cyanosis.  Psychiatric: No anxiety or depression.   Lab Results:  CBC    Component Value Date/Time   WBC 4.8 06/18/2017 0953   WBC 5.6 05/15/2017 0932   RBC 4.34 06/18/2017 0953   HGB 11.9 (L) 05/15/2017 0932   HGB 12.2 (L) 03/13/2017 0904   HCT 37.6 (L) 06/18/2017 0953   HCT 37.8 (L) 03/13/2017 0904   PLT 188 06/18/2017 0953   PLT 207 03/13/2017 0904   MCV 86.7 06/18/2017 0953   MCV 92.0 03/13/2017 0904   MCH 28.7 06/18/2017 0953   MCHC 33.0 06/18/2017 0953   RDW 14.5 06/18/2017 0953   RDW 15.4 (H) 03/13/2017 0904    LYMPHSABS 1.3 06/18/2017 0953   LYMPHSABS 1.2 03/13/2017 0904   MONOABS 0.5 06/18/2017 0953   MONOABS 0.4 03/13/2017 0904   EOSABS 0.2 06/18/2017 0953   EOSABS 0.0 03/13/2017 0904   BASOSABS 0.0 06/18/2017 0953   BASOSABS 0.0 03/13/2017 0904   '    Chemistry      Component Value Date/Time   NA 140 06/18/2017 0953   NA 142 03/13/2017 0904   K 4.5 06/18/2017 0953   K 4.4 03/13/2017 0904   CL 109 06/18/2017 0953   CO2 24 06/18/2017 0953   CO2 22 03/13/2017 0904   BUN 19 06/18/2017 0953   BUN 14.9 03/13/2017 0904   CREATININE 1.12 06/18/2017 0953   CREATININE 1.0 03/13/2017 0904      Component Value Date/Time   CALCIUM 10.2 06/18/2017 0953   CALCIUM 9.1 03/13/2017 0904   ALKPHOS 63 06/18/2017 0953   ALKPHOS 67 03/13/2017 0904   AST 14 06/18/2017 0953  AST 9 03/13/2017 0904   ALT 8 06/18/2017 0953   ALT 7 03/13/2017 0904   BILITOT 0.3 06/18/2017 0953   BILITOT 0.32 03/13/2017 0904        Results for Cohea, SEARCY MIYOSHI (MRN [id number]) as of 06/20/2017 15:25  Ref. Range 04/02/2017 08:03 05/15/2017 10:20 06/18/2017 09:53  Prostate Specific Ag, Serum Latest Ref Range: 0.0 - 4.0 ng/mL 14.9 (H) 18.8 (H) 47.9 (H)         Impression and Plan:  72 year old man with  1.  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  He is status post multiple therapies outlined above. He developed castration resistant disease in 2017 and progressed on Xtandi in 2018.  He is currently on Xofigo and started the first therapy on June 17, 2017.  Therapy has been well-tolerated without any major complications.  His.  Has increased to 47.9.  The natural course of his disease as well as treatment for his prostate cancer options were reviewed.  He is currently receiving Xofigo and the plan is to complete 6 months of therapy unless new complaints or issues arise.  He understands his PSA will not likely reflect the benefit he is gaining from this therapy.   Switching to systemic chemotherapy will be considered he develops symptomatic progression.  2. Anorexia: His appetite is improved and his weight is stable.  3. Androgen depravation: Risks and benefits of long-term Lupron was discussed today.  He will receive Lupron at 30 mg today and repeated every 4 months.   4.  Osteoporosis prevention and prevention of skeletal related events: Risks and benefits of bone directed therapy was discussed today.  Long-term complications include osteonecrosis of the jaw and hypocalcemia were reviewed.  Complications without history of it could include bone pain and pathological fractures.  For the time being he would like to defer this option.  He tolerated Xofigo poorly in the past and Zometa could be used instead.  5. IV access: Port-A-Cath remain in use and will be flushed periodically.   6. Follow-up: Will be in 6 weeks to follow his progress.   25  minutes was spent with the patient face-to-face today.  More than 50% of time was dedicated to patient counseling, education and coordination of his multifaceted care including different treatment options.    Rexene Edison 3/28/20193:50 PM

## 2017-06-20 NOTE — Telephone Encounter (Signed)
Scheduled appt per 3/28 los - Gave patient AVS and calender per los.  

## 2017-06-21 ENCOUNTER — Telehealth: Payer: Self-pay | Admitting: *Deleted

## 2017-06-21 NOTE — Telephone Encounter (Signed)
Called patient to inform of labs and weight on 07-08-17 @ 12 pm @ [place] and his Xofigo Inj. on 07-15-17- arrival time - 11:45 am @ [facility], spoke with patient and he is aware of these appts.

## 2017-06-23 ENCOUNTER — Other Ambulatory Visit: Payer: Self-pay

## 2017-06-23 ENCOUNTER — Emergency Department: Payer: Medicare Other

## 2017-06-23 ENCOUNTER — Encounter: Payer: Self-pay | Admitting: Emergency Medicine

## 2017-06-23 ENCOUNTER — Emergency Department
Admission: EM | Admit: 2017-06-23 | Discharge: 2017-06-23 | Disposition: A | Discharge status: Home / Self Care | Payer: Medicare Other | Attending: Emergency Medicine | Admitting: Emergency Medicine

## 2017-06-23 DIAGNOSIS — J101 Influenza due to other identified influenza virus with other respiratory manifestations: Secondary | ICD-10-CM | POA: Diagnosis not present

## 2017-06-23 DIAGNOSIS — C7951 Secondary malignant neoplasm of bone: Secondary | ICD-10-CM | POA: Diagnosis not present

## 2017-06-23 DIAGNOSIS — Z7982 Long term (current) use of aspirin: Secondary | ICD-10-CM | POA: Insufficient documentation

## 2017-06-23 DIAGNOSIS — Z87891 Personal history of nicotine dependence: Secondary | ICD-10-CM | POA: Diagnosis not present

## 2017-06-23 DIAGNOSIS — C61 Malignant neoplasm of prostate: Secondary | ICD-10-CM | POA: Diagnosis not present

## 2017-06-23 DIAGNOSIS — R509 Fever, unspecified: Secondary | ICD-10-CM | POA: Diagnosis not present

## 2017-06-23 LAB — LACTIC ACID, PLASMA: Lactic Acid, Venous: 1.3 mmol/L (ref 0.5–1.9)

## 2017-06-23 LAB — CBC WITH DIFFERENTIAL/PLATELET
Basophils Absolute: 0 10*3/uL (ref 0–0.1)
Basophils Relative: 0 %
Eosinophils Absolute: 0.1 10*3/uL (ref 0–0.7)
Eosinophils Relative: 1 %
HEMATOCRIT: 33.9 % — AB (ref 40.0–52.0)
HEMOGLOBIN: 11.3 g/dL — AB (ref 13.0–18.0)
LYMPHS ABS: 0.8 10*3/uL — AB (ref 1.0–3.6)
Lymphocytes Relative: 18 %
MCH: 28.6 pg (ref 26.0–34.0)
MCHC: 33.4 g/dL (ref 32.0–36.0)
MCV: 85.4 fL (ref 80.0–100.0)
MONOS PCT: 13 %
Monocytes Absolute: 0.6 10*3/uL (ref 0.2–1.0)
NEUTROS ABS: 3.3 10*3/uL (ref 1.4–6.5)
NEUTROS PCT: 68 %
Platelets: 174 10*3/uL (ref 150–440)
RBC: 3.96 MIL/uL — AB (ref 4.40–5.90)
RDW: 14.2 % (ref 11.5–14.5)
WBC: 4.8 10*3/uL (ref 3.8–10.6)

## 2017-06-23 LAB — COMPREHENSIVE METABOLIC PANEL
ALBUMIN: 3.8 g/dL (ref 3.5–5.0)
ALT: 9 U/L — AB (ref 17–63)
AST: 12 U/L — AB (ref 15–41)
Alkaline Phosphatase: 54 U/L (ref 38–126)
Anion gap: 9 (ref 5–15)
BILIRUBIN TOTAL: 0.7 mg/dL (ref 0.3–1.2)
BUN: 19 mg/dL (ref 6–20)
CHLORIDE: 105 mmol/L (ref 101–111)
CO2: 22 mmol/L (ref 22–32)
CREATININE: 1.2 mg/dL (ref 0.61–1.24)
Calcium: 9 mg/dL (ref 8.9–10.3)
GFR calc Af Amer: >60 mL/min (ref >60)
GFR, EST NON AFRICAN AMERICAN: 60 mL/min — AB (ref >60)
GLUCOSE: 114 mg/dL — AB (ref 65–99)
Potassium: 3.9 mmol/L (ref 3.5–5.1)
Sodium: 136 mmol/L (ref 135–145)
Total Protein: 7 g/dL (ref 6.5–8.1)

## 2017-06-23 LAB — INFLUENZA PANEL BY PCR (TYPE A & B)
Influenza A By PCR: POSITIVE — AB
Influenza B By PCR: NEGATIVE

## 2017-06-23 MED ORDER — OSELTAMIVIR PHOSPHATE 75 MG PO CAPS
75.0000 mg | ORAL_CAPSULE | Freq: Once | ORAL | Status: AC
  Administered 2017-06-23: 75 mg via ORAL
  Filled 2017-06-23: qty 1

## 2017-06-23 MED ORDER — HEPARIN SOD (PORK) LOCK FLUSH 100 UNIT/ML IV SOLN
INTRAVENOUS | Status: AC
  Administered 2017-06-23: 500 [IU] via INTRAVENOUS
  Filled 2017-06-23: qty 5

## 2017-06-23 MED ORDER — OSELTAMIVIR PHOSPHATE 75 MG PO CAPS: 75.0000 mg | ORAL_CAPSULE | Freq: Two times a day (BID) | ORAL | 0 refills | Status: AC

## 2017-06-23 MED ORDER — ONDANSETRON HCL 4 MG PO TABS: 4.0000 mg | ORAL_TABLET | Freq: Three times a day (TID) | ORAL | 0 refills | Status: AC | PRN

## 2017-06-23 MED ORDER — HEPARIN SOD (PORK) LOCK FLUSH 100 UNIT/ML IV SOLN
500.0000 [IU] | Freq: Once | INTRAVENOUS | Status: AC
  Administered 2017-06-23: 500 [IU] via INTRAVENOUS

## 2017-06-23 NOTE — ED Notes (Signed)
Pt discharged to home.  Family member driving.  Discharge instructions reviewed.  Verbalized understanding.  No questions or concerns at this time.  Teach back verified.  Pt in NAD.  No items left in ED.   

## 2017-06-23 NOTE — ED Notes (Signed)
FIRST NURSE NOTE-here for flu like symptoms but just received RT for prostate CA. ambulatory without distress

## 2017-06-23 NOTE — ED Provider Notes (Signed)
[facility] Emergency Department Provider Note ____________________________________________   First MD Initiated Contact with Patient 06/23/17 1211     (approximate)  I have reviewed the triage vital signs and the nursing notes.   HISTORY  Chief Complaint Fever and Generalized Body Aches    HPI [name] is a 72 y.o. male with past medical history as noted below including prostate cancer for which she is currently being treated who presents with fever for the last several days, subjective, associated with body aches, decreased appetite, one episode of vomiting, as well as with cough and chest congestion.  Patient states his last chemotherapy was in December, and his initial radiation treatment was 6 days ago.   Past Medical History:  Diagnosis Date  . Diverticulosis of colon   . Hx of colonic polyps   . Prostate cancer [facility])     Patient Active Problem List   Diagnosis Date Noted  . Port-A-Cath in place 06/18/2017  . Encounter for antineoplastic chemotherapy 11/21/2016  . Acute seasonal allergic rhinitis due to pollen 12/28/2015  . Prostate cancer metastatic to multiple sites ([place]) 12/24/2014  . Advance directive discussed with patient 12/24/2014  . Prostate cancer ([place]) 02/23/2014  . Benign paroxysmal positional vertigo 12/07/2013  . Back pain 12/09/2012  . Goals of care, counseling/discussion 07/14/2010  . DIVERTICULOSIS, COLON 10/08/2006    Past Surgical History:  Procedure Laterality Date  . CARDIOVASCULAR STRESS TEST  1996   Negative  . IR FLUORO GUIDE PORT INSERTION RIGHT  01/09/2017  . IR US GUIDE VASC ACCESS RIGHT  01/09/2017  . PORTACATH PLACEMENT      Prior to Admission medications   Medication Sig Start Date End Date Taking? Authorizing Provider  aspirin 81 MG tablet Take 81 mg by mouth every morning.     [provider]  dexamethasone (DECADRON) 4 MG tablet Take one tablet twice a day. Start 2 days after  chemotherapy for 5 days. Patient not taking: Reported on 05/27/2017 01/23/17   Wyatt Portela, MD  diphenhydramine-acetaminophen (TYLENOL PM) 25-500 MG TABS tablet Take 2 tablets by mouth at bedtime as needed. 08/19/15   [provider]  lidocaine-prilocaine (EMLA) cream Apply 1 application topically as needed. Apply to portacath on the day of chemotherapy. Patient not taking: Reported on 05/27/2017 01/23/17   Wyatt Portela, MD  loratadine (CLARITIN) 10 MG tablet Take 10 mg by mouth daily as needed for allergies.    [provider]  ondansetron (ZOFRAN) 4 MG tablet Take 1 tablet (4 mg total) by mouth every 8 (eight) hours as needed for nausea or vomiting. Patient not taking: Reported on 05/27/2017 07/26/16   Wyatt Portela, MD  ondansetron (ZOFRAN) 4 MG tablet Take 1 tablet (4 mg total) by mouth every 8 (eight) hours as needed for up to 5 days for nausea or vomiting. 06/23/17 06/28/17  Arta Silence, MD  oseltamivir (TAMIFLU) 75 MG capsule Take 1 capsule (75 mg total) by mouth 2 (two) times daily for 7 days. 06/23/17 06/30/17  Arta Silence, MD  prochlorperazine (COMPAZINE) 10 MG tablet Take 1 tablet (10 mg total) by mouth every 6 (six) hours as needed for nausea or vomiting. Patient not taking: Reported on 05/27/2017 08/10/16   Wyatt Portela, MD  promethazine (PHENERGAN) 25 MG tablet Take 1 tablet (25 mg total) by mouth every 6 (six) hours as needed for nausea or vomiting. Patient not taking: Reported on 05/27/2017 03/13/17   Wyatt Portela, MD  pseudoephedrine (SUDAFED) 30 MG tablet Take 30 mg by mouth as directed. 07/21/15   [provider]  XIIDRA 5 % SOLN INSTILL 1 DROP INTO BOTH EYES TWICE A DAY 04/15/17   [provider]    Allergies Patient has no known allergies.  Family History  Problem Relation Age of Onset  . Heart attack Father   . Heart disease Father   . Heart attack Brother 25  . Heart disease Brother   . Heart attack Brother   . Heart  disease Brother   . Cancer Sister        breast  . Hypertension Mother   . Diabetes Sister   . Cancer Maternal Aunt   . Cancer Maternal Aunt   . Asthma Son     Social History Social History   Tobacco Use  . Smoking status: Former Smoker    Packs/day: 1.00    Years: 10.00    Pack years: 10.00    Types: Cigarettes    Last attempt to quit: 03/26/1990    Years since quitting: 27.2  . Smokeless tobacco: Never Used  Substance Use Topics  . Alcohol use: No    Frequency: Never  . Drug use: No    Review of Systems  Constitutional: Positive for fever. Eyes: No redness. ENT: Positive for nasal congestion. Cardiovascular: Denies chest pain. Respiratory: Positive for shortness of breath. Gastrointestinal: No diarrhea.  Genitourinary: Negative for dysuria.  Musculoskeletal: Positive for body aches. Skin: Negative for rash. Neurological: Negative for headache.   ____________________________________________   PHYSICAL EXAM:  VITAL SIGNS: ED Triage Vitals  Enc Vitals Group     BP 06/23/17 1141 129/81     Pulse Rate 06/23/17 1141 94     Resp 06/23/17 1141 16     Temp 06/23/17 1141 99.1 F (37.3 C)     Temp Source 06/23/17 1141 Oral     SpO2 06/23/17 1141 96 %     Weight 06/23/17 1141 179 lb (81.2 kg)     Height 06/23/17 1141 6' (1.829 m)     Head Circumference --      Peak Flow --      Pain Score 06/23/17 1147 0     Pain Loc --      Pain Edu? --      Excl. in [place]? --     Constitutional: Alert and oriented.  Relatively comfortable appearing. Eyes: Conjunctivae are normal.  Head: Atraumatic. Nose: Mild nasal congestion. Mouth/Throat: Mucous membranes are somewhat dry.   Neck: Normal range of motion.  Cardiovascular: Normal rate, regular rhythm. Grossly normal heart sounds.  Good peripheral circulation. Respiratory: Normal respiratory effort.  No retractions. Lungs CTAB. Gastrointestinal: No distention.  Musculoskeletal: No lower extremity edema.  Extremities warm  and well perfused.  Neurologic:  Normal speech and language. No gross focal neurologic deficits are appreciated.  Skin:  Skin is warm and dry. No rash noted. Psychiatric: Mood and affect are normal. Speech and behavior are normal.  ____________________________________________   LABS (all labs ordered are listed, but only abnormal results are displayed)  Labs Reviewed  CBC WITH DIFFERENTIAL/PLATELET - Abnormal; Notable for the following components:      Result Value   RBC 3.96 (*)    Hemoglobin 11.3 (*)    HCT 33.9 (*)    Lymphs Abs 0.8 (*)    All other components within normal limits  INFLUENZA PANEL BY PCR (TYPE A & B) - Abnormal; Notable for the following components:  Influenza A By PCR POSITIVE (*)    All other components within normal limits  COMPREHENSIVE METABOLIC PANEL - Abnormal; Notable for the following components:   Glucose, Bld 114 (*)    AST 12 (*)    ALT 9 (*)    GFR calc non Af Amer 60 (*)    All other components within normal limits  CULTURE, BLOOD (ROUTINE X 2)  CULTURE, BLOOD (ROUTINE X 2)  LACTIC ACID, PLASMA  URINALYSIS, COMPLETE (UACMP) WITH MICROSCOPIC  LACTIC ACID, PLASMA   ____________________________________________  EKG   ____________________________________________  RADIOLOGY  CXR: No focal infiltrate  ____________________________________________   PROCEDURES  Procedure(s) performed: No  Procedures  Critical Care performed: No ____________________________________________   INITIAL IMPRESSION / ASSESSMENT AND PLAN / ED COURSE  Pertinent labs & imaging results that were available during my care of the patient were reviewed by me and considered in my medical decision making (see chart for details).  72 year old male with past medical history as noted below and status post first radiation treatment for prostate cancer 6 days ago presents with fever, cough, chest congestion, body aches, and malaise for the last several days.  Past  medical records reviewed in Epic and above history was confirmed.  On exam, the patient is relatively well-appearing, vitals are normal except for borderline low-grade fever, and the remainder the exam is as described above.  Differential is primarily infectious, including influenza, other viral bronchitis, pneumonia, or less likely UTI or other source.  We will obtain labs and sepsis workup, give symptomatic treatment, and reassess.    ----------------------------------------- 2:53 PM on 06/23/2017 -----------------------------------------  Chest x-ray shows no focal infiltrate.  Lab workup is reassuring except for influenza A which is positive.  This is consistent with the patient's symptoms.  At this time, given normal vital signs, normal lactate, no clinical evidence of sepsis, the patient is appropriate for discharge home.  However given his age and potential immunocompromise related to cancer treatment, patient is an appropriate candidate for Tamiflu even though the symptoms have been for more than 48 hours.  I discussed this with the patient and he agrees.  He feels well to go home.  Return precautions given, and the patient expresses understanding.  ____________________________________________   FINAL CLINICAL IMPRESSION(S) / ED DIAGNOSES  Final diagnoses:  Influenza A      NEW MEDICATIONS STARTED DURING THIS VISIT:  New Prescriptions   ONDANSETRON (ZOFRAN) 4 MG TABLET    Take 1 tablet (4 mg total) by mouth every 8 (eight) hours as needed for up to 5 days for nausea or vomiting.   OSELTAMIVIR (TAMIFLU) 75 MG CAPSULE    Take 1 capsule (75 mg total) by mouth 2 (two) times daily for 7 days.     Note:  This document was prepared using Dragon voice recognition software and may include unintentional dictation errors.     Arta Silence, MD 06/23/17 [phone]

## 2017-06-23 NOTE — ED Triage Notes (Signed)
Pt states he took first radiation tx on Monday, states on Thursday night c/o generalized body aches, subjective fevers, and decreased appetite. Pt able to drink some liquids. Pt did vomit x 1.  Tmax at home 101.1.  Pt is seen at [facility] in [place].    Pt has prostate cancer.  Pt has hx of chemo.  Currently getting radiation once per month.

## 2017-06-23 NOTE — Discharge Instructions (Addendum)
Take the Tamiflu as prescribed and finish the full course.  Follow-up with your primary care doctor and oncologist within the next 1-2 weeks.  You can take the Zofran for nausea if needed.  Return to the emergency department for new, worsening, or persistent weakness, fever, body aches, vomiting, inability to take your medication, shortness of breath, or any other new or worsening symptoms that concern you.

## 2017-06-25 ENCOUNTER — Telehealth: Payer: Self-pay

## 2017-06-25 NOTE — Telephone Encounter (Signed)
Copied from [place] [phone]. Topic: Quick Communication - Office Called Patient >> Jun 25, 2017 12:01 PM Pilar Grammes, CMA wrote: Reason for CRM: Called pt to see how he was feeling after ER visit 06-21-17 for positive flu.  PEC, please ask how he is doing and let me know. Thanks

## 2017-06-28 LAB — CULTURE, BLOOD (ROUTINE X 2)
CULTURE: NO GROWTH
CULTURE: NO GROWTH
Special Requests: ADEQUATE
Special Requests: ADEQUATE

## 2017-07-05 ENCOUNTER — Telehealth: Payer: Self-pay | Admitting: *Deleted

## 2017-07-05 NOTE — Telephone Encounter (Signed)
CALLED PATIENT TO INFORM OF LABS AND WEIGHT ON 07-08-17 @ 12 PM, SPOKE WITH PATIENT AND HE IS AWARE OF THIS APPT.

## 2017-07-08 ENCOUNTER — Inpatient Hospital Stay: Payer: Medicare Other | Attending: Oncology

## 2017-07-08 ENCOUNTER — Ambulatory Visit
Admission: RE | Admit: 2017-07-08 | Discharge: 2017-07-08 | Disposition: A | Discharge status: Home / Self Care | Payer: Medicare Other | Source: Ambulatory Visit | Attending: Radiation Oncology | Admitting: Radiation Oncology

## 2017-07-08 DIAGNOSIS — C61 Malignant neoplasm of prostate: Secondary | ICD-10-CM | POA: Insufficient documentation

## 2017-07-08 DIAGNOSIS — Z95828 Presence of other vascular implants and grafts: Secondary | ICD-10-CM

## 2017-07-08 DIAGNOSIS — Z452 Encounter for adjustment and management of vascular access device: Secondary | ICD-10-CM | POA: Insufficient documentation

## 2017-07-08 DIAGNOSIS — C7952 Secondary malignant neoplasm of bone marrow: Secondary | ICD-10-CM

## 2017-07-08 DIAGNOSIS — C7951 Secondary malignant neoplasm of bone: Secondary | ICD-10-CM | POA: Diagnosis not present

## 2017-07-08 DIAGNOSIS — Z79818 Long term (current) use of other agents affecting estrogen receptors and estrogen levels: Secondary | ICD-10-CM | POA: Diagnosis not present

## 2017-07-08 DIAGNOSIS — Z9221 Personal history of antineoplastic chemotherapy: Secondary | ICD-10-CM | POA: Diagnosis not present

## 2017-07-08 LAB — CBC WITH DIFFERENTIAL (CANCER CENTER ONLY)
Basophils Absolute: 0 10*3/uL (ref 0.0–0.1)
Basophils Relative: 0 %
EOS ABS: 0.1 10*3/uL (ref 0.0–0.5)
EOS PCT: 2 %
HCT: 36.8 % — ABNORMAL LOW (ref 38.4–49.9)
HEMOGLOBIN: 12.1 g/dL — AB (ref 13.0–17.1)
Lymphocytes Relative: 34 %
Lymphs Abs: 1.6 10*3/uL (ref 0.9–3.3)
MCH: 28.4 pg (ref 27.2–33.4)
MCHC: 32.9 g/dL (ref 32.0–36.0)
MCV: 86.4 fL (ref 79.3–98.0)
MONO ABS: 0.5 10*3/uL (ref 0.1–0.9)
MONOS PCT: 10 %
Neutro Abs: 2.5 10*3/uL (ref 1.5–6.5)
Neutrophils Relative %: 54 %
PLATELETS: 277 10*3/uL (ref 140–400)
RBC: 4.26 MIL/uL (ref 4.20–5.82)
RDW: 13.9 % (ref 11.0–14.6)
WBC Count: 4.6 10*3/uL (ref 4.0–10.3)

## 2017-07-08 MED ORDER — SODIUM CHLORIDE 0.9% FLUSH
10.0000 mL | Freq: Once | INTRAVENOUS | Status: AC
  Administered 2017-07-08: 10 mL
  Filled 2017-07-08: qty 10

## 2017-07-08 MED ORDER — HEPARIN SOD (PORK) LOCK FLUSH 100 UNIT/ML IV SOLN
500.0000 [IU] | Freq: Once | INTRAVENOUS | Status: AC
  Administered 2017-07-08: 500 [IU]
  Filled 2017-07-08: qty 5

## 2017-07-11 ENCOUNTER — Telehealth: Payer: Self-pay | Admitting: *Deleted

## 2017-07-11 NOTE — Telephone Encounter (Signed)
CALLED PATIENT TO REMIND OF XOFIGO INJ. FOR 07-15-17 - ARRIVAL TIME - 11:45 AM @ [facility], SPOKE WITH PATIENT AND HE IS AWARE OF THIS INJ.

## 2017-07-15 ENCOUNTER — Encounter (HOSPITAL_COMMUNITY)
Admission: RE | Admit: 2017-07-15 | Discharge: 2017-07-15 | Disposition: A | Discharge status: Home / Self Care | Payer: Medicare Other | Source: Ambulatory Visit | Attending: Radiation Oncology | Admitting: Radiation Oncology

## 2017-07-15 DIAGNOSIS — C7951 Secondary malignant neoplasm of bone: Secondary | ICD-10-CM | POA: Insufficient documentation

## 2017-07-15 DIAGNOSIS — C61 Malignant neoplasm of prostate: Secondary | ICD-10-CM | POA: Diagnosis not present

## 2017-07-15 MED ORDER — RADIUM RA 223 DICHLORIDE 30 MCCI/ML IV SOLN
116.8200 | Freq: Once | INTRAVENOUS | Status: AC
  Administered 2017-07-15: 116.82 via INTRAVENOUS

## 2017-07-15 NOTE — Progress Notes (Signed)
  Radiation Oncology         [phone] ________________________________  Name: [name] MRN: [id number]  Date: 07/15/2017  DOB: [birth year]  Radium-223 Infusion Note  Diagnosis:  Castration resistant prostate cancer with painful bone involvement  Current Infusion:    2  Planned Infusions:  6  Narrative: [name] presented to nuclear medicine for treatment. His most recent blood counts were reviewed.  He remains a good candidate to proceed with Ra-223.  The patient was situated in an infusion suite with a contact barrier placed under his arm. Intravenous access was established, using sterile technique, and a normal saline infusion from a syringe was started.  Micro-dosimetry:  The prescribed radiation activity was assayed and confirmed to be within specified tolerance.  Special Treatment Procedure - Infusion:  The nuclear medicine technologist and I personally verified the dose activity to be delivered as specified in the written directive, and verified the patient identification via 2 separate methods.  The syringe containing the dose was attached to a 3 way stopcock, and then the valve was opened to the patient, and the dose delivered over a minute. No complications were noted.  The total administered dose was 120.0 microcuries in a volume of 5 cc.   A saline flush of the line and the syringe that contained the isotope was then performed.  The residual radioactivity in the syringe was 3.18 microcuries, so the actual infused isotope activity was 116.82 microcuries.   Pressure was applied to the venipuncture site, and a compression bandage placed.   Radiation Safety personnel were present to perform the discharge survey, as detailed on their documentation.   After a short period of observation, the patient had his IV removed.  Impression:  The patient tolerated his infusion relatively well.  Plan:  The patient will return in one month for ongoing care.      ________________________________  Sheral Apley. Tammi Klippel, M.D.  This document serves as a record of services personally performed by Tyler Pita, MD. It was created on his behalf by Rae Lips, a trained medical scribe. The creation of this record is based on the scribe's personal observations and the provider's statements to them. This document has been checked and approved by the attending provider.

## 2017-07-16 ENCOUNTER — Other Ambulatory Visit: Payer: Self-pay | Admitting: Radiation Oncology

## 2017-07-16 ENCOUNTER — Encounter: Payer: Self-pay | Admitting: Oncology

## 2017-07-16 DIAGNOSIS — C7951 Secondary malignant neoplasm of bone: Principal | ICD-10-CM

## 2017-07-16 DIAGNOSIS — C61 Malignant neoplasm of prostate: Secondary | ICD-10-CM

## 2017-07-17 ENCOUNTER — Telehealth: Payer: Self-pay | Admitting: *Deleted

## 2017-07-17 ENCOUNTER — Other Ambulatory Visit: Payer: Self-pay | Admitting: Radiation Oncology

## 2017-07-17 DIAGNOSIS — C7952 Secondary malignant neoplasm of bone marrow: Principal | ICD-10-CM

## 2017-07-17 DIAGNOSIS — C7951 Secondary malignant neoplasm of bone: Secondary | ICD-10-CM

## 2017-07-17 NOTE — Telephone Encounter (Signed)
Patient states he has so much nausea, but is able to eat.patient asked if okay to take 2 phenergan 25 mg tablets?  Discouraged taking 2 phenergan,  Suggested he take a zofran first thing in the morning. Then alternate compazine and phenergan on 6 hour intervals. States he is receiving radiation and it has been "rough"   Also suggested he check with dr Chiropodist for any other suggestions.

## 2017-07-17 NOTE — Telephone Encounter (Signed)
Agree 

## 2017-07-18 ENCOUNTER — Other Ambulatory Visit: Payer: Self-pay | Admitting: *Deleted

## 2017-07-18 DIAGNOSIS — C61 Malignant neoplasm of prostate: Secondary | ICD-10-CM

## 2017-07-18 MED ORDER — ONDANSETRON HCL 4 MG PO TABS: 4.0000 mg | ORAL_TABLET | Freq: Three times a day (TID) | ORAL | 2 refills | Status: DC | PRN

## 2017-08-05 ENCOUNTER — Inpatient Hospital Stay: Payer: Medicare Other

## 2017-08-05 ENCOUNTER — Inpatient Hospital Stay: Payer: Medicare Other | Attending: Oncology

## 2017-08-05 DIAGNOSIS — Z79818 Long term (current) use of other agents affecting estrogen receptors and estrogen levels: Secondary | ICD-10-CM | POA: Insufficient documentation

## 2017-08-05 DIAGNOSIS — C7951 Secondary malignant neoplasm of bone: Secondary | ICD-10-CM | POA: Diagnosis not present

## 2017-08-05 DIAGNOSIS — R634 Abnormal weight loss: Secondary | ICD-10-CM | POA: Diagnosis not present

## 2017-08-05 DIAGNOSIS — Z452 Encounter for adjustment and management of vascular access device: Secondary | ICD-10-CM | POA: Diagnosis not present

## 2017-08-05 DIAGNOSIS — Z79899 Other long term (current) drug therapy: Secondary | ICD-10-CM | POA: Insufficient documentation

## 2017-08-05 DIAGNOSIS — R110 Nausea: Secondary | ICD-10-CM | POA: Insufficient documentation

## 2017-08-05 DIAGNOSIS — C61 Malignant neoplasm of prostate: Secondary | ICD-10-CM | POA: Diagnosis not present

## 2017-08-05 DIAGNOSIS — Z9221 Personal history of antineoplastic chemotherapy: Secondary | ICD-10-CM | POA: Diagnosis not present

## 2017-08-05 DIAGNOSIS — R630 Anorexia: Secondary | ICD-10-CM | POA: Insufficient documentation

## 2017-08-05 DIAGNOSIS — Z7982 Long term (current) use of aspirin: Secondary | ICD-10-CM | POA: Diagnosis not present

## 2017-08-05 DIAGNOSIS — Z95828 Presence of other vascular implants and grafts: Secondary | ICD-10-CM

## 2017-08-05 LAB — CBC WITH DIFFERENTIAL (CANCER CENTER ONLY)
Basophils Absolute: 0 10*3/uL (ref 0.0–0.1)
Basophils Relative: 1 %
EOS PCT: 1 %
Eosinophils Absolute: 0 10*3/uL (ref 0.0–0.5)
HEMATOCRIT: 32.2 % — AB (ref 38.4–49.9)
Hemoglobin: 10.7 g/dL — ABNORMAL LOW (ref 13.0–17.1)
LYMPHS ABS: 1.1 10*3/uL (ref 0.9–3.3)
LYMPHS PCT: 31 %
MCH: 27.9 pg (ref 27.2–33.4)
MCHC: 33.3 g/dL (ref 32.0–36.0)
MCV: 83.9 fL (ref 79.3–98.0)
MONO ABS: 0.5 10*3/uL (ref 0.1–0.9)
MONOS PCT: 13 %
NEUTROS ABS: 2 10*3/uL (ref 1.5–6.5)
Neutrophils Relative %: 54 %
Platelet Count: 249 10*3/uL (ref 140–400)
RBC: 3.84 MIL/uL — ABNORMAL LOW (ref 4.20–5.82)
RDW: 15 % — AB (ref 11.0–14.6)
WBC Count: 3.7 10*3/uL — ABNORMAL LOW (ref 4.0–10.3)

## 2017-08-05 LAB — CMP (CANCER CENTER ONLY)
ALBUMIN: 3.4 g/dL — AB (ref 3.5–5.0)
ALT: 9 U/L (ref 0–55)
ANION GAP: 8 (ref 3–11)
AST: 11 U/L (ref 5–34)
Alkaline Phosphatase: 59 U/L (ref 40–150)
BUN: 19 mg/dL (ref 7–26)
CHLORIDE: 108 mmol/L (ref 98–109)
CO2: 24 mmol/L (ref 22–29)
Calcium: 9.7 mg/dL (ref 8.4–10.4)
Creatinine: 1.18 mg/dL (ref 0.70–1.30)
GFR, Est AFR Am: >60 mL/min (ref >60)
GFR, Estimated: >60 mL/min (ref >60)
GLUCOSE: 86 mg/dL (ref 70–140)
POTASSIUM: 4.4 mmol/L (ref 3.5–5.1)
SODIUM: 140 mmol/L (ref 136–145)
Total Bilirubin: 0.2 mg/dL (ref 0.2–1.2)
Total Protein: 7.1 g/dL (ref 6.4–8.3)

## 2017-08-05 MED ORDER — SODIUM CHLORIDE 0.9% FLUSH
10.0000 mL | Freq: Once | INTRAVENOUS | Status: AC
  Administered 2017-08-05: 10 mL
  Filled 2017-08-05: qty 10

## 2017-08-05 MED ORDER — HEPARIN SOD (PORK) LOCK FLUSH 100 UNIT/ML IV SOLN
500.0000 [IU] | Freq: Once | INTRAVENOUS | Status: AC
  Administered 2017-08-05: 500 [IU]
  Filled 2017-08-05: qty 5

## 2017-08-06 LAB — PROSTATE-SPECIFIC AG, SERUM (LABCORP): PROSTATE SPECIFIC AG, SERUM: 84.1 ng/mL — AB (ref 0.0–4.0)

## 2017-08-07 ENCOUNTER — Telehealth: Payer: Self-pay | Admitting: *Deleted

## 2017-08-07 ENCOUNTER — Telehealth: Payer: Self-pay

## 2017-08-07 ENCOUNTER — Inpatient Hospital Stay (HOSPITAL_BASED_OUTPATIENT_CLINIC_OR_DEPARTMENT_OTHER): Payer: Medicare Other | Admitting: Oncology

## 2017-08-07 VITALS — BP 114/95 | HR 72 | Temp 98.1°F | Resp 17 | Ht 72.0 in | Wt 170.4 lb

## 2017-08-07 DIAGNOSIS — Z7982 Long term (current) use of aspirin: Secondary | ICD-10-CM | POA: Diagnosis not present

## 2017-08-07 DIAGNOSIS — Z79899 Other long term (current) drug therapy: Secondary | ICD-10-CM

## 2017-08-07 DIAGNOSIS — R634 Abnormal weight loss: Secondary | ICD-10-CM | POA: Diagnosis not present

## 2017-08-07 DIAGNOSIS — C7951 Secondary malignant neoplasm of bone: Secondary | ICD-10-CM

## 2017-08-07 DIAGNOSIS — Z79818 Long term (current) use of other agents affecting estrogen receptors and estrogen levels: Secondary | ICD-10-CM

## 2017-08-07 DIAGNOSIS — Z9221 Personal history of antineoplastic chemotherapy: Secondary | ICD-10-CM

## 2017-08-07 DIAGNOSIS — C61 Malignant neoplasm of prostate: Secondary | ICD-10-CM | POA: Diagnosis not present

## 2017-08-07 DIAGNOSIS — R110 Nausea: Secondary | ICD-10-CM | POA: Diagnosis not present

## 2017-08-07 DIAGNOSIS — Z452 Encounter for adjustment and management of vascular access device: Secondary | ICD-10-CM | POA: Diagnosis not present

## 2017-08-07 DIAGNOSIS — R630 Anorexia: Secondary | ICD-10-CM | POA: Diagnosis not present

## 2017-08-07 MED ORDER — MEGESTROL ACETATE 400 MG/10ML PO SUSP: 400.0000 mg | Freq: Two times a day (BID) | ORAL | 0 refills | Status: DC

## 2017-08-07 MED ORDER — PROMETHAZINE HCL 25 MG PO TABS: 25.0000 mg | ORAL_TABLET | Freq: Four times a day (QID) | ORAL | 3 refills | Status: AC | PRN

## 2017-08-07 MED ORDER — ONDANSETRON HCL 4 MG PO TABS: 4.0000 mg | ORAL_TABLET | Freq: Three times a day (TID) | ORAL | 2 refills | Status: AC | PRN

## 2017-08-07 NOTE — Telephone Encounter (Signed)
Printed avs and calender of upcoming appointment. Per 5/15 los

## 2017-08-07 NOTE — Telephone Encounter (Signed)
Called patient to remind of lab and weight for 08-08-17, spoke with patient and he is aware of this appt.

## 2017-08-07 NOTE — Progress Notes (Signed)
Hematology and Oncology Follow Up Visit  [name] [id number] 08/16/1945 72 y.o. 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  He was initially diagnosed in with Gleason score 4+5 = 9 and a PSA of 116.    Prior Therapy:  Androgen deprivation therapy in addition to systemic chemotherapy in the form of Taxotere 75 mg/m every 3 weeks with plans for total of 6 cycles. Cycle 1 given on 05/05/2014. He is status post 6 cycles of therapy concluded on 08/20/2014.  He developed castration resistant disease in 2017.  Xtandi 160 mg daily started on 07/25/2015. Therapy discontinued in May 2018 because of progression of disease.  Jevtana chemotherapy started on 08/08/2016. He received 25 mg/m cycle 1 and then 20 mg/m in subsequent cycles.  Last cycle of chemotherapy given on 03/13/2017 for a total of 11 cycles.  Current therapy:  Androgen deprivation in the form of Lupron.  He received Lupron on February 19, 2017.  Xgeva monthly injections started 02/02/2016.   Xofigo monthly injections started on June 17, 2017.  He is status post 2 months of therapy.   Interim History:  [name] presents today for a follow-up visit.  Since the last visit, he reports no major changes in his health but few complaints.  He did report mild nausea and anorexia.  He lost close to 10 pounds since the last visit.  He reports his nausea has been erratic without any vomiting or abdominal pain.  He denies any bone pain or pathological fractures.  He reports some of his GI complaints are related to Xofigo injection at times.  Despite that, he still enjoys a reasonable quality of life.   He does not report any headaches, blurry vision or syncope or seizures.  He does not report any fevers, chills, or night sweats.  He does not report any chest pain, palpitation, orthopnea or leg edema. He  does not report any cough, hemoptysis or hematemesis. He does not report any hematochezia, melena.  He does not report any frequency, urgency or hesitancy.  He does not report any arthralgias, myalgias.  He does not report any skin rashes or lesions.  He does not report any lymphadenopathy or petechiae.  He denies any paralysis or myalgias.  He denies any anxiety or depression.  Remainder of his review of systems is negative.  Medications: I have reviewed the patient's current medications.   Current Outpatient Medications  Medication Sig Dispense Refill  . aspirin 81 MG tablet Take 81 mg by mouth every morning.     Marland Kitchen dexamethasone (DECADRON) 4 MG tablet Take one tablet twice a day. Start 2 days after chemotherapy for 5 days. (Patient not taking: Reported on 05/27/2017) 30 tablet 3  . diphenhydramine-acetaminophen (TYLENOL PM) 25-500 MG TABS tablet Take 2 tablets by mouth at bedtime as needed.    . lidocaine-prilocaine (EMLA) cream Apply 1 application topically as needed. Apply to portacath on the day of chemotherapy. (Patient not taking: Reported on 05/27/2017) 30 g 3  . loratadine (CLARITIN) 10 MG tablet Take 10 mg by mouth daily as needed for allergies.    Marland Kitchen ondansetron (ZOFRAN) 4 MG tablet Take 1 tablet (4 mg total) by mouth every 8 (eight) hours as needed for nausea or vomiting. 30 tablet 2  . prochlorperazine (COMPAZINE) 10 MG tablet Take 1 tablet (10 mg total) by mouth every 6 (six) hours as  needed for nausea or vomiting. (Patient not taking: Reported on 05/27/2017) 30 tablet 1  . promethazine (PHENERGAN) 25 MG tablet Take 1 tablet (25 mg total) by mouth every 6 (six) hours as needed for nausea or vomiting. (Patient not taking: Reported on 05/27/2017) 30 tablet 3  . pseudoephedrine (SUDAFED) 30 MG tablet Take 30 mg by mouth as directed.    Marland Kitchen XIIDRA 5 % SOLN INSTILL 1 DROP INTO BOTH EYES TWICE A DAY  6   No current facility-administered medications for this visit.    Facility-Administered Medications  Ordered in Other Visits  Medication Dose Route Frequency Provider Last Rate Last Dose  . denosumab (XGEVA) injection 120 mg  120 mg Subcutaneous Once Panayiotis [place], [name], MD         Allergies: No Known Allergies  Past Medical History, Surgical history, Social history: Reviewed and remain unchanged.   Physical Exam:  Blood pressure (!) 114/95, pulse 72, temperature 98.1 F (36.7 C), temperature source Oral, resp. rate 17, height 6' (1.829 m), weight 170 lb 6.4 oz (77.3 kg), SpO2 100 %.    ECOG: 1 General appearance: Alert, awake gentleman without distress. Eyes: Pulls are equal and round reactive to light. Oropharynx: No oral thrush or ul.. Lymph nodes: No lymphadenopathy noted in the cervical, axillary and supraclavicular areas. Heart: Regular rate without any murmurs or gallops.   Lung: Clear to auscultation in all lung fields. Abdomin: Soft, nontender without any rebound or guarding. Neurological: Intact motor and sensory and deep tendon reflexes. Skin: No erythema or induration. Musculoskeletal: No joint deformity or effusion.  Full range of motion noted. Psychiatric: Appropriate mood and affect.   Lab Results:  CBC    Component Value Date/Time   WBC 3.7 (L) 08/05/2017 1026   WBC 4.8 06/23/2017 1220   RBC 3.84 (L) 08/05/2017 1026   HGB 10.7 (L) 08/05/2017 1026   HGB 12.2 (L) 03/13/2017 0904   HCT 32.2 (L) 08/05/2017 1026   HCT 37.8 (L) 03/13/2017 0904   PLT 249 08/05/2017 1026   PLT 207 03/13/2017 0904   MCV 83.9 08/05/2017 1026   MCV 92.0 03/13/2017 0904   MCH 27.9 08/05/2017 1026   MCHC 33.3 08/05/2017 1026   RDW 15.0 (H) 08/05/2017 1026   RDW 15.4 (H) 03/13/2017 0904   LYMPHSABS 1.1 08/05/2017 1026   LYMPHSABS 1.2 03/13/2017 0904   MONOABS 0.5 08/05/2017 1026   MONOABS 0.4 03/13/2017 0904   EOSABS 0.0 08/05/2017 1026   EOSABS 0.0 03/13/2017 0904   BASOSABS 0.0 08/05/2017 1026   BASOSABS 0.0 03/13/2017 0904   '    Chemistry      Component Value  Date/Time   NA 140 08/05/2017 1026   NA 142 03/13/2017 0904   K 4.4 08/05/2017 1026   K 4.4 03/13/2017 0904   CL 108 08/05/2017 1026   CO2 24 08/05/2017 1026   CO2 22 03/13/2017 0904   BUN 19 08/05/2017 1026   BUN 14.9 03/13/2017 0904   CREATININE 1.18 08/05/2017 1026   CREATININE 1.0 03/13/2017 0904      Component Value Date/Time   CALCIUM 9.7 08/05/2017 1026   CALCIUM 9.1 03/13/2017 0904   ALKPHOS 59 08/05/2017 1026   ALKPHOS 67 03/13/2017 0904   AST 11 08/05/2017 1026   AST 9 03/13/2017 0904   ALT 9 08/05/2017 1026   ALT 7 03/13/2017 0904   BILITOT 0.2 08/05/2017 1026   BILITOT 0.32 03/13/2017 0904        Results for Riviello,  SALEEM COCCIA (MRN [id number]) as of 08/07/2017 14:45  Ref. Range 05/15/2017 10:20 06/18/2017 09:53 08/05/2017 10:26  Prostate Specific Ag, Serum Latest Ref Range: 0.0 - 4.0 ng/mL 18.8 (H) 47.9 (H) 84.1 (H)          Impression and Plan:  72 year old man with  1. Castration-resistant metastatic prostate cancer with disease in the bone with his initial diagnosis in 2017.  He is status post therapy outlined above including systemic chemotherapy that concluded in December 2018.  He is currently receiving Xofigo without complications and received 2 infusions.  His PSA continues to rise which is not surprising on the current therapy.  Risks and benefits of continuing this approach versus discontinuation of Xofigo and proceeding with chemotherapy was discussed.  To discussion today, the plan is to continue with the same regimen at this time to complete 6 months of Xofigo.  Different salvage therapy will be needed after that likely will return to chemotherapy.  2. Anorexia: I given prescription for Megace with instructions to use it.  3. Androgen depravation: His last Lupron was given on 06/20/2017 and will be repeated every 4 months.  4.  Osteoporosis prevention and prevention of skeletal related events: He has been on Xgeva in the past that has been on  hold.  He will consider restarting Zometa in the future.  5. IV access: Port-A-Cath remains in use and flushed periodically without any issues.  6.  Nausea: Etiology is unclear could be related to [facility] or worsening malignancy.  I refilled his Zofran and Phenergan with instructions how to use these medication including around-the-clock use to prevent nausea from occurring.  7. Follow-up: Will be in 6 weeks for follow-up.   25  minutes was spent with the patient face-to-face today.  More than 50% of time was dedicated to patient counseling, education and answering questions regarding future plan of care.   Rexene Edison 5/15/20192:54 PM

## 2017-08-08 ENCOUNTER — Ambulatory Visit
Admission: RE | Admit: 2017-08-08 | Discharge: 2017-08-08 | Disposition: A | Discharge status: Home / Self Care | Payer: Medicare Other | Source: Ambulatory Visit | Attending: Radiation Oncology | Admitting: Radiation Oncology

## 2017-08-08 ENCOUNTER — Inpatient Hospital Stay: Payer: Medicare Other

## 2017-08-08 DIAGNOSIS — C7952 Secondary malignant neoplasm of bone marrow: Secondary | ICD-10-CM

## 2017-08-08 DIAGNOSIS — Z79818 Long term (current) use of other agents affecting estrogen receptors and estrogen levels: Secondary | ICD-10-CM | POA: Diagnosis not present

## 2017-08-08 DIAGNOSIS — C7951 Secondary malignant neoplasm of bone: Secondary | ICD-10-CM | POA: Insufficient documentation

## 2017-08-08 DIAGNOSIS — Z9221 Personal history of antineoplastic chemotherapy: Secondary | ICD-10-CM | POA: Diagnosis not present

## 2017-08-08 DIAGNOSIS — Z95828 Presence of other vascular implants and grafts: Secondary | ICD-10-CM

## 2017-08-08 DIAGNOSIS — R110 Nausea: Secondary | ICD-10-CM | POA: Diagnosis not present

## 2017-08-08 DIAGNOSIS — C61 Malignant neoplasm of prostate: Secondary | ICD-10-CM | POA: Diagnosis not present

## 2017-08-08 DIAGNOSIS — Z452 Encounter for adjustment and management of vascular access device: Secondary | ICD-10-CM | POA: Diagnosis not present

## 2017-08-08 LAB — CBC WITH DIFFERENTIAL (CANCER CENTER ONLY)
BASOS ABS: 0 10*3/uL (ref 0.0–0.1)
Basophils Relative: 1 %
EOS PCT: 2 %
Eosinophils Absolute: 0.1 10*3/uL (ref 0.0–0.5)
HEMATOCRIT: 32.6 % — AB (ref 38.4–49.9)
Hemoglobin: 10.8 g/dL — ABNORMAL LOW (ref 13.0–17.1)
LYMPHS ABS: 1.3 10*3/uL (ref 0.9–3.3)
LYMPHS PCT: 35 %
MCH: 27.9 pg (ref 27.2–33.4)
MCHC: 33.2 g/dL (ref 32.0–36.0)
MCV: 84 fL (ref 79.3–98.0)
Monocytes Absolute: 0.3 10*3/uL (ref 0.1–0.9)
Monocytes Relative: 10 %
Neutro Abs: 1.9 10*3/uL (ref 1.5–6.5)
Neutrophils Relative %: 52 %
PLATELETS: 320 10*3/uL (ref 140–400)
RBC: 3.88 MIL/uL — ABNORMAL LOW (ref 4.20–5.82)
RDW: 14.9 % — ABNORMAL HIGH (ref 11.0–14.6)
WBC: 3.6 10*3/uL — AB (ref 4.0–10.3)

## 2017-08-08 MED ORDER — SODIUM CHLORIDE 0.9% FLUSH
10.0000 mL | Freq: Once | INTRAVENOUS | Status: AC
  Administered 2017-08-08: 10 mL
  Filled 2017-08-08: qty 10

## 2017-08-08 MED ORDER — HEPARIN SOD (PORK) LOCK FLUSH 100 UNIT/ML IV SOLN
500.0000 [IU] | Freq: Once | INTRAVENOUS | Status: DC
  Filled 2017-08-08: qty 5

## 2017-08-14 ENCOUNTER — Telehealth: Payer: Self-pay | Admitting: *Deleted

## 2017-08-14 NOTE — Telephone Encounter (Signed)
Called patient to remind of Xofiogo inj.for 08-15-17- arrival time- 10:15 am @ [facility], spoke with patient and he is aware of this inj.

## 2017-08-15 ENCOUNTER — Ambulatory Visit (HOSPITAL_COMMUNITY)
Admission: RE | Admit: 2017-08-15 | Discharge: 2017-08-15 | Disposition: A | Discharge status: Home / Self Care | Payer: Medicare Other | Source: Ambulatory Visit | Attending: Radiation Oncology | Admitting: Radiation Oncology

## 2017-08-15 DIAGNOSIS — C7951 Secondary malignant neoplasm of bone: Secondary | ICD-10-CM | POA: Diagnosis not present

## 2017-08-15 DIAGNOSIS — C61 Malignant neoplasm of prostate: Secondary | ICD-10-CM | POA: Diagnosis not present

## 2017-08-26 ENCOUNTER — Telehealth: Payer: Self-pay | Admitting: *Deleted

## 2017-08-26 ENCOUNTER — Other Ambulatory Visit: Payer: Self-pay | Admitting: Radiation Oncology

## 2017-08-26 DIAGNOSIS — C7951 Secondary malignant neoplasm of bone: Principal | ICD-10-CM

## 2017-08-26 DIAGNOSIS — C61 Malignant neoplasm of prostate: Secondary | ICD-10-CM

## 2017-08-26 NOTE — Telephone Encounter (Signed)
CALLED PATIENT TO INFORM OF LAB AND WEIGHT FOR 09-12-17 AND HIS XOFIGO INJ. FOR 09-19-17, LVM FOR A RETURN CALL

## 2017-08-28 ENCOUNTER — Other Ambulatory Visit: Payer: Self-pay | Admitting: Radiation Oncology

## 2017-08-28 DIAGNOSIS — C7951 Secondary malignant neoplasm of bone: Secondary | ICD-10-CM

## 2017-08-28 DIAGNOSIS — C7952 Secondary malignant neoplasm of bone marrow: Principal | ICD-10-CM

## 2017-09-11 ENCOUNTER — Telehealth: Payer: Self-pay | Admitting: *Deleted

## 2017-09-11 ENCOUNTER — Telehealth: Payer: Self-pay | Admitting: Oncology

## 2017-09-11 NOTE — Telephone Encounter (Signed)
Called pt re appts being added per 6/19 sch msg - spoke w/ pt re appts.

## 2017-09-11 NOTE — Telephone Encounter (Signed)
Called patient to remind of lab and weight for 09-12-17, spoke with patient and he is aware of this appt.

## 2017-09-12 ENCOUNTER — Ambulatory Visit
Admission: RE | Admit: 2017-09-12 | Discharge: 2017-09-12 | Disposition: A | Discharge status: Home / Self Care | Payer: Medicare Other | Source: Ambulatory Visit | Attending: Radiation Oncology | Admitting: Radiation Oncology

## 2017-09-12 ENCOUNTER — Inpatient Hospital Stay: Payer: Medicare Other | Attending: Oncology

## 2017-09-12 ENCOUNTER — Inpatient Hospital Stay: Payer: Medicare Other

## 2017-09-12 DIAGNOSIS — G629 Polyneuropathy, unspecified: Secondary | ICD-10-CM | POA: Insufficient documentation

## 2017-09-12 DIAGNOSIS — Z7982 Long term (current) use of aspirin: Secondary | ICD-10-CM | POA: Diagnosis not present

## 2017-09-12 DIAGNOSIS — C61 Malignant neoplasm of prostate: Secondary | ICD-10-CM | POA: Diagnosis not present

## 2017-09-12 DIAGNOSIS — C7951 Secondary malignant neoplasm of bone: Secondary | ICD-10-CM | POA: Insufficient documentation

## 2017-09-12 DIAGNOSIS — R630 Anorexia: Secondary | ICD-10-CM | POA: Insufficient documentation

## 2017-09-12 DIAGNOSIS — M810 Age-related osteoporosis without current pathological fracture: Secondary | ICD-10-CM | POA: Insufficient documentation

## 2017-09-12 DIAGNOSIS — Z79899 Other long term (current) drug therapy: Secondary | ICD-10-CM | POA: Insufficient documentation

## 2017-09-12 DIAGNOSIS — C7952 Secondary malignant neoplasm of bone marrow: Principal | ICD-10-CM

## 2017-09-12 DIAGNOSIS — R110 Nausea: Secondary | ICD-10-CM | POA: Insufficient documentation

## 2017-09-12 DIAGNOSIS — Z9221 Personal history of antineoplastic chemotherapy: Secondary | ICD-10-CM | POA: Diagnosis not present

## 2017-09-12 DIAGNOSIS — Z95828 Presence of other vascular implants and grafts: Secondary | ICD-10-CM

## 2017-09-12 LAB — CBC WITH DIFFERENTIAL (CANCER CENTER ONLY)
BASOS ABS: 0 10*3/uL (ref 0.0–0.1)
Basophils Relative: 0 %
EOS ABS: 0.1 10*3/uL (ref 0.0–0.5)
EOS PCT: 2 %
HCT: 36.2 % — ABNORMAL LOW (ref 38.4–49.9)
Hemoglobin: 12 g/dL — ABNORMAL LOW (ref 13.0–17.1)
LYMPHS ABS: 1.3 10*3/uL (ref 0.9–3.3)
LYMPHS PCT: 29 %
MCH: 28.4 pg (ref 27.2–33.4)
MCHC: 33.2 g/dL (ref 32.0–36.0)
MCV: 85.5 fL (ref 79.3–98.0)
MONO ABS: 0.5 10*3/uL (ref 0.1–0.9)
Monocytes Relative: 10 %
Neutro Abs: 2.7 10*3/uL (ref 1.5–6.5)
Neutrophils Relative %: 59 %
PLATELETS: 248 10*3/uL (ref 140–400)
RBC: 4.23 MIL/uL (ref 4.20–5.82)
RDW: 17.5 % — AB (ref 11.0–14.6)
WBC Count: 4.6 10*3/uL (ref 4.0–10.3)

## 2017-09-12 MED ORDER — ALBUTEROL SULFATE (2.5 MG/3ML) 0.083% IN NEBU
2.5000 mg | INHALATION_SOLUTION | Freq: Once | RESPIRATORY_TRACT | Status: DC | PRN
  Filled 2017-09-12: qty 3

## 2017-09-12 MED ORDER — EPINEPHRINE HCL 0.1 MG/ML IJ SOLN: 0.2500 mg | Freq: Once | INTRAMUSCULAR | Status: DC | PRN

## 2017-09-12 MED ORDER — DIPHENHYDRAMINE HCL 50 MG/ML IJ SOLN: 25.0000 mg | Freq: Once | INTRAMUSCULAR | Status: DC | PRN

## 2017-09-12 MED ORDER — HEPARIN SOD (PORK) LOCK FLUSH 100 UNIT/ML IV SOLN
250.0000 [IU] | Freq: Once | INTRAVENOUS | Status: AC
  Administered 2017-09-12: 250 [IU]
  Filled 2017-09-12: qty 5

## 2017-09-12 MED ORDER — SODIUM CHLORIDE 0.9 % IV SOLN: Freq: Once | INTRAVENOUS | Status: DC | PRN

## 2017-09-12 MED ORDER — METHYLPREDNISOLONE SODIUM SUCC 125 MG IJ SOLR: 125.0000 mg | Freq: Once | INTRAMUSCULAR | Status: DC | PRN

## 2017-09-12 MED ORDER — DIPHENHYDRAMINE HCL 50 MG/ML IJ SOLN: 50.0000 mg | Freq: Once | INTRAMUSCULAR | Status: DC | PRN

## 2017-09-12 MED ORDER — SODIUM CHLORIDE 0.9% FLUSH
10.0000 mL | Freq: Once | INTRAVENOUS | Status: AC
Start: 2017-09-12 — End: 2017-09-12
  Administered 2017-09-12: 10 mL
  Filled 2017-09-12: qty 10

## 2017-09-16 ENCOUNTER — Inpatient Hospital Stay: Payer: Medicare Other

## 2017-09-16 DIAGNOSIS — G629 Polyneuropathy, unspecified: Secondary | ICD-10-CM | POA: Diagnosis not present

## 2017-09-16 DIAGNOSIS — C61 Malignant neoplasm of prostate: Secondary | ICD-10-CM | POA: Diagnosis not present

## 2017-09-16 DIAGNOSIS — Z9221 Personal history of antineoplastic chemotherapy: Secondary | ICD-10-CM | POA: Diagnosis not present

## 2017-09-16 DIAGNOSIS — R630 Anorexia: Secondary | ICD-10-CM | POA: Diagnosis not present

## 2017-09-16 DIAGNOSIS — Z95828 Presence of other vascular implants and grafts: Secondary | ICD-10-CM

## 2017-09-16 DIAGNOSIS — R110 Nausea: Secondary | ICD-10-CM | POA: Diagnosis not present

## 2017-09-16 DIAGNOSIS — C7951 Secondary malignant neoplasm of bone: Secondary | ICD-10-CM | POA: Diagnosis not present

## 2017-09-16 LAB — CMP (CANCER CENTER ONLY)
ALT: <6 U/L (ref 0–55)
AST: 12 U/L (ref 5–34)
Albumin: 4.2 g/dL (ref 3.5–5.0)
Alkaline Phosphatase: 66 U/L (ref 40–150)
Anion gap: 9 (ref 3–11)
BILIRUBIN TOTAL: 0.3 mg/dL (ref 0.2–1.2)
BUN: 17 mg/dL (ref 7–26)
CALCIUM: 9.7 mg/dL (ref 8.4–10.4)
CO2: 22 mmol/L (ref 22–29)
CREATININE: 1.19 mg/dL (ref 0.70–1.30)
Chloride: 110 mmol/L — ABNORMAL HIGH (ref 98–109)
GFR, Est AFR Am: >60 mL/min (ref >60)
Glucose, Bld: 91 mg/dL (ref 70–140)
POTASSIUM: 4 mmol/L (ref 3.5–5.1)
Sodium: 141 mmol/L (ref 136–145)
TOTAL PROTEIN: 7 g/dL (ref 6.4–8.3)

## 2017-09-16 LAB — CBC WITH DIFFERENTIAL (CANCER CENTER ONLY)
BASOS ABS: 0 10*3/uL (ref 0.0–0.1)
BASOS PCT: 1 %
EOS ABS: 0.1 10*3/uL (ref 0.0–0.5)
EOS PCT: 2 %
HCT: 35 % — ABNORMAL LOW (ref 38.4–49.9)
Hemoglobin: 11.7 g/dL — ABNORMAL LOW (ref 13.0–17.1)
LYMPHS PCT: 31 %
Lymphs Abs: 1.2 10*3/uL (ref 0.9–3.3)
MCH: 28.6 pg (ref 27.2–33.4)
MCHC: 33.4 g/dL (ref 32.0–36.0)
MCV: 85.4 fL (ref 79.3–98.0)
Monocytes Absolute: 0.4 10*3/uL (ref 0.1–0.9)
Monocytes Relative: 9 %
Neutro Abs: 2.3 10*3/uL (ref 1.5–6.5)
Neutrophils Relative %: 57 %
PLATELETS: 224 10*3/uL (ref 140–400)
RBC: 4.09 MIL/uL — ABNORMAL LOW (ref 4.20–5.82)
RDW: 17.4 % — ABNORMAL HIGH (ref 11.0–14.6)
WBC: 3.9 10*3/uL — AB (ref 4.0–10.3)

## 2017-09-16 MED ORDER — SODIUM CHLORIDE 0.9% FLUSH
10.0000 mL | Freq: Once | INTRAVENOUS | Status: AC
  Administered 2017-09-16: 10 mL
  Filled 2017-09-16: qty 10

## 2017-09-16 MED ORDER — HEPARIN SOD (PORK) LOCK FLUSH 100 UNIT/ML IV SOLN
250.0000 [IU] | Freq: Once | INTRAVENOUS | Status: AC
  Administered 2017-09-16: 250 [IU]
  Filled 2017-09-16: qty 5

## 2017-09-17 ENCOUNTER — Telehealth: Payer: Self-pay | Admitting: Oncology

## 2017-09-17 ENCOUNTER — Inpatient Hospital Stay (HOSPITAL_BASED_OUTPATIENT_CLINIC_OR_DEPARTMENT_OTHER): Payer: Medicare Other | Admitting: Oncology

## 2017-09-17 VITALS — BP 111/76 | HR 83 | Temp 98.0°F | Resp 18 | Ht 72.0 in | Wt 174.8 lb

## 2017-09-17 DIAGNOSIS — C7951 Secondary malignant neoplasm of bone: Secondary | ICD-10-CM

## 2017-09-17 DIAGNOSIS — R630 Anorexia: Secondary | ICD-10-CM

## 2017-09-17 DIAGNOSIS — Z9221 Personal history of antineoplastic chemotherapy: Secondary | ICD-10-CM

## 2017-09-17 DIAGNOSIS — C61 Malignant neoplasm of prostate: Secondary | ICD-10-CM

## 2017-09-17 DIAGNOSIS — G629 Polyneuropathy, unspecified: Secondary | ICD-10-CM | POA: Diagnosis not present

## 2017-09-17 DIAGNOSIS — R110 Nausea: Secondary | ICD-10-CM | POA: Diagnosis not present

## 2017-09-17 DIAGNOSIS — M810 Age-related osteoporosis without current pathological fracture: Secondary | ICD-10-CM | POA: Diagnosis not present

## 2017-09-17 DIAGNOSIS — Z79899 Other long term (current) drug therapy: Secondary | ICD-10-CM | POA: Diagnosis not present

## 2017-09-17 DIAGNOSIS — Z7982 Long term (current) use of aspirin: Secondary | ICD-10-CM

## 2017-09-17 LAB — PROSTATE-SPECIFIC AG, SERUM (LABCORP): PROSTATE SPECIFIC AG, SERUM: 88.5 ng/mL — AB (ref 0.0–4.0)

## 2017-09-17 NOTE — Telephone Encounter (Signed)
Scheduled appt per 6/25 los - gave patient aVS and calender per los.  

## 2017-09-17 NOTE — Progress Notes (Signed)
Hematology and Oncology Follow Up Visit  [name] [id number] 05-Jun-1945 72 y.o. 09/17/2017 12:29 PM   Principle Diagnosis: 72 year old man with castration-resistant metastatic prostate cancer diagnosed in 2017.  He has documented disease to the bone after initial diagnosis in October 2015 with Gleason score 4+5 = 9 and a PSA of 116.    Prior Therapy:  Androgen deprivation therapy in addition to systemic chemotherapy in the form of Taxotere 75 mg/m every 3 weeks with plans for total of 6 cycles. Cycle 1 given on 05/05/2014. He is status post 6 cycles of therapy concluded on 08/20/2014.  He developed castration resistant disease in 2017.  Xtandi 160 mg daily started on 07/25/2015. Therapy discontinued in May 2018 because of progression of disease.  Jevtana chemotherapy started on 08/08/2016. He received 25 mg/m cycle 1 and then 20 mg/m in subsequent cycles.  Last cycle of chemotherapy given on 03/13/2017 for a total of 11 cycles.  Current therapy:  Androgen deprivation in the form of Lupron.  He received Lupron on February 19, 2017.  Xgeva monthly injections started 02/02/2016.   Xofigo monthly injections started on June 17, 2017.  He is status post 3 infusions out of planned 6.   Interim History:  [name] is here for a follow-up visit.  Since the last visit, he continues to tolerate Xofigo without any complications.  He is noticing improvement in his quality of life including increased energy and exercise tolerance.  He is also able to eat better and have gained more weight.  He denies any worsening bone pain or pathological fractures.  He denies any decline in his performance status.  He does report mild back discomfort that is manageable at this time.  He denies any worsening neuropathy associated with it.  He does not report any headaches, blurry vision or syncope or seizures.  He denies any confusion or alteration of mental status.  He does not report any fevers, chills, or  night sweats.  He does not report any chest pain, palpitation, orthopnea or leg edema. He does not report any cough, hemoptysis or hematemesis. He does not report any hematochezia, melena.  He does not report any frequency, urgency or hesitancy.   He does not report any skin rashes or lesions.  He does not report any lymphadenopathy or petechiae.   He denies any changes in his mood.  Remainder of his review of systems is negative.  Medications: I have reviewed the patient's current medications.   Current Outpatient Medications  Medication Sig Dispense Refill  . aspirin 81 MG tablet Take 81 mg by mouth every morning.     Marland Kitchen dexamethasone (DECADRON) 4 MG tablet Take one tablet twice a day. Start 2 days after chemotherapy for 5 days. (Patient not taking: Reported on 05/27/2017) 30 tablet 3  . diphenhydramine-acetaminophen (TYLENOL PM) 25-500 MG TABS tablet Take 2 tablets by mouth at bedtime as needed.    . lidocaine-prilocaine (EMLA) cream Apply 1 application topically as needed. Apply to portacath on the day of chemotherapy. (Patient not taking: Reported on 05/27/2017) 30 g 3  . loratadine (CLARITIN) 10 MG tablet Take 10 mg by mouth daily as needed for allergies.    . megestrol (MEGACE) 400 MG/10ML suspension Take 10 mLs (400 mg total) by mouth 2 (two) times daily. 240 mL 0  . ondansetron (ZOFRAN) 4 MG tablet Take 1 tablet (4 mg total) by mouth every 8 (eight) hours as needed for nausea or vomiting. 30 tablet 2  .  prochlorperazine (COMPAZINE) 10 MG tablet Take 1 tablet (10 mg total) by mouth every 6 (six) hours as needed for nausea or vomiting. (Patient not taking: Reported on 05/27/2017) 30 tablet 1  . promethazine (PHENERGAN) 25 MG tablet Take 1 tablet (25 mg total) by mouth every 6 (six) hours as needed for nausea or vomiting. 60 tablet 3  . pseudoephedrine (SUDAFED) 30 MG tablet Take 30 mg by mouth as directed.    Marland Kitchen XIIDRA 5 % SOLN INSTILL 1 DROP INTO BOTH EYES TWICE A DAY  6   No current  facility-administered medications for this visit.    Facility-Administered Medications Ordered in Other Visits  Medication Dose Route Frequency Provider Last Rate Last Dose  . denosumab (XGEVA) injection 120 mg  120 mg Subcutaneous Once Shadad, [name], MD         Allergies: Not on File  Past Medical History, Surgical history, Social history: Reviewed and remain unchanged.   Physical Exam:  Blood pressure 111/76, pulse 83, temperature 98 F (36.7 C), temperature source Oral, resp. rate 18, height 6' (1.829 m), weight 174 lb 12.8 oz (79.3 kg), SpO2 100 %.    ECOG: 1 General appearance: Well-appearing gentleman without distress. Eyes: Sclera anicteric. Oropharynx: Mucous membranes are moist and pink. Lymph nodes: No cervical, axillary inguinal or supraclavicular areas. Heart: Regular rate without any murmurs or gallops.  No leg edema..   Lung: Clear that any rhonchi, wheezes or dullness to percussion. Abdomin: Soft, nontender, nondistended with good bowel sounds. Neurological: No motor or sensory deficits.. Skin: No ecchymosis or petechiae. Musculoskeletal: No clubbing or cyanosis.    Lab Results:  CBC    Component Value Date/Time   WBC 3.9 (L) 09/16/2017 1216   WBC 4.8 06/23/2017 1220   RBC 4.09 (L) 09/16/2017 1216   HGB 11.7 (L) 09/16/2017 1216   HGB 12.2 (L) 03/13/2017 0904   HCT 35.0 (L) 09/16/2017 1216   HCT 37.8 (L) 03/13/2017 0904   PLT 224 09/16/2017 1216   PLT 207 03/13/2017 0904   MCV 85.4 09/16/2017 1216   MCV 92.0 03/13/2017 0904   MCH 28.6 09/16/2017 1216   MCHC 33.4 09/16/2017 1216   RDW 17.4 (H) 09/16/2017 1216   RDW 15.4 (H) 03/13/2017 0904   LYMPHSABS 1.2 09/16/2017 1216   LYMPHSABS 1.2 03/13/2017 0904   MONOABS 0.4 09/16/2017 1216   MONOABS 0.4 03/13/2017 0904   EOSABS 0.1 09/16/2017 1216   EOSABS 0.0 03/13/2017 0904   BASOSABS 0.0 09/16/2017 1216   BASOSABS 0.0 03/13/2017 0904   '    Chemistry      Component Value Date/Time   NA 141  09/16/2017 1216   NA 142 03/13/2017 0904   K 4.0 09/16/2017 1216   K 4.4 03/13/2017 0904   CL 110 (H) 09/16/2017 1216   CO2 22 09/16/2017 1216   CO2 22 03/13/2017 0904   BUN 17 09/16/2017 1216   BUN 14.9 03/13/2017 0904   CREATININE 1.19 09/16/2017 1216   CREATININE 1.0 03/13/2017 0904      Component Value Date/Time   CALCIUM 9.7 09/16/2017 1216   CALCIUM 9.1 03/13/2017 0904   ALKPHOS 66 09/16/2017 1216   ALKPHOS 67 03/13/2017 0904   AST 12 09/16/2017 1216   AST 9 03/13/2017 0904   ALT <6 09/16/2017 1216   ALT 7 03/13/2017 0904   BILITOT 0.3 09/16/2017 1216   BILITOT 0.32 03/13/2017 0904         Results for Frankowski, MOHAMMED MCANDREW (MRN [id number])  as of 09/17/2017 12:33  Ref. Range 08/05/2017 10:26 09/16/2017 12:16  Prostate Specific Ag, Serum Latest Ref Range: 0.0 - 4.0 ng/mL 84.1 (H) 88.5 (H)         Impression and Plan:  72 year old man with  1. Castration-resistant metastatic prostate cancer agonist in 2017 after initial diagnosis with prostate cancer in 2015.  He has progressed on multiple therapies outlined above including systemic chemotherapy.  He remains on Xofigo and has tolerated therapy very well.  He continues to thrive and gain weight with improvement in his quality of life.  The natural course of this disease was reviewed today again and future treatment options were reviewed.  At this time, I have recommended completing all 6 infusions given his excellent improvement in his clinical status.  2. Anorexia: Improved since the last visit and have gained close to 4 pounds.  3. Androgen depravation: Risks and benefits of continuing this therapy was discussed today and his next Lupron will be in August 2019.  4.  Osteoporosis prevention and prevention of skeletal related events: He has declined bone directed therapy at this time.  We will continue to address that with him future visits.  5. IV access: Port-A-Cath will be flushed with every visit.  No complications  noted..  6.  Nausea: Has improved at this time and has used less nausea medicine  7. Follow-up: Will be in 2 weeks to follow his progress.   15  minutes was spent with the patient face-to-face today.  More than 50% of time was dedicated to patient counseling, education and coordinating his care.  Roxy Cedar Shadad,MD 6/25/201912:29 PM

## 2017-09-18 ENCOUNTER — Telehealth: Payer: Self-pay | Admitting: *Deleted

## 2017-09-18 NOTE — Telephone Encounter (Signed)
CALLED PATIENT TO REMIND OF XOFIGO INJ. FOR 09-19-17- ARRIVAL TIME - 12:15 PM @ [facility], SPOKE WITH PATIENT AND HE IS AWARE OF THIS INJ.

## 2017-09-19 ENCOUNTER — Encounter (HOSPITAL_COMMUNITY)
Admission: RE | Admit: 2017-09-19 | Discharge: 2017-09-19 | Disposition: A | Discharge status: Home / Self Care | Payer: Medicare Other | Source: Ambulatory Visit | Attending: Radiation Oncology | Admitting: Radiation Oncology

## 2017-09-19 DIAGNOSIS — C61 Malignant neoplasm of prostate: Secondary | ICD-10-CM | POA: Diagnosis not present

## 2017-09-19 DIAGNOSIS — C7951 Secondary malignant neoplasm of bone: Secondary | ICD-10-CM | POA: Diagnosis not present

## 2017-09-19 MED ORDER — RADIUM RA 223 DICHLORIDE 30 MCCI/ML IV SOLN
112.0000 | Freq: Once | INTRAVENOUS | Status: AC
  Administered 2017-09-19: 112 via INTRAVENOUS

## 2017-09-20 NOTE — Progress Notes (Signed)
  Radiation Oncology         (336) [phone] ________________________________  Name: [name] MRN: [id number]  Date: 09/19/2017  DOB: 1945/05/21  Radium-223 Infusion Note  Diagnosis:  Castration resistant prostate cancer with painful bone involvement  Current Infusion:    4  Planned Infusions:  6  Narrative: Mr. [name] Facemire presented to nuclear medicine for treatment. His most recent blood counts were reviewed.  He remains a good candidate to proceed with Ra-223.  The patient was situated in an infusion suite with a contact barrier placed under his arm. Intravenous access was established, using sterile technique, and a normal saline infusion from a syringe was started.  Micro-dosimetry:  The prescribed radiation activity was assayed and confirmed to be within specified tolerance.  Special Treatment Procedure - Infusion:  The nuclear medicine technologist and I personally verified the dose activity to be delivered as specified in the written directive, and verified the patient identification via 2 separate methods.  The syringe containing the dose was attached to a 3 way stopcock, and then the valve was opened to the patient, and the dose delivered over a minute. No complications were noted.  The total administered dose was 116.8 microcuries in a volume of 4.5cc.   A saline flush of the line and the syringe that contained the isotope was then performed.  The residual radioactivity in the syringe was 4.8 microcuries, so the actual infused isotope activity was 188microcuries.   Pressure was applied to the venipuncture site, and a compression bandage placed.   Radiation Safety personnel were present to perform the discharge survey, as detailed on their documentation.   After a short period of observation, the patient had his IV removed.  Impression:  The patient tolerated his infusion relatively well.  Plan:  The patient will return in one month for ongoing care.      ________________________________  Sheral Apley. Tammi Klippel, M.D.  This document serves as a record of services personally performed by Tyler Pita, MD. It was created on his behalf by Margit Banda, a trained medical scribe. The creation of this record is based on the scribe's personal observations and the provider's statements to them. This document has been checked and approved by the attending provider.

## 2017-09-24 ENCOUNTER — Other Ambulatory Visit: Payer: Self-pay | Admitting: Radiation Oncology

## 2017-09-24 ENCOUNTER — Telehealth: Payer: Self-pay | Admitting: *Deleted

## 2017-09-24 DIAGNOSIS — C7951 Secondary malignant neoplasm of bone: Principal | ICD-10-CM

## 2017-09-24 DIAGNOSIS — C61 Malignant neoplasm of prostate: Secondary | ICD-10-CM

## 2017-09-24 NOTE — Telephone Encounter (Signed)
CALLED PATIENT TO INFORM OF LAB AND WEIGHT APPT. FOR 10-16-17 @ 12 PM @ [facility]. ON 10-23-17 - ARRIVAL TIME - 11:30 AM @ [facility], SPOKE WITH PATIENT AND HE IS AWARE OF THESE APPTS.

## 2017-09-25 ENCOUNTER — Other Ambulatory Visit: Payer: Self-pay | Admitting: Radiation Oncology

## 2017-09-25 DIAGNOSIS — C61 Malignant neoplasm of prostate: Secondary | ICD-10-CM

## 2017-10-02 DIAGNOSIS — H40053 Ocular hypertension, bilateral: Secondary | ICD-10-CM | POA: Diagnosis not present

## 2017-10-07 DIAGNOSIS — H2513 Age-related nuclear cataract, bilateral: Secondary | ICD-10-CM | POA: Diagnosis not present

## 2017-10-15 ENCOUNTER — Telehealth: Payer: Self-pay | Admitting: *Deleted

## 2017-10-15 NOTE — Telephone Encounter (Signed)
CALLED PATIENT TO REMIND OF LABS AND WEIGHT FOR 10-16-17 @ 12 PM, LVM FOR A RETURN CALL

## 2017-10-16 ENCOUNTER — Ambulatory Visit
Admission: RE | Admit: 2017-10-16 | Discharge: 2017-10-16 | Disposition: A | Discharge status: Home / Self Care | Payer: Medicare Other | Source: Ambulatory Visit | Attending: Oncology | Admitting: Oncology

## 2017-10-16 ENCOUNTER — Inpatient Hospital Stay: Payer: Medicare Other | Attending: Oncology

## 2017-10-16 DIAGNOSIS — Z9221 Personal history of antineoplastic chemotherapy: Secondary | ICD-10-CM | POA: Diagnosis not present

## 2017-10-16 DIAGNOSIS — Z452 Encounter for adjustment and management of vascular access device: Secondary | ICD-10-CM | POA: Diagnosis not present

## 2017-10-16 DIAGNOSIS — Z95828 Presence of other vascular implants and grafts: Secondary | ICD-10-CM

## 2017-10-16 DIAGNOSIS — C61 Malignant neoplasm of prostate: Secondary | ICD-10-CM | POA: Insufficient documentation

## 2017-10-16 DIAGNOSIS — C7951 Secondary malignant neoplasm of bone: Secondary | ICD-10-CM | POA: Diagnosis not present

## 2017-10-16 LAB — CBC WITH DIFFERENTIAL (CANCER CENTER ONLY)
BASOS PCT: 0 %
Basophils Absolute: 0 10*3/uL (ref 0.0–0.1)
EOS ABS: 0.1 10*3/uL (ref 0.0–0.5)
Eosinophils Relative: 2 %
HCT: 34.3 % — ABNORMAL LOW (ref 38.4–49.9)
HEMOGLOBIN: 11.7 g/dL — AB (ref 13.0–17.1)
Lymphocytes Relative: 32 %
Lymphs Abs: 1.4 10*3/uL (ref 0.9–3.3)
MCH: 29.7 pg (ref 27.2–33.4)
MCHC: 34 g/dL (ref 32.0–36.0)
MCV: 87.3 fL (ref 79.3–98.0)
MONOS PCT: 12 %
Monocytes Absolute: 0.5 10*3/uL (ref 0.1–0.9)
NEUTROS PCT: 54 %
Neutro Abs: 2.4 10*3/uL (ref 1.5–6.5)
Platelet Count: 221 10*3/uL (ref 140–400)
RBC: 3.93 MIL/uL — AB (ref 4.20–5.82)
RDW: 16.3 % — ABNORMAL HIGH (ref 11.0–14.6)
WBC: 4.4 10*3/uL (ref 4.0–10.3)

## 2017-10-16 MED ORDER — HEPARIN SOD (PORK) LOCK FLUSH 100 UNIT/ML IV SOLN
500.0000 [IU] | Freq: Once | INTRAVENOUS | Status: AC
  Administered 2017-10-16: 500 [IU]
  Filled 2017-10-16: qty 5

## 2017-10-16 MED ORDER — SODIUM CHLORIDE 0.9% FLUSH
10.0000 mL | Freq: Once | INTRAVENOUS | Status: AC
  Administered 2017-10-16: 10 mL
  Filled 2017-10-16: qty 10

## 2017-10-22 ENCOUNTER — Telehealth: Payer: Self-pay | Admitting: *Deleted

## 2017-10-22 NOTE — Telephone Encounter (Signed)
Called patient to remind of Xofigo Inj. for 10-23-17- @ [facility], spoke with patient and he is aware of this inj.

## 2017-10-23 ENCOUNTER — Ambulatory Visit (HOSPITAL_COMMUNITY)
Admission: RE | Admit: 2017-10-23 | Discharge: 2017-10-23 | Disposition: A | Discharge status: Home / Self Care | Payer: Medicare Other | Source: Ambulatory Visit | Attending: Radiation Oncology | Admitting: Radiation Oncology

## 2017-10-23 DIAGNOSIS — C7951 Secondary malignant neoplasm of bone: Secondary | ICD-10-CM | POA: Insufficient documentation

## 2017-10-23 DIAGNOSIS — C61 Malignant neoplasm of prostate: Secondary | ICD-10-CM | POA: Diagnosis not present

## 2017-10-23 MED ORDER — RADIUM RA 223 DICHLORIDE 30 MCCI/ML IV SOLN
112.7000 | Freq: Once | INTRAVENOUS | Status: AC
  Administered 2017-10-23: 112.7 via INTRAVENOUS

## 2017-10-23 NOTE — Progress Notes (Signed)
  Radiation Oncology         (336) [phone] ________________________________  Name: [name] MRN: [id number]  Date: 10/23/2017  DOB: 1946/02/23  Radium-223 Infusion Note  Diagnosis:  Castration resistant prostate cancer with painful bone involvement  Current Infusion:    5  Planned Infusions:  6  Narrative: [name] presented to nuclear medicine for treatment. His most recent blood counts were reviewed.  He remains a good candidate to proceed with Ra-223.  The patient was situated in an infusion suite with a contact barrier placed under his arm. Intravenous access was established, using sterile technique, and a normal saline infusion from a syringe was started.  Micro-dosimetry:  The prescribed radiation activity was assayed and confirmed to be within specified tolerance.  Special Treatment Procedure - Infusion:  The nuclear medicine technologist and I personally verified the dose activity to be delivered as specified in the written directive, and verified the patient identification via 2 separate methods.  The syringe containing the dose was attached to a 3 way stopcock, and then the valve was opened to the patient, and the dose delivered over a minute. No complications were noted.  The total administered dose was 115.3 microcuries in a volume of 5 cc.   A saline flush of the line and the syringe that contained the isotope was then performed.  The residual radioactivity in the syringe was 2.6 microcuries, so the actual infused isotope activity was 112.7 microcuries.   Pressure was applied to the venipuncture site, and a compression bandage placed.   Radiation Safety personnel were present to perform the discharge survey, as detailed on their documentation.   After a short period of observation, the patient had his IV removed.  Impression:  The patient tolerated his infusion relatively well.  Plan:  The patient will return in one month for ongoing care.      ________________________________  Sheral Apley. Tammi Klippel, M.D.  This document serves as a record of services personally performed by Tyler Pita, MD. It was created on his behalf by Wilburn Mylar, a trained medical scribe. The creation of this record is based on the scribe's personal observations and the provider's statements to them. This document has been checked and approved by the attending provider.

## 2017-10-25 ENCOUNTER — Other Ambulatory Visit: Payer: Self-pay | Admitting: Radiation Oncology

## 2017-10-25 ENCOUNTER — Telehealth: Payer: Self-pay | Admitting: *Deleted

## 2017-10-25 DIAGNOSIS — C61 Malignant neoplasm of prostate: Secondary | ICD-10-CM

## 2017-10-25 DIAGNOSIS — C7951 Secondary malignant neoplasm of bone: Principal | ICD-10-CM

## 2017-10-25 NOTE — Telephone Encounter (Signed)
CALLED PATIENT TO INFORM OF LAB AND WEIGHT ON 11-13-17 @ [facility] AND HIS [place]. ON 11-20-17 - ARRIVAL TIME- 11:15 AM @ [facility], LVM FOR A RETURN CALL

## 2017-10-30 ENCOUNTER — Telehealth: Payer: Self-pay | Admitting: *Deleted

## 2017-10-30 ENCOUNTER — Telehealth: Payer: Self-pay | Admitting: Oncology

## 2017-10-30 ENCOUNTER — Other Ambulatory Visit: Payer: Self-pay | Admitting: *Deleted

## 2017-10-30 DIAGNOSIS — R319 Hematuria, unspecified: Secondary | ICD-10-CM

## 2017-10-30 DIAGNOSIS — Z5111 Encounter for antineoplastic chemotherapy: Secondary | ICD-10-CM

## 2017-10-30 DIAGNOSIS — C61 Malignant neoplasm of prostate: Secondary | ICD-10-CM

## 2017-10-30 DIAGNOSIS — M545 Low back pain: Secondary | ICD-10-CM

## 2017-10-30 NOTE — Telephone Encounter (Signed)
Received voice mail message from patient stating,"I'd like Dr. [name] know that I've had bloody urine, sporadically, and very painful back pain that started at the same time, about two weeks ago. I don't know what is causing this. Return number is [phone]."

## 2017-10-30 NOTE — Telephone Encounter (Signed)
Patient scheduled per 8/7 sch message. Patient aware of date and time.  °

## 2017-10-30 NOTE — Telephone Encounter (Signed)
As noted below by Dr. [name], I asked patient if he could be seen in [facility] tomorrow, 10/30/17. Patient agrees to be seen tomorrow for labs and see Sandi Mealy, PA. LOS sent to scheduling.

## 2017-10-30 NOTE — Telephone Encounter (Signed)
He needs to be seen at symptom management.

## 2017-10-31 ENCOUNTER — Inpatient Hospital Stay: Payer: Medicare Other

## 2017-10-31 ENCOUNTER — Inpatient Hospital Stay: Payer: Medicare Other | Attending: Oncology | Admitting: Medical

## 2017-10-31 ENCOUNTER — Inpatient Hospital Stay (HOSPITAL_BASED_OUTPATIENT_CLINIC_OR_DEPARTMENT_OTHER): Payer: Medicare Other | Admitting: Medical

## 2017-10-31 VITALS — BP 165/94 | HR 83 | Temp 97.8°F | Resp 18 | Ht 72.0 in | Wt 172.7 lb

## 2017-10-31 DIAGNOSIS — C7951 Secondary malignant neoplasm of bone: Secondary | ICD-10-CM | POA: Insufficient documentation

## 2017-10-31 DIAGNOSIS — C61 Malignant neoplasm of prostate: Secondary | ICD-10-CM

## 2017-10-31 DIAGNOSIS — C7989 Secondary malignant neoplasm of other specified sites: Secondary | ICD-10-CM | POA: Diagnosis not present

## 2017-10-31 DIAGNOSIS — R310 Gross hematuria: Secondary | ICD-10-CM | POA: Insufficient documentation

## 2017-10-31 DIAGNOSIS — R319 Hematuria, unspecified: Secondary | ICD-10-CM

## 2017-10-31 DIAGNOSIS — M545 Low back pain: Secondary | ICD-10-CM

## 2017-10-31 DIAGNOSIS — R600 Localized edema: Secondary | ICD-10-CM | POA: Diagnosis not present

## 2017-10-31 DIAGNOSIS — N289 Disorder of kidney and ureter, unspecified: Secondary | ICD-10-CM

## 2017-10-31 DIAGNOSIS — N1330 Unspecified hydronephrosis: Secondary | ICD-10-CM | POA: Insufficient documentation

## 2017-10-31 DIAGNOSIS — Z79899 Other long term (current) drug therapy: Secondary | ICD-10-CM | POA: Insufficient documentation

## 2017-10-31 DIAGNOSIS — R109 Unspecified abdominal pain: Secondary | ICD-10-CM | POA: Diagnosis not present

## 2017-10-31 DIAGNOSIS — Z95828 Presence of other vascular implants and grafts: Secondary | ICD-10-CM

## 2017-10-31 DIAGNOSIS — Z5111 Encounter for antineoplastic chemotherapy: Secondary | ICD-10-CM

## 2017-10-31 LAB — CMP (CANCER CENTER ONLY)
ALT: 7 U/L (ref 0–44)
AST: 8 U/L — AB (ref 15–41)
Albumin: 3.7 g/dL (ref 3.5–5.0)
Alkaline Phosphatase: 71 U/L (ref 38–126)
Anion gap: 11 (ref 5–15)
BUN: 23 mg/dL (ref 8–23)
CHLORIDE: 116 mmol/L — AB (ref 98–111)
CO2: 17 mmol/L — AB (ref 22–32)
CREATININE: 1.98 mg/dL — AB (ref 0.61–1.24)
Calcium: 9.4 mg/dL (ref 8.9–10.3)
GFR, EST AFRICAN AMERICAN: 38 mL/min — AB (ref >60)
GFR, EST NON AFRICAN AMERICAN: 33 mL/min — AB (ref >60)
Glucose, Bld: 87 mg/dL (ref 70–99)
POTASSIUM: 4.5 mmol/L (ref 3.5–5.1)
SODIUM: 144 mmol/L (ref 135–145)
Total Bilirubin: 0.3 mg/dL (ref 0.3–1.2)
Total Protein: 6.7 g/dL (ref 6.5–8.1)

## 2017-10-31 LAB — CBC WITH DIFFERENTIAL (CANCER CENTER ONLY)
Basophils Absolute: 0 10*3/uL (ref 0.0–0.1)
Basophils Relative: 0 %
EOS ABS: 0.1 10*3/uL (ref 0.0–0.5)
Eosinophils Relative: 3 %
HEMATOCRIT: 30 % — AB (ref 38.4–49.9)
Hemoglobin: 10.1 g/dL — ABNORMAL LOW (ref 13.0–17.1)
LYMPHS PCT: 24 %
Lymphs Abs: 0.9 10*3/uL (ref 0.9–3.3)
MCH: 29.5 pg (ref 27.2–33.4)
MCHC: 33.7 g/dL (ref 32.0–36.0)
MCV: 87.7 fL (ref 79.3–98.0)
MONOS PCT: 10 %
Monocytes Absolute: 0.4 10*3/uL (ref 0.1–0.9)
NEUTROS ABS: 2.4 10*3/uL (ref 1.5–6.5)
NEUTROS PCT: 63 %
Platelet Count: 214 10*3/uL (ref 140–400)
RBC: 3.42 MIL/uL — AB (ref 4.20–5.82)
RDW: 14.7 % — ABNORMAL HIGH (ref 11.0–14.6)
WBC: 3.9 10*3/uL — AB (ref 4.0–10.3)

## 2017-10-31 LAB — URINALYSIS, COMPLETE (UACMP) WITH MICROSCOPIC
Bilirubin Urine: NEGATIVE
Glucose, UA: NEGATIVE mg/dL
KETONES UR: NEGATIVE mg/dL
Leukocytes, UA: NEGATIVE
Nitrite: NEGATIVE
PROTEIN: 30 mg/dL — AB
Specific Gravity, Urine: 1.012 (ref 1.005–1.030)
pH: 5 (ref 5.0–8.0)

## 2017-10-31 LAB — SAMPLE TO BLOOD BANK

## 2017-10-31 MED ORDER — HYDROCODONE-ACETAMINOPHEN 5-325 MG PO TABS: ORAL_TABLET | ORAL | 0 refills | Status: DC

## 2017-10-31 MED ORDER — HEPARIN SOD (PORK) LOCK FLUSH 100 UNIT/ML IV SOLN
500.0000 [IU] | Freq: Once | INTRAVENOUS | Status: DC
  Administered 2017-10-31: 500 [IU]
  Filled 2017-10-31: qty 5

## 2017-10-31 MED ORDER — SODIUM CHLORIDE 0.9% FLUSH
10.0000 mL | Freq: Once | INTRAVENOUS | Status: DC
  Administered 2017-10-31: 10 mL
  Filled 2017-10-31: qty 10

## 2017-10-31 MED ORDER — SODIUM CHLORIDE 0.9 % IV SOLN
Freq: Once | INTRAVENOUS | Status: DC
  Filled 2017-10-31: qty 250

## 2017-10-31 MED ORDER — SODIUM CHLORIDE 0.9% FLUSH
10.0000 mL | Freq: Once | INTRAVENOUS | Status: AC
  Administered 2017-10-31: 10 mL
  Filled 2017-10-31: qty 10

## 2017-10-31 NOTE — Patient Instructions (Signed)

## 2017-10-31 NOTE — Patient Instructions (Signed)
Implanted Port Home Guide An implanted port is a type of central line that is placed under the skin. Central lines are used to provide IV access when treatment or nutrition needs to be given through a person's veins. Implanted ports are used for long-term IV access. An implanted port may be placed because:  You need IV medicine that would be irritating to the small veins in your hands or arms.  You need long-term IV medicines, such as antibiotics.  You need IV nutrition for a long period.  You need frequent blood draws for lab tests.  You need dialysis.  Implanted ports are usually placed in the chest area, but they can also be placed in the upper arm, the abdomen, or the leg. An implanted port has two main parts:  Reservoir. The reservoir is round and will appear as a small, raised area under your skin. The reservoir is the part where a needle is inserted to give medicines or draw blood.  Catheter. The catheter is a thin, flexible tube that extends from the reservoir. The catheter is placed into a large vein. Medicine that is inserted into the reservoir goes into the catheter and then into the vein.  How will I care for my incision site? Do not get the incision site wet. Bathe or shower as directed by your health care provider. How is my port accessed? Special steps must be taken to access the port:  Before the port is accessed, a numbing cream can be placed on the skin. This helps numb the skin over the port site.  Your health care provider uses a sterile technique to access the port. ? Your health care provider must put on a mask and sterile gloves. ? The skin over your port is cleaned carefully with an antiseptic and allowed to dry. ? The port is gently pinched between sterile gloves, and a needle is inserted into the port.  Only "non-coring" port needles should be used to access the port. Once the port is accessed, a blood return should be checked. This helps ensure that the port  is in the vein and is not clogged.  If your port needs to remain accessed for a constant infusion, a clear (transparent) bandage will be placed over the needle site. The bandage and needle will need to be changed every week, or as directed by your health care provider.  Keep the bandage covering the needle clean and dry. Do not get it wet. Follow your health care provider's instructions on how to take a shower or bath while the port is accessed.  If your port does not need to stay accessed, no bandage is needed over the port.  What is flushing? Flushing helps keep the port from getting clogged. Follow your health care provider's instructions on how and when to flush the port. Ports are usually flushed with saline solution or a medicine called heparin. The need for flushing will depend on how the port is used.  If the port is used for intermittent medicines or blood draws, the port will need to be flushed: ? After medicines have been given. ? After blood has been drawn. ? As part of routine maintenance.  If a constant infusion is running, the port may not need to be flushed.  How long will my port stay implanted? The port can stay in for as long as your health care provider thinks it is needed. When it is time for the port to come out, surgery will be   done to remove it. The procedure is similar to the one performed when the port was put in. When should I seek immediate medical care? When you have an implanted port, you should seek immediate medical care if:  You notice a bad smell coming from the incision site.  You have swelling, redness, or drainage at the incision site.  You have more swelling or pain at the port site or the surrounding area.  You have a fever that is not controlled with medicine.  This information is not intended to replace advice given to you by your health care provider. Make sure you discuss any questions you have with your health care provider. Document  Released: 03/12/2005 Document Revised: 08/18/2015 Document Reviewed: 11/17/2012 Elsevier Interactive Patient Education  2017 Elsevier Inc.  

## 2017-11-01 ENCOUNTER — Other Ambulatory Visit: Payer: Self-pay | Admitting: Radiation Oncology

## 2017-11-01 ENCOUNTER — Inpatient Hospital Stay: Payer: Medicare Other

## 2017-11-01 ENCOUNTER — Ambulatory Visit (HOSPITAL_COMMUNITY)
Admission: RE | Admit: 2017-11-01 | Discharge: 2017-11-01 | Disposition: A | Discharge status: Home / Self Care | Payer: Medicare Other | Source: Ambulatory Visit | Attending: Medical | Admitting: Medical

## 2017-11-01 VITALS — BP 148/73 | HR 80 | Temp 98.0°F | Resp 18

## 2017-11-01 DIAGNOSIS — I700 Atherosclerosis of aorta: Secondary | ICD-10-CM | POA: Insufficient documentation

## 2017-11-01 DIAGNOSIS — C7989 Secondary malignant neoplasm of other specified sites: Secondary | ICD-10-CM | POA: Diagnosis not present

## 2017-11-01 DIAGNOSIS — Z95828 Presence of other vascular implants and grafts: Secondary | ICD-10-CM

## 2017-11-01 DIAGNOSIS — R911 Solitary pulmonary nodule: Secondary | ICD-10-CM | POA: Insufficient documentation

## 2017-11-01 DIAGNOSIS — R937 Abnormal findings on diagnostic imaging of other parts of musculoskeletal system: Secondary | ICD-10-CM | POA: Diagnosis not present

## 2017-11-01 DIAGNOSIS — R109 Unspecified abdominal pain: Secondary | ICD-10-CM | POA: Diagnosis not present

## 2017-11-01 DIAGNOSIS — C61 Malignant neoplasm of prostate: Secondary | ICD-10-CM | POA: Diagnosis not present

## 2017-11-01 DIAGNOSIS — R310 Gross hematuria: Secondary | ICD-10-CM | POA: Insufficient documentation

## 2017-11-01 DIAGNOSIS — C7951 Secondary malignant neoplasm of bone: Secondary | ICD-10-CM

## 2017-11-01 DIAGNOSIS — N1330 Unspecified hydronephrosis: Secondary | ICD-10-CM | POA: Diagnosis not present

## 2017-11-01 DIAGNOSIS — N429 Disorder of prostate, unspecified: Secondary | ICD-10-CM | POA: Insufficient documentation

## 2017-11-01 DIAGNOSIS — C7952 Secondary malignant neoplasm of bone marrow: Principal | ICD-10-CM

## 2017-11-01 DIAGNOSIS — Q6211 Congenital occlusion of ureteropelvic junction: Secondary | ICD-10-CM | POA: Diagnosis not present

## 2017-11-01 LAB — URINE CULTURE: Culture: NO GROWTH

## 2017-11-01 MED ORDER — HEPARIN SOD (PORK) LOCK FLUSH 100 UNIT/ML IV SOLN
500.0000 [IU] | Freq: Once | INTRAVENOUS | Status: AC
  Administered 2017-11-01: 500 [IU] via INTRAVENOUS

## 2017-11-01 MED ORDER — SODIUM CHLORIDE 0.9 % IV SOLN
Freq: Once | INTRAVENOUS | Status: AC
  Administered 2017-11-01: 10:00:00 via INTRAVENOUS
  Filled 2017-11-01: qty 250

## 2017-11-01 MED ORDER — SODIUM CHLORIDE 0.9% FLUSH
10.0000 mL | Freq: Once | INTRAVENOUS | Status: AC
  Administered 2017-11-01: 10 mL
  Filled 2017-11-01: qty 10

## 2017-11-01 MED ORDER — HEPARIN SOD (PORK) LOCK FLUSH 100 UNIT/ML IV SOLN
500.0000 [IU] | Freq: Once | INTRAVENOUS | Status: AC
  Administered 2017-11-01: 500 [IU]
  Filled 2017-11-01: qty 5

## 2017-11-01 MED ORDER — IOPAMIDOL (ISOVUE-300) INJECTION 61%
75.0000 mL | Freq: Once | INTRAVENOUS | Status: AC | PRN
  Administered 2017-11-01: 75 mL via INTRAVENOUS

## 2017-11-01 MED ORDER — HEPARIN SOD (PORK) LOCK FLUSH 100 UNIT/ML IV SOLN
INTRAVENOUS | Status: AC
  Filled 2017-11-01: qty 5

## 2017-11-01 NOTE — Patient Instructions (Signed)
Dehydration, Adult Dehydration is when there is not enough fluid or water in your body. This happens when you lose more fluids than you take in. Dehydration can range from mild to very bad. It should be treated right away to keep it from getting very bad. Symptoms of mild dehydration may include:  Thirst.  Dry lips.  Slightly dry mouth.  Dry, warm skin.  Dizziness. Symptoms of moderate dehydration may include:  Very dry mouth.  Muscle cramps.  Dark pee (urine). Pee may be the color of tea.  Your body making less pee.  Your eyes making fewer tears.  Heartbeat that is uneven or faster than normal (palpitations).  Headache.  Light-headedness, especially when you stand up from sitting.  Fainting (syncope). Symptoms of very bad dehydration may include:  Changes in skin, such as: ? Cold and clammy skin. ? Blotchy (mottled) or pale skin. ? Skin that does not quickly return to normal after being lightly pinched and let go (poor skin turgor).  Changes in body fluids, such as: ? Feeling very thirsty. ? Your eyes making fewer tears. ? Not sweating when body temperature is high, such as in hot weather. ? Your body making very little pee.  Changes in vital signs, such as: ? Weak pulse. ? Pulse that is more than 100 beats a minute when you are sitting still. ? Fast breathing. ? Low blood pressure.  Other changes, such as: ? Sunken eyes. ? Cold hands and feet. ? Confusion. ? Lack of energy (lethargy). ? Trouble waking up from sleep. ? Short-term weight loss. ? Unconsciousness. Follow these instructions at home:  If told by your doctor, drink an ORS: ? Make an ORS by using instructions on the package. ? Start by drinking small amounts, about  cup (120 mL) every 5-10 minutes. ? Slowly drink more until you have had the amount that your doctor said to have.  Drink enough clear fluid to keep your pee clear or pale yellow. If you were told to drink an ORS, finish the ORS  first, then start slowly drinking clear fluids. Drink fluids such as: ? Water. Do not drink only water by itself. Doing that can make the salt (sodium) level in your body get too low (hyponatremia). ? Ice chips. ? Fruit juice that you have added water to (diluted). ? Low-calorie sports drinks.  Avoid: ? Alcohol. ? Drinks that have a lot of sugar. These include high-calorie sports drinks, fruit juice that does not have water added, and soda. ? Caffeine. ? Foods that are greasy or have a lot of fat or sugar.  Take over-the-counter and prescription medicines only as told by your doctor.  Do not take salt tablets. Doing that can make the salt level in your body get too high (hypernatremia).  Eat foods that have minerals (electrolytes). Examples include bananas, oranges, potatoes, tomatoes, and spinach.  Keep all follow-up visits as told by your doctor. This is important. Contact a doctor if:  You have belly (abdominal) pain that: ? Gets worse. ? Stays in one area (localizes).  You have a rash.  You have a stiff neck.  You get angry or annoyed more easily than normal (irritability).  You are more sleepy than normal.  You have a harder time waking up than normal.  You feel: ? Weak. ? Dizzy. ? Very thirsty.  You have peed (urinated) only a small amount of very dark pee during 6-8 hours. Get help right away if:  You have symptoms of   very bad dehydration.  You cannot drink fluids without throwing up (vomiting).  Your symptoms get worse with treatment.  You have a fever.  You have a very bad headache.  You are throwing up or having watery poop (diarrhea) and it: ? Gets worse. ? Does not go away.  You have blood or something green (bile) in your throw-up.  You have blood in your poop (stool). This may cause poop to look black and tarry.  You have not peed in 6-8 hours.  You pass out (faint).  Your heart rate when you are sitting still is more than 100 beats a  minute.  You have trouble breathing. This information is not intended to replace advice given to you by your health care provider. Make sure you discuss any questions you have with your health care provider. Document Released: 01/06/2009 Document Revised: 09/30/2015 Document Reviewed: 05/06/2015 Elsevier Interactive Patient Education  2018 [employer]  

## 2017-11-04 ENCOUNTER — Encounter: Payer: Self-pay | Admitting: Oncology

## 2017-11-04 ENCOUNTER — Other Ambulatory Visit: Payer: Self-pay | Admitting: Oncology

## 2017-11-04 DIAGNOSIS — C61 Malignant neoplasm of prostate: Secondary | ICD-10-CM

## 2017-11-04 NOTE — Progress Notes (Signed)
[facility] Progress Note   [name] [id number] Nov 07, 1945 72 y.o.  [name] is managed by Dr. [name]  Actively treated with chemotherapy/immunotherapy: yes  Current Therapy: monthly Delton See and Xofigo  Assessment: Plan:    Hematuria, gross - Plan: CT Abdomen Pelvis Wo Contrast, CT Abdomen Pelvis W Contrast, Ambulatory referral to Urology  Flank pain - Plan: CT Abdomen Pelvis Wo Contrast, CT Abdomen Pelvis W Contrast, Ambulatory referral to Urology  Renal insufficiency - Plan: 0.9 %  sodium chloride infusion   Gross hematuria with flank pain: [name] was referred for a CT scan to be completed on Friday.  We will await these results.  Renal insufficiency: The patient's labs returned showing a creatinine of 1.98.  He will return tomorrow for 1 L of normal saline prior to his CT scans.  Please see After Visit Summary for patient specific instructions.  Future Appointments  Date Time Provider [place]  11/13/2017 12:15 PM [facility] None  11/13/2017 12:15 PM [facility] [place] [facility] None  11/20/2017 11:30 AM [facility] 1 [facility] [place]  11/20/2017 12:00 PM Wyatt Portela, MD [facility] None  11/20/2017 12:45 PM [facility] [place] [facility] None    Orders Placed This Encounter  Procedures  . CT Abdomen Pelvis Wo Contrast  . CT Abdomen Pelvis W Contrast  . Ambulatory referral to Urology       Subjective:   Patient ID:  [name] is a 72 y.o. (DOB 1946/01/18) male.  Chief Complaint: No chief complaint on file.   HPI [name] is a 72 year old man with castration-resistant metastatic prostate cancer diagnosed in 2017 and who is managed by Dr. [name]. [name] has progressed on multiple therapies including systemic chemotherapy. He is currently treated with monthly Xgeva and Xofigo injections. [name] presents to the office today after calling yesterday with a report of gross hematuria and back pain for 2  weeks.  He has had occasional dysuria, chills, nausea and intermittent constipation and diarrhea.  He denies fevers, sweats, or vomiting.  Medications: I have reviewed the patient's current medications.  Allergies: Not on File  Past Medical History:  Diagnosis Date  . Diverticulosis of colon   . Hx of colonic polyps   . Prostate cancer [facility])     Past Surgical History:  Procedure Laterality Date  . CARDIOVASCULAR STRESS TEST  1996   Negative  . IR FLUORO GUIDE PORT INSERTION RIGHT  01/09/2017  . IR US GUIDE VASC ACCESS RIGHT  01/09/2017  . PORTACATH PLACEMENT      Family History  Problem Relation Age of Onset  . Heart attack Father   . Heart disease Father   . Heart attack Brother 46  . Heart disease Brother   . Heart attack Brother   . Heart disease Brother   . Cancer Sister        breast  . Hypertension Mother   . Diabetes Sister   . Cancer Maternal Aunt   . Cancer Maternal Aunt   . Asthma Son     Social History   Socioeconomic History  . Marital status: Married    Spouse name: Not on file  . Number of children: 3  . Years of education: Not on file  . Highest education level: Not on file  Occupational History  . Occupation: cancer registry reporting  Social Needs  . Financial resource strain: Not on file  . Food insecurity:  Worry: Not on file    Inability: Not on file  . Transportation needs:    Medical: Not on file    Non-medical: Not on file  Tobacco Use  . Smoking status: Former Smoker    Packs/day: 1.00    Years: 10.00    Pack years: 10.00    Types: Cigarettes    Last attempt to quit: 03/26/1990    Years since quitting: 27.6  . Smokeless tobacco: Never Used  Substance and Sexual Activity  . Alcohol use: No    Frequency: Never  . Drug use: No  . Sexual activity: Never  Lifestyle  . Physical activity:    Days per week: Not on file    Minutes per session: Not on file  . Stress: Not on file  Relationships  . Social connections:    Talks  on phone: Not on file    Gets together: Not on file    Attends religious service: Not on file    Active member of club or organization: Not on file    Attends meetings of clubs or organizations: Not on file    Relationship status: Not on file  . Intimate partner violence:    Fear of current or ex partner: Not on file    Emotionally abused: Not on file    Physically abused: Not on file    Forced sexual activity: Not on file  Other Topics Concern  . Not on file  Social History Narrative   Has living will   Wife is health care POA.   Would accept resuscitation    No tube feeds if cognitively unaware.    Past Medical History, Surgical history, Social history, and Family history were reviewed and updated as appropriate.   Please see review of systems for further details on the patient's review from today.   Review of Systems:  Review of Systems  Constitutional: Positive for chills. Negative for diaphoresis and fever.  Respiratory: Negative for cough and shortness of breath.   Cardiovascular: Negative for chest pain, palpitations and leg swelling.  Gastrointestinal: Positive for constipation, diarrhea and nausea. Negative for abdominal pain and vomiting.  Genitourinary: Positive for dysuria, flank pain and hematuria. Negative for difficulty urinating, frequency and urgency.  Skin: Negative for rash.  Neurological: Negative for dizziness and headaches.    Objective:   Physical Exam:  There were no vitals taken for this visit. ECOG: 0  Physical Exam  Constitutional: No distress.  HENT:  Head: Normocephalic and atraumatic.  Eyes: Conjunctivae are normal. Right eye exhibits no discharge. Left eye exhibits no discharge. No scleral icterus.  Cardiovascular: Normal rate, regular rhythm and normal heart sounds. Exam reveals no gallop and no friction rub.  No murmur heard. Pulmonary/Chest: Effort normal and breath sounds normal. No respiratory distress. He has no wheezes. He has no  rales.  Abdominal: Soft. Bowel sounds are normal. He exhibits no distension and no mass. There is no tenderness. There is no rebound and no guarding.  Musculoskeletal: He exhibits tenderness (Left CVA tenderness).  Neurological: He is alert.  Skin: Skin is warm and dry. No rash noted. He is not diaphoretic. No erythema.    Lab Review:     Component Value Date/Time   NA 144 10/31/2017 0937   NA 142 03/13/2017 0904   K 4.5 10/31/2017 0937   K 4.4 03/13/2017 0904   CL 116 (H) 10/31/2017 0937   CO2 17 (L) 10/31/2017 0937   CO2 22 03/13/2017 0904  GLUCOSE 87 10/31/2017 0937   GLUCOSE 89 03/13/2017 0904   BUN 23 10/31/2017 0937   BUN 14.9 03/13/2017 0904   CREATININE 1.98 (H) 10/31/2017 0937   CREATININE 1.0 03/13/2017 0904   CALCIUM 9.4 10/31/2017 0937   CALCIUM 9.1 03/13/2017 0904   PROT 6.7 10/31/2017 0937   PROT 6.6 03/13/2017 0904   ALBUMIN 3.7 10/31/2017 0937   ALBUMIN 3.7 03/13/2017 0904   AST 8 (L) 10/31/2017 0937   AST 9 03/13/2017 0904   ALT 7 10/31/2017 0937   ALT 7 03/13/2017 0904   ALKPHOS 71 10/31/2017 0937   ALKPHOS 67 03/13/2017 0904   BILITOT 0.3 10/31/2017 0937   BILITOT 0.32 03/13/2017 0904   GFRNONAA 33 (L) 10/31/2017 0937   GFRAA 38 (L) 10/31/2017 0937       Component Value Date/Time   WBC 3.9 (L) 10/31/2017 0937   WBC 4.8 06/23/2017 1220   RBC 3.42 (L) 10/31/2017 0937   HGB 10.1 (L) 10/31/2017 0937   HGB 12.2 (L) 03/13/2017 0904   HCT 30.0 (L) 10/31/2017 0937   HCT 37.8 (L) 03/13/2017 0904   PLT 214 10/31/2017 0937   PLT 207 03/13/2017 0904   MCV 87.7 10/31/2017 0937   MCV 92.0 03/13/2017 0904   MCH 29.5 10/31/2017 0937   MCHC 33.7 10/31/2017 0937   RDW 14.7 (H) 10/31/2017 0937   RDW 15.4 (H) 03/13/2017 0904   LYMPHSABS 0.9 10/31/2017 0937   LYMPHSABS 1.2 03/13/2017 0904   MONOABS 0.4 10/31/2017 0937   MONOABS 0.4 03/13/2017 0904   EOSABS 0.1 10/31/2017 0937   EOSABS 0.0 03/13/2017 0904   BASOSABS 0.0 10/31/2017 0937   BASOSABS 0.0  03/13/2017 0904   -------------------------------  Imaging from last 24 hours (if applicable):  Radiology interpretation: Ct Abdomen Pelvis W Contrast  Result Date: 11/01/2017 CLINICAL DATA:  Intermittent hematuria.  Prostate cancer. EXAM: CT ABDOMEN AND PELVIS WITH CONTRAST TECHNIQUE: Multidetector CT imaging of the abdomen and pelvis was performed using the standard protocol following bolus administration of intravenous contrast. CONTRAST:  66mL ISOVUE-300 IOPAMIDOL (ISOVUE-300) INJECTION 61% COMPARISON:  04/10/2016 FINDINGS: Lower chest: Scarring identified within the lingula and left lower lobe. There is a small pulmonary nodule in the left lower lobe which measures 4 mm, image 24/6. This is new compared with 04/10/2016. Hepatobiliary: Multiple low attenuation foci within the liver identified compatible with cysts. Similar to previous exam. Stones identified within the gallbladder. No gallbladder wall thickening or biliary dilatation. No choledocholithiasis. Pancreas: Unremarkable. No pancreatic ductal dilatation or surrounding inflammatory changes. Spleen: Normal in size without focal abnormality. Adrenals/Urinary Tract: Normal adrenal glands. There is bilateral hydronephrosis and hydroureter, right greater than left. Since the previous exam there has been significant cortical volume loss from the right kidney. Areas of diminished enhancement involving the upper pole of the left kidney is identified which could be seen with focal pyelonephritis or infarct. Large pelvic mass arising from the prostate gland and invading the bladder, distal right ureter and obstructing the left UVJ is identified. This demonstrates significant interval increase in size from 04/10/2016. On today's study the mass measures 8.6 by 6.7 by 4.2 cm, image 41/11 and image 56/12. This involves the posterior wall of the urinary bladder, both lateral walls of the urinary bladder and the distal 4 cm of the right ureter. On the previous  exam the tumor measured approximately 1.5 cm. Stomach/Bowel: The stomach is normal. No pathologic dilatation of the small or large bowel loops. Normal appearance of the colon. Vascular/Lymphatic:  Aortic atherosclerosis. No aneurysm. No adenopathy identified within the abdomen. New left common iliac node measures 8 mm, image 51/2. New small left pelvic sidewall node measures 8 mm, image 71/2. Reproductive: Large mass arising from the prostate gland as described above. Other: No ascites or focal fluid collections. Musculoskeletal: Scoliosis and degenerative disc disease identified. Scattered sclerotic bone metastases are identified. This includes diffuse sclerosis involving the T12 vertebra which is new from previous exam. Similar appearance of sclerotic metastasis within the S1 vertebra. Asymmetric sclerotic lesion involving the posterior aspect of the left iliac bone has increased in size from previous exam. New compression fractures are identified involving T11, L1. These are age indeterminate. IMPRESSION: 1. Interval progression of disease. 2. Marked increase in size of mass arising from the prostate gland which involves a significant portion of the urinary bladder and distal right ureter. There is also obstruction of the left UVJ. 3. New small left common iliac and left pelvic sidewall lymph nodes are suspicious for metastatic adenopathy. 4. Progression of sclerotic bone metastasis involving the lumbar spine and pelvis. 5. 4 mm left lower lobe lung nodule is new from 04/10/2016. 6. Bilateral hydronephrosis as above. Right hydronephrosis, is likely chronic as there has been progressive cortical volume loss from 04/10/2016. There is a focal area of decreased cortical enhancement of the left kidney which may represent a vascular insult or focal pyelonephritis. 7. New age-indeterminate T11 and L1 compression deformities. 8.  Aortic Atherosclerosis (ICD10-I70.0). Electronically Signed   By: Kerby Moors M.D.   On:  11/01/2017 16:50   Nm Xofigo Injection  Result Date: 10/23/2017  Trudi Ida was injected intravenously in Nuclear Medicine under the supervision of the attending radiologist        This case was discussed Dr. [name]. He expressed agreement with my management of this patient.

## 2017-11-04 NOTE — Progress Notes (Signed)
These preliminary result these preliminary results were noted.  Awaiting final report.

## 2017-11-04 NOTE — Progress Notes (Signed)
These results were called to Hennie Duos and were reviewed with him . His were answered. He expressed understanding.

## 2017-11-04 NOTE — Progress Notes (Signed)
Results of the CT scan obtained on 11/01/2017 was personally reviewed today and discussed with the patient.  The CT scan showed a an enlarging pelvic mass arising from the prostate and obstructing the left UVJ measuring 8.6 x 6.7 x 4.2 cm.  This is causing bilateral hydronephrosis with the right hydronephrosis have progressed.  Clinically he reports some abdominal discomfort but no longer has any hematuria.  His laboratory data did show increase in his creatinine.  Based on these findings, I have recommended urgent evaluation by urology and a referral to be evaluated by Dr. [name] or colleagues to be done for possible cystoscopy or potential evaluation for nephrostomy tube placement.  I also will ask Dr. Tammi Klippel to evaluate whether he would benefit from pelvic radiation to his pelvic mass.  Systemic treatment with chemotherapy would be possibility although he has been treated with the Taxotere and cabazitaxel chemotherapy recently.  I do not believe systemic therapy will impact his local symptoms acutely.  We will continue to follow his clinical status closely.

## 2017-11-06 ENCOUNTER — Other Ambulatory Visit: Payer: Self-pay | Admitting: Oncology

## 2017-11-07 ENCOUNTER — Encounter: Payer: Self-pay | Admitting: Oncology

## 2017-11-11 ENCOUNTER — Encounter: Payer: Self-pay | Admitting: Radiation Oncology

## 2017-11-11 NOTE — Progress Notes (Signed)
  Radiation Oncology         [phone] ________________________________  Name: [name] MRN: [id number]  Date: 11/11/2017  DOB: Jun 21, 1945  Chart Note:  I reviewed this patient's most recent findings and wanted to take a minute to document my impression.  Given his recent CT showing an enlarging bulky prostate mass, we will bring him in to discuss palliative radiotherapy to the pelvis.     ________________________________  Sheral Apley. Tammi Klippel, M.D.

## 2017-11-13 ENCOUNTER — Ambulatory Visit
Admission: RE | Admit: 2017-11-13 | Discharge: 2017-11-13 | Disposition: A | Discharge status: Home / Self Care | Payer: Medicare Other | Source: Ambulatory Visit | Attending: Radiation Oncology | Admitting: Radiation Oncology

## 2017-11-13 ENCOUNTER — Inpatient Hospital Stay: Payer: Medicare Other

## 2017-11-13 ENCOUNTER — Telehealth: Payer: Self-pay | Admitting: *Deleted

## 2017-11-13 DIAGNOSIS — C7989 Secondary malignant neoplasm of other specified sites: Secondary | ICD-10-CM | POA: Diagnosis not present

## 2017-11-13 DIAGNOSIS — C61 Malignant neoplasm of prostate: Secondary | ICD-10-CM | POA: Insufficient documentation

## 2017-11-13 DIAGNOSIS — N1330 Unspecified hydronephrosis: Secondary | ICD-10-CM | POA: Diagnosis not present

## 2017-11-13 DIAGNOSIS — R310 Gross hematuria: Secondary | ICD-10-CM | POA: Diagnosis not present

## 2017-11-13 DIAGNOSIS — Z95828 Presence of other vascular implants and grafts: Secondary | ICD-10-CM

## 2017-11-13 DIAGNOSIS — C7951 Secondary malignant neoplasm of bone: Secondary | ICD-10-CM | POA: Diagnosis not present

## 2017-11-13 DIAGNOSIS — C7952 Secondary malignant neoplasm of bone marrow: Secondary | ICD-10-CM

## 2017-11-13 DIAGNOSIS — R109 Unspecified abdominal pain: Secondary | ICD-10-CM | POA: Diagnosis not present

## 2017-11-13 LAB — CBC WITH DIFFERENTIAL (CANCER CENTER ONLY)
BASOS PCT: 0 %
Basophils Absolute: 0 10*3/uL (ref 0.0–0.1)
EOS ABS: 0.1 10*3/uL (ref 0.0–0.5)
EOS PCT: 1 %
HCT: 26.9 % — ABNORMAL LOW (ref 38.4–49.9)
Hemoglobin: 9 g/dL — ABNORMAL LOW (ref 13.0–17.1)
LYMPHS ABS: 0.9 10*3/uL (ref 0.9–3.3)
Lymphocytes Relative: 18 %
MCH: 29.5 pg (ref 27.2–33.4)
MCHC: 33.4 g/dL (ref 32.0–36.0)
MCV: 88.3 fL (ref 79.3–98.0)
MONOS PCT: 11 %
Monocytes Absolute: 0.5 10*3/uL (ref 0.1–0.9)
Neutro Abs: 3.5 10*3/uL (ref 1.5–6.5)
Neutrophils Relative %: 70 %
Platelet Count: 207 10*3/uL (ref 140–400)
RBC: 3.05 MIL/uL — ABNORMAL LOW (ref 4.20–5.82)
RDW: 14.4 % (ref 11.0–14.6)
WBC Count: 5.1 10*3/uL (ref 4.0–10.3)

## 2017-11-13 MED ORDER — HEPARIN SOD (PORK) LOCK FLUSH 100 UNIT/ML IV SOLN
500.0000 [IU] | Freq: Once | INTRAVENOUS | Status: AC
  Administered 2017-11-13: 500 [IU]
  Filled 2017-11-13: qty 5

## 2017-11-13 MED ORDER — SODIUM CHLORIDE 0.9% FLUSH
10.0000 mL | Freq: Once | INTRAVENOUS | Status: AC
  Administered 2017-11-13: 10 mL
  Filled 2017-11-13: qty 10

## 2017-11-13 NOTE — Telephone Encounter (Signed)
Called patient to remind of lab and weight for 11-13-17, lvm for a return call

## 2017-11-14 NOTE — Progress Notes (Signed)
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  This is  causing bilateral hydronephrosis.  [name] presented  months ago with signs/symptoms of: 11-01-17 CT scan showed a an enlarging pelvic mass arising from the prostate and obstructing the left UVJ measuring 8.6 x 6.7 x 4.2 cm.  This is causing bilateral hydronephrosis with the right hydronephrosis have progressed.   Past/Anticipated interventions by urology, if any: Dr. Alexis Frock   Recommended urgent evaluation by urology and a referral to be evaluated by Dr. [name] or colleagues to be done for possible cystoscopy or potential evaluation for nephrostomy tube placement per Dr. [name].  Will check with patient about appointment date.   Past/Anticipated interventions by medical oncology, if any: Dr. [name]  11-04-17 I have recommended urgent evaluation by urology and a referral to be evaluated by Dr. [name] or colleagues to be done for possible cystoscopy or potential evaluation for nephrostomy tube placement.  I also will ask Dr. Tammi Klippel to evaluate whether he would benefit from pelvic radiation to his pelvic mass.   Current therapy:  Androgen deprivation in the form of Lupron. He received Lupron on February 19, 2017 and will be due again March 2019 (q58months). Patient believes he takes Lupron every six months.  Xgeva monthly injections started11/11/2015. Reports he hasn't received Xgeva in six weeks.   Prior Therapy: Androgen deprivation therapy in addition tosystemic chemotherapy in the form of Taxotere 75 mg/m every 3 weeks with plans for total of 6 cycles. Cycle 1 given on 05/05/2014. He is status post 6 cycles of therapy concluded on 08/20/2014.He developed castration resistant disease in 2017  Xtandi 160 mg daily started on 07/25/2015. Therapy discontinued in May 2018 because  of progression of disease.  Jevtana chemotherapy started on 08/08/2016. He received 25 mg/m cycle 1and then20 mg/m in subsequent cycles. Last cycle of chemotherapy given on 03/13/2017 for a total of 11 cycles.   Weight changes, if any: None  Bowel/Bladder complaints, if any:  No  Nausea/Vomiting, if any: Some nausea.  Pain issues, if any:  Pain around abdomen that occ radiates to his lower back.  BP (!) 157/94 (BP Location: Left Arm, Patient Position: Sitting)   Pulse 94   Temp 98 F (36.7 C) (Oral)   Resp 20   Ht 6' (1.829 m)   Wt 181 lb (82.1 kg)   SpO2 100%   BMI 24.55 kg/m    Wt Readings from Last 3 Encounters:  11/15/17 181 lb (82.1 kg)  10/31/17 172 lb 11.2 oz (78.3 kg)  09/17/17 174 lb 12.8 oz (79.3 kg)   SAFETY ISSUES:  Prior radiation?: Yes  Pacemaker/ICD? : No  Possible current pregnancy?:N/A  Is the patient on methotrexate? : No  Current Complaints / other details:

## 2017-11-15 ENCOUNTER — Encounter: Payer: Self-pay | Admitting: Urology

## 2017-11-15 ENCOUNTER — Ambulatory Visit
Admission: RE | Admit: 2017-11-15 | Discharge: 2017-11-15 | Disposition: A | Discharge status: Home / Self Care | Payer: Medicare Other | Source: Ambulatory Visit | Attending: Urology | Admitting: Urology

## 2017-11-15 ENCOUNTER — Other Ambulatory Visit: Payer: Self-pay

## 2017-11-15 ENCOUNTER — Ambulatory Visit
Admission: RE | Admit: 2017-11-15 | Discharge: 2017-11-15 | Disposition: A | Discharge status: Home / Self Care | Payer: Medicare Other | Source: Ambulatory Visit | Attending: Radiation Oncology | Admitting: Radiation Oncology

## 2017-11-15 VITALS — BP 157/94 | HR 94 | Temp 98.0°F | Resp 20 | Ht 72.0 in | Wt 181.0 lb

## 2017-11-15 DIAGNOSIS — C61 Malignant neoplasm of prostate: Secondary | ICD-10-CM | POA: Diagnosis not present

## 2017-11-15 DIAGNOSIS — C7951 Secondary malignant neoplasm of bone: Secondary | ICD-10-CM | POA: Insufficient documentation

## 2017-11-15 DIAGNOSIS — Z192 Hormone resistant malignancy status: Secondary | ICD-10-CM | POA: Diagnosis not present

## 2017-11-15 DIAGNOSIS — Z87891 Personal history of nicotine dependence: Secondary | ICD-10-CM | POA: Insufficient documentation

## 2017-11-15 DIAGNOSIS — M545 Low back pain: Secondary | ICD-10-CM | POA: Diagnosis not present

## 2017-11-15 DIAGNOSIS — Z9289 Personal history of other medical treatment: Secondary | ICD-10-CM | POA: Diagnosis not present

## 2017-11-15 DIAGNOSIS — Z7982 Long term (current) use of aspirin: Secondary | ICD-10-CM | POA: Diagnosis not present

## 2017-11-15 DIAGNOSIS — R590 Localized enlarged lymph nodes: Secondary | ICD-10-CM | POA: Diagnosis not present

## 2017-11-15 DIAGNOSIS — Z5111 Encounter for antineoplastic chemotherapy: Secondary | ICD-10-CM | POA: Diagnosis not present

## 2017-11-15 DIAGNOSIS — R1900 Intra-abdominal and pelvic swelling, mass and lump, unspecified site: Secondary | ICD-10-CM | POA: Diagnosis not present

## 2017-11-15 DIAGNOSIS — Z79899 Other long term (current) drug therapy: Secondary | ICD-10-CM | POA: Diagnosis not present

## 2017-11-15 NOTE — Progress Notes (Signed)
Radiation Oncology         [phone]) [phone] ________________________________  Outpatient Re-Consultation Visit  Name: [name] MRN: [id number]  Date of Service: 11/15/2017 DOB: 1945/11/04  EG:BTDVVO, Theophilus Kinds, MD  Wyatt Portela, MD   REFERRING PHYSICIAN: Wyatt Portela, MD  DIAGNOSIS: 72 y.o. gentleman with castrate resistant metastatic prostate cancer    ICD-10-CM   1. Prostate cancer metastatic to multiple sites [facility]) C61     HISTORY OF PRESENT ILLNESS: ERNAN [name] is a 72 y.o. male seen at the request of Dr. [name] for castration resistant metastatic prostate cancer. In summary, his prostate cancer was diagnosed in October 2015 with a PSA of 116 and Gleason score 4+5. He received androgen deprivation therapy in addition to 6 cycles of systemic chemotherapy with Taxotere given from 05/05/2014 to 08/20/2014. He developed castration resistant disease in 2017 and was started on Xtandi on 07/25/2015. This was discontinued in May 2018 due to progression of disease. He then received 11 cycles of Jevtana chemotherapy from 08/08/2016 to 03/13/2017 with reasonable tolerance and overall improvement in his quality of life. His most recent PSA on 05/15/2017 was 18.8, up from 14.9 that was taken following Jevtana treatment. He has continued on androgen deprivation with his last injection of Lupron on 02/19/2017, and was receiving monthly Xgeva injections that were started on 02/02/2016. 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 He denies any pathological fractures or exacerbation of his pain. His CT and bone scans from January 2018 indicated bone disease only and therefore, the patient elected to proceed with Xofigo infusion therapy beginning on 06/17/17.  He has continued to tolerate his monthly Xofigo infusions well and is scheduled for his 6th and final infusion on 11/20/17.  The patient presented to the symptom management clinic on  10/31/17 with a 2 week history of gross hematuria and flank pain. A CT A/P was performed for further evaluation and unfortunately demonstrated a large pelvic mass arising from the prostate gland and invading the bladder, distal right ureter and obstructing the left UVJ. There has been significant interval increase in size from 04/10/2016, now measuring 8.6 by 6.7 by 4.2 cm. This involves the posterior wall of the urinary bladder, both lateral walls of the urinary bladder and the distal 4 cm of the right ureter. On the previous exam, the tumor measured approximately 1.5 cm. Additionally, there are new small left common iliac and left pelvic sidewall lymph nodes, suspicious for metastatic adenopathy and progression of sclerotic bone metastasis involving the lumbar spine and pelvis with new, age-indeterminate, T11 and L1 compression deformities as compared to previous study from 03/2016 and a new 75mm left lower lobe lung nodule of undetermined significance.  He has been referred back to radiation oncology today to discuss the potential role of palliative radiotherapy in the management of the enlarging pelvic mass.  He was also referred for urgent evaluation by Urology for possible cystoscopy and/or potential evaluation for nephrostomy tube placement due to significant hydronephrosis seen on recent CT imaging and elevated kidney function on recent labs.  I see the referral was placed on 11/04/17 but do not see where this appointment has been scheduled as of yet.  PREVIOUS RADIATION THERAPY: No  PAST MEDICAL HISTORY:  Past Medical History:  Diagnosis Date  . Diverticulosis of colon   . Hx of colonic polyps   . Prostate cancer ([place])  PAST SURGICAL HISTORY: Past Surgical History:  Procedure Laterality Date  . CARDIOVASCULAR STRESS TEST  1996   Negative  . IR FLUORO GUIDE PORT INSERTION RIGHT  01/09/2017  . IR US GUIDE VASC ACCESS RIGHT  01/09/2017  . PORTACATH PLACEMENT      FAMILY HISTORY:    Family History  Problem Relation Age of Onset  . Heart attack Father   . Heart disease Father   . Heart attack Brother 44  . Heart disease Brother   . Heart attack Brother   . Heart disease Brother   . Cancer Sister        breast  . Hypertension Mother   . Diabetes Sister   . Cancer Maternal Aunt   . Cancer Maternal Aunt   . Asthma Son     SOCIAL HISTORY:  Social History   Socioeconomic History  . Marital status: Married    Spouse name: Not on file  . Number of children: 3  . Years of education: Not on file  . Highest education level: Not on file  Occupational History  . Occupation: cancer registry reporting  Social Needs  . Financial resource strain: Not on file  . Food insecurity:    Worry: Not on file    Inability: Not on file  . Transportation needs:    Medical: Not on file    Non-medical: Not on file  Tobacco Use  . Smoking status: Former Smoker    Packs/day: 1.00    Years: 10.00    Pack years: 10.00    Types: Cigarettes    Last attempt to quit: 03/26/1990    Years since quitting: 27.6  . Smokeless tobacco: Never Used  Substance and Sexual Activity  . Alcohol use: No    Frequency: Never  . Drug use: No  . Sexual activity: Never  Lifestyle  . Physical activity:    Days per week: Not on file    Minutes per session: Not on file  . Stress: Not on file  Relationships  . Social connections:    Talks on phone: Not on file    Gets together: Not on file    Attends religious service: Not on file    Active member of club or organization: Not on file    Attends meetings of clubs or organizations: Not on file    Relationship status: Not on file  . Intimate partner violence:    Fear of current or ex partner: Not on file    Emotionally abused: Not on file    Physically abused: Not on file    Forced sexual activity: Not on file  Other Topics Concern  . Not on file  Social History Narrative   Has living will   Wife is health care POA.   Would accept  resuscitation    No tube feeds if cognitively unaware.    ALLERGIES: Patient has no allergy information on record.  MEDICATIONS:  Current Outpatient Medications  Medication Sig Dispense Refill  . aspirin 81 MG tablet Take 81 mg by mouth every morning.     . diphenhydramine-acetaminophen (TYLENOL PM) 25-500 MG TABS tablet Take 2 tablets by mouth at bedtime as needed.    Marland Kitchen HYDROcodone-acetaminophen (NORCO) 5-325 MG tablet 1/2 to 1 tablet Q 6 hours prn pain 30 tablet 0  . lidocaine-prilocaine (EMLA) cream Apply 1 application topically as needed. Apply to portacath on the day of chemotherapy. 30 g 3  . loratadine (CLARITIN) 10 MG tablet Take 10  mg by mouth daily as needed for allergies.    . megestrol (MEGACE) 40 MG/ML suspension TAKE 10 MLS (400 MG TOTAL) BY MOUTH 2 (TWO) TIMES DAILY. 240 mL 0  . ondansetron (ZOFRAN) 4 MG tablet Take 1 tablet (4 mg total) by mouth every 8 (eight) hours as needed for nausea or vomiting. 30 tablet 2  . promethazine (PHENERGAN) 25 MG tablet Take 1 tablet (25 mg total) by mouth every 6 (six) hours as needed for nausea or vomiting. 60 tablet 3  . pseudoephedrine (SUDAFED) 30 MG tablet Take 30 mg by mouth as directed.    Marland Kitchen XIIDRA 5 % SOLN INSTILL 1 DROP INTO BOTH EYES TWICE A DAY  6  . dexamethasone (DECADRON) 4 MG tablet Take one tablet twice a day. Start 2 days after chemotherapy for 5 days. (Patient not taking: Reported on 05/27/2017) 30 tablet 3  . prochlorperazine (COMPAZINE) 10 MG tablet Take 1 tablet (10 mg total) by mouth every 6 (six) hours as needed for nausea or vomiting. (Patient not taking: Reported on 05/27/2017) 30 tablet 1   No current facility-administered medications for this encounter.    Facility-Administered Medications Ordered in Other Encounters  Medication Dose Route Frequency Provider Last Rate Last Dose  . denosumab (XGEVA) injection 120 mg  120 mg Subcutaneous Once Shadad, [name], MD        REVIEW OF SYSTEMS:  On review of systems, the  patient reports that he is doing well overall. He has not seen any further gross hematuria within the past week.  He has continued with right flank pain but controlled with pain medications.  He denies any chest pain, shortness of breath, cough, fevers, chills, night sweats, or unintended weight changes. He reports poor stamina and fluctuating appetite. He denies any bowel or bladder disturbances, and denies abdominal pain, nausea or vomiting. He reports intermittent joint and low back pain but denies any focal/specific pain presently. He denies any abnormal blood loss. He denies any numbness or weakness in his extremities. He has noted mild swelling in the right lower extremity, worse mid-late day and improved with elevation, present for the past several weeks and not progressively worsening.  He denies calf pain, erythema or tenderness.  A complete review of systems is obtained and is otherwise negative.   PHYSICAL EXAM:  Wt Readings from Last 3 Encounters:  11/15/17 181 lb (82.1 kg)  10/31/17 172 lb 11.2 oz (78.3 kg)  09/17/17 174 lb 12.8 oz (79.3 kg)   Temp Readings from Last 3 Encounters:  11/15/17 98 F (36.7 C) (Oral)  11/01/17 98 F (36.7 C) (Oral)  10/31/17 97.8 F (36.6 C) (Oral)   BP Readings from Last 3 Encounters:  11/15/17 (!) 157/94  11/01/17 (!) 148/73  10/31/17 (!) 165/94   Pulse Readings from Last 3 Encounters:  11/15/17 94  11/01/17 80  10/31/17 83   Pain Assessment Pain Score: 7  Pain Frequency: Intermittent Pain Loc: Abdomen/10  In general this is a well appearing caucasian male in no acute distress. He's alert and oriented x4 and appropriate throughout the examination. Cardiopulmonary assessment is negative for acute distress and he exhibits normal effort.   KPS = 90  100 - Normal; no complaints; no evidence of disease. 90   - Able to carry on normal activity; minor signs or symptoms of disease. 80   - Normal activity with effort; some signs or symptoms of  disease. 56   - Cares for self; unable to carry on  normal activity or to do active work. 60   - Requires occasional assistance, but is able to care for most of his personal needs. 50   - Requires considerable assistance and frequent medical care. 60   - Disabled; requires special care and assistance. 51   - Severely disabled; hospital admission is indicated although death not imminent. 37   - Very sick; hospital admission necessary; active supportive treatment necessary. 10   - Moribund; fatal processes progressing rapidly. 0     - Dead  Karnofsky DA, Abelmann Prichard, Craver LS and Burchenal [facility] [phone]) The use of the nitrogen mustards in the palliative treatment of carcinoma: with particular reference to bronchogenic carcinoma Cancer 1 634-56  LABORATORY DATA:  Lab Results  Component Value Date   WBC 5.1 11/13/2017   HGB 9.0 (L) 11/13/2017   HCT 26.9 (L) 11/13/2017   MCV 88.3 11/13/2017   PLT 207 11/13/2017   Lab Results  Component Value Date   NA 144 10/31/2017   K 4.5 10/31/2017   CL 116 (H) 10/31/2017   CO2 17 (L) 10/31/2017   Lab Results  Component Value Date   ALT 7 10/31/2017   AST 8 (L) 10/31/2017   ALKPHOS 71 10/31/2017   BILITOT 0.3 10/31/2017     RADIOGRAPHY: Ct Abdomen Pelvis W Contrast  Result Date: 11/01/2017 CLINICAL DATA:  Intermittent hematuria.  Prostate cancer. EXAM: CT ABDOMEN AND PELVIS WITH CONTRAST TECHNIQUE: Multidetector CT imaging of the abdomen and pelvis was performed using the standard protocol following bolus administration of intravenous contrast. CONTRAST:  57mL ISOVUE-300 IOPAMIDOL (ISOVUE-300) INJECTION 61% COMPARISON:  04/10/2016 FINDINGS: Lower chest: Scarring identified within the lingula and left lower lobe. There is a small pulmonary nodule in the left lower lobe which measures 4 mm, image 24/6. This is new compared with 04/10/2016. Hepatobiliary: Multiple low attenuation foci within the liver identified compatible with cysts. Similar to previous  exam. Stones identified within the gallbladder. No gallbladder wall thickening or biliary dilatation. No choledocholithiasis. Pancreas: Unremarkable. No pancreatic ductal dilatation or surrounding inflammatory changes. Spleen: Normal in size without focal abnormality. Adrenals/Urinary Tract: Normal adrenal glands. There is bilateral hydronephrosis and hydroureter, right greater than left. Since the previous exam there has been significant cortical volume loss from the right kidney. Areas of diminished enhancement involving the upper pole of the left kidney is identified which could be seen with focal pyelonephritis or infarct. Large pelvic mass arising from the prostate gland and invading the bladder, distal right ureter and obstructing the left UVJ is identified. This demonstrates significant interval increase in size from 04/10/2016. On today's study the mass measures 8.6 by 6.7 by 4.2 cm, image 41/11 and image 56/12. This involves the posterior wall of the urinary bladder, both lateral walls of the urinary bladder and the distal 4 cm of the right ureter. On the previous exam the tumor measured approximately 1.5 cm. Stomach/Bowel: The stomach is normal. No pathologic dilatation of the small or large bowel loops. Normal appearance of the colon. Vascular/Lymphatic: Aortic atherosclerosis. No aneurysm. No adenopathy identified within the abdomen. New left common iliac node measures 8 mm, image 51/2. New small left pelvic sidewall node measures 8 mm, image 71/2. Reproductive: Large mass arising from the prostate gland as described above. Other: No ascites or focal fluid collections. Musculoskeletal: Scoliosis and degenerative disc disease identified. Scattered sclerotic bone metastases are identified. This includes diffuse sclerosis involving the T12 vertebra which is new from previous exam. Similar appearance of sclerotic metastasis within  the S1 vertebra. Asymmetric sclerotic lesion involving the posterior aspect of  the left iliac bone has increased in size from previous exam. New compression fractures are identified involving T11, L1. These are age indeterminate. IMPRESSION: 1. Interval progression of disease. 2. Marked increase in size of mass arising from the prostate gland which involves a significant portion of the urinary bladder and distal right ureter. There is also obstruction of the left UVJ. 3. New small left common iliac and left pelvic sidewall lymph nodes are suspicious for metastatic adenopathy. 4. Progression of sclerotic bone metastasis involving the lumbar spine and pelvis. 5. 4 mm left lower lobe lung nodule is new from 04/10/2016. 6. Bilateral hydronephrosis as above. Right hydronephrosis, is likely chronic as there has been progressive cortical volume loss from 04/10/2016. There is a focal area of decreased cortical enhancement of the left kidney which may represent a vascular insult or focal pyelonephritis. 7. New age-indeterminate T11 and L1 compression deformities. 8.  Aortic Atherosclerosis (ICD10-I70.0). Electronically Signed   By: Kerby Moors M.D.   On: 11/01/2017 16:50   Nm Xofigo Injection  Result Date: 10/23/2017  Trudi Ida was injected intravenously in Nuclear Medicine under the supervision of the attending radiologist      IMPRESSION/PLAN: 38. 72 y.o. gentleman with an enlarging pelvic mass secondary to progression of his castrate resistant prostate cancer.  Today, we talked to the patient and his wife about the findings and workup thus far and personally reviewed his recent imaging studies. We discussed the natural history of castration resistant metastatic adenocarcinoma of the prostate and general treatment, highlighting the role of palliative radiotherapy in the management. We discussed the available radiation techniques, and focused on the details of logistics and delivery. The recommendation is for a 10 day course of daily radiation treatments to the pelvic mass delivered over a two  week period. We reviewed the anticipated acute and late sequelae associated with radiation in this setting. The patient was encouraged to ask questions that were answered to his satisfaction.  At the conclusion of our conversation, the patient elects to proceed with palliative radiotherapy as recommended.  He has freely signed written consent today in the office and a copy of this document has been placed in his chart.  He is scheduled for CT simulation/treatment planning on Wednesday, November 20, 2017 at 10 AM in anticipation of beginning treatment the first week of September.  He will also proceed with his sixth and final Xofigo injection as planned on 11/20/2017.  In the meantime, I have put a request in to Cira Rue, RN prostate navigator to assist with coordinating an office visit with Dr. [name] at [facility] urology for further evaluation and to assess any potential need for nephrostomy tube placement.  The patient appears to have a good understanding of his disease and our recommendations and is in agreement with this plan.  He knows to call with any questions or concerns in the interim.    Nicholos Johns, PA-C    Tyler Pita, MD  [facility] Direct Dial: [phone]  Fax: [phone] [email or web address]  Skype  LinkedIn

## 2017-11-19 ENCOUNTER — Inpatient Hospital Stay: Payer: Medicare Other

## 2017-11-19 ENCOUNTER — Telehealth: Payer: Self-pay

## 2017-11-19 ENCOUNTER — Telehealth: Payer: Self-pay | Admitting: *Deleted

## 2017-11-19 ENCOUNTER — Other Ambulatory Visit: Payer: Medicare Other

## 2017-11-19 DIAGNOSIS — C61 Malignant neoplasm of prostate: Secondary | ICD-10-CM

## 2017-11-19 DIAGNOSIS — Z95828 Presence of other vascular implants and grafts: Secondary | ICD-10-CM

## 2017-11-19 DIAGNOSIS — C7989 Secondary malignant neoplasm of other specified sites: Secondary | ICD-10-CM | POA: Diagnosis not present

## 2017-11-19 DIAGNOSIS — R310 Gross hematuria: Secondary | ICD-10-CM | POA: Diagnosis not present

## 2017-11-19 DIAGNOSIS — R109 Unspecified abdominal pain: Secondary | ICD-10-CM | POA: Diagnosis not present

## 2017-11-19 DIAGNOSIS — N1330 Unspecified hydronephrosis: Secondary | ICD-10-CM | POA: Diagnosis not present

## 2017-11-19 DIAGNOSIS — C7951 Secondary malignant neoplasm of bone: Secondary | ICD-10-CM | POA: Diagnosis not present

## 2017-11-19 LAB — CMP (CANCER CENTER ONLY)
ALT: 6 U/L (ref 0–44)
ANION GAP: 11 (ref 5–15)
AST: 11 U/L — ABNORMAL LOW (ref 15–41)
Albumin: 3.8 g/dL (ref 3.5–5.0)
Alkaline Phosphatase: 75 U/L (ref 38–126)
BILIRUBIN TOTAL: 0.3 mg/dL (ref 0.3–1.2)
BUN: 50 mg/dL — ABNORMAL HIGH (ref 8–23)
CALCIUM: 9.9 mg/dL (ref 8.9–10.3)
CO2: 18 mmol/L — ABNORMAL LOW (ref 22–32)
CREATININE: 4.53 mg/dL — AB (ref 0.61–1.24)
Chloride: 115 mmol/L — ABNORMAL HIGH (ref 98–111)
GFR, EST AFRICAN AMERICAN: 14 mL/min — AB (ref >60)
GFR, EST NON AFRICAN AMERICAN: 12 mL/min — AB (ref >60)
Glucose, Bld: 88 mg/dL (ref 70–99)
Potassium: 4.7 mmol/L (ref 3.5–5.1)
Sodium: 144 mmol/L (ref 135–145)
TOTAL PROTEIN: 7 g/dL (ref 6.5–8.1)

## 2017-11-19 MED ORDER — SODIUM CHLORIDE 0.9% FLUSH
10.0000 mL | Freq: Once | INTRAVENOUS | Status: AC
  Administered 2017-11-19: 10 mL
  Filled 2017-11-19: qty 10

## 2017-11-19 MED ORDER — HEPARIN SOD (PORK) LOCK FLUSH 100 UNIT/ML IV SOLN
250.0000 [IU] | Freq: Once | INTRAVENOUS | Status: AC
  Administered 2017-11-19: 250 [IU]
  Filled 2017-11-19: qty 5

## 2017-11-19 NOTE — Telephone Encounter (Signed)
Called patient to remind of Xofigo Inj. for 11-20-17- arrival time - 11:15 am @ [facility], spoke with patient and he is aware of this inj.

## 2017-11-19 NOTE — Telephone Encounter (Signed)
Patient appointment was canceled and he was not informed. Dixie will speak with patient after [name] view the patients charts. Per 8/27 walk ins

## 2017-11-20 ENCOUNTER — Ambulatory Visit
Admission: RE | Admit: 2017-11-20 | Discharge: 2017-11-20 | Disposition: A | Discharge status: Home / Self Care | Payer: Medicare Other | Source: Ambulatory Visit | Attending: Radiation Oncology | Admitting: Radiation Oncology

## 2017-11-20 ENCOUNTER — Ambulatory Visit (HOSPITAL_COMMUNITY)
Admission: RE | Admit: 2017-11-20 | Discharge: 2017-11-20 | Disposition: A | Discharge status: Home / Self Care | Payer: Medicare Other | Source: Ambulatory Visit | Attending: Oncology | Admitting: Oncology

## 2017-11-20 ENCOUNTER — Ambulatory Visit: Payer: Medicare Other

## 2017-11-20 ENCOUNTER — Encounter (HOSPITAL_COMMUNITY)
Admission: RE | Admit: 2017-11-20 | Discharge: 2017-11-20 | Disposition: A | Discharge status: Home / Self Care | Payer: Medicare Other | Source: Ambulatory Visit | Attending: Radiation Oncology | Admitting: Radiation Oncology

## 2017-11-20 ENCOUNTER — Inpatient Hospital Stay (HOSPITAL_BASED_OUTPATIENT_CLINIC_OR_DEPARTMENT_OTHER): Payer: Medicare Other | Admitting: Oncology

## 2017-11-20 ENCOUNTER — Telehealth: Payer: Self-pay | Admitting: Oncology

## 2017-11-20 ENCOUNTER — Other Ambulatory Visit: Payer: Self-pay | Admitting: Oncology

## 2017-11-20 VITALS — BP 155/85 | HR 77 | Temp 97.7°F | Resp 17 | Ht 72.0 in | Wt 185.9 lb

## 2017-11-20 DIAGNOSIS — C7951 Secondary malignant neoplasm of bone: Secondary | ICD-10-CM | POA: Diagnosis not present

## 2017-11-20 DIAGNOSIS — R609 Edema, unspecified: Secondary | ICD-10-CM | POA: Diagnosis not present

## 2017-11-20 DIAGNOSIS — R069 Unspecified abnormalities of breathing: Secondary | ICD-10-CM | POA: Insufficient documentation

## 2017-11-20 DIAGNOSIS — Z79899 Other long term (current) drug therapy: Secondary | ICD-10-CM | POA: Diagnosis not present

## 2017-11-20 DIAGNOSIS — C61 Malignant neoplasm of prostate: Secondary | ICD-10-CM

## 2017-11-20 DIAGNOSIS — R109 Unspecified abdominal pain: Secondary | ICD-10-CM | POA: Diagnosis not present

## 2017-11-20 DIAGNOSIS — N1330 Unspecified hydronephrosis: Secondary | ICD-10-CM

## 2017-11-20 DIAGNOSIS — R310 Gross hematuria: Secondary | ICD-10-CM | POA: Diagnosis not present

## 2017-11-20 DIAGNOSIS — C7989 Secondary malignant neoplasm of other specified sites: Secondary | ICD-10-CM

## 2017-11-20 DIAGNOSIS — R600 Localized edema: Secondary | ICD-10-CM | POA: Diagnosis not present

## 2017-11-20 LAB — PROSTATE-SPECIFIC AG, SERUM (LABCORP): Prostate Specific Ag, Serum: 217 ng/mL — ABNORMAL HIGH (ref 0.0–4.0)

## 2017-11-20 MED ORDER — RADIUM RA 223 DICHLORIDE 30 MCCI/ML IV SOLN
120.9000 | Freq: Once | INTRAVENOUS | Status: AC
  Administered 2017-11-20: 120.9 via INTRAVENOUS

## 2017-11-20 NOTE — Telephone Encounter (Signed)
Appts scheduled AVS/Calendar printed/ LM for [place] to call to scheduled [place] appt for today per 8/28 los

## 2017-11-20 NOTE — Progress Notes (Signed)
  Radiation Oncology         (336) [phone] ________________________________  Name: [name] MRN: [id number]  Date: 11/20/2017  DOB: [birth year]  Radium-223 Infusion Note  Diagnosis:  Castration resistant prostate cancer with painful bone involvement  Current Infusion:    6  Planned Infusions:  6  Narrative: [name] presented to nuclear medicine for treatment. His most recent blood counts were reviewed.  He remains a good candidate to proceed with Ra-223.  The patient was situated in an infusion suite with a contact barrier placed under his arm. Intravenous access was established, using sterile technique, and a normal saline infusion from a syringe was started.  Micro-dosimetry:  The prescribed radiation activity was assayed and confirmed to be within specified tolerance.  Special Treatment Procedure - Infusion:  The nuclear medicine technologist and I personally verified the dose activity to be delivered as specified in the written directive, and verified the patient identification via 2 separate methods.  The syringe containing the dose was attached to a 3 way stopcock, and then the valve was opened to the patient, and the dose delivered over a minute. No complications were noted.  The total administered dose was 123.6 microcuries in a volume of 6.57 cc.   A saline flush of the line and the syringe that contained the isotope was then performed.  The residual radioactivity in the syringe was 2.7 microcuries, so the actual infused isotope activity was 120.9 microcuries.   Pressure was applied to the venipuncture site, and a compression bandage placed.   Radiation Safety personnel were present to perform the discharge survey, as detailed on their documentation.   After a short period of observation, the patient had his IV removed.  Impression:  The patient tolerated his infusion relatively well.  Plan:  The patient has completed infusion treatments. He will return in one month for  ongoing care.    ________________________________  Sheral Apley. Tammi Klippel, M.D.  This document serves as a record of services personally performed by Tyler Pita, MD. It was created on his behalf by Wilburn Mylar, a trained medical scribe. The creation of this record is based on the scribe's personal observations and the provider's statements to them. This document has been checked and approved by the attending provider.

## 2017-11-20 NOTE — Progress Notes (Signed)
  Radiation Oncology         (336) [phone] ________________________________  Name: [name] MRN: [id number]  Date: 11/20/2017  DOB: [birth year]  SIMULATION AND TREATMENT PLANNING NOTE    ICD-10-CM   1. Prostate cancer metastatic to multiple sites [facility]) C61     DIAGNOSIS:  72 y.o. gentleman with castrate resistant metastatic prostate cancer  NARRATIVE:  The patient was brought to the [facility].  Identity was confirmed.  All relevant records and images related to the planned course of therapy were reviewed.  The patient freely provided informed written consent to proceed with treatment after reviewing the details related to the planned course of therapy. The consent form was witnessed and verified by the simulation staff.  Then, the patient was set-up in a stable reproducible  supine position for radiation therapy.  CT images were obtained.  Surface markings were placed.  The CT images were loaded into the planning software.  Then the target and avoidance structures were contoured.  Treatment planning then occurred.  The radiation prescription was entered and confirmed.  Then, I designed and supervised the construction of a total of 5 medically necessary complex treatment devices, including body fix positioner and 4 collimators.  I have requested : 3D Simulation  I have requested a DVH of the following structures: bladder, rectum, and target.    PLAN:  The patient will receive 30 Gy in 10 fraction.  ________________________________  Sheral Apley Tammi Klippel, M.D.  This document serves as a record of services personally performed by Tyler Pita, MD. It was created on his behalf by Wilburn Mylar, a trained medical scribe. The creation of this record is based on the scribe's personal observations and the provider's statements to them. This document has been checked and approved by the attending provider.

## 2017-11-20 NOTE — Progress Notes (Signed)
Right lower extremity venous duplex completed. - Preliminary results - There is no evidence of DVT or Baker's cyst. There is evidence of mild interstitial fluid noted throughout the lower leg. [facility], [place] 11/20/2017 4:09 PM

## 2017-11-20 NOTE — Progress Notes (Signed)
Hematology and Oncology Follow Up Visit  [name] [id number] 22-Nov-1945 72 y.o. 11/20/2017 12:22 PM   Principle Diagnosis: 72 year old man with prostate cancer diagnosed in 2015.  His Gleason score was 4+5 = 9 and a PSA 116.  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  116.    Prior Therapy:  Androgen deprivation therapy in addition to systemic chemotherapy in the form of Taxotere 75 mg/m every 3 weeks with plans for total of 6 cycles. Cycle 1 given on 05/05/2014. He is status post 6 cycles of therapy concluded on 08/20/2014.  He developed castration resistant disease in 2017.  Xtandi 160 mg daily started on 07/25/2015. Therapy discontinued in May 2018 because of progression of disease.  Jevtana chemotherapy started on 08/08/2016. He received 25 mg/m cycle 1 and then 20 mg/m in subsequent cycles.  Last cycle of chemotherapy given on 03/13/2017 for a total of 11 cycles.  Current therapy:  Androgen deprivation in the form of Lupron received every 4 months at 30 mg.  Next injection is scheduled for October 2019.  Xgeva monthly injections started 02/02/2016.   Xofigo monthly injections started on June 17, 2017.  He is is status post 6 treatments concluded in August 2019.   Interim History:  [name] presents today for a follow-up.  Since her last visit, he finished a Xofigo infusions on 11/20/2017 without any delayed complications.  In the meantime, he is developing lower pelvic discomfort as well as flank pain in the interim.  His pain is manageable and does report frequency and urgency associated with urination.  He denies any dysuria or hematuria.  His appetite remained reasonable and has not reported any weight loss.  His performance status has not declined at this time.  He does report some fatigue and occasional dyspnea.  He did report lower extremity edema as well.  He does not report any headaches, blurry vision or syncope or seizures.   He denies any dizziness or excessive fatigue.  He does not report any fevers, chills, or night sweats.  He does not report any chest pain, palpitation, orthopnea or leg edema. He does not report any cough, hemoptysis or hematemesis. He does not report any hematochezia, melena.  He denies any change in his bowel habits.   He does not report any skin rashes or lesions.  He does not report any lymphadenopathy or petechiae.   He denies any bleeding or clotting tendencies.  He denies any muscle aches or arthralgias.  Remainder of his review of systems is negative.  Medications: I have reviewed the patient's current medications.   Current Outpatient Medications  Medication Sig Dispense Refill  . aspirin 81 MG tablet Take 81 mg by mouth every morning.     Marland Kitchen dexamethasone (DECADRON) 4 MG tablet Take one tablet twice a day. Start 2 days after chemotherapy for 5 days. (Patient not taking: Reported on 05/27/2017) 30 tablet 3  . diphenhydramine-acetaminophen (TYLENOL PM) 25-500 MG TABS tablet Take 2 tablets by mouth at bedtime as needed.    Marland Kitchen HYDROcodone-acetaminophen (NORCO) 5-325 MG tablet 1/2 to 1 tablet Q 6 hours prn pain 30 tablet 0  . lidocaine-prilocaine (EMLA) cream Apply 1 application topically as needed. Apply to portacath on the day of chemotherapy. 30 g 3  . loratadine (CLARITIN) 10 MG tablet Take 10 mg by mouth daily as needed for allergies.    . megestrol (MEGACE) 40 MG/ML suspension TAKE 10 MLS (400 MG TOTAL) BY MOUTH  2 (TWO) TIMES DAILY. 240 mL 0  . ondansetron (ZOFRAN) 4 MG tablet Take 1 tablet (4 mg total) by mouth every 8 (eight) hours as needed for nausea or vomiting. 30 tablet 2  . prochlorperazine (COMPAZINE) 10 MG tablet Take 1 tablet (10 mg total) by mouth every 6 (six) hours as needed for nausea or vomiting. (Patient not taking: Reported on 05/27/2017) 30 tablet 1  . promethazine (PHENERGAN) 25 MG tablet Take 1 tablet (25 mg total) by mouth every 6 (six) hours as needed for nausea or vomiting.  60 tablet 3  . pseudoephedrine (SUDAFED) 30 MG tablet Take 30 mg by mouth as directed.    Marland Kitchen XIIDRA 5 % SOLN INSTILL 1 DROP INTO BOTH EYES TWICE A DAY  6   No current facility-administered medications for this visit.    Facility-Administered Medications Ordered in Other Visits  Medication Dose Route Frequency Provider Last Rate Last Dose  . denosumab (XGEVA) injection 120 mg  120 mg Subcutaneous Once Alwilda Gilland, [name], MD         Allergies: Not on File  Past Medical History, Surgical history, Social history: Reviewed and remain unchanged.   Physical Exam:  Blood pressure (!) 155/85, pulse 77, temperature 97.7 F (36.5 C), temperature source Oral, resp. rate 17, height 6' (1.829 m), weight 185 lb 14.4 oz (84.3 kg), SpO2 100 %.  ECOG: 1   General appearance: Comfortable appearing without any discomfort.  Appears slightly pale. Head: Normocephalic without any trauma Oropharynx: Mucous membranes are moist and pink without any thrush or ulcers. Eyes: Pupils are equal and round reactive to light. Lymph nodes: No cervical, supraclavicular, inguinal or axillary lymphadenopathy.   Heart:regular rate and rhythm.  S1 and S2.  1+ right leg edema noted. Lung: Clear without any rhonchi or wheezes.  No dullness to percussion. Abdomin: Soft, nontender, nondistended with good bowel sounds.  No hepatosplenomegaly. Musculoskeletal: No joint deformity or effusion.  Full range of motion noted. Neurological: No deficits noted on motor, sensory and deep tendon reflex exam. Skin: No petechial rash or dryness.  Appeared moist.  Psychiatric: Mood and affect appeared appropriate.      Lab Results:  CBC    Component Value Date/Time   WBC 5.1 11/13/2017 1235   WBC 4.8 06/23/2017 1220   RBC 3.05 (L) 11/13/2017 1235   HGB 9.0 (L) 11/13/2017 1235   HGB 12.2 (L) 03/13/2017 0904   HCT 26.9 (L) 11/13/2017 1235   HCT 37.8 (L) 03/13/2017 0904   PLT 207 11/13/2017 1235   PLT 207 03/13/2017 0904   MCV  88.3 11/13/2017 1235   MCV 92.0 03/13/2017 0904   MCH 29.5 11/13/2017 1235   MCHC 33.4 11/13/2017 1235   RDW 14.4 11/13/2017 1235   RDW 15.4 (H) 03/13/2017 0904   LYMPHSABS 0.9 11/13/2017 1235   LYMPHSABS 1.2 03/13/2017 0904   MONOABS 0.5 11/13/2017 1235   MONOABS 0.4 03/13/2017 0904   EOSABS 0.1 11/13/2017 1235   EOSABS 0.0 03/13/2017 0904   BASOSABS 0.0 11/13/2017 1235   BASOSABS 0.0 03/13/2017 0904   '    Chemistry      Component Value Date/Time   NA 144 11/19/2017 1228   NA 142 03/13/2017 0904   K 4.7 11/19/2017 1228   K 4.4 03/13/2017 0904   CL 115 (H) 11/19/2017 1228   CO2 18 (L) 11/19/2017 1228   CO2 22 03/13/2017 0904   BUN 50 (H) 11/19/2017 1228   BUN 14.9 03/13/2017 0904   CREATININE  4.53 (HH) 11/19/2017 1228   CREATININE 1.0 03/13/2017 0904      Component Value Date/Time   CALCIUM 9.9 11/19/2017 1228   CALCIUM 9.1 03/13/2017 0904   ALKPHOS 75 11/19/2017 1228   ALKPHOS 67 03/13/2017 0904   AST 11 (L) 11/19/2017 1228   AST 9 03/13/2017 0904   ALT 6 11/19/2017 1228   ALT 7 03/13/2017 0904   BILITOT 0.3 11/19/2017 1228   BILITOT 0.32 03/13/2017 0904        Results for Mccleary, [name] (MRN [id number]) as of 11/20/2017 12:51  Ref. Range 09/16/2017 12:16 11/19/2017 12:28  Prostate Specific Ag, Serum Latest Ref Range: 0.0 - 4.0 ng/mL 88.5 (H) 217.0 (H)     EXAM: CT ABDOMEN AND PELVIS WITH CONTRAST  TECHNIQUE: Multidetector CT imaging of the abdomen and pelvis was performed using the standard protocol following bolus administration of intravenous contrast.  CONTRAST:  10mL ISOVUE-300 IOPAMIDOL (ISOVUE-300) INJECTION 61%  COMPARISON:  04/10/2016  FINDINGS: Lower chest: Scarring identified within the lingula and left lower lobe. There is a small pulmonary nodule in the left lower lobe which measures 4 mm, image 24/6. This is new compared with 04/10/2016.  Hepatobiliary: Multiple low attenuation foci within the liver identified compatible with  cysts. Similar to previous exam. Stones identified within the gallbladder. No gallbladder wall thickening or biliary dilatation. No choledocholithiasis.  Pancreas: Unremarkable. No pancreatic ductal dilatation or surrounding inflammatory changes.  Spleen: Normal in size without focal abnormality.  Adrenals/Urinary Tract: Normal adrenal glands.  There is bilateral hydronephrosis and hydroureter, right greater than left. Since the previous exam there has been significant cortical volume loss from the right kidney. Areas of diminished enhancement involving the upper pole of the left kidney is identified which could be seen with focal pyelonephritis or infarct.  Large pelvic mass arising from the prostate gland and invading the bladder, distal right ureter and obstructing the left UVJ is identified. This demonstrates significant interval increase in size from 04/10/2016. On today's study the mass measures 8.6 by 6.7 by 4.2 cm, image 41/11 and image 56/12. This involves the posterior wall of the urinary bladder, both lateral walls of the urinary bladder and the distal 4 cm of the right ureter. On the previous exam the tumor measured approximately 1.5 cm.  Stomach/Bowel: The stomach is normal. No pathologic dilatation of the small or large bowel loops. Normal appearance of the colon.  Vascular/Lymphatic: Aortic atherosclerosis. No aneurysm. No adenopathy identified within the abdomen. New left common iliac node measures 8 mm, image 51/2. New small left pelvic sidewall node measures 8 mm, image 71/2.  Reproductive: Large mass arising from the prostate gland as described above.  Other: No ascites or focal fluid collections.  Musculoskeletal: Scoliosis and degenerative disc disease identified. Scattered sclerotic bone metastases are identified. This includes diffuse sclerosis involving the T12 vertebra which is new from previous exam. Similar appearance of sclerotic  metastasis within the S1 vertebra. Asymmetric sclerotic lesion involving the posterior aspect of the left iliac bone has increased in size from previous exam. New compression fractures are identified involving T11, L1. These are age indeterminate.  IMPRESSION: 1. Interval progression of disease. 2. Marked increase in size of mass arising from the prostate gland which involves a significant portion of the urinary bladder and distal right ureter. There is also obstruction of the left UVJ. 3. New small left common iliac and left pelvic sidewall lymph nodes are suspicious for metastatic adenopathy. 4. Progression of sclerotic bone metastasis involving the  lumbar spine and pelvis. 5. 4 mm left lower lobe lung nodule is new from 04/10/2016. 6. Bilateral hydronephrosis as above. Right hydronephrosis, is likely chronic as there has been progressive cortical volume loss from 04/10/2016. There is a focal area of decreased cortical enhancement of the left kidney which may represent a vascular insult or focal pyelonephritis. 7. New age-indeterminate T11 and L1 compression deformities. 8.  Aortic Atherosclerosis (ICD10-I70.0).      Impression and Plan:  72 year old man with  1.  Advanced prostate cancer diagnosed in 2015 and developed castration-resistant in 2017 after initial diagnosis with disease to the bone as well as pelvic mass.  He has completed Xofigo treatment without any major complications.  CT scan obtained on 11/01/2017 was personally reviewed today and discussed with him.  He clearly has locally advanced disease and currently planning to receive radiation therapy to the pelvis in the near future.  Additional systemic therapy may be needed after he completes local treatment.  2. Anorexia: He is eating reasonably well and he has gained some weight.  3. Androgen depravation: Lupron will be continued indefinitely.  Next injection will be given in October 2019.  4.  Bilateral  hydronephrosis: The natural course of this finding was discussed today with the patient.  His kidney function is declining rather rapidly and he is developing more symptoms at this time.  I will send an urgent referral to interventional radiology as well as urology for evaluation in this matter.  We have discussed the rationale for using bilateral nephrostomy tube given his deteriorating kidney function.  I also given clear instructions to go to the emergency department if he start developing signs of sepsis including fevers, chills worsening flank pain or further complaints.  5. IV access: Port-A-Cath will be maintained for the time being with flushes every 2 months.  6.  Right lower extremity edema: This could be related to local lymphatic compression from his tumor.  Deep vein thrombosis will be evaluated as well and will obtain ultrasound Dopplers.  Full dose anticoagulation may be needed if he has deep vein thrombosis.  7. Follow-up: Will be in 4 weeks to follow his progress.   30  minutes was spent with the patient face-to-face today.  More than 50% of time was dedicated to discussing his laboratory data, and the natural course of this findings as well as management strategies for his prostate cancer and bilateral hydronephrosis.  Roxy Cedar Brandolyn Shortridge,MD 8/28/201912:22 PM

## 2017-11-21 ENCOUNTER — Telehealth: Payer: Self-pay | Admitting: *Deleted

## 2017-11-21 DIAGNOSIS — C7951 Secondary malignant neoplasm of bone: Secondary | ICD-10-CM | POA: Diagnosis not present

## 2017-11-21 DIAGNOSIS — C61 Malignant neoplasm of prostate: Secondary | ICD-10-CM | POA: Diagnosis not present

## 2017-11-21 NOTE — Telephone Encounter (Signed)
-----   Message from Wyatt Portela, MD sent at 11/21/2017  3:06 PM EDT ----- Please let him know there is no DVT

## 2017-11-21 NOTE — Telephone Encounter (Signed)
As noted below by Dr. [name], I informed patient that he didn't have a DVT. He verbalized understanding.

## 2017-11-23 ENCOUNTER — Emergency Department: Payer: Medicare Other

## 2017-11-23 ENCOUNTER — Inpatient Hospital Stay
Admission: EM | Admit: 2017-11-23 | Discharge: 2017-12-02 | DRG: 682 | Disposition: A | Discharge status: Skilled Nursing Facility | Payer: Medicare Other | Attending: Internal Medicine | Admitting: Internal Medicine

## 2017-11-23 ENCOUNTER — Encounter: Payer: Self-pay | Admitting: Emergency Medicine

## 2017-11-23 ENCOUNTER — Other Ambulatory Visit: Payer: Self-pay

## 2017-11-23 ENCOUNTER — Inpatient Hospital Stay: Payer: Medicare Other

## 2017-11-23 DIAGNOSIS — I739 Peripheral vascular disease, unspecified: Secondary | ICD-10-CM | POA: Diagnosis present

## 2017-11-23 DIAGNOSIS — N329 Bladder disorder, unspecified: Secondary | ICD-10-CM | POA: Diagnosis present

## 2017-11-23 DIAGNOSIS — E872 Acidosis: Secondary | ICD-10-CM | POA: Diagnosis present

## 2017-11-23 DIAGNOSIS — N189 Chronic kidney disease, unspecified: Secondary | ICD-10-CM | POA: Diagnosis present

## 2017-11-23 DIAGNOSIS — N179 Acute kidney failure, unspecified: Secondary | ICD-10-CM

## 2017-11-23 DIAGNOSIS — R451 Restlessness and agitation: Secondary | ICD-10-CM | POA: Diagnosis not present

## 2017-11-23 DIAGNOSIS — Z86010 Personal history of colonic polyps: Secondary | ICD-10-CM | POA: Diagnosis not present

## 2017-11-23 DIAGNOSIS — G9340 Encephalopathy, unspecified: Secondary | ICD-10-CM | POA: Diagnosis not present

## 2017-11-23 DIAGNOSIS — N1330 Unspecified hydronephrosis: Secondary | ICD-10-CM | POA: Diagnosis present

## 2017-11-23 DIAGNOSIS — N132 Hydronephrosis with renal and ureteral calculous obstruction: Secondary | ICD-10-CM | POA: Diagnosis not present

## 2017-11-23 DIAGNOSIS — Z809 Family history of malignant neoplasm, unspecified: Secondary | ICD-10-CM

## 2017-11-23 DIAGNOSIS — E569 Vitamin deficiency, unspecified: Secondary | ICD-10-CM | POA: Diagnosis not present

## 2017-11-23 DIAGNOSIS — N139 Obstructive and reflux uropathy, unspecified: Secondary | ICD-10-CM | POA: Diagnosis not present

## 2017-11-23 DIAGNOSIS — D638 Anemia in other chronic diseases classified elsewhere: Secondary | ICD-10-CM | POA: Diagnosis present

## 2017-11-23 DIAGNOSIS — F0151 Vascular dementia with behavioral disturbance: Secondary | ICD-10-CM | POA: Diagnosis not present

## 2017-11-23 DIAGNOSIS — C7989 Secondary malignant neoplasm of other specified sites: Secondary | ICD-10-CM | POA: Diagnosis present

## 2017-11-23 DIAGNOSIS — J810 Acute pulmonary edema: Secondary | ICD-10-CM | POA: Diagnosis present

## 2017-11-23 DIAGNOSIS — E870 Hyperosmolality and hypernatremia: Secondary | ICD-10-CM | POA: Diagnosis not present

## 2017-11-23 DIAGNOSIS — I639 Cerebral infarction, unspecified: Secondary | ICD-10-CM | POA: Diagnosis not present

## 2017-11-23 DIAGNOSIS — N19 Unspecified kidney failure: Secondary | ICD-10-CM

## 2017-11-23 DIAGNOSIS — R109 Unspecified abdominal pain: Secondary | ICD-10-CM | POA: Diagnosis not present

## 2017-11-23 DIAGNOSIS — C61 Malignant neoplasm of prostate: Secondary | ICD-10-CM | POA: Diagnosis present

## 2017-11-23 DIAGNOSIS — R310 Gross hematuria: Secondary | ICD-10-CM | POA: Diagnosis present

## 2017-11-23 DIAGNOSIS — E43 Unspecified severe protein-calorie malnutrition: Secondary | ICD-10-CM

## 2017-11-23 DIAGNOSIS — Z436 Encounter for attention to other artificial openings of urinary tract: Secondary | ICD-10-CM | POA: Diagnosis not present

## 2017-11-23 DIAGNOSIS — E8770 Fluid overload, unspecified: Secondary | ICD-10-CM | POA: Diagnosis present

## 2017-11-23 DIAGNOSIS — E782 Mixed hyperlipidemia: Secondary | ICD-10-CM | POA: Diagnosis not present

## 2017-11-23 DIAGNOSIS — I63013 Cerebral infarction due to thrombosis of bilateral vertebral arteries: Secondary | ICD-10-CM | POA: Diagnosis not present

## 2017-11-23 DIAGNOSIS — G9341 Metabolic encephalopathy: Secondary | ICD-10-CM | POA: Diagnosis present

## 2017-11-23 DIAGNOSIS — J309 Allergic rhinitis, unspecified: Secondary | ICD-10-CM | POA: Diagnosis not present

## 2017-11-23 DIAGNOSIS — Z936 Other artificial openings of urinary tract status: Secondary | ICD-10-CM | POA: Diagnosis not present

## 2017-11-23 DIAGNOSIS — R918 Other nonspecific abnormal finding of lung field: Secondary | ICD-10-CM | POA: Diagnosis not present

## 2017-11-23 DIAGNOSIS — I63233 Cerebral infarction due to unspecified occlusion or stenosis of bilateral carotid arteries: Secondary | ICD-10-CM | POA: Diagnosis not present

## 2017-11-23 DIAGNOSIS — R4182 Altered mental status, unspecified: Secondary | ICD-10-CM | POA: Diagnosis not present

## 2017-11-23 DIAGNOSIS — R442 Other hallucinations: Secondary | ICD-10-CM | POA: Diagnosis not present

## 2017-11-23 DIAGNOSIS — E876 Hypokalemia: Secondary | ICD-10-CM | POA: Diagnosis present

## 2017-11-23 DIAGNOSIS — R443 Hallucinations, unspecified: Secondary | ICD-10-CM | POA: Diagnosis not present

## 2017-11-23 DIAGNOSIS — F05 Delirium due to known physiological condition: Secondary | ICD-10-CM | POA: Diagnosis not present

## 2017-11-23 DIAGNOSIS — M6281 Muscle weakness (generalized): Secondary | ICD-10-CM | POA: Diagnosis not present

## 2017-11-23 DIAGNOSIS — Z7982 Long term (current) use of aspirin: Secondary | ICD-10-CM

## 2017-11-23 DIAGNOSIS — Z79818 Long term (current) use of other agents affecting estrogen receptors and estrogen levels: Secondary | ICD-10-CM

## 2017-11-23 DIAGNOSIS — J189 Pneumonia, unspecified organism: Secondary | ICD-10-CM | POA: Diagnosis present

## 2017-11-23 DIAGNOSIS — N138 Other obstructive and reflux uropathy: Secondary | ICD-10-CM | POA: Diagnosis present

## 2017-11-23 DIAGNOSIS — R000 Tachycardia, unspecified: Secondary | ICD-10-CM | POA: Diagnosis not present

## 2017-11-23 DIAGNOSIS — Z87891 Personal history of nicotine dependence: Secondary | ICD-10-CM | POA: Diagnosis not present

## 2017-11-23 DIAGNOSIS — N3289 Other specified disorders of bladder: Secondary | ICD-10-CM | POA: Diagnosis not present

## 2017-11-23 DIAGNOSIS — E440 Moderate protein-calorie malnutrition: Secondary | ICD-10-CM | POA: Diagnosis not present

## 2017-11-23 DIAGNOSIS — I6930 Unspecified sequelae of cerebral infarction: Secondary | ICD-10-CM | POA: Diagnosis not present

## 2017-11-23 DIAGNOSIS — E875 Hyperkalemia: Secondary | ICD-10-CM | POA: Diagnosis present

## 2017-11-23 DIAGNOSIS — I5030 Unspecified diastolic (congestive) heart failure: Secondary | ICD-10-CM | POA: Diagnosis not present

## 2017-11-23 DIAGNOSIS — K5900 Constipation, unspecified: Secondary | ICD-10-CM | POA: Diagnosis not present

## 2017-11-23 DIAGNOSIS — Z79899 Other long term (current) drug therapy: Secondary | ICD-10-CM

## 2017-11-23 DIAGNOSIS — R410 Disorientation, unspecified: Secondary | ICD-10-CM | POA: Diagnosis not present

## 2017-11-23 DIAGNOSIS — R319 Hematuria, unspecified: Secondary | ICD-10-CM | POA: Diagnosis not present

## 2017-11-23 DIAGNOSIS — F0391 Unspecified dementia with behavioral disturbance: Secondary | ICD-10-CM | POA: Diagnosis present

## 2017-11-23 DIAGNOSIS — Z743 Need for continuous supervision: Secondary | ICD-10-CM | POA: Diagnosis not present

## 2017-11-23 HISTORY — PX: IR NEPHROSTOGRAM LEFT INITIAL PLACEMENT: IMG6057

## 2017-11-23 LAB — URINALYSIS, COMPLETE (UACMP) WITH MICROSCOPIC
Bacteria, UA: NONE SEEN
Bilirubin Urine: NEGATIVE
GLUCOSE, UA: NEGATIVE mg/dL
KETONES UR: NEGATIVE mg/dL
Leukocytes, UA: NEGATIVE
NITRITE: NEGATIVE
Protein, ur: 30 mg/dL — AB
Specific Gravity, Urine: 1.011 (ref 1.005–1.030)
Squamous Epithelial / LPF: NONE SEEN (ref 0–5)
pH: 5 (ref 5.0–8.0)

## 2017-11-23 LAB — CBC WITH DIFFERENTIAL/PLATELET
BASOS ABS: 0 10*3/uL (ref 0–0.1)
Basophils Relative: 0 %
Eosinophils Absolute: 0.1 10*3/uL (ref 0–0.7)
Eosinophils Relative: 2 %
HCT: 25.5 % — ABNORMAL LOW (ref 40.0–52.0)
HEMOGLOBIN: 8.7 g/dL — AB (ref 13.0–18.0)
LYMPHS PCT: 12 %
Lymphs Abs: 0.6 10*3/uL — ABNORMAL LOW (ref 1.0–3.6)
MCH: 30.4 pg (ref 26.0–34.0)
MCHC: 34.1 g/dL (ref 32.0–36.0)
MCV: 89.2 fL (ref 80.0–100.0)
Monocytes Absolute: 0.5 10*3/uL (ref 0.2–1.0)
Monocytes Relative: 10 %
NEUTROS ABS: 4 10*3/uL (ref 1.4–6.5)
NEUTROS PCT: 76 %
Platelets: 236 10*3/uL (ref 150–440)
RBC: 2.85 MIL/uL — AB (ref 4.40–5.90)
RDW: 14.7 % — ABNORMAL HIGH (ref 11.5–14.5)
WBC: 5.3 10*3/uL (ref 3.8–10.6)

## 2017-11-23 LAB — BASIC METABOLIC PANEL
ANION GAP: 13 (ref 5–15)
BUN: 100 mg/dL — ABNORMAL HIGH (ref 8–23)
CHLORIDE: 113 mmol/L — AB (ref 98–111)
CO2: 14 mmol/L — ABNORMAL LOW (ref 22–32)
Calcium: 9.7 mg/dL (ref 8.9–10.3)
Creatinine, Ser: 10.82 mg/dL — ABNORMAL HIGH (ref 0.61–1.24)
GFR calc Af Amer: 5 mL/min — ABNORMAL LOW (ref >60)
GFR, EST NON AFRICAN AMERICAN: 4 mL/min — AB (ref >60)
GLUCOSE: 92 mg/dL (ref 70–99)
POTASSIUM: 5.9 mmol/L — AB (ref 3.5–5.1)
Sodium: 140 mmol/L (ref 135–145)

## 2017-11-23 LAB — PROTIME-INR
INR: 1.13
PROTHROMBIN TIME: 14.4 s (ref 11.4–15.2)

## 2017-11-23 LAB — APTT: APTT: 26 s (ref 24–36)

## 2017-11-23 MED ORDER — SODIUM CHLORIDE 0.9 % IV SOLN
1.0000 g | Freq: Every day | INTRAVENOUS | Status: DC
  Administered 2017-11-23 – 2017-11-24 (×2): 1 g via INTRAVENOUS
  Filled 2017-11-23 (×2): qty 1

## 2017-11-23 MED ORDER — HYDROCODONE-ACETAMINOPHEN 5-325 MG PO TABS
1.0000 | ORAL_TABLET | Freq: Four times a day (QID) | ORAL | Status: DC | PRN
  Administered 2017-11-23: 1 via ORAL
  Administered 2017-11-27: 01:00:00 2 via ORAL
  Filled 2017-11-23 (×2): qty 2
  Filled 2017-11-23: qty 1

## 2017-11-23 MED ORDER — INSULIN ASPART 100 UNIT/ML ~~LOC~~ SOLN
10.0000 [IU] | Freq: Once | SUBCUTANEOUS | Status: AC
  Administered 2017-11-23: 10 [IU] via INTRAVENOUS
  Filled 2017-11-23: qty 1

## 2017-11-23 MED ORDER — MIDAZOLAM HCL 2 MG/2ML IJ SOLN
INTRAMUSCULAR | Status: AC | PRN
  Administered 2017-11-23: 1 mg via INTRAVENOUS

## 2017-11-23 MED ORDER — ONDANSETRON HCL 4 MG/2ML IJ SOLN
4.0000 mg | Freq: Once | INTRAMUSCULAR | Status: AC
  Administered 2017-11-23: 4 mg via INTRAVENOUS
  Filled 2017-11-23: qty 2

## 2017-11-23 MED ORDER — MIDAZOLAM HCL 5 MG/5ML IJ SOLN
INTRAMUSCULAR | Status: AC
  Filled 2017-11-23: qty 5

## 2017-11-23 MED ORDER — SODIUM CHLORIDE 0.9 % IV SOLN: 250.0000 mL | INTRAVENOUS | Status: DC | PRN

## 2017-11-23 MED ORDER — ACETAMINOPHEN 325 MG PO TABS
650.0000 mg | ORAL_TABLET | Freq: Four times a day (QID) | ORAL | Status: DC | PRN
  Administered 2017-12-01: 650 mg via ORAL
  Filled 2017-11-23: qty 2

## 2017-11-23 MED ORDER — LIDOCAINE HCL (PF) 1 % IJ SOLN
INTRAMUSCULAR | Status: AC
  Filled 2017-11-23: qty 30

## 2017-11-23 MED ORDER — SODIUM POLYSTYRENE SULFONATE 15 GM/60ML PO SUSP
30.0000 g | Freq: Once | ORAL | Status: AC
  Administered 2017-11-23: 30 g via ORAL
  Filled 2017-11-23: qty 120

## 2017-11-23 MED ORDER — SODIUM CHLORIDE 0.9 % IV SOLN
500.0000 mg | Freq: Every day | INTRAVENOUS | Status: DC
  Administered 2017-11-23 – 2017-11-24 (×2): 500 mg via INTRAVENOUS
  Filled 2017-11-23 (×2): qty 500

## 2017-11-23 MED ORDER — SODIUM CHLORIDE 0.9% FLUSH
3.0000 mL | Freq: Two times a day (BID) | INTRAVENOUS | Status: DC
  Administered 2017-11-23 (×2): 3 mL via INTRAVENOUS

## 2017-11-23 MED ORDER — POLYETHYLENE GLYCOL 3350 17 G PO PACK
17.0000 g | PACK | Freq: Every day | ORAL | Status: DC | PRN
  Administered 2017-12-02: 17 g via ORAL
  Filled 2017-11-23 (×3): qty 1

## 2017-11-23 MED ORDER — SODIUM CHLORIDE 0.9% FLUSH: 3.0000 mL | INTRAVENOUS | Status: DC | PRN

## 2017-11-23 MED ORDER — LORATADINE 10 MG PO TABS
10.0000 mg | ORAL_TABLET | Freq: Every day | ORAL | Status: DC | PRN
  Administered 2017-12-02: 10 mg via ORAL
  Filled 2017-11-23: qty 1

## 2017-11-23 MED ORDER — FENTANYL CITRATE (PF) 100 MCG/2ML IJ SOLN
INTRAMUSCULAR | Status: AC | PRN
  Administered 2017-11-23: 50 ug via INTRAVENOUS

## 2017-11-23 MED ORDER — ACETAMINOPHEN 650 MG RE SUPP: 650.0000 mg | Freq: Four times a day (QID) | RECTAL | Status: DC | PRN

## 2017-11-23 MED ORDER — ONDANSETRON HCL 4 MG PO TABS: 4.0000 mg | ORAL_TABLET | Freq: Four times a day (QID) | ORAL | Status: DC | PRN

## 2017-11-23 MED ORDER — PROMETHAZINE HCL 25 MG PO TABS
25.0000 mg | ORAL_TABLET | Freq: Four times a day (QID) | ORAL | Status: DC | PRN
  Administered 2017-11-27: 25 mg via ORAL
  Filled 2017-11-23 (×2): qty 1

## 2017-11-23 MED ORDER — DEXTROSE 50 % IV SOLN
1.0000 | Freq: Once | INTRAVENOUS | Status: AC
  Administered 2017-11-23: 50 mL via INTRAVENOUS
  Filled 2017-11-23: qty 50

## 2017-11-23 MED ORDER — CEFAZOLIN SODIUM-DEXTROSE 1-4 GM/50ML-% IV SOLN
INTRAVENOUS | Status: AC
  Filled 2017-11-23: qty 50

## 2017-11-23 MED ORDER — ONDANSETRON HCL 4 MG/2ML IJ SOLN
4.0000 mg | Freq: Four times a day (QID) | INTRAMUSCULAR | Status: DC | PRN
  Administered 2017-11-24: 05:00:00 4 mg via INTRAVENOUS
  Filled 2017-11-23: qty 2

## 2017-11-23 MED ORDER — IOPAMIDOL (ISOVUE-300) INJECTION 61%: 30.0000 mL | Freq: Once | INTRAVENOUS | Status: DC | PRN

## 2017-11-23 MED ORDER — MORPHINE SULFATE (PF) 4 MG/ML IV SOLN
4.0000 mg | Freq: Once | INTRAVENOUS | Status: AC
  Administered 2017-11-23: 4 mg via INTRAVENOUS
  Filled 2017-11-23: qty 1

## 2017-11-23 MED ORDER — PATIROMER SORBITEX CALCIUM 8.4 G PO PACK
8.4000 g | PACK | Freq: Every day | ORAL | Status: DC
  Administered 2017-11-23: 18:00:00 8.4 g via ORAL
  Filled 2017-11-23 (×2): qty 1

## 2017-11-23 MED ORDER — MEGESTROL ACETATE 400 MG/10ML PO SUSP
400.0000 mg | Freq: Two times a day (BID) | ORAL | Status: DC
  Administered 2017-11-23 – 2017-12-02 (×11): 400 mg via ORAL
  Filled 2017-11-23 (×20): qty 10

## 2017-11-23 MED ORDER — MORPHINE SULFATE (PF) 2 MG/ML IV SOLN
2.0000 mg | INTRAVENOUS | Status: DC | PRN
  Administered 2017-11-23 – 2017-11-27 (×8): 2 mg via INTRAVENOUS
  Filled 2017-11-23 (×8): qty 1

## 2017-11-23 MED ORDER — SODIUM BICARBONATE 8.4 % IV SOLN
50.0000 meq | Freq: Once | INTRAVENOUS | Status: AC
  Administered 2017-11-23: 50 meq via INTRAVENOUS
  Filled 2017-11-23: qty 50

## 2017-11-23 MED ORDER — FENTANYL CITRATE (PF) 100 MCG/2ML IJ SOLN
INTRAMUSCULAR | Status: AC
  Filled 2017-11-23: qty 2

## 2017-11-23 NOTE — Consult Note (Signed)
Urology Consult  Referring physician: Dr. Jerelyn Charles Reason for referral: prostate cancer, bilateral hydronephrosis, renal failure  Chief Complaint: fatigue  History of Present Illness: [name] is a 72yo with a hx of locally advanced prostate cancer who presented to the ER with fatigue and decreased urine output for the past 3 days. He denies any pain. Creatinine was 10 in the ER.  Ct showed moderate bilateral hydronephrosis and a bladder mass involving posterior bladder wall. He is normally followed by Dr. [name] at [facility] for his prostate cancer. He is also managed by Dr. [name] and Dr. Tammi Klippel. He denies any worsenign LUTS  Past Medical History:  Diagnosis Date  . Diverticulosis of colon   . Hx of colonic polyps   . Prostate cancer [facility])    Past Surgical History:  Procedure Laterality Date  . CARDIOVASCULAR STRESS TEST  1996   Negative  . IR FLUORO GUIDE PORT INSERTION RIGHT  01/09/2017  . IR NEPHROSTOGRAM LEFT INITIAL PLACEMENT  11/23/2017  . IR US GUIDE VASC ACCESS RIGHT  01/09/2017  . PORTACATH PLACEMENT      Medications: I have reviewed the patient's current medications. Allergies:  Allergies  Allergen Reactions  . Versed [Midazolam] Other (See Comments)    agitation    Family History  Problem Relation Age of Onset  . Heart attack Father   . Heart disease Father   . Heart attack Brother 17  . Heart disease Brother   . Heart attack Brother   . Heart disease Brother   . Cancer Sister        breast  . Hypertension Mother   . Diabetes Sister   . Cancer Maternal Aunt   . Cancer Maternal Aunt   . Asthma Son    Social History:  reports that he quit smoking about 27 years ago. His smoking use included cigarettes. He has a 10.00 pack-year smoking history. He has never used smokeless tobacco. He reports that he does not drink alcohol or use drugs.  Review of Systems  Constitutional: Positive for malaise/fatigue.  All other systems reviewed and are  negative.   Physical Exam:  Vital signs in last 24 hours: Temp:  [97.7 F (36.5 C)-98.1 F (36.7 C)] 97.7 F (36.5 C) (08/31 1348) Pulse Rate:  [82-129] 104 (08/31 1514) Resp:  [14-22] 18 (08/31 1514) BP: (143-169)/(82-106) 146/99 (08/31 1514) SpO2:  [90 %-100 %] 90 % (08/31 1514) Weight:  [79.8 kg-84.3 kg] 79.8 kg (08/31 1348) Physical Exam  Constitutional: He is oriented to person, place, and time. He appears well-developed and well-nourished.  HENT:  Head: Normocephalic and atraumatic.  Eyes: Pupils are equal, round, and reactive to light. EOM are normal.  Neck: Normal range of motion.  Cardiovascular: Normal rate and regular rhythm.  Respiratory: Effort normal. No respiratory distress.  GI: Soft. He exhibits no distension.  Musculoskeletal: Normal range of motion. He exhibits edema.  Neurological: He is alert and oriented to person, place, and time.  Skin: Skin is warm and dry.  Psychiatric: He has a normal mood and affect. His behavior is normal. Judgment and thought content normal.    Laboratory Data:  Results for orders placed or performed during the hospital encounter of 11/23/17 (from the past 72 hour(s))  CBC with Differential/Platelet     Status: Abnormal   Collection Time: 11/23/17  9:28 AM  Result Value Ref Range   WBC 5.3 3.8 - 10.6 K/uL   RBC 2.85 (L) 4.40 - 5.90 MIL/uL   Hemoglobin  8.7 (L) 13.0 - 18.0 g/dL   HCT 25.5 (L) 40.0 - 52.0 %   MCV 89.2 80.0 - 100.0 fL   MCH 30.4 26.0 - 34.0 pg   MCHC 34.1 32.0 - 36.0 g/dL   RDW 14.7 (H) 11.5 - 14.5 %   Platelets 236 150 - 440 K/uL   Neutrophils Relative % 76 %   Neutro Abs 4.0 1.4 - 6.5 K/uL   Lymphocytes Relative 12 %   Lymphs Abs 0.6 (L) 1.0 - 3.6 K/uL   Monocytes Relative 10 %   Monocytes Absolute 0.5 0.2 - 1.0 K/uL   Eosinophils Relative 2 %   Eosinophils Absolute 0.1 0 - 0.7 K/uL   Basophils Relative 0 %   Basophils Absolute 0.0 0 - 0.1 K/uL    Comment: Performed at [facility], [address] [place], [place] [address]  Basic metabolic panel     Status: Abnormal   Collection Time: 11/23/17  9:28 AM  Result Value Ref Range   Sodium 140 135 - 145 mmol/L   Potassium 5.9 (H) 3.5 - 5.1 mmol/L   Chloride 113 (H) 98 - 111 mmol/L   CO2 14 (L) 22 - 32 mmol/L   Glucose, Bld 92 70 - 99 mg/dL   BUN 100 (H) 8 - 23 mg/dL    Comment: RESULT CONFIRMED BY MANUAL DILUTION SNJ   Creatinine, Ser 10.82 (H) 0.61 - 1.24 mg/dL   Calcium 9.7 8.9 - 10.3 mg/dL   GFR calc non Af Amer 4 (L) >60 mL/min   GFR calc Af Amer 5 (L) >60 mL/min    Comment: (NOTE) The eGFR has been calculated using the CKD EPI equation. This calculation has not been validated in all clinical situations. eGFR's persistently <60 mL/min signify possible Chronic Kidney Disease.    Anion gap 13 5 - 15    Comment: Performed at [facility], [address] [place], [place] [address]  Protime-INR     Status: None   Collection Time: 11/23/17 11:38 AM  Result Value Ref Range   Prothrombin Time 14.4 11.4 - 15.2 seconds   INR 1.13     Comment: Performed at [facility], [address] [place], [place] [address]  APTT     Status: None   Collection Time: 11/23/17 11:38 AM  Result Value Ref Range   aPTT 26 24 - 36 seconds    Comment: Performed at [facility], [address] [place], [place] [address]  Urinalysis, Complete w Microscopic     Status: Abnormal   Collection Time: 11/23/17  4:25 PM  Result Value Ref Range   Color, Urine RED (A) YELLOW   APPearance HAZY (A) CLEAR   Specific Gravity, Urine 1.011 1.005 - 1.030   pH 5.0 5.0 - 8.0   Glucose, UA NEGATIVE NEGATIVE mg/dL   Hgb urine dipstick LARGE (A) NEGATIVE   Bilirubin Urine NEGATIVE NEGATIVE   Ketones, ur NEGATIVE NEGATIVE mg/dL   Protein, ur 30 (A) NEGATIVE mg/dL   Nitrite NEGATIVE NEGATIVE   Leukocytes, UA NEGATIVE NEGATIVE   RBC / HPF >50 (H) 0 - 5 RBC/hpf   WBC, UA 6-10 0 - 5 WBC/hpf   Bacteria, UA NONE SEEN NONE SEEN    Squamous Epithelial / LPF NONE SEEN 0 - 5    Comment: Performed at [facility], [address] [place],  [address]   No results found for this or any previous visit (from the past 240 hour(s)).  Creatinine: Recent Labs    11/19/17 1228 11/23/17 0928  CREATININE 4.53* 10.82*   Baseline Creatinine: 3  Impression/Assessment:  72yo with locally advanced prostate cancer, bilateral hydronephrosis and acute renal failure  Plan:  1. Bilateral hydronephrosis with acute renal failure: I discussed the management with the patient and given his locally advanced prostate cancer I recommended bilateral nephrostomy tube placement. Please consult Interventional radiology for placement of bilateral nephrostomy tubes  2. Prostate cancer: The patient is followed by Dr. [name], Dr. [name], and Dr. Tammi Klippel. He is scheduled to see them next week pending pending discharge from the hospital  [facility] 11/23/2017, 7:40 PM

## 2017-11-23 NOTE — Procedures (Signed)
Bilateral hydro with distal malignant obstruction  S/p BILATERAL PCNS No comp Stable ebl min Full report in pacs

## 2017-11-23 NOTE — ED Provider Notes (Signed)
[facility] Emergency Department Provider Note  ____________________________________________  Time seen: Approximately 9:26 AM  I have reviewed the triage vital signs and the nursing notes.   HISTORY  Chief Complaint Flank Pain; Back Pain; and Altered Mental Status   HPI [name] is a 72 y.o. male with a history of metastatic prostate cancer currently undergoing chemotherapy who presents for evaluation of confusion.  According to the wife patient has been very confused since yesterday.  He had subjective fever this morning at 4 AM.  She gave him Advil.  Patient is afebrile at this time.  Patient is complaining of bilateral flank pain for a month.  The pain is pressure, constant and nonradiating, worse with movement.  Patient has been taking hydrocodone at home which does not really help.  He has had mild dysuria for the last few days and the wife reports decreased urine output.  Has had progressively worsening swelling of his lower extremities and shortness of breath for the last month.  Shortness of breath is only with exertion and resolves with rest.  No cough or chest pain.  Past Medical History:  Diagnosis Date  . Diverticulosis of colon   . Hx of colonic polyps   . Prostate cancer [facility])     Patient Active Problem List   Diagnosis Date Noted  . Port-A-Cath in place 06/18/2017  . Encounter for antineoplastic chemotherapy 11/21/2016  . Acute seasonal allergic rhinitis due to pollen 12/28/2015  . Prostate cancer metastatic to multiple sites ([place]) 12/24/2014  . Advance directive discussed with patient 12/24/2014  . Prostate cancer ([place]) 02/23/2014  . Benign paroxysmal positional vertigo 12/07/2013  . Back pain 12/09/2012  . Goals of care, counseling/discussion 07/14/2010  . DIVERTICULOSIS, COLON 10/08/2006    Past Surgical History:  Procedure Laterality Date  . CARDIOVASCULAR STRESS TEST  1996   Negative  . IR FLUORO GUIDE PORT INSERTION RIGHT   01/09/2017  . IR US GUIDE VASC ACCESS RIGHT  01/09/2017  . PORTACATH PLACEMENT      Prior to Admission medications   Medication Sig Start Date End Date Taking? Authorizing Provider  aspirin 81 MG tablet Take 81 mg by mouth every morning.    Yes [provider]  diphenhydramine-acetaminophen (TYLENOL PM) 25-500 MG TABS tablet Take 2 tablets by mouth at bedtime as needed. 08/19/15  Yes [provider]  HYDROcodone-acetaminophen (NORCO) 5-325 MG tablet 1/2 to 1 tablet Q 6 hours prn pain 10/31/17  Yes Tanner, Lyndon Code., PA-C  lidocaine-prilocaine (EMLA) cream Apply 1 application topically as needed. Apply to portacath on the day of chemotherapy. 01/23/17  Yes Wyatt Portela, MD  loratadine (CLARITIN) 10 MG tablet Take 10 mg by mouth daily as needed for allergies.   Yes [provider]  megestrol (MEGACE) 40 MG/ML suspension TAKE 10 MLS (400 MG TOTAL) BY MOUTH 2 (TWO) TIMES DAILY. Patient taking differently: Take 400 mg by mouth 2 (two) times daily.  11/21/17  Yes Wyatt Portela, MD  ondansetron (ZOFRAN) 4 MG tablet Take 1 tablet (4 mg total) by mouth every 8 (eight) hours as needed for nausea or vomiting. 08/07/17  Yes Wyatt Portela, MD  promethazine (PHENERGAN) 25 MG tablet Take 1 tablet (25 mg total) by mouth every 6 (six) hours as needed for nausea or vomiting. 08/07/17  Yes Wyatt Portela, MD  dexamethasone (DECADRON) 4 MG tablet Take one tablet twice a day. Start 2 days after chemotherapy for 5 days. Patient not  taking: Reported on 05/27/2017 01/23/17   Wyatt Portela, MD  prochlorperazine (COMPAZINE) 10 MG tablet Take 1 tablet (10 mg total) by mouth every 6 (six) hours as needed for nausea or vomiting. Patient not taking: Reported on 05/27/2017 08/10/16   Wyatt Portela, MD    Allergies Patient has no known allergies.  Family History  Problem Relation Age of Onset  . Heart attack Father   . Heart disease Father   . Heart attack Brother 6  . Heart disease Brother    . Heart attack Brother   . Heart disease Brother   . Cancer Sister        breast  . Hypertension Mother   . Diabetes Sister   . Cancer Maternal Aunt   . Cancer Maternal Aunt   . Asthma Son     Social History Social History   Tobacco Use  . Smoking status: Former Smoker    Packs/day: 1.00    Years: 10.00    Pack years: 10.00    Types: Cigarettes    Last attempt to quit: 03/26/1990    Years since quitting: 27.6  . Smokeless tobacco: Never Used  Substance Use Topics  . Alcohol use: No    Frequency: Never  . Drug use: No    Review of Systems  Constitutional: + fever and confusion Eyes: Negative for visual changes. ENT: Negative for sore throat. Neck: No neck pain  Cardiovascular: Negative for chest pain. Respiratory: Negative for shortness of breath. Gastrointestinal: Negative for abdominal pain, vomiting or diarrhea. Genitourinary: + dysuria and bilateral flank pain Musculoskeletal: Negative for back pain. Skin: Negative for rash. Neurological: Negative for headaches, weakness or numbness. Psych: No SI or HI  ____________________________________________   PHYSICAL EXAM:  VITAL SIGNS: ED Triage Vitals  Enc Vitals Group     BP 11/23/17 0841 (!) 159/103     Pulse Rate 11/23/17 0841 90     Resp 11/23/17 0841 16     Temp 11/23/17 0841 98.1 F (36.7 C)     Temp Source 11/23/17 0841 Oral     SpO2 11/23/17 0841 98 %     Weight 11/23/17 0840 185 lb 14.4 oz (84.3 kg)     Height 11/23/17 0840 6' (1.829 m)     Head Circumference --      Peak Flow --      Pain Score 11/23/17 0840 7     Pain Loc --      Pain Edu? --      Excl. in [place]? --     Constitutional: Alert and oriented x 3. Well appearing and in no apparent distress. HEENT:      Head: Normocephalic and atraumatic.         Eyes: Conjunctivae are normal. Sclera is non-icteric.       Mouth/Throat: Mucous membranes are moist.       Neck: Supple with no signs of meningismus. Cardiovascular: Regular rate and  rhythm. No murmurs, gallops, or rubs. 2+ symmetrical distal pulses are present in all extremities. No JVD. Respiratory: Normal respiratory effort. Bilateral crackles Gastrointestinal: Soft, non tender, and non distended with positive bowel sounds. No rebound or guarding. Genitourinary: No CVA tenderness. Musculoskeletal: No midline CT and L-spine tenderness.  2+ pitting edema on the RLE and no edema on the LLE. Neurologic: Normal speech and language. Face is symmetric. Moving all extremities. No gross focal neurologic deficits are appreciated. Skin: Skin is warm, dry and intact. No rash noted. Psychiatric: Mood  and affect are normal. Speech and behavior are normal.  ____________________________________________   LABS (all labs ordered are listed, but only abnormal results are displayed)  Labs Reviewed  CBC WITH DIFFERENTIAL/PLATELET - Abnormal; Notable for the following components:      Result Value   RBC 2.85 (*)    Hemoglobin 8.7 (*)    HCT 25.5 (*)    RDW 14.7 (*)    Lymphs Abs 0.6 (*)    All other components within normal limits  BASIC METABOLIC PANEL - Abnormal; Notable for the following components:   Potassium 5.9 (*)    Chloride 113 (*)    CO2 14 (*)    BUN 100 (*)    Creatinine, Ser 10.82 (*)    GFR calc non Af Amer 4 (*)    GFR calc Af Amer 5 (*)    All other components within normal limits  URINALYSIS, COMPLETE (UACMP) WITH MICROSCOPIC  PROTIME-INR  APTT   ____________________________________________  EKG  ED ECG REPORT I, Rudene Re, the attending physician, personally viewed and interpreted this ECG.  Normal sinus rhythm, rate of 85, normal intervals, normal axis, no ST elevations or depressions, no peaked T waves. ____________________________________________  RADIOLOGY  I have personally reviewed the images performed during this visit and I agree with the Radiologist's read.   Interpretation by Radiologist:  Dg Chest 2 View  Result Date:  11/23/2017 CLINICAL DATA:  72 year old male with fever, confusion, possible volume overload. EXAM: CHEST - 2 VIEW COMPARISON:  Prior chest x-ray 06/23/2017 FINDINGS: Right IJ approach single-lumen power injectable port catheter. The catheter tip overlies the superior cavoatrial junction. Cardiac and mediastinal contours remain within normal limits. Atherosclerotic calcifications present in the transverse aorta. Slightly increased interstitial prominence diffusely with small Kerley B-lines in the periphery consistent with minimal fluid overload. No overt pulmonary edema. Mild vascular congestion. No focal airspace opacity. Patchy airspace opacity in the posterior left lower lobe. IMPRESSION: 1. Patchy left lower lobe airspace opacity which may reflect bronchopneumonia in the appropriate clinical setting. 2. Slightly increased interstitial prominence consistent with very mild interstitial edema/volume overload. 3. Stable position of right IJ single-lumen power injectable port catheter with the catheter tip overlying the superior cavoatrial junction. 4.  Aortic Atherosclerosis (ICD10-170.0) Electronically Signed   By: Jacqulynn Cadet M.D.   On: 11/23/2017 10:22      ____________________________________________   PROCEDURES  Procedure(s) performed: None Procedures Critical Care performed: yes  CRITICAL CARE Performed by: Rudene Re  ?  Total critical care time: 40 min  Critical care time was exclusive of separately billable procedures and treating other patients.  Critical care was necessary to treat or prevent imminent or life-threatening deterioration.  Critical care was time spent personally by me on the following activities: development of treatment plan with patient and/or surrogate as well as nursing, discussions with consultants, evaluation of patient's response to treatment, examination of patient, obtaining history from patient or surrogate, ordering and performing treatments  and interventions, ordering and review of laboratory studies, ordering and review of radiographic studies, pulse oximetry and re-evaluation of patient's condition.  ____________________________________________   INITIAL IMPRESSION / ASSESSMENT AND PLAN / ED COURSE   72 y.o. male with a history of metastatic prostate cancer currently undergoing chemotherapy who presents for evaluation of confusion and subjective fever since last night.  # confusion: Patient is alert and oriented x3 with a GCS of 15 at this time.  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  Looks volume overloaded with pitting edema and crackles on exam.  No respiratory distress.  Will check labs to rule out elevated BUN causing uremic encephalopathy vs sepsis with report of fever at home. Neuro intact with no HA, low suspicion for intra-cranial mets.  #Asymmetric leg swelling: Per wife patient underwent a venous Doppler study yesterday which was negative for DVT.  I am unable to find record of the study at this time.  We will hold off on repeating the ultrasound  # bilateral flank pain: most likely from hydronephrosis seen on CT done 2 weeks ago. Will rule out pyelonephritis. Will give morphine for pain. No bony tenderness or signs of cauda equina.   Clinical Course as of Nov 23 1112  Sat Nov 23, 2017  1045 Labs consistent with acute on chronic kidney failure and hyperkalemia. PVD 85 cc. Discussed with Dr. Alyson Ingles Urology who recommended bilateral nephrostomy tubes since patient has no bladder obstruction.  Discussed with Dr. Juleen China, nephrology who agrees with nephrostomy tubes and does not recommend dialysis at this time.  He does recommend close monitoring of patient's creatinine and potassium.  His EKG does not show any hyperkalemic changes.  Patient was given bicarb, D50 and insulin for treatment of hyperkalemia.  Discussed with Dr. Jerelyn Charles for  admission at this time.   [CV]    Clinical Course User Index [CV] Alfred [name] [place], MD   _________________________ 11:16 AM on 11/23/2017 ----------------------------------------- Spoke with Dr. Reesa Chew IR who will come into the hospital to place bilateral nephrostomy tubes today.    As part of my medical decision making, I reviewed the following data within the [place] notes reviewed and incorporated, Labs reviewed , EKG interpreted , Old chart reviewed, Radiograph reviewed , Discussed with admitting physician , A consult was requested and obtained from this/these consultant(s) Urology, Nephrology, IR, Notes from prior ED visits and [place] Controlled Substance Database    Pertinent labs & imaging results that were available during my care of the patient were reviewed by me and considered in my medical decision making (see chart for details).    ____________________________________________   FINAL CLINICAL IMPRESSION(S) / ED DIAGNOSES  Final diagnoses:  Acute renal failure superimposed on chronic kidney disease, unspecified CKD stage, unspecified acute renal failure type (HCC)  Uremic encephalopathy  Hyperkalemia  Hypervolemia, unspecified hypervolemia type      NEW MEDICATIONS STARTED DURING THIS VISIT:  ED Discharge Orders    None       Note:  This document was prepared using Dragon voice recognition software and may include unintentional dictation errors.    Alfred [name], [place], MD 11/23/17 1116

## 2017-11-23 NOTE — H&P (Signed)
[name] at [place] NAME: [name]    MR#:  [id number]  DATE OF BIRTH:  Aug 21, 1945  DATE OF ADMISSION:  11/23/2017  PRIMARY CARE PHYSICIAN: [name], MD   REQUESTING/REFERRING PHYSICIAN:   CHIEF COMPLAINT:   Chief Complaint  Patient presents with  . Flank Pain  . Back Pain  . Altered Mental Status    HISTORY OF PRESENT ILLNESS: [name]  is a 72 y.o. 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 Kolluru/nephrology as well as urology/Dr. Alyson [name] did see patient in the emergency room, patient for bilateral nephrostomy tube placement per ED attending, patient evaluated in the emergency room, no apparent distress, wife at the bedside, patient now be noted for acute renal failure secondary to post renal/bladder obstruction due to prostate cancer.  PAST MEDICAL HISTORY:   Past Medical History:  Diagnosis Date  . Diverticulosis of colon   . Hx of colonic polyps   . Prostate cancer ([facility])     PAST SURGICAL HISTORY:  Past Surgical History:  Procedure Laterality Date  . CARDIOVASCULAR STRESS TEST  1996   Negative  . IR FLUORO GUIDE PORT INSERTION RIGHT  01/09/2017  . IR US GUIDE VASC ACCESS RIGHT  01/09/2017  . PORTACATH PLACEMENT      SOCIAL HISTORY:  Social History   Tobacco Use  . Smoking status: Former Smoker    Packs/day: 1.00    Years: 10.00    Pack years: 10.00    Types: Cigarettes     Last attempt to quit: 03/26/1990    Years since quitting: 27.6  . Smokeless tobacco: Never Used  Substance Use Topics  . Alcohol use: No    Frequency: Never    FAMILY HISTORY:  Family History  Problem Relation Age of Onset  . Heart attack Father   . Heart disease Father   . Heart attack Brother 74  . Heart disease Brother   . Heart attack Brother   . Heart disease Brother   . Cancer Sister        breast  . Hypertension Mother   . Diabetes Sister   . Cancer Maternal Aunt   . Cancer Maternal Aunt   . Asthma Son     DRUG ALLERGIES: No Known Allergies  REVIEW OF SYSTEMS:   CONSTITUTIONAL: + fever, fatigue, weakness.  EYES: No blurred or double vision.  EARS, NOSE, AND THROAT: No tinnitus or ear pain.  RESPIRATORY: No cough, shortness of breath, wheezing or hemoptysis.  CARDIOVASCULAR: No chest pain, orthopnea, edema.  GASTROINTESTINAL: + nausea, n0 vomiting, diarrhea or abdominal pain.  GENITOURINARY: + dysuria, no hematuria.  ENDOCRINE: No polyuria, nocturia,  HEMATOLOGY: No anemia, easy bruising or bleeding SKIN: No rash or lesion. MUSCULOSKELETAL: No joint pain or arthritis.   NEUROLOGIC: No tingling, numbness, weakness.  PSYCHIATRY: No anxiety or depression.   MEDICATIONS AT HOME:  Prior to Admission medications   Medication Sig Start Date End Date Taking? Authorizing Provider  aspirin 81 MG tablet Take  81 mg by mouth every morning.    Yes [provider]  diphenhydramine-acetaminophen (TYLENOL PM) 25-500 MG TABS tablet Take 2 tablets by mouth at bedtime as needed. 08/19/15  Yes [provider]  HYDROcodone-acetaminophen (NORCO) 5-325 MG tablet 1/2 to 1 tablet Q 6 hours prn pain 10/31/17  Yes Tanner, Lyndon Code., PA-C  lidocaine-prilocaine (EMLA) cream Apply 1 application topically as needed. Apply to portacath on the day of chemotherapy. 01/23/17  Yes Wyatt Portela, MD  loratadine (CLARITIN) 10 MG tablet Take 10 mg by mouth daily as needed for allergies.    Yes [provider]  megestrol (MEGACE) 40 MG/ML suspension TAKE 10 MLS (400 MG TOTAL) BY MOUTH 2 (TWO) TIMES DAILY. Patient taking differently: Take 400 mg by mouth 2 (two) times daily.  11/21/17  Yes Wyatt Portela, MD  ondansetron (ZOFRAN) 4 MG tablet Take 1 tablet (4 mg total) by mouth every 8 (eight) hours as needed for nausea or vomiting. 08/07/17  Yes Wyatt Portela, MD  promethazine (PHENERGAN) 25 MG tablet Take 1 tablet (25 mg total) by mouth every 6 (six) hours as needed for nausea or vomiting. 08/07/17  Yes Wyatt Portela, MD  dexamethasone (DECADRON) 4 MG tablet Take one tablet twice a day. Start 2 days after chemotherapy for 5 days. Patient not taking: Reported on 05/27/2017 01/23/17   Wyatt Portela, MD      PHYSICAL EXAMINATION:   VITAL SIGNS: Blood pressure (!) 152/82, pulse 82, temperature 98.1 F (36.7 C), temperature source Oral, resp. rate 19, height 6' (1.829 m), weight 84.3 kg, SpO2 96 %.  GENERAL:  72 y.o.-year-old patient lying in the bed with no acute distress.  Mild frailty EYES: Pupils equal, round, reactive to light and accommodation. No scleral icterus. Extraocular muscles intact.  HEENT: Head atraumatic, normocephalic. Oropharynx and nasopharynx clear.  NECK:  Supple, no jugular venous distention. No thyroid enlargement, no tenderness.  LUNGS: Normal breath sounds bilaterally, no wheezing, rales,rhonchi or crepitation. No use of accessory muscles of respiration.  CARDIOVASCULAR: S1, S2 normal. No murmurs, rubs, or gallops.  ABDOMEN: Soft, nontender, nondistended. Bowel sounds present. No organomegaly or mass.  EXTREMITIES: No pedal edema, cyanosis, or clubbing.  NEUROLOGIC: Cranial nerves II through XII are intact. Muscle strength 5/5 in all extremities. Sensation intact. Gait not checked.  PSYCHIATRIC: The patient is alert and oriented x 3, mild confusion.  SKIN: No obvious rash, lesion, or ulcer.   LABORATORY PANEL:   CBC Recent Labs  Lab  11/23/17 0928  WBC 5.3  HGB 8.7*  HCT 25.5*  PLT 236  MCV 89.2  MCH 30.4  MCHC 34.1  RDW 14.7*  LYMPHSABS 0.6*  MONOABS 0.5  EOSABS 0.1  BASOSABS 0.0   ------------------------------------------------------------------------------------------------------------------  Chemistries  Recent Labs  Lab 11/19/17 1228 11/23/17 0928  NA 144 140  K 4.7 5.9*  CL 115* 113*  CO2 18* 14*  GLUCOSE 88 92  BUN 50* 100*  CREATININE 4.53* 10.82*  CALCIUM 9.9 9.7  AST 11*  --   ALT 6  --   ALKPHOS 75  --   BILITOT 0.3  --    ------------------------------------------------------------------------------------------------------------------ estimated creatinine clearance is 7 mL/min (A) (by C-G formula based on SCr of 10.82 mg/dL (H)). ------------------------------------------------------------------------------------------------------------------ No results for input(s): TSH, T4TOTAL, T3FREE, THYROIDAB in the last 72 hours.  Invalid input(s): FREET3   Coagulation profile No results for input(s): INR, PROTIME in the last 168 hours. ------------------------------------------------------------------------------------------------------------------- No results for input(s): DDIMER in the  last 72 hours. -------------------------------------------------------------------------------------------------------------------  Cardiac Enzymes No results for input(s): CKMB, TROPONINI, MYOGLOBIN in the last 168 hours.  Invalid input(s): CK ------------------------------------------------------------------------------------------------------------------ Invalid input(s): POCBNP  ---------------------------------------------------------------------------------------------------------------  Urinalysis    Component Value Date/Time   COLORURINE YELLOW 10/31/2017 0931   APPEARANCEUR HAZY (A) 10/31/2017 0931   LABSPEC 1.012 10/31/2017 0931   PHURINE 5.0 10/31/2017 0931   GLUCOSEU NEGATIVE  10/31/2017 0931   HGBUR MODERATE (A) 10/31/2017 0931   BILIRUBINUR NEGATIVE 10/31/2017 0931   KETONESUR NEGATIVE 10/31/2017 0931   PROTEINUR 30 (A) 10/31/2017 0931   NITRITE NEGATIVE 10/31/2017 0931   LEUKOCYTESUR NEGATIVE 10/31/2017 0931     RADIOLOGY: Dg Chest 2 View  Result Date: 11/23/2017 CLINICAL DATA:  72 year old male with fever, confusion, possible volume overload. EXAM: CHEST - 2 VIEW COMPARISON:  Prior chest x-ray 06/23/2017 FINDINGS: Right IJ approach single-lumen power injectable port catheter. The catheter tip overlies the superior cavoatrial junction. Cardiac and mediastinal contours remain within normal limits. Atherosclerotic calcifications present in the transverse aorta. Slightly increased interstitial prominence diffusely with small Kerley B-lines in the periphery consistent with minimal fluid overload. No overt pulmonary edema. Mild vascular congestion. No focal airspace opacity. Patchy airspace opacity in the posterior left lower lobe. IMPRESSION: 1. Patchy left lower lobe airspace opacity which may reflect bronchopneumonia in the appropriate clinical setting. 2. Slightly increased interstitial prominence consistent with very mild interstitial edema/volume overload. 3. Stable position of right IJ single-lumen power injectable port catheter with the catheter tip overlying the superior cavoatrial junction. 4.  Aortic Atherosclerosis (ICD10-170.0) Electronically Signed   By: Jacqulynn Cadet M.D.   On: 11/23/2017 10:22   Ct Renal Stone Study  Result Date: 11/23/2017 CLINICAL DATA:  Chronic, yet, increasing pelvic and right flank pain due to prostate cancer and bilateral hydronephrosis. 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 Some confusion also noted today. EXAM: CT ABDOMEN AND PELVIS WITHOUT CONTRAST TECHNIQUE: Multidetector CT imaging of the abdomen and pelvis was performed following the standard protocol without IV contrast.  COMPARISON:  11/01/2017 FINDINGS: Lower chest: The heart is normal in size. Trace pleural effusions. Lung base interstitial thickening. Mild dependent subsegmental atelectasis. Hepatobiliary: Liver normal in size. Multiple small low-density liver lesions are noted consistent with cysts, stable from the prior CT. No new liver abnormalities. Small dependent gallstones. No evidence of acute cholecystitis. No bile duct dilation. Pancreas: Unremarkable. No pancreatic ductal dilatation or surrounding inflammatory changes. Spleen: Normal in size without focal abnormality. Adrenals/Urinary Tract: No adrenal masses. Marked right and moderate left hydronephrosis. There is significant right renal atrophy. No renal masses. No intrarenal stones. Both ureters are dilated, moderate to marked on the right and moderate on the left. These findings are due to an irregular bladder mass, which is contiguous with the prostate. Mass involves the posterior bladder wall, right greater than left. No ureteral stones. There is stranding fat adjacent to the bladder, particularly posteriorly between the prostate, bladder and seminal vesicles. Right seminal vesicle is mildly enlarged and nodular. These changes similar to the prior CT. Stomach/Bowel: Stomach is within normal limits. Appendix appears normal. No evidence of bowel wall thickening, distention, or inflammatory changes. Vascular/Lymphatic: There are several prominent pelvic lymph nodes, largest to the left of the left common iliac artery measuring 6 mm in short axis. These are stable from the prior CT. Aortic atherosclerotic calcifications.  No aneurysm. Reproductive: Mildly enlarged heterogeneous prostate extends into the bladder, without significant change from the prior CT. Other: Subcutaneous edema  as increased when compared the prior CT, noted along the pelvis and upper thighs, right greater than left. No ascites. Small left inguinal hernia contains fat and fluid. Musculoskeletal:  Sclerotic metastatic disease to the axial skeleton. There are compression fractures of T9, T11 and L1. The skeletal appearance is stable from the prior CT. IMPRESSION: 1. Marked right and moderate left hydronephrosis. Bilateral hydro ureters, right greater than left. The bilateral obstructive uropathy is due to locally invasive prostate carcinoma invading the bladder and involving the ureterovesicular junctions. Mildly prominent, but not pathologically enlarged, pelvic lymph nodes, suspicious for metastatic disease. The appearance is stable from the recent prior CT. 2. Trace pleural effusions with lung base interstitial thickening suggests mild interstitial edema. There is also subcutaneous edema in the lower abdomen and upper thighs, greater on the right. These findings are new since the prior CT. 3. Bony metastatic disease and multiple compression fractures are stable from the recent CT. 4. No acute findings in the abdomen or pelvis. 5. Aortic atherosclerosis. 6. Gallstones. Electronically Signed   By: Lajean Manes M.D.   On: 11/23/2017 11:30    EKG: Orders placed or performed during the hospital encounter of 11/23/17  . ED EKG  . ED EKG    IMPRESSION AND PLAN: *Acute renal failure due to prostate cancer/mass *Acute encephalopathy secondary to uremia *Acute hyperkalemia *Acute possible community-acquired pneumonia *Chronic worsening metastatic prostate cancer-on chemo/radiation  Admit to regular medicine floor bed, nephrology/urology following, IR consulted for bilateral nephrostomy tube placement and discussion with ED attending, strict I&O monitoring, daily weights, Veltassa for now, Kayexalate x1, BMP daily, empiric Rocephin/azithromycin for now, blood cultures, supplemental oxygen wean as tolerated, and continue close medical monitoring    All the records are reviewed and case discussed with ED provider. Management plans discussed with the patient, family and they are in  agreement.  CODE STATUS:full Code Status History    Date Active Date Inactive Code Status Order ID Comments User Context   04/30/2014 1304 05/01/2014 0336 Full Code [id number]  Sandi Mariscal, MD HOV    Advance Directive Documentation     Most Recent Value  Type of Advance Directive  Healthcare Power of Attorney  Pre-existing out of facility DNR order (yellow form or pink MOST form)  -  "MOST" Form in Place?  -       TOTAL TIME TAKING CARE OF THIS PATIENT: 45 minutes.    Avel Peace Momodou Consiglio M.D on 11/23/2017   Between 7am to 6pm - Pager - [phone]  After 6pm go to [email or web address] - password [email or web address] [facility]  Office  [phone]  CC: Primary care physician; [name], MD   Note: This dictation was prepared with Dragon dictation along with smaller phrase technology. Any transcriptional errors that result from this process are unintentional.

## 2017-11-23 NOTE — ED Notes (Signed)
Urology at bedside.

## 2017-11-23 NOTE — Sedation Documentation (Signed)
Patient severly agitated during procedure.  Unable to document vital signs however, bp, hr, resp , and etco2 wnl during procedure.

## 2017-11-23 NOTE — ED Triage Notes (Signed)
C/O pain and confusion and fever x 1 day.  C/O low back and bilateral side pain x 1 month, and confusion and fever x 1 day.  Patient has prostate ca and history of chemo and radiation -- last radiation was 8/28.  Wife states that they are concerned the tumor is blocking the kidneys and bladder.

## 2017-11-23 NOTE — Progress Notes (Signed)
Family Meeting Note  Advance Directive:yes  Today a meeting took place with the Patient.  Patient is able to participate   The following clinical team members were present during this meeting:MD  The following were discussed:Patient's diagnosis: Metastatic prostate cancer, Patient's progosis: Unable to determine and Goals for treatment: Full Code  Additional follow-up to be provided: prn  Time spent during discussion:20 minutes  Gorden Harms, MD

## 2017-11-23 NOTE — ED Notes (Signed)
Blue top sent to lab at this time 

## 2017-11-23 NOTE — Progress Notes (Signed)
Received report from RN specials reported that pt given versed during procedure and after became more confused and agitated, bilat neph tubes placed

## 2017-11-23 NOTE — ED Notes (Signed)
.   Pt is resting, Respirations even and unlabored, NAD. Stretcher lowest postion and locked. Call bell within reach. Denies any needs at this time RN will continue to monitor.    

## 2017-11-24 DIAGNOSIS — E43 Unspecified severe protein-calorie malnutrition: Secondary | ICD-10-CM

## 2017-11-24 LAB — BASIC METABOLIC PANEL
Anion gap: 13 (ref 5–15)
BUN: 82 mg/dL — ABNORMAL HIGH (ref 8–23)
CHLORIDE: 115 mmol/L — AB (ref 98–111)
CO2: 16 mmol/L — AB (ref 22–32)
CREATININE: 7.31 mg/dL — AB (ref 0.61–1.24)
Calcium: 9.5 mg/dL (ref 8.9–10.3)
GFR calc non Af Amer: 7 mL/min — ABNORMAL LOW (ref >60)
GFR, EST AFRICAN AMERICAN: 8 mL/min — AB (ref >60)
Glucose, Bld: 119 mg/dL — ABNORMAL HIGH (ref 70–99)
Potassium: 4.9 mmol/L (ref 3.5–5.1)
Sodium: 144 mmol/L (ref 135–145)

## 2017-11-24 MED ORDER — ADULT MULTIVITAMIN W/MINERALS CH
1.0000 | ORAL_TABLET | Freq: Every day | ORAL | Status: DC
  Administered 2017-11-24 – 2017-12-02 (×5): 1 via ORAL
  Filled 2017-11-24 (×5): qty 1

## 2017-11-24 MED ORDER — QUETIAPINE FUMARATE 25 MG PO TABS
25.0000 mg | ORAL_TABLET | Freq: Once | ORAL | Status: AC
  Administered 2017-11-24: 22:00:00 25 mg via ORAL
  Filled 2017-11-24: qty 1

## 2017-11-24 MED ORDER — SODIUM BICARBONATE 8.4 % IV SOLN
INTRAVENOUS | Status: AC
  Administered 2017-11-24: 14:00:00 via INTRAVENOUS
  Filled 2017-11-24 (×2): qty 150

## 2017-11-24 MED ORDER — SODIUM CHLORIDE 0.9% FLUSH
10.0000 mL | Freq: Two times a day (BID) | INTRAVENOUS | Status: DC
  Administered 2017-11-24 – 2017-12-02 (×17): 10 mL

## 2017-11-24 MED ORDER — NEPRO/CARBSTEADY PO LIQD
237.0000 mL | Freq: Two times a day (BID) | ORAL | Status: DC
  Administered 2017-11-24 – 2017-12-02 (×7): 237 mL via ORAL

## 2017-11-24 NOTE — Consult Note (Signed)
[facility]  CONSULT NOTE    Date: 11/24/2017                  Patient Name:  [name]  MRN: [id number]  DOB: [birth year]  Age / Sex: 72 y.o., male         PCP: [name], MD                 Service Requesting Consult: Dr. Jerelyn Charles                 Reason for Consult: Acute renal failure            History of Present Illness: [name] is a 72 y.o. white male with prostate cancer who was admitted to [facility] on 11/23/2017 for Hyperkalemia [E87.5] ARF (acute renal failure) ([place]) [N17.9] Uremic encephalopathy [G93.41, N19] Hypervolemia, unspecified hypervolemia type [E87.70] Acute renal failure superimposed on chronic kidney disease, unspecified CKD stage, unspecified acute renal failure type ([place]) [N17.9, N18.9]   Patient was admitted with creatinine of 10.82 from baseline of 1.19, normal GFR in 6/24. Progression of worsening renal failure over last month.   Intervential radiology placed bilateral percutaneous nephrostomy.   Wife at bedside to give history. Patient with altered mental status.   Medications: Outpatient medications: Medications Prior to Admission  Medication Sig Dispense Refill Last Dose  . aspirin 81 MG tablet Take 81 mg by mouth every morning.    11/22/2017 at Unknown time  . diphenhydramine-acetaminophen (TYLENOL PM) 25-500 MG TABS tablet Take 2 tablets by mouth at bedtime as needed.   PRN at PRN  . HYDROcodone-acetaminophen (NORCO) 5-325 MG tablet 1/2 to 1 tablet Q 6 hours prn pain 30 tablet 0 PRN at PRN  . lidocaine-prilocaine (EMLA) cream Apply 1 application topically as needed. Apply to portacath on the day of chemotherapy. 30 g 3 PRN at PRN  . loratadine (CLARITIN) 10 MG tablet Take 10 mg by mouth daily as needed for allergies.   PRN at PRN  . megestrol (MEGACE) 40 MG/ML suspension TAKE 10 MLS (400 MG TOTAL) BY MOUTH 2 (TWO) TIMES DAILY. (Patient taking differently: Take 400 mg by mouth 2 (two) times daily. ) 240 mL 0  Past Week at Unknown time  . ondansetron (ZOFRAN) 4 MG tablet Take 1 tablet (4 mg total) by mouth every 8 (eight) hours as needed for nausea or vomiting. 30 tablet 2 PRN at PRN  . promethazine (PHENERGAN) 25 MG tablet Take 1 tablet (25 mg total) by mouth every 6 (six) hours as needed for nausea or vomiting. 60 tablet 3 PRN at PRN  . dexamethasone (DECADRON) 4 MG tablet Take one tablet twice a day. Start 2 days after chemotherapy for 5 days. (Patient not taking: Reported on 05/27/2017) 30 tablet 3 Not Taking at Unknown time    Current medications: Current Facility-Administered Medications  Medication Dose Route Frequency Provider Last Rate Last Dose  . 0.9 %  sodium chloride infusion  250 mL Intravenous PRN Salary, Montell D, MD      . acetaminophen (TYLENOL) tablet 650 mg  650 mg Oral Q6H PRN Salary, Montell D, MD       Or  . acetaminophen (TYLENOL) suppository 650 mg  650 mg Rectal Q6H PRN Salary, Montell D, MD      . azithromycin (ZITHROMAX) 500 mg in sodium chloride 0.9 % 250 mL IVPB  500 mg Intravenous q1800 Salary, Avel Peace, MD   Stopped at 11/23/17  1830  . cefTRIAXone (ROCEPHIN) 1 g in sodium chloride 0.9 % 100 mL IVPB  1 g Intravenous q1800 Salary, Avel Peace, MD   Stopped at 11/23/17 1354  . HYDROcodone-acetaminophen (NORCO/VICODIN) 5-325 MG per tablet 1-2 tablet  1-2 tablet Oral Q6H PRN Gorden Harms, MD   1 tablet at 11/23/17 2006  . iopamidol (ISOVUE-300) 61 % injection 30 mL  30 mL Other Once PRN Greggory Keen, MD      . loratadine (CLARITIN) tablet 10 mg  10 mg Oral Daily PRN Salary, Montell D, MD      . megestrol (MEGACE) 400 MG/10ML suspension 400 mg  400 mg Oral BID Loney Hering D, MD   400 mg at 11/24/17 0803  . morphine 2 MG/ML injection 2 mg  2 mg Intravenous Q2H PRN Salary, Montell D, MD   2 mg at 11/23/17 1330  . ondansetron (ZOFRAN) tablet 4 mg  4 mg Oral Q6H PRN Salary, Montell D, MD       Or  . ondansetron (ZOFRAN) injection 4 mg  4 mg Intravenous Q6H PRN Salary,  Montell D, MD   4 mg at 11/24/17 0524  . patiromer Daryll Drown) packet 8.4 g  8.4 g Oral Daily Salary, Montell D, MD   8.4 g at 11/23/17 1743  . polyethylene glycol (MIRALAX / GLYCOLAX) packet 17 g  17 g Oral Daily PRN Salary, Montell D, MD      . promethazine (PHENERGAN) tablet 25 mg  25 mg Oral Q6H PRN Salary, Montell D, MD      . sodium bicarbonate 150 mEq in dextrose 5 % 1,000 mL infusion   Intravenous Continuous Jazlen Ogarro, MD      . sodium chloride flush (NS) 0.9 % injection 10 mL  10 mL Per Tube Q12H Benicia Bergevin, MD       Facility-Administered Medications Ordered in Other Encounters  Medication Dose Route Frequency Provider Last Rate Last Dose  . denosumab (XGEVA) injection 120 mg  120 mg Subcutaneous Once Wyatt Portela, MD          Allergies: Allergies  Allergen Reactions  . Versed [Midazolam] Other (See Comments)    agitation      Past Medical History: Past Medical History:  Diagnosis Date  . Diverticulosis of colon   . Hx of colonic polyps   . Prostate cancer [facility])      Past Surgical History: Past Surgical History:  Procedure Laterality Date  . CARDIOVASCULAR STRESS TEST  1996   Negative  . IR FLUORO GUIDE PORT INSERTION RIGHT  01/09/2017  . IR NEPHROSTOGRAM LEFT INITIAL PLACEMENT  11/23/2017  . IR US GUIDE VASC ACCESS RIGHT  01/09/2017  . PORTACATH PLACEMENT       Family History: Family History  Problem Relation Age of Onset  . Heart attack Father   . Heart disease Father   . Heart attack Brother 68  . Heart disease Brother   . Heart attack Brother   . Heart disease Brother   . Cancer Sister        breast  . Hypertension Mother   . Diabetes Sister   . Cancer Maternal Aunt   . Cancer Maternal Aunt   . Asthma Son      Social History: Social History   Socioeconomic History  . Marital status: Married    Spouse name: Not on file  . Number of children: 3  . Years of education: Not on file  . Highest education level: Not  on file   Occupational History  . Occupation: cancer registry reporting  Social Needs  . Financial resource strain: Not on file  . Food insecurity:    Worry: Not on file    Inability: Not on file  . Transportation needs:    Medical: Not on file    Non-medical: Not on file  Tobacco Use  . Smoking status: Former Smoker    Packs/day: 1.00    Years: 10.00    Pack years: 10.00    Types: Cigarettes    Last attempt to quit: 03/26/1990    Years since quitting: 27.6  . Smokeless tobacco: Never Used  Substance and Sexual Activity  . Alcohol use: No    Frequency: Never  . Drug use: No  . Sexual activity: Never  Lifestyle  . Physical activity:    Days per week: Not on file    Minutes per session: Not on file  . Stress: Not on file  Relationships  . Social connections:    Talks on phone: Not on file    Gets together: Not on file    Attends religious service: Not on file    Active member of club or organization: Not on file    Attends meetings of clubs or organizations: Not on file    Relationship status: Not on file  . Intimate partner violence:    Fear of current or ex partner: Not on file    Emotionally abused: Not on file    Physically abused: Not on file    Forced sexual activity: Not on file  Other Topics Concern  . Not on file  Social History Narrative   Has living will   Wife is health care POA.   Would accept resuscitation    No tube feeds if cognitively unaware.     Review of Systems: Review of Systems  Unable to perform ROS: Mental status change    Vital Signs: Blood pressure 126/80, pulse 86, temperature 98.3 F (36.8 C), temperature source Oral, resp. rate 19, height 6' (1.829 m), weight 79.8 kg, SpO2 98 %.  Weight trends: Filed Weights   11/23/17 0840 11/23/17 1348  Weight: 84.3 kg 79.8 kg    Physical Exam: General: NAD,   Head: Normocephalic, atraumatic. Moist oral mucosal membranes  Eyes: Anicteric, PERRL  Neck: Supple, trachea midline  Lungs:  Clear to  auscultation  Heart: Regular rate and rhythm  Abdomen:  Soft, nontender,   Extremities:  no peripheral edema.  Neurologic: Alert to self only  Skin: No lesions   GU Bilateral nephrostomy tubes     Lab results: Basic Metabolic Panel: Recent Labs  Lab 11/19/17 1228 11/23/17 0928 11/24/17 0522  NA 144 140 144  K 4.7 5.9* 4.9  CL 115* 113* 115*  CO2 18* 14* 16*  GLUCOSE 88 92 119*  BUN 50* 100* 82*  CREATININE 4.53* 10.82* 7.31*  CALCIUM 9.9 9.7 9.5    Liver Function Tests: Recent Labs  Lab 11/19/17 1228  AST 11*  ALT 6  ALKPHOS 75  BILITOT 0.3  PROT 7.0  ALBUMIN 3.8   No results for input(s): LIPASE, AMYLASE in the last 168 hours. No results for input(s): AMMONIA in the last 168 hours.  CBC: Recent Labs  Lab 11/23/17 0928  WBC 5.3  NEUTROABS 4.0  HGB 8.7*  HCT 25.5*  MCV 89.2  PLT 236    Cardiac Enzymes: No results for input(s): CKTOTAL, CKMB, CKMBINDEX, TROPONINI in the last 168 hours.  BNP:  Invalid input(s): POCBNP  CBG: No results for input(s): GLUCAP in the last 168 hours.  Microbiology: Results for orders placed or performed during the hospital encounter of 11/23/17  CULTURE, BLOOD (ROUTINE X 2) w Reflex to ID Panel     Status: None (Preliminary result)   Collection Time: 11/23/17  3:50 PM  Result Value Ref Range Status   Specimen Description BLOOD L HAND  Final   Special Requests   Final    BOTTLES DRAWN AEROBIC AND ANAEROBIC Blood Culture results may not be optimal due to an inadequate volume of blood received in culture bottles   Culture   Final    NO GROWTH < 24 HOURS Performed at [facility], [address], [place], [place] [address]    Report Status PENDING  Incomplete  CULTURE, BLOOD (ROUTINE X 2) w Reflex to ID Panel     Status: None (Preliminary result)   Collection Time: 11/23/17  3:50 PM  Result Value Ref Range Status   Specimen Description BLOOD R HAND  Final   Special Requests   Final    BOTTLES DRAWN AEROBIC  AND ANAEROBIC Blood Culture results may not be optimal due to an inadequate volume of blood received in culture bottles   Culture   Final    NO GROWTH < 24 HOURS Performed at [facility], [address] [place], [place] [address]    Report Status PENDING  Incomplete    Coagulation Studies: Recent Labs    11/23/17 1138  LABPROT 14.4  INR 1.13    Urinalysis: Recent Labs    11/23/17 1625  COLORURINE RED*  LABSPEC 1.011  PHURINE 5.0  GLUCOSEU NEGATIVE  HGBUR LARGE*  BILIRUBINUR NEGATIVE  KETONESUR NEGATIVE  PROTEINUR 30*  NITRITE NEGATIVE  LEUKOCYTESUR NEGATIVE      Imaging: Dg Chest 2 View  Result Date: 11/23/2017 CLINICAL DATA:  72 year old male with fever, confusion, possible volume overload. EXAM: CHEST - 2 VIEW COMPARISON:  Prior chest x-ray 06/23/2017 FINDINGS: Right IJ approach single-lumen power injectable port catheter. The catheter tip overlies the superior cavoatrial junction. Cardiac and mediastinal contours remain within normal limits. Atherosclerotic calcifications present in the transverse aorta. Slightly increased interstitial prominence diffusely with small Kerley B-lines in the periphery consistent with minimal fluid overload. No overt pulmonary edema. Mild vascular congestion. No focal airspace opacity. Patchy airspace opacity in the posterior left lower lobe. IMPRESSION: 1. Patchy left lower lobe airspace opacity which may reflect bronchopneumonia in the appropriate clinical setting. 2. Slightly increased interstitial prominence consistent with very mild interstitial edema/volume overload. 3. Stable position of right IJ single-lumen power injectable port catheter with the catheter tip overlying the superior cavoatrial junction. 4.  Aortic Atherosclerosis (ICD10-170.0) Electronically Signed   By: Jacqulynn Cadet M.D.   On: 11/23/2017 10:22   Ct Renal Stone Study  Result Date: 11/23/2017 CLINICAL DATA:  Chronic, yet, increasing pelvic and right  flank pain due to prostate cancer and bilateral hydronephrosis. 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 Some confusion also noted today. EXAM: CT ABDOMEN AND PELVIS WITHOUT CONTRAST TECHNIQUE: Multidetector CT imaging of the abdomen and pelvis was performed following the standard protocol without IV contrast. COMPARISON:  11/01/2017 FINDINGS: Lower chest: The heart is normal in size. Trace pleural effusions. Lung base interstitial thickening. Mild dependent subsegmental atelectasis. Hepatobiliary: Liver normal in size. Multiple small low-density liver lesions are noted consistent with cysts, stable from the prior CT. No new liver abnormalities. Small  dependent gallstones. No evidence of acute cholecystitis. No bile duct dilation. Pancreas: Unremarkable. No pancreatic ductal dilatation or surrounding inflammatory changes. Spleen: Normal in size without focal abnormality. Adrenals/Urinary Tract: No adrenal masses. Marked right and moderate left hydronephrosis. There is significant right renal atrophy. No renal masses. No intrarenal stones. Both ureters are dilated, moderate to marked on the right and moderate on the left. These findings are due to an irregular bladder mass, which is contiguous with the prostate. Mass involves the posterior bladder wall, right greater than left. No ureteral stones. There is stranding fat adjacent to the bladder, particularly posteriorly between the prostate, bladder and seminal vesicles. Right seminal vesicle is mildly enlarged and nodular. These changes similar to the prior CT. Stomach/Bowel: Stomach is within normal limits. Appendix appears normal. No evidence of bowel wall thickening, distention, or inflammatory changes. Vascular/Lymphatic: There are several prominent pelvic lymph nodes, largest to the left of the left common iliac artery measuring 6 mm in short axis. These are stable from the prior CT. Aortic atherosclerotic  calcifications.  No aneurysm. Reproductive: Mildly enlarged heterogeneous prostate extends into the bladder, without significant change from the prior CT. Other: Subcutaneous edema as increased when compared the prior CT, noted along the pelvis and upper thighs, right greater than left. No ascites. Small left inguinal hernia contains fat and fluid. 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 There are compression fractures of T9, T11 and L1. The skeletal appearance is stable from the prior CT. IMPRESSION: 1. Marked right and moderate left hydronephrosis. Bilateral hydro ureters, right greater than left. The bilateral obstructive uropathy is due to locally invasive prostate carcinoma invading the bladder and involving the ureterovesicular junctions. Mildly prominent, but not pathologically enlarged, pelvic lymph nodes, suspicious for metastatic disease. The appearance is stable from the recent prior CT. 2. Trace pleural effusions with lung base interstitial thickening suggests mild interstitial edema. There is also subcutaneous edema in the lower abdomen and upper thighs, greater on the right. These findings are new since the prior CT. 3. Bony metastatic disease and multiple compression fractures are stable from the recent CT. 4. No acute findings in the abdomen or pelvis. 5. Aortic atherosclerosis. 6. Gallstones. Electronically Signed   By: Lajean Manes M.D.   On: 11/23/2017 11:30   Ir Nephrostogram Left Initial Placement  Result Date: 11/23/2017 INDICATION: Acute renal failure, invasive prostate cancer with bilateral obstructive hydronephrosis EXAM: ULTRASOUND FLUOROSCOPIC BILATERAL 10 FRENCH NEPHROSTOMY COMPARISON:  11/23/2017 MEDICATIONS: PATIENT IS ALREADY RECEIVING IV ANTIBIOTICS AS AN INPATIENT ANESTHESIA/SEDATION: Fentanyl 50 mcg IV; Versed 4.0 mg IV Moderate Sedation Time:  20 minutes The patient was continuously monitored during the procedure by the interventional radiology  nurse under my direct supervision. CONTRAST:  20 cc-administered into the collecting system(s) FLUOROSCOPY TIME:  Fluoroscopy Time: 1 minutes 48 seconds (15 mGy). COMPLICATIONS: None immediate. PROCEDURE: Informed written consent was obtained from the patient after a thorough discussion of the procedural risks, benefits and alternatives. All questions were addressed. Maximal Sterile Barrier Technique was utilized including caps, mask, sterile gowns, sterile gloves, sterile drape, hand hygiene and skin antiseptic. A timeout was performed prior to the initiation of the procedure. Previous imaging reviewed.  Patient positioned prone. Left nephrostomy placement: Under sterile conditions and local anesthesia, ultrasound percutaneous needle access performed of a dilated midpole calyx with an 18 gauge 15 cm needle. There was return of urine. Amplatz guidewire inserted followed by tract dilatation to insert a 10 [place] drain. Retention loop formed in the renal  pelvis. Contrast injection confirms position. Images obtained for documentation. Gravity drainage bag connected. Catheter secured with Prolene suture. No immediate complication. Right nephrostomy placement: Also under sterile conditions and local anesthesia, ultrasound percutaneous needle access performed of a dilated right midpole calyx with an 18 gauge needle. There was return of urine. Amplatz guidewire inserted followed by tract dilatation to insert a 10 [place] drain. Retention loop formed the renal pelvis. Contrast injection confirms position. Catheter secured with Prolene suture and connected to gravity drainage bag. Sterile dressing applied. No immediate complication. Patient tolerated the procedure well. IMPRESSION: Successful ultrasound and fluoroscopic bilateral 10 French nephrostomies Electronically Signed   By: Jerilynn Mages.  Shick M.D.   On: 11/23/2017 15:07      Assessment & Plan: [name] is a 72 y.o. white male with prostate cancer who was admitted  to [facility] on 11/23/2017  1. Acute renal failure 2. Obstructive Nephropathy 3. Hyperkalemia 4. 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 Renal function improving status post bilateral nephrostomy tubes placed on 8/31 Nonoliguric urine output - Sodium bicarbonate infusion  LOS: 1 [place] 9/1/201911:46 AM

## 2017-11-24 NOTE — Progress Notes (Signed)
(  [facility]) received an OR for spiritual care for [name], He is  was admitted due to hydronephrosis related to urinary obstruction because Prostate CA. I presented to the room, knocked on the door and introduced myself as the [facility]). He was accompanied with his wife and daughter - in -law. The daughter- in- law exited and I stood next to the bed conversing with both the patient and his wife, pastoral presence ensued. We spoke of his suffering, hid previous delirium and pain which have markedly decreased. We shared of the delights and joys we share spiritually and the goodness of God. The visit closed with offering and acceptance of prayer, concluding with salutations of love and peace bestowed upon [name] and his family.   11/24/17 0700  Clinical Encounter Type  Visited With Patient and family together  Visit Type Initial  Referral From Physician  Consult/Referral To Chaplain  Spiritual Encounters  Spiritual Needs Prayer;Emotional  Stress Factors  Patient Stress Factors Health changes  Family Stress Factors Health changes

## 2017-11-24 NOTE — Progress Notes (Signed)
Initial Nutrition Assessment  DOCUMENTATION CODES:   Severe malnutrition in context of chronic illness  INTERVENTION:   Nepro Shake po BID, each supplement provides 425 kcal and 19 grams protein  MVI daily  Liberalize diet   NUTRITION DIAGNOSIS:   Severe Malnutrition related to cancer and cancer related treatments as evidenced by moderate to severe fat depletions, moderate to severe muscle depletions.  GOAL:   Patient will meet greater than or equal to 90% of their needs   MONITOR:   PO intake, Supplement acceptance, Labs, Weight trends, Skin, I & O's  REASON FOR ASSESSMENT:   Malnutrition Screening Tool    ASSESSMENT:   72 y.o. male with a known history of metastatic prostate cancer with bladder obstruction on chemo/radiation, presents with altered mental status, fever over the last day, increasing back pain over the last month, pain with urination, wife noticing decreased urine output, increasing lower extremity swelling, shortness of breath worse with ambulation. Pt found to have acute renal failure with bilateral hydronephrosis now s/p nephrostomy tube placement. Pt also with chest x-ray noted for edema/left pneumonia could not be excluded   Met with pt in room today. Pt reports intermittent poor appetite pta r/t cancer treatments. Pt reports that at times he eats well and at other times his appetite will be poor. Pt reports his appetite is good while on the Megace. Pt reports his appetite is good today; pt ate 100% of his breakfast. Pt reports that he has not been drinking any supplements at home but he is willing to try mixed berry Nepro here. RD recommend continue supplements at home. Per chart, pt appears fairly wt stable pta; pt denies any recent wt loss. RD will add supplements and liberalize pt's diet to try and increase protein intake.   Medications reviewed and include: megace, veltassa, azithromycin, ceftriaxone, Na bicarbonate   Labs reviewed: K 4.9 wnl, Cl  115(H), BUN 82(H), creat 7.31(H) Hgb 8.7(L), Hct 25.5(L)  NUTRITION - FOCUSED PHYSICAL EXAM:    Most Recent Value  Orbital Region  Mild depletion  Upper Arm Region  Severe depletion  Thoracic and Lumbar Region  Moderate depletion  Buccal Region  Mild depletion  Temple Region  Moderate depletion  Clavicle Bone Region  Moderate depletion  Clavicle and Acromion Bone Region  Moderate depletion  Scapular Bone Region  Moderate depletion  Dorsal Hand  Moderate depletion  Patellar Region  Moderate depletion  Anterior Thigh Region  Moderate depletion  Posterior Calf Region  Severe depletion  Edema (RD Assessment)  Mild  Hair  Reviewed  Eyes  Reviewed  Mouth  Reviewed  Skin  Reviewed  Nails  Reviewed     Diet Order:   Diet Order            Diet Heart Room service appropriate? Yes; Fluid consistency: Thin  Diet effective now             EDUCATION NEEDS:   Education needs have been addressed  Skin:  Skin Assessment: Reviewed RN Assessment  Last BM:  8/30  Height:   Ht Readings from Last 1 Encounters:  11/23/17 6' (1.829 m)    Weight:   Wt Readings from Last 1 Encounters:  11/23/17 79.8 kg    Ideal Body Weight:  80.9 kg  BMI:  Body mass index is 23.87 kg/m.  Estimated Nutritional Needs:   Kcal:  2200-2500kcal/day   Protein:  120-136g/day   Fluid:  >2.2L/day   Koleen Distance MS, RD, LDN Pager #-  [phone] Office#- [phone] After Hours Pager: [phone]

## 2017-11-24 NOTE — Progress Notes (Addendum)
[facility] [place] NAME: [name]    MR#:  [id number]  DATE OF BIRTH:  January 03, 1946  SUBJECTIVE:  CHIEF COMPLAINT:   Chief Complaint  Patient presents with  . Flank Pain  . Back Pain  . Altered Mental Status   Patient feels better.  Status post bilateral nephrostomy tube placement. REVIEW OF SYSTEMS:  Review of Systems  Constitutional: Negative for chills, fever and malaise/fatigue.  HENT: Negative for sore throat.   Eyes: Negative for blurred vision and double vision.  Respiratory: Negative for cough, hemoptysis, shortness of breath, wheezing and stridor.   Cardiovascular: Negative for chest pain, palpitations, orthopnea and leg swelling.  Gastrointestinal: Negative for abdominal pain, blood in stool, diarrhea, melena, nausea and vomiting.  Genitourinary: Negative for dysuria, flank pain and hematuria.  Musculoskeletal: Negative for back pain and joint pain.  Skin: Negative for rash.  Neurological: Negative for dizziness, sensory change, focal weakness, seizures, loss of consciousness, weakness and headaches.  Endo/Heme/Allergies: Negative for polydipsia.  Psychiatric/Behavioral: Negative for depression. The patient is not nervous/anxious.     DRUG ALLERGIES:   Allergies  Allergen Reactions  . Versed [Midazolam] Other (See Comments)    agitation   VITALS:  Blood pressure 126/80, pulse 86, temperature 98.3 F (36.8 C), temperature source Oral, resp. rate 19, height 6' (1.829 m), weight 79.8 kg, SpO2 98 %. PHYSICAL EXAMINATION:  Physical Exam  Constitutional: He is oriented to person, place, and time. He appears well-developed.  HENT:  Head: Normocephalic.  Mouth/Throat: Oropharynx is clear and moist.  Eyes: Pupils are equal, round, and reactive to light. Conjunctivae and EOM are normal. No scleral icterus.  Neck: Normal range of motion. Neck supple. No JVD present. No tracheal deviation present.  Cardiovascular: Normal  rate, regular rhythm and normal heart sounds. Exam reveals no gallop.  No murmur heard. Pulmonary/Chest: Effort normal and breath sounds normal. No respiratory distress. He has no wheezes. He has no rales.  Abdominal: Soft. Bowel sounds are normal. He exhibits no distension. There is no tenderness. There is no rebound.  Musculoskeletal: Normal range of motion. He exhibits no edema or tenderness.  Neurological: He is alert and oriented to person, place, and time. No cranial nerve deficit.  Skin: No rash noted. No erythema.  Psychiatric: He has a normal mood and affect.   LABORATORY PANEL:  Male CBC Recent Labs  Lab 11/23/17 0928  WBC 5.3  HGB 8.7*  HCT 25.5*  PLT 236   ------------------------------------------------------------------------------------------------------------------ Chemistries  Recent Labs  Lab 11/19/17 1228  11/24/17 0522  NA 144   < > 144  K 4.7   < > 4.9  CL 115*   < > 115*  CO2 18*   < > 16*  GLUCOSE 88   < > 119*  BUN 50*   < > 82*  CREATININE 4.53*   < > 7.31*  CALCIUM 9.9   < > 9.5  AST 11*  --   --   ALT 6  --   --   ALKPHOS 75  --   --   BILITOT 0.3  --   --    < > = values in this interval not displayed.   RADIOLOGY:  Ir Nephrostogram Left Initial Placement  Result Date: 11/23/2017 INDICATION: Acute renal failure, invasive prostate cancer with bilateral obstructive hydronephrosis EXAM: ULTRASOUND FLUOROSCOPIC BILATERAL 10 FRENCH NEPHROSTOMY COMPARISON:  11/23/2017 MEDICATIONS: PATIENT IS ALREADY RECEIVING IV ANTIBIOTICS AS AN INPATIENT ANESTHESIA/SEDATION: Fentanyl  50 mcg IV; Versed 4.0 mg IV Moderate Sedation Time:  20 minutes The patient was continuously monitored during the procedure by the interventional radiology nurse under my direct supervision. CONTRAST:  20 cc-administered into the collecting system(s) FLUOROSCOPY TIME:  Fluoroscopy Time: 1 minutes 48 seconds (15 mGy). COMPLICATIONS: None immediate. PROCEDURE: Informed written consent was  obtained from the patient after a thorough discussion of the procedural risks, benefits and alternatives. All questions were addressed. Maximal Sterile Barrier Technique was utilized including caps, mask, sterile gowns, sterile gloves, sterile drape, hand hygiene and skin antiseptic. A timeout was performed prior to the initiation of the procedure. Previous imaging reviewed.  Patient positioned prone. Left nephrostomy placement: Under sterile conditions and local anesthesia, ultrasound percutaneous needle access performed of a dilated midpole calyx with an 18 gauge 15 cm needle. There was return of urine. Amplatz guidewire inserted followed by tract dilatation to insert a 10 [place] drain. Retention loop formed in the renal pelvis. Contrast injection confirms position. Images obtained for documentation. Gravity drainage bag connected. Catheter secured with Prolene suture. No immediate complication. Right nephrostomy placement: Also under sterile conditions and local anesthesia, ultrasound percutaneous needle access performed of a dilated right midpole calyx with an 18 gauge needle. There was return of urine. Amplatz guidewire inserted followed by tract dilatation to insert a 10 [place] drain. Retention loop formed the renal pelvis. Contrast injection confirms position. Catheter secured with Prolene suture and connected to gravity drainage bag. Sterile dressing applied. No immediate complication. Patient tolerated the procedure well. IMPRESSION: Successful ultrasound and fluoroscopic bilateral 10 French nephrostomies Electronically Signed   By: Jerilynn Mages.  Shick M.D.   On: 11/23/2017 15:07   ASSESSMENT AND PLAN:   *Acute renal failure due to hydronephrosis secondary to prostate cancer/mass Status post bilateral nephrostomy tube placement. Renal function is improving, creatinine decreased to 7.3.  Follow-up with BMP.  *Acute encephalopathy secondary to uremia, improved. *Acute hyperkalemia.  Improved. *Acute pulmonary  edema due to ARF, I doubt community-acquired pneumonia.  Continue Zithromax and Rocephin for now.  *Chronic worsening metastatic prostate cancer-on chemo/radiation Anemia of chronic disease.  Stable.  Discussed with Dr. Juleen China. All the records are reviewed and case discussed with Care Management/Social Worker. Management plans discussed with the patient, his wife and they are in agreement.  CODE STATUS: Full Code  TOTAL TIME TAKING CARE OF THIS PATIENT: 36 minutes.   More than 50% of the time was spent in counseling/coordination of care: YES  POSSIBLE D/C IN 2 DAYS, DEPENDING ON CLINICAL CONDITION.   Demetrios Loll M.D on 11/24/2017 at 12:35 PM  Between 7am to 6pm - Pager - [phone]  After 6pm go to [email or web address] - [facility]

## 2017-11-25 DIAGNOSIS — R000 Tachycardia, unspecified: Secondary | ICD-10-CM

## 2017-11-25 LAB — MAGNESIUM: Magnesium: 1.6 mg/dL — ABNORMAL LOW (ref 1.7–2.4)

## 2017-11-25 LAB — CBC
HEMATOCRIT: 23.2 % — AB (ref 40.0–52.0)
Hemoglobin: 7.9 g/dL — ABNORMAL LOW (ref 13.0–18.0)
MCH: 30.4 pg (ref 26.0–34.0)
MCHC: 34.2 g/dL (ref 32.0–36.0)
MCV: 88.9 fL (ref 80.0–100.0)
PLATELETS: 218 10*3/uL (ref 150–440)
RBC: 2.61 MIL/uL — ABNORMAL LOW (ref 4.40–5.90)
RDW: 14.7 % — AB (ref 11.5–14.5)
WBC: 5.8 10*3/uL (ref 3.8–10.6)

## 2017-11-25 LAB — BASIC METABOLIC PANEL
Anion gap: 10 (ref 5–15)
BUN: 59 mg/dL — AB (ref 8–23)
CO2: 20 mmol/L — ABNORMAL LOW (ref 22–32)
CREATININE: 4.1 mg/dL — AB (ref 0.61–1.24)
Calcium: 9 mg/dL (ref 8.9–10.3)
Chloride: 115 mmol/L — ABNORMAL HIGH (ref 98–111)
GFR calc Af Amer: 16 mL/min — ABNORMAL LOW (ref >60)
GFR calc non Af Amer: 13 mL/min — ABNORMAL LOW (ref >60)
GLUCOSE: 133 mg/dL — AB (ref 70–99)
Potassium: 3.8 mmol/L (ref 3.5–5.1)
SODIUM: 145 mmol/L (ref 135–145)

## 2017-11-25 LAB — PHOSPHORUS: Phosphorus: 4.1 mg/dL (ref 2.5–4.6)

## 2017-11-25 MED ORDER — MAGNESIUM SULFATE 2 GM/50ML IV SOLN
2.0000 g | Freq: Once | INTRAVENOUS | Status: AC
  Administered 2017-11-25: 2 g via INTRAVENOUS
  Filled 2017-11-25: qty 50

## 2017-11-25 MED ORDER — HALOPERIDOL LACTATE 5 MG/ML IJ SOLN: 5.0000 mg | Freq: Once | INTRAMUSCULAR | Status: DC

## 2017-11-25 MED ORDER — LORAZEPAM 2 MG/ML IJ SOLN
2.0000 mg | Freq: Once | INTRAMUSCULAR | Status: AC | PRN
  Administered 2017-11-25: 2 mg via INTRAVENOUS
  Filled 2017-11-25: qty 1

## 2017-11-25 MED ORDER — OLANZAPINE 10 MG IM SOLR
5.0000 mg | Freq: Once | INTRAMUSCULAR | Status: AC | PRN
  Administered 2017-11-25: 5 mg via INTRAMUSCULAR
  Filled 2017-11-25: qty 10

## 2017-11-25 MED ORDER — HALOPERIDOL LACTATE 5 MG/ML IJ SOLN
5.0000 mg | Freq: Once | INTRAMUSCULAR | Status: AC
  Administered 2017-11-25: 03:00:00 5 mg via INTRAVENOUS

## 2017-11-25 MED ORDER — QUETIAPINE FUMARATE 25 MG PO TABS
25.0000 mg | ORAL_TABLET | Freq: Once | ORAL | Status: AC
  Administered 2017-11-25: 25 mg via ORAL
  Filled 2017-11-25: qty 1

## 2017-11-25 MED ORDER — HALOPERIDOL LACTATE 5 MG/ML IJ SOLN
5.0000 mg | Freq: Once | INTRAMUSCULAR | Status: AC
  Administered 2017-11-28: 5 mg via INTRAVENOUS
  Filled 2017-11-25 (×2): qty 1

## 2017-11-25 MED ORDER — SODIUM BICARBONATE 8.4 % IV SOLN
INTRAVENOUS | Status: DC
  Administered 2017-11-25 – 2017-11-26 (×2): via INTRAVENOUS
  Filled 2017-11-25 (×2): qty 150

## 2017-11-25 MED ORDER — SODIUM CHLORIDE 0.9% FLUSH: 10.0000 mL | INTRAVENOUS | Status: DC | PRN

## 2017-11-25 MED ORDER — HALOPERIDOL LACTATE 5 MG/ML IJ SOLN
1.0000 mg | Freq: Four times a day (QID) | INTRAMUSCULAR | Status: DC | PRN
  Administered 2017-11-25: 14:00:00 5 mg via INTRAVENOUS
  Administered 2017-11-25: 1 mg via INTRAVENOUS
  Filled 2017-11-25 (×2): qty 1

## 2017-11-25 NOTE — Plan of Care (Signed)
  Problem: Education: Goal: Knowledge of General Education information will improve Description Including pain rating scale, medication(s)/side effects and non-pharmacologic comfort measures Outcome: Not Progressing   Problem: Health Behavior/Discharge Planning: Goal: Ability to manage health-related needs will improve Outcome: Not Progressing   Problem: Clinical Measurements: Goal: Ability to maintain clinical measurements within normal limits will improve Outcome: Not Progressing   Problem: Activity: Goal: Risk for activity intolerance will decrease Outcome: Not Progressing   Problem: Coping: Goal: Level of anxiety will decrease Outcome: Not Progressing  1:1 safety sitter remains at bedside this shift

## 2017-11-25 NOTE — Progress Notes (Signed)
[facility]  ROUNDING NOTE   Subjective:   Confused. Sitter at bedside.   UOP 3560  Sodium bicarb gtt 49mL/hr  Objective:  Vital signs in last 24 hours:  Temp:  [98 F (36.7 C)-98.3 F (36.8 C)] 98 F (36.7 C) (09/02 0457) Pulse Rate:  [83-117] 117 (09/02 0457) Resp:  [18] 18 (09/02 0457) BP: (115-130)/(75-85) 117/80 (09/02 0457) SpO2:  [96 %-98 %] 97 % (09/02 0457)  Weight change:  Filed Weights   11/23/17 0840 11/23/17 1348  Weight: 84.3 kg 79.8 kg    Intake/Output: I/O last 3 completed shifts: In: 3292.6 [P.O.:1277; I.V.:525.6; Other:40; IV Piggyback:1450] Out: 0539 [Urine:6565]   Intake/Output this shift:  Total I/O In: 247.5 [I.V.:182.5; Other:20; IV Piggyback:45] Out: 850 [Urine:850]  Physical Exam: General: NAD, laying in bed  Head: Normocephalic, atraumatic. Moist oral mucosal membranes  Eyes: Anicteric, PERRL  Neck: Supple, trachea midline  Lungs:  Clear to auscultation  Heart: Regular rate and rhythm  Abdomen:  Soft, nontender,   Extremities: no peripheral edema.  Neurologic: confused  Skin: No lesions   GU Bilateral nephrostomy tubes    Basic Metabolic Panel: Recent Labs  Lab 11/19/17 1228 11/23/17 0928 11/24/17 0522 11/25/17 0400  NA 144 140 144 145  K 4.7 5.9* 4.9 3.8  CL 115* 113* 115* 115*  CO2 18* 14* 16* 20*  GLUCOSE 88 92 119* 133*  BUN 50* 100* 82* 59*  CREATININE 4.53* 10.82* 7.31* 4.10*  CALCIUM 9.9 9.7 9.5 9.0  MG  --   --   --  1.6*  PHOS  --   --   --  4.1    Liver Function Tests: Recent Labs  Lab 11/19/17 1228  AST 11*  ALT 6  ALKPHOS 75  BILITOT 0.3  PROT 7.0  ALBUMIN 3.8   No results for input(s): LIPASE, AMYLASE in the last 168 hours. No results for input(s): AMMONIA in the last 168 hours.  CBC: Recent Labs  Lab 11/23/17 0928 11/25/17 0400  WBC 5.3 5.8  NEUTROABS 4.0  --   HGB 8.7* 7.9*  HCT 25.5* 23.2*  MCV 89.2 88.9  PLT 236 218    Cardiac Enzymes: No results for input(s):  CKTOTAL, CKMB, CKMBINDEX, TROPONINI in the last 168 hours.  BNP: Invalid input(s): POCBNP  CBG: No results for input(s): GLUCAP in the last 168 hours.  Microbiology: Results for orders placed or performed during the hospital encounter of 11/23/17  CULTURE, BLOOD (ROUTINE X 2) w Reflex to ID Panel     Status: None (Preliminary result)   Collection Time: 11/23/17  3:50 PM  Result Value Ref Range Status   Specimen Description BLOOD L HAND  Final   Special Requests   Final    BOTTLES DRAWN AEROBIC AND ANAEROBIC Blood Culture results may not be optimal due to an inadequate volume of blood received in culture bottles   Culture   Final    NO GROWTH 2 DAYS Performed at [facility], [address], [place], [place] [address]    Report Status PENDING  Incomplete  CULTURE, BLOOD (ROUTINE X 2) w Reflex to ID Panel     Status: None (Preliminary result)   Collection Time: 11/23/17  3:50 PM  Result Value Ref Range Status   Specimen Description BLOOD R HAND  Final   Special Requests   Final    BOTTLES DRAWN AEROBIC AND ANAEROBIC Blood Culture results may not be optimal due to an inadequate volume of blood received  in culture bottles   Culture   Final    NO GROWTH 2 DAYS Performed at [facility], [address] [place], [place] [address]    Report Status PENDING  Incomplete    Coagulation Studies: Recent Labs    11/23/17 1138  LABPROT 14.4  INR 1.13    Urinalysis: Recent Labs    11/23/17 1625  COLORURINE RED*  LABSPEC 1.011  PHURINE 5.0  GLUCOSEU NEGATIVE  HGBUR LARGE*  BILIRUBINUR NEGATIVE  KETONESUR NEGATIVE  PROTEINUR 30*  NITRITE NEGATIVE  LEUKOCYTESUR NEGATIVE      Imaging: Ct Renal Stone Study  Result Date: 11/23/2017 CLINICAL DATA:  Chronic, yet, increasing pelvic and right flank pain due to prostate cancer and bilateral hydronephrosis. 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 Some confusion  also noted today. EXAM: CT ABDOMEN AND PELVIS WITHOUT CONTRAST TECHNIQUE: Multidetector CT imaging of the abdomen and pelvis was performed following the standard protocol without IV contrast. COMPARISON:  11/01/2017 FINDINGS: Lower chest: The heart is normal in size. Trace pleural effusions. Lung base interstitial thickening. Mild dependent subsegmental atelectasis. Hepatobiliary: Liver normal in size. Multiple small low-density liver lesions are noted consistent with cysts, stable from the prior CT. No new liver abnormalities. Small dependent gallstones. No evidence of acute cholecystitis. No bile duct dilation. Pancreas: Unremarkable. No pancreatic ductal dilatation or surrounding inflammatory changes. Spleen: Normal in size without focal abnormality. Adrenals/Urinary Tract: No adrenal masses. Marked right and moderate left hydronephrosis. There is significant right renal atrophy. No renal masses. No intrarenal stones. Both ureters are dilated, moderate to marked on the right and moderate on the left. These findings are due to an irregular bladder mass, which is contiguous with the prostate. Mass involves the posterior bladder wall, right greater than left. No ureteral stones. There is stranding fat adjacent to the bladder, particularly posteriorly between the prostate, bladder and seminal vesicles. Right seminal vesicle is mildly enlarged and nodular. These changes similar to the prior CT. Stomach/Bowel: Stomach is within normal limits. Appendix appears normal. No evidence of bowel wall thickening, distention, or inflammatory changes. Vascular/Lymphatic: There are several prominent pelvic lymph nodes, largest to the left of the left common iliac artery measuring 6 mm in short axis. These are stable from the prior CT. Aortic atherosclerotic calcifications.  No aneurysm. Reproductive: Mildly enlarged heterogeneous prostate extends into the bladder, without significant change from the prior CT. Other: Subcutaneous  edema as increased when compared the prior CT, noted along the pelvis and upper thighs, right greater than left. No ascites. Small left inguinal hernia contains fat and fluid. 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 There are compression fractures of T9, T11 and L1. The skeletal appearance is stable from the prior CT. IMPRESSION: 1. Marked right and moderate left hydronephrosis. Bilateral hydro ureters, right greater than left. The bilateral obstructive uropathy is due to locally invasive prostate carcinoma invading the bladder and involving the ureterovesicular junctions. Mildly prominent, but not pathologically enlarged, pelvic lymph nodes, suspicious for metastatic disease. The appearance is stable from the recent prior CT. 2. Trace pleural effusions with lung base interstitial thickening suggests mild interstitial edema. There is also subcutaneous edema in the lower abdomen and upper thighs, greater on the right. These findings are new since the prior CT. 3. Bony metastatic disease and multiple compression fractures are stable from the recent CT. 4. No acute findings in the abdomen or pelvis. 5. Aortic atherosclerosis. 6. Gallstones. Electronically Signed  By: Lajean Manes M.D.   On: 11/23/2017 11:30   Ir Nephrostogram Left Initial Placement  Result Date: 11/23/2017 INDICATION: Acute renal failure, invasive prostate cancer with bilateral obstructive hydronephrosis EXAM: ULTRASOUND FLUOROSCOPIC BILATERAL 10 FRENCH NEPHROSTOMY COMPARISON:  11/23/2017 MEDICATIONS: PATIENT IS ALREADY RECEIVING IV ANTIBIOTICS AS AN INPATIENT ANESTHESIA/SEDATION: Fentanyl 50 mcg IV; Versed 4.0 mg IV Moderate Sedation Time:  20 minutes The patient was continuously monitored during the procedure by the interventional radiology nurse under my direct supervision. CONTRAST:  20 cc-administered into the collecting system(s) FLUOROSCOPY TIME:  Fluoroscopy Time: 1 minutes 48 seconds (15 mGy). COMPLICATIONS:  None immediate. PROCEDURE: Informed written consent was obtained from the patient after a thorough discussion of the procedural risks, benefits and alternatives. All questions were addressed. Maximal Sterile Barrier Technique was utilized including caps, mask, sterile gowns, sterile gloves, sterile drape, hand hygiene and skin antiseptic. A timeout was performed prior to the initiation of the procedure. Previous imaging reviewed.  Patient positioned prone. Left nephrostomy placement: Under sterile conditions and local anesthesia, ultrasound percutaneous needle access performed of a dilated midpole calyx with an 18 gauge 15 cm needle. There was return of urine. Amplatz guidewire inserted followed by tract dilatation to insert a 10 [place] drain. Retention loop formed in the renal pelvis. Contrast injection confirms position. Images obtained for documentation. Gravity drainage bag connected. Catheter secured with Prolene suture. No immediate complication. Right nephrostomy placement: Also under sterile conditions and local anesthesia, ultrasound percutaneous needle access performed of a dilated right midpole calyx with an 18 gauge needle. There was return of urine. Amplatz guidewire inserted followed by tract dilatation to insert a 10 [place] drain. Retention loop formed the renal pelvis. Contrast injection confirms position. Catheter secured with Prolene suture and connected to gravity drainage bag. Sterile dressing applied. No immediate complication. Patient tolerated the procedure well. IMPRESSION: Successful ultrasound and fluoroscopic bilateral 10 French nephrostomies Electronically Signed   By: Jerilynn Mages.  Shick M.D.   On: 11/23/2017 15:07     Medications:   . sodium chloride    .  sodium bicarbonate  infusion 1000 mL 50 mL/hr at 11/25/17 1000   . feeding supplement (NEPRO CARB STEADY)  237 mL Oral BID BM  . haloperidol lactate  5 mg Intravenous Once  . megestrol  400 mg Oral BID  . multivitamin with minerals   1 tablet Oral Daily  . sodium chloride flush  10 mL Per Tube Q12H   sodium chloride, acetaminophen **OR** acetaminophen, haloperidol lactate, HYDROcodone-acetaminophen, iopamidol, loratadine, morphine injection, ondansetron **OR** ondansetron (ZOFRAN) IV, polyethylene glycol, promethazine  Assessment/ Plan:  [name] is a 72 y.o. white male with prostate cancer who was admitted to [facility] on 11/23/2017. Nephrostomy tubes bilaterally placed by IR on 8/31.   1. Acute renal failure: baseline creatinine of 1.19, GFR >60 on 09/16/17 2. Obstructive Nephropathy 3. Hyperkalemia 4. 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 Renal function improving status post bilateral nephrostomy tubes placed on 8/31. Hyperkalemia improved - discontinued veltassa Nonoliguric urine output - Continue sodium bicarbonate infusion   LOS: 2 Lyndee Herbst 9/2/201910:48 AM

## 2017-11-25 NOTE — Progress Notes (Signed)
Patient notably getting more confuse and anxious, tried to get out of bed multiple times. MD Duane Boston made aware, ordered for Seroquel 25mg  PO x 1 dose. Order carried out, medicine administered at 2218. Patient took a nap for awhile, woke up still confuse, getting out of bed, poor safety awareness. States "I need to get home so I could take my wife to the court". Paged MD on call, ordered to give another dose of Seroquel 25mg  po x1. Medicine administered at [place]. Also placed on safety sitter due to patient pulling out lines (Nephrostomy tubes). Needs attended. Will continue to monitor. Safety sitter at bedside.

## 2017-11-25 NOTE — Progress Notes (Signed)
[name] at [place] NAME: [name]    MR#:  [id number]  DATE OF BIRTH:  May 08, 1945  SUBJECTIVE:  CHIEF COMPLAINT:   Chief Complaint  Patient presents with  . Flank Pain  . Back Pain  . Altered Mental Status   Patient is very confused and agitated since last night. REVIEW OF SYSTEMS:  Review of Systems  Unable to perform ROS: Mental status change    DRUG ALLERGIES:   Allergies  Allergen Reactions  . Versed [Midazolam] Other (See Comments)    agitation   VITALS:  Blood pressure 117/80, pulse (!) 117, temperature 98 F (36.7 C), temperature source Axillary, resp. rate 18, height 6' (1.829 m), weight 79.8 kg, SpO2 97 %. PHYSICAL EXAMINATION:  Physical Exam  Constitutional: He appears well-developed.  HENT:  Head: Normocephalic.  Eyes: No scleral icterus.  Neck: Neck supple. No JVD present. No tracheal deviation present.  Cardiovascular: Regular rhythm and normal heart sounds. Exam reveals no gallop.  No murmur heard. Tachycardia.  Pulmonary/Chest: Effort normal and breath sounds normal. No respiratory distress. He has no wheezes. He has no rales.  Abdominal: Soft. Bowel sounds are normal. He exhibits no distension. There is no tenderness. There is no rebound.  Musculoskeletal: Normal range of motion. He exhibits no edema or tenderness.  Neurological:  Patient is confused and agitated, unable to exam.  Skin: No rash noted. No erythema.   LABORATORY PANEL:  Male CBC Recent Labs  Lab 11/25/17 0400  WBC 5.8  HGB 7.9*  HCT 23.2*  PLT 218   ------------------------------------------------------------------------------------------------------------------ Chemistries  Recent Labs  Lab 11/19/17 1228  11/25/17 0400  NA 144   < > 145  K 4.7   < > 3.8  CL 115*   < > 115*  CO2 18*   < > 20*  GLUCOSE 88   < > 133*  BUN 50*   < > 59*  CREATININE 4.53*   < > 4.10*  CALCIUM 9.9   < > 9.0  MG  --   --  1.6*  AST 11*   --   --   ALT 6  --   --   ALKPHOS 75  --   --   BILITOT 0.3  --   --    < > = values in this interval not displayed.   RADIOLOGY:  No results found. ASSESSMENT AND PLAN:   *Acute renal failure due to hydronephrosis secondary to prostate cancer/mass Status post bilateral nephrostomy tube placement. Renal function is improving, creatinine decreased to 4.1.  Follow-up BMP.  *Acute encephalopathy, delirium. Aspiration the fall precaution.  MRI of the brain to rule out metastasis.  *Acute hyperkalemia.  Improved. *Acute pulmonary edema due to ARF, I doubt community-acquired pneumonia.  Disontinued Zithromax and Rocephin.  *Chronic worsening metastatic prostate cancer, follow-up as outpatient for chemo/radiation Anemia of chronic disease.  Hemoglobin down to 7.9, possible due to nephrostomy.  Follow-up hemoglobin.  Discussed with Dr. Juleen China. All the records are reviewed and case discussed with Care Management/Social Worker. Management plans discussed with the patient, his wife and they are in agreement.  CODE STATUS: Full Code  TOTAL TIME TAKING CARE OF THIS PATIENT: 36 minutes.   More than 50% of the time was spent in counseling/coordination of care: YES  POSSIBLE D/C IN 2 DAYS, DEPENDING ON CLINICAL CONDITION.   [name] M.D on 11/25/2017 at 1:33 PM  Between 7am to 6pm - Pager - [phone]  After 6pm go to [email or web address] - [facility]

## 2017-11-25 NOTE — Progress Notes (Addendum)
At around 0230, patient became agitated and combative towards staff. Code 300 was called. Camera operator and [facility] along with Security at bedside. MD ordered to give Zyprexa 5mg  I.M x1, given at 0240. Still combative despite of Zyprexa, MD ordered for Haldol 5mg  IV, dose given at 0301. Also obtained an order for Ativan 2mg  IV, dose given at [place]. Patient slowly calmed down. MD at bedside to evaluate patient's status. Safety sitter present. Needs attended. Will continue to monitor. Vital signs remained stable.

## 2017-11-25 NOTE — Progress Notes (Signed)
Noted for Sinus Tachycardia on cardiac monitor, ranging from low 120s to high 150s. MD on call notified. Ordered stat EKG and some blood works.  Magnesium level resulted, 1.6. MD ordered for Magnesium sulfate 2gm IV. Mag currently infusing, verified from pharmacy compatibility with ongoing IVF maintenance. Will continue to monitor.

## 2017-11-26 ENCOUNTER — Inpatient Hospital Stay: Payer: Medicare Other

## 2017-11-26 ENCOUNTER — Ambulatory Visit: Payer: Medicare Other

## 2017-11-26 DIAGNOSIS — N19 Unspecified kidney failure: Secondary | ICD-10-CM

## 2017-11-26 DIAGNOSIS — G9341 Metabolic encephalopathy: Secondary | ICD-10-CM

## 2017-11-26 LAB — BASIC METABOLIC PANEL
Anion gap: 11 (ref 5–15)
BUN: 40 mg/dL — AB (ref 8–23)
CHLORIDE: 112 mmol/L — AB (ref 98–111)
CO2: 25 mmol/L (ref 22–32)
CREATININE: 2.39 mg/dL — AB (ref 0.61–1.24)
Calcium: 9 mg/dL (ref 8.9–10.3)
GFR calc Af Amer: 30 mL/min — ABNORMAL LOW (ref >60)
GFR calc non Af Amer: 26 mL/min — ABNORMAL LOW (ref >60)
GLUCOSE: 124 mg/dL — AB (ref 70–99)
POTASSIUM: 3.6 mmol/L (ref 3.5–5.1)
Sodium: 148 mmol/L — ABNORMAL HIGH (ref 135–145)

## 2017-11-26 LAB — HIV ANTIBODY (ROUTINE TESTING W REFLEX): HIV Screen 4th Generation wRfx: NONREACTIVE

## 2017-11-26 MED ORDER — LORAZEPAM 2 MG/ML IJ SOLN
1.0000 mg | Freq: Once | INTRAMUSCULAR | Status: AC
  Administered 2017-11-26: 1 mg via INTRAVENOUS
  Filled 2017-11-26: qty 1

## 2017-11-26 MED ORDER — ZIPRASIDONE MESYLATE 20 MG IM SOLR
20.0000 mg | Freq: Once | INTRAMUSCULAR | Status: AC
  Administered 2017-11-26: 12:00:00 20 mg via INTRAMUSCULAR
  Filled 2017-11-26: qty 20

## 2017-11-26 MED ORDER — DEXTROSE 5 % IV SOLN
INTRAVENOUS | Status: DC
  Administered 2017-11-26 – 2017-11-27 (×2): via INTRAVENOUS

## 2017-11-26 MED ORDER — OLANZAPINE 10 MG IM SOLR
5.0000 mg | Freq: Once | INTRAMUSCULAR | Status: AC
  Administered 2017-11-26: 5 mg via INTRAMUSCULAR
  Filled 2017-11-26: qty 10

## 2017-11-26 NOTE — Consult Note (Addendum)
Reason for Consult: Altered mental status Referring Physician: Gorden Harms, MD  CC: Altered mental stauts  HPI: [name] is an 72 y.o. male with pertinent history of prostate cancer with bladder obstruction on chemo and supposed to start radiation presenting with increasing confusion and behavioral disturbances. Per wife who is at the bedside, she noticed that patient was not acting at baseline on 11/22/2017, he was confused and hallucinating. She also state that he had a fever and complaining of back pain so she has been giving him Advil. Per wife, patient has had some progressive decline in memory over the last few years, he is more forgetful, misplacing things and repeating himself. He was noted to have worsening kidney functions, with creatinine 10.8 wl, potassium 5.9, chest x-ray noted for edema/left pneumonia could not be excluded, hemoglobin 8.7. Patient wife state that he has been taking Hydrocodone and tylenol PM for pain and sleep.  Past Medical History:  Diagnosis Date  . Diverticulosis of colon   . Hx of colonic polyps   . Prostate cancer [facility])     Past Surgical History:  Procedure Laterality Date  . CARDIOVASCULAR STRESS TEST  1996   Negative  . IR FLUORO GUIDE PORT INSERTION RIGHT  01/09/2017  . IR NEPHROSTOGRAM LEFT INITIAL PLACEMENT  11/23/2017  . IR US GUIDE VASC ACCESS RIGHT  01/09/2017  . PORTACATH PLACEMENT      Family History  Problem Relation Age of Onset  . Heart attack Father   . Heart disease Father   . Heart attack Brother 16  . Heart disease Brother   . Heart attack Brother   . Heart disease Brother   . Cancer Sister        breast  . Hypertension Mother   . Diabetes Sister   . Cancer Maternal Aunt   . Cancer Maternal Aunt   . Asthma Son     Social History:  reports that he quit smoking about 27 years ago. His smoking use included cigarettes. He has a 10.00 pack-year smoking history. He has never used smokeless tobacco. He reports that he  does not drink alcohol or use drugs.  Allergies  Allergen Reactions  . Versed [Midazolam] Other (See Comments)    agitation    Medications:  I have reviewed the patient's current medications. Prior to Admission:  Medications Prior to Admission  Medication Sig Dispense Refill Last Dose  . aspirin 81 MG tablet Take 81 mg by mouth every morning.    11/22/2017 at Unknown time  . diphenhydramine-acetaminophen (TYLENOL PM) 25-500 MG TABS tablet Take 2 tablets by mouth at bedtime as needed.   PRN at PRN  . HYDROcodone-acetaminophen (NORCO) 5-325 MG tablet 1/2 to 1 tablet Q 6 hours prn pain 30 tablet 0 PRN at PRN  . lidocaine-prilocaine (EMLA) cream Apply 1 application topically as needed. Apply to portacath on the day of chemotherapy. 30 g 3 PRN at PRN  . loratadine (CLARITIN) 10 MG tablet Take 10 mg by mouth daily as needed for allergies.   PRN at PRN  . megestrol (MEGACE) 40 MG/ML suspension TAKE 10 MLS (400 MG TOTAL) BY MOUTH 2 (TWO) TIMES DAILY. (Patient taking differently: Take 400 mg by mouth 2 (two) times daily. ) 240 mL 0 Past Week at Unknown time  . ondansetron (ZOFRAN) 4 MG tablet Take 1 tablet (4 mg total) by mouth every 8 (eight) hours as needed for nausea or vomiting. 30 tablet 2 PRN at PRN  . promethazine (  PHENERGAN) 25 MG tablet Take 1 tablet (25 mg total) by mouth every 6 (six) hours as needed for nausea or vomiting. 60 tablet 3 PRN at PRN  . dexamethasone (DECADRON) 4 MG tablet Take one tablet twice a day. Start 2 days after chemotherapy for 5 days. (Patient not taking: Reported on 05/27/2017) 30 tablet 3 Not Taking at Unknown time   Scheduled: . feeding supplement (NEPRO CARB STEADY)  237 mL Oral BID BM  . haloperidol lactate  5 mg Intravenous Once  . megestrol  400 mg Oral BID  . multivitamin with minerals  1 tablet Oral Daily  . sodium chloride flush  10 mL Per Tube Q12H    ROS: Unable to obtain due to altered mental status  Physical Exam   Vitals Blood pressure 119/66,  pulse 88, temperature 98.5 F (36.9 C), temperature source Axillary, resp. rate 20, height 6' (1.829 m), weight 79.8 kg, SpO2 100 %.   Neurological Exam . Drowsy and restless . Nonverbal  . I. Olfactory not examined . II: Visual fields were full. Pupils were equal, round and reactive to light and accommodation . III,IV, VI: ptosis not present, extra-ocular motions intact bilaterally . V,VII: UTA . VIII: UTA . IX, X: UTA . XI: UTA . XII: UTA . Tone is normal in all extremities, no abnormal movements seen  . Muscle strength in all extremities seemed normal.  . Deep tendon reflexes were symmetric  . Withdraws to painful stimuli in all extremities  . Gait and station deferred.  Laboratory Studies:   Basic Metabolic Panel: Recent Labs  Lab 11/19/17 1228 11/23/17 0928 11/24/17 0522 11/25/17 0400 11/26/17 0359  NA 144 140 144 145 148*  K 4.7 5.9* 4.9 3.8 3.6  CL 115* 113* 115* 115* 112*  CO2 18* 14* 16* 20* 25  GLUCOSE 88 92 119* 133* 124*  BUN 50* 100* 82* 59* 40*  CREATININE 4.53* 10.82* 7.31* 4.10* 2.39*  CALCIUM 9.9 9.7 9.5 9.0 9.0  MG  --   --   --  1.6*  --   PHOS  --   --   --  4.1  --     Liver Function Tests: Recent Labs  Lab 11/19/17 1228  AST 11*  ALT 6  ALKPHOS 75  BILITOT 0.3  PROT 7.0  ALBUMIN 3.8   No results for input(s): LIPASE, AMYLASE in the last 168 hours. No results for input(s): AMMONIA in the last 168 hours.  CBC: Recent Labs  Lab 11/23/17 0928 11/25/17 0400  WBC 5.3 5.8  NEUTROABS 4.0  --   HGB 8.7* 7.9*  HCT 25.5* 23.2*  MCV 89.2 88.9  PLT 236 218    Cardiac Enzymes: No results for input(s): CKTOTAL, CKMB, CKMBINDEX, TROPONINI in the last 168 hours.  BNP: Invalid input(s): POCBNP  CBG: No results for input(s): GLUCAP in the last 168 hours.  Microbiology: Results for orders placed or performed during the hospital encounter of 11/23/17  CULTURE, BLOOD (ROUTINE X 2) w Reflex to ID Panel     Status: None (Preliminary  result)   Collection Time: 11/23/17  3:50 PM  Result Value Ref Range Status   Specimen Description BLOOD L HAND  Final   Special Requests   Final    BOTTLES DRAWN AEROBIC AND ANAEROBIC Blood Culture results may not be optimal due to an inadequate volume of blood received in culture bottles   Culture   Final    NO GROWTH 3 DAYS Performed at [facility]  Lab, [address], [place], [place] [address]    Report Status PENDING  Incomplete  CULTURE, BLOOD (ROUTINE X 2) w Reflex to ID Panel     Status: None (Preliminary result)   Collection Time: 11/23/17  3:50 PM  Result Value Ref Range Status   Specimen Description BLOOD R HAND  Final   Special Requests   Final    BOTTLES DRAWN AEROBIC AND ANAEROBIC Blood Culture results may not be optimal due to an inadequate volume of blood received in culture bottles   Culture   Final    NO GROWTH 3 DAYS Performed at [facility], [address], [place], [place] [address]    Report Status PENDING  Incomplete    Coagulation Studies: No results for input(s): LABPROT, INR in the last 72 hours.  Urinalysis:  Recent Labs  Lab 11/23/17 1625  COLORURINE RED*  LABSPEC 1.011  PHURINE 5.0  GLUCOSEU NEGATIVE  HGBUR LARGE*  BILIRUBINUR NEGATIVE  KETONESUR NEGATIVE  PROTEINUR 30*  NITRITE NEGATIVE  LEUKOCYTESUR NEGATIVE    Lipid Panel:     Component Value Date/Time   CHOL 186 05/06/2009 0850   TRIG 103.0 05/06/2009 0850   HDL 45.70 05/06/2009 0850   CHOLHDL 4 05/06/2009 0850   VLDL 20.6 05/06/2009 0850   LDLCALC 120 (H) 05/06/2009 0850    HgbA1C: No results found for: HGBA1C  Urine Drug Screen:  No results found for: LABOPIA, COCAINSCRNUR, LABBENZ, AMPHETMU, THCU, LABBARB  Alcohol Level: No results for input(s): ETH in the last 168 hours.  Other results: EKG: normal EKG, normal sinus rhythm, unchanged from previous tracings.  Imaging: No results found.   Patient seen and examined.  Clinical course and management  discussed.  Necessary edits performed.  I agree with the above.  Assessment and plan of care developed and discussed below.     Assessment: Presenting with altered mental status likely metabolic encephalopathy in a patient with acute renal failure due to prostrate cancer, pneumonia and underlying dementia. Although renal function improving, in a patient with underlying cognitive deficits, mental status will lag behind improvement in lab work.  Unlikely that this is metastatic disease to the brain due to low likelihood of prostate mets to CNS. Also have low suspicion for stroke in the absence of focal neurological deficit and acute onset, although will further investigate for the possibility of a shower of emboli which may contribute to this presentation.    Plan 1. Hold off MRI brain without contrast until mentation improves, will attempt tomorrow 2. Recommend avoiding Benzodiazepine due to possible paradoxical worsening of neuropsychiatric symptoms. Agree to continued Geodon use.  This patient was staffed with Dr. Magda Paganini, Doy Mince who personally evaluated patient, reviewed documentation and agreed with assessment and plan of care as above.  Rufina Falco, DNP, FNP-BC Board certified Nurse Practitioner Neurology Department   11/26/2017, 12:27 PM     Alexis Goodell, MD Neurology [phone]  11/26/2017  3:45 PM

## 2017-11-26 NOTE — Progress Notes (Signed)
[facility]  ROUNDING NOTE   Subjective:   Confused and agitated.Tryingto climb out of bed Sitter at bedside.  UOP 2775 hrough left nephrostomy Right nephrostomy with bloody urine; 30 cc Sodium bicarb gtt 79mL/hr  Objective:  Vital signs in last 24 hours:  Temp:  [97.5 F (36.4 C)-98.5 F (36.9 C)] 98.5 F (36.9 C) (09/03 1215) Pulse Rate:  [88-108] 88 (09/03 1215) Resp:  [20-25] 20 (09/03 1215) BP: (113-131)/(66-75) 119/66 (09/03 1215) SpO2:  [100 %] 100 % (09/03 0510)  Weight change:  Filed Weights   11/23/17 0840 11/23/17 1348  Weight: 84.3 kg 79.8 kg    Intake/Output: I/O last 3 completed shifts: In: 1872.6 [P.O.:240; I.V.:1197.7; Other:40; IV Piggyback:395] Out: 9678 [Urine:4380]   Intake/Output this shift:  Total I/O In: -  Out: 825 [Urine:825]  Physical Exam: General: NAD, laying in bed  Head: Normocephalic, atraumatic. Dry oral mucosal membranes  Eyes: Anicteric,   Neck: Supple, trachea  Lungs:  Clear to auscultation  Heart: tachycardic  Abdomen:  Soft, nontender,   Extremities: no peripheral edema.  Neurologic: Confused, agitated  Skin: No acute lesions   GU Bilateral nephrostomy tubes    Basic Metabolic Panel: Recent Labs  Lab 11/23/17 0928 11/24/17 0522 11/25/17 0400 11/26/17 0359  NA 140 144 145 148*  K 5.9* 4.9 3.8 3.6  CL 113* 115* 115* 112*  CO2 14* 16* 20* 25  GLUCOSE 92 119* 133* 124*  BUN 100* 82* 59* 40*  CREATININE 10.82* 7.31* 4.10* 2.39*  CALCIUM 9.7 9.5 9.0 9.0  MG  --   --  1.6*  --   PHOS  --   --  4.1  --     Liver Function Tests: No results for input(s): AST, ALT, ALKPHOS, BILITOT, PROT, ALBUMIN in the last 168 hours. No results for input(s): LIPASE, AMYLASE in the last 168 hours. No results for input(s): AMMONIA in the last 168 hours.  CBC: Recent Labs  Lab 11/23/17 0928 11/25/17 0400  WBC 5.3 5.8  NEUTROABS 4.0  --   HGB 8.7* 7.9*  HCT 25.5* 23.2*  MCV 89.2 88.9  PLT 236 218    Cardiac  Enzymes: No results for input(s): CKTOTAL, CKMB, CKMBINDEX, TROPONINI in the last 168 hours.  BNP: Invalid input(s): POCBNP  CBG: No results for input(s): GLUCAP in the last 168 hours.  Microbiology: Results for orders placed or performed during the hospital encounter of 11/23/17  CULTURE, BLOOD (ROUTINE X 2) w Reflex to ID Panel     Status: None (Preliminary result)   Collection Time: 11/23/17  3:50 PM  Result Value Ref Range Status   Specimen Description BLOOD L HAND  Final   Special Requests   Final    BOTTLES DRAWN AEROBIC AND ANAEROBIC Blood Culture results may not be optimal due to an inadequate volume of blood received in culture bottles   Culture   Final    NO GROWTH 3 DAYS Performed at [facility], [address] [place], [place] [address]    Report Status PENDING  Incomplete  CULTURE, BLOOD (ROUTINE X 2) w Reflex to ID Panel     Status: None (Preliminary result)   Collection Time: 11/23/17  3:50 PM  Result Value Ref Range Status   Specimen Description BLOOD R HAND  Final   Special Requests   Final    BOTTLES DRAWN AEROBIC AND ANAEROBIC Blood Culture results may not be optimal due to an inadequate volume of blood received in culture bottles  Culture   Final    NO GROWTH 3 DAYS Performed at [facility], [address] [place], [place] [address]    Report Status PENDING  Incomplete    Coagulation Studies: No results for input(s): LABPROT, INR in the last 72 hours.  Urinalysis: Recent Labs    11/23/17 1625  COLORURINE RED*  LABSPEC 1.011  PHURINE 5.0  GLUCOSEU NEGATIVE  HGBUR LARGE*  BILIRUBINUR NEGATIVE  KETONESUR NEGATIVE  PROTEINUR 30*  NITRITE NEGATIVE  LEUKOCYTESUR NEGATIVE      Imaging: No results found.   Medications:   . sodium chloride     . feeding supplement (NEPRO CARB STEADY)  237 mL Oral BID BM  . haloperidol lactate  5 mg Intravenous Once  . megestrol  400 mg Oral BID  . multivitamin with minerals  1  tablet Oral Daily  . sodium chloride flush  10 mL Per Tube Q12H   sodium chloride, acetaminophen **OR** acetaminophen, HYDROcodone-acetaminophen, iopamidol, loratadine, morphine injection, ondansetron **OR** ondansetron (ZOFRAN) IV, polyethylene glycol, promethazine, sodium chloride flush  Assessment/ Plan:  [name] is a 72 y.o. white male with prostate cancer who was admitted to [facility] on 11/23/2017. Nephrostomy tubes bilaterally placed by IR on 8/31.   1. Acute renal failure: baseline creatinine of 1.19, GFR >60 on 09/16/17 2. Obstructive Nephropathy 3. Hyperkalemia 4. Metabolic acidosis 5. Hypernatremia 6. 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 Renal function improving status post bilateral nephrostomy tubes placed on 8/31.Right nephrostomy with blood tinged urine and UOP of small amount. Will need to be imaged when patient is stable.   D/c bicarb as acidosis has improved Change iv fluids to D5W Reason for Agitation is unclear ? related to psychoactive meds    LOS: 3 Marisella Puccio 9/3/20192:15 PM

## 2017-11-26 NOTE — Plan of Care (Signed)
  Problem: Education: Goal: Knowledge of General Education information will improve Description Including pain rating scale, medication(s)/side effects and non-pharmacologic comfort measures Outcome: Progressing   Problem: Clinical Measurements: Goal: Will remain free from infection Outcome: Progressing Goal: Diagnostic test results will improve Outcome: Progressing Goal: Respiratory complications will improve Outcome: Progressing Goal: Cardiovascular complication will be avoided Outcome: Progressing   Problem: Pain Managment: Goal: General experience of comfort will improve Outcome: Progressing

## 2017-11-26 NOTE — Care Management Important Message (Signed)
Important Message  Patient Details  Name: [name] MRN: [id number] Date of Birth: 04/05/1945   Medicare Important Message Given:  Yes    Juliann Pulse A Jerick Khachatryan 11/26/2017, 10:47 AM

## 2017-11-26 NOTE — Progress Notes (Signed)
PT Cancellation Note  Patient Details Name: [name] MRN: [id number] DOB: 12-12-1945   Cancelled Treatment:    Reason Eval/Treat Not Completed: Fatigue/lethargy limiting ability to participate Pt laying in bed sleeping, sitter and family agree that he is not appropriate for PT today.  Apparently they just got him to calm down and request to start tomorrow.  Kreg Shropshire, DPT 11/26/2017, 2:51 PM

## 2017-11-26 NOTE — Progress Notes (Signed)
[facility] [place] NAME: [name]    MR#:  [id number]  DATE OF BIRTH:  1945/09/09  SUBJECTIVE:  CHIEF COMPLAINT:   Chief Complaint  Patient presents with  . Flank Pain  . Back Pain  . Altered Mental Status   Patient is still confused. REVIEW OF SYSTEMS:  Review of Systems  Unable to perform ROS: Mental status change    DRUG ALLERGIES:   Allergies  Allergen Reactions  . Versed [Midazolam] Other (See Comments)    agitation   VITALS:  Blood pressure 113/68, pulse 96, temperature (!) 97.5 F (36.4 C), temperature source Axillary, resp. rate (!) 25, height 6' (1.829 m), weight 79.8 kg, SpO2 100 %. PHYSICAL EXAMINATION:  Physical Exam  Constitutional: He appears well-developed.  HENT:  Head: Normocephalic.  Eyes: No scleral icterus.  Neck: Neck supple. No JVD present. No tracheal deviation present.  Cardiovascular: Regular rhythm and normal heart sounds. Exam reveals no gallop.  No murmur heard. Tachycardia.  Pulmonary/Chest: Effort normal and breath sounds normal. No respiratory distress. He has no wheezes. He has no rales.  Abdominal: Soft. Bowel sounds are normal. He exhibits no distension. There is no tenderness. There is no rebound.  Musculoskeletal: Normal range of motion. He exhibits no edema or tenderness.  Neurological:  Patient is confused, unable to exam.  Skin: No rash noted. No erythema.   LABORATORY PANEL:  Male CBC Recent Labs  Lab 11/25/17 0400  WBC 5.8  HGB 7.9*  HCT 23.2*  PLT 218   ------------------------------------------------------------------------------------------------------------------ Chemistries  Recent Labs  Lab 11/19/17 1228  11/25/17 0400 11/26/17 0359  NA 144   < > 145 148*  K 4.7   < > 3.8 3.6  CL 115*   < > 115* 112*  CO2 18*   < > 20* 25  GLUCOSE 88   < > 133* 124*  BUN 50*   < > 59* 40*  CREATININE 4.53*   < > 4.10* 2.39*  CALCIUM 9.9   < > 9.0 9.0  MG  --   --   1.6*  --   AST 11*  --   --   --   ALT 6  --   --   --   ALKPHOS 75  --   --   --   BILITOT 0.3  --   --   --    < > = values in this interval not displayed.   RADIOLOGY:  No results found. ASSESSMENT AND PLAN:   *Acute renal failure due to hydronephrosis secondary to prostate cancer/mass Status post bilateral nephrostomy tube placement. Renal function is improving, creatinine decreased to 2.39.  Follow-up BMP.  *Acute encephalopathy, delirium. Aspiration the fall precaution.  MRI of the brain to rule out metastasis.  *Acute hyperkalemia.  Improved. *Acute pulmonary edema due to ARF, I doubt community-acquired pneumonia.  Disontinued Zithromax and Rocephin.  *Chronic worsening metastatic prostate cancer, follow-up as outpatient for chemo/radiation Anemia of chronic disease.  Hemoglobin down to 7.9, possible due to nephrostomy.  Follow-up hemoglobin.  All the records are reviewed and case discussed with Care Management/Social Worker. Management plans discussed with the patient, his wife and they are in agreement.  CODE STATUS: Full Code  TOTAL TIME TAKING CARE OF THIS PATIENT: 36 minutes.   More than 50% of the time was spent in counseling/coordination of care: YES  POSSIBLE D/C IN 2 DAYS, DEPENDING ON CLINICAL CONDITION.   Demetrios Loll  M.D on 11/26/2017 at 11:34 AM  Between 7am to 6pm - Pager - [phone]  After 6pm go to [email or web address] - [facility]

## 2017-11-26 NOTE — Progress Notes (Signed)
   11/26/17 1255  Clinical Encounter Type  Visited With Patient and family together  Visit Type Initial;Spiritual support  Referral From Nurse  Consult/Referral To Chaplain  Spiritual Encounters  Spiritual Needs Prayer;Emotional   [place] visted with patient and his wife while rounding on 1C. The patient is currently sedated due to his mental status. Mrs. Newson is by patient's bedside and emotional. CH offered emotional support and silent prayer.  will follow up as needed.

## 2017-11-27 ENCOUNTER — Ambulatory Visit: Payer: Medicare Other

## 2017-11-27 ENCOUNTER — Inpatient Hospital Stay: Payer: Medicare Other

## 2017-11-27 DIAGNOSIS — R4182 Altered mental status, unspecified: Secondary | ICD-10-CM

## 2017-11-27 LAB — AMMONIA: Ammonia: <9 umol/L — ABNORMAL LOW (ref 9–35)

## 2017-11-27 LAB — HEMOGLOBIN: Hemoglobin: 8.6 g/dL — ABNORMAL LOW (ref 13.0–18.0)

## 2017-11-27 LAB — BASIC METABOLIC PANEL WITH GFR
Anion gap: 10 (ref 5–15)
BUN: 34 mg/dL — ABNORMAL HIGH (ref 8–23)
CO2: 24 mmol/L (ref 22–32)
Calcium: 9 mg/dL (ref 8.9–10.3)
Chloride: 109 mmol/L (ref 98–111)
Creatinine, Ser: 2.19 mg/dL — ABNORMAL HIGH (ref 0.61–1.24)
GFR calc Af Amer: 33 mL/min — ABNORMAL LOW (ref >60)
GFR calc non Af Amer: 29 mL/min — ABNORMAL LOW (ref >60)
Glucose, Bld: 124 mg/dL — ABNORMAL HIGH (ref 70–99)
Potassium: 3.2 mmol/L — ABNORMAL LOW (ref 3.5–5.1)
Sodium: 143 mmol/L (ref 135–145)

## 2017-11-27 LAB — MAGNESIUM: Magnesium: 1.8 mg/dL (ref 1.7–2.4)

## 2017-11-27 MED ORDER — ZIPRASIDONE MESYLATE 20 MG IM SOLR
10.0000 mg | Freq: Three times a day (TID) | INTRAMUSCULAR | Status: DC | PRN
  Administered 2017-11-27 – 2017-12-01 (×5): 10 mg via INTRAMUSCULAR
  Filled 2017-11-27 (×7): qty 20

## 2017-11-27 MED ORDER — MORPHINE SULFATE (PF) 2 MG/ML IV SOLN
2.0000 mg | INTRAVENOUS | Status: DC | PRN
  Administered 2017-11-27 – 2017-11-28 (×3): 2 mg via INTRAVENOUS
  Filled 2017-11-27 (×3): qty 1

## 2017-11-27 MED ORDER — SODIUM CHLORIDE 0.9 % IV SOLN
500.0000 mL | Freq: Once | INTRAVENOUS | Status: AC
  Administered 2017-11-27: 21:00:00 500 mL via INTRAVENOUS

## 2017-11-27 MED ORDER — HYDROCODONE-ACETAMINOPHEN 5-325 MG PO TABS
1.0000 | ORAL_TABLET | Freq: Three times a day (TID) | ORAL | Status: DC | PRN
  Administered 2017-11-29 – 2017-12-02 (×2): 1 via ORAL
  Filled 2017-11-27 (×2): qty 1

## 2017-11-27 MED ORDER — POTASSIUM CHLORIDE 20 MEQ PO PACK
40.0000 meq | PACK | Freq: Once | ORAL | Status: DC
  Filled 2017-11-27: qty 2

## 2017-11-27 MED ORDER — POTASSIUM CHLORIDE 2 MEQ/ML IV SOLN
INTRAVENOUS | Status: DC
  Administered 2017-11-27 – 2017-11-28 (×2): via INTRAVENOUS
  Filled 2017-11-27 (×3): qty 1000

## 2017-11-27 NOTE — Progress Notes (Signed)
[facility]  ROUNDING NOTE   Subjective:   Less agitated today but not back to normal Sitter at bedside.  UOP 1700 LEFT-  left nephrostomy 1650 Right nephrostomy with bloody urine; 350 cc D5W @ 75 cc/hr   Objective:  Vital signs in last 24 hours:  Temp:  [98.9 F (37.2 C)] 98.9 F (37.2 C) (09/03 2036) Pulse Rate:  [81] 81 (09/03 2036) Resp:  [23] 23 (09/03 2036) BP: (101)/(44) 101/44 (09/03 2036) SpO2:  [100 %] 100 % (09/03 2036)  Weight change:  Filed Weights   11/23/17 0840 11/23/17 1348  Weight: 84.3 kg 79.8 kg    Intake/Output: I/O last 3 completed shifts: In: 2094.1 [I.V.:2074.1; Other:20] Out: 2720 [Urine:2720]   Intake/Output this shift:  No intake/output data recorded.  Physical Exam: General: NAD, laying in bed  Head: Normocephalic, atraumatic. Dry oral mucosal membranes  Eyes: Anicteric,   Neck: Supple, trachea  Lungs:  Clear to auscultation  Heart: tachycardic  Abdomen:  Soft, nontender,   Extremities: no peripheral edema.  Neurologic: Confused, agitated  Skin: No acute lesions   GU Bilateral nephrostomy tubes    Basic Metabolic Panel: Recent Labs  Lab 11/23/17 0928 11/24/17 0522 11/25/17 0400 11/26/17 0359 11/27/17 0543  NA 140 144 145 148* 143  K 5.9* 4.9 3.8 3.6 3.2*  CL 113* 115* 115* 112* 109  CO2 14* 16* 20* 25 24  GLUCOSE 92 119* 133* 124* 124*  BUN 100* 82* 59* 40* 34*  CREATININE 10.82* 7.31* 4.10* 2.39* 2.19*  CALCIUM 9.7 9.5 9.0 9.0 9.0  MG  --   --  1.6*  --  1.8  PHOS  --   --  4.1  --   --     Liver Function Tests: No results for input(s): AST, ALT, ALKPHOS, BILITOT, PROT, ALBUMIN in the last 168 hours. No results for input(s): LIPASE, AMYLASE in the last 168 hours. No results for input(s): AMMONIA in the last 168 hours.  CBC: Recent Labs  Lab 11/23/17 0928 11/25/17 0400 11/27/17 0543  WBC 5.3 5.8  --   NEUTROABS 4.0  --   --   HGB 8.7* 7.9* 8.6*  HCT 25.5* 23.2*  --   MCV 89.2 88.9  --   PLT  236 218  --     Cardiac Enzymes: No results for input(s): CKTOTAL, CKMB, CKMBINDEX, TROPONINI in the last 168 hours.  BNP: Invalid input(s): POCBNP  CBG: No results for input(s): GLUCAP in the last 168 hours.  Microbiology: Results for orders placed or performed during the hospital encounter of 11/23/17  CULTURE, BLOOD (ROUTINE X 2) w Reflex to ID Panel     Status: None (Preliminary result)   Collection Time: 11/23/17  3:50 PM  Result Value Ref Range Status   Specimen Description BLOOD L HAND  Final   Special Requests   Final    BOTTLES DRAWN AEROBIC AND ANAEROBIC Blood Culture results may not be optimal due to an inadequate volume of blood received in culture bottles   Culture   Final    NO GROWTH 4 DAYS Performed at [facility], [address] [place], [place] [address]    Report Status PENDING  Incomplete  CULTURE, BLOOD (ROUTINE X 2) w Reflex to ID Panel     Status: None (Preliminary result)   Collection Time: 11/23/17  3:50 PM  Result Value Ref Range Status   Specimen Description BLOOD R HAND  Final   Special Requests   Final  BOTTLES DRAWN AEROBIC AND ANAEROBIC Blood Culture results may not be optimal due to an inadequate volume of blood received in culture bottles   Culture   Final    NO GROWTH 4 DAYS Performed at [facility], [address] [place], [place] [address]    Report Status PENDING  Incomplete    Coagulation Studies: No results for input(s): LABPROT, INR in the last 72 hours.  Urinalysis: No results for input(s): COLORURINE, LABSPEC, PHURINE, GLUCOSEU, HGBUR, BILIRUBINUR, KETONESUR, PROTEINUR, UROBILINOGEN, NITRITE, LEUKOCYTESUR in the last 72 hours.  Invalid input(s): APPERANCEUR    Imaging: No results found.   Medications:   . sodium chloride    . dextrose 5 % with kcl 75 mL/hr at 11/27/17 1437   . feeding supplement (NEPRO CARB STEADY)  237 mL Oral BID BM  . haloperidol lactate  5 mg Intravenous Once  . megestrol   400 mg Oral BID  . multivitamin with minerals  1 tablet Oral Daily  . sodium chloride flush  10 mL Per Tube Q12H   sodium chloride, acetaminophen **OR** acetaminophen, HYDROcodone-acetaminophen, iopamidol, loratadine, morphine injection, ondansetron **OR** ondansetron (ZOFRAN) IV, polyethylene glycol, promethazine, sodium chloride flush, ziprasidone  Assessment/ Plan:  [name] is a 72 y.o. white male with prostate cancer who was admitted to [facility] on 11/23/2017. Nephrostomy tubes bilaterally placed by IR on 8/31.   1. Acute renal failure: baseline creatinine of 1.19, GFR >60 on 09/16/17 2. Obstructive Nephropathy 3. Hyperkalemia- now hypokalemia 4. Metabolic acidosis 5. Hypernatremia 6. 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 Renal function improving status post bilateral nephrostomy tubes placed on 8/31.Right nephrostomy with blood tinged urine and UOP of small amount. Will need to be evaluated by VIR D/c bicarb as acidosis has improved Continue D5W for another day Reason for Agitation is unclear ? related to psychoactive meds- neurology evaluation ongoing    LOS: 4 Sharod Petsch 9/4/20192:57 PM

## 2017-11-27 NOTE — Progress Notes (Addendum)
Subjective: Patient improved today but remains easily agitated.  Objective: Current vital signs: BP (!) 101/44 (BP Location: Right Arm)   Pulse 81   Temp 98.9 F (37.2 C)   Resp (!) 23   Ht 6' (1.829 m)   Wt 79.8 kg   SpO2 100%   BMI 23.87 kg/m  Vital signs in last 24 hours: Temp:  [98.9 F (37.2 C)] 98.9 F (37.2 C) (09/03 2036) Pulse Rate:  [81] 81 (09/03 2036) Resp:  [23] 23 (09/03 2036) BP: (101)/(44) 101/44 (09/03 2036) SpO2:  [100 %] 100 % (09/03 2036)  Intake/Output from previous day: 09/03 0701 - 09/04 0700 In: 1613.1 [I.V.:1593.1] Out: 1700 [Urine:1700] Intake/Output this shift: No intake/output data recorded. Nutritional status:  Diet Order            Diet NPO time specified  Diet effective now        Diet - low sodium heart healthy              Neurologic Exam: Mental Status: Alert, oriented to name, place and year. Easily agitated.  Uncooperative.  Speech fluent without evidence of aphasia.  Refuses to follow commands. Cranial Nerves: II: Blinks to bilateral confrontation III,IV, VI: Extra-ocular motions intact bilaterally V,VII: smile symmetric VIII: hearing normal bilaterally IX,X: unable to test XI: unable to test XII: unable to test Motor: Moves all extremities against gravity   Lab Results: Basic Metabolic Panel: Recent Labs  Lab 11/23/17 0928 11/24/17 0522 11/25/17 0400 11/26/17 0359 11/27/17 0543  NA 140 144 145 148* 143  K 5.9* 4.9 3.8 3.6 3.2*  CL 113* 115* 115* 112* 109  CO2 14* 16* 20* 25 24  GLUCOSE 92 119* 133* 124* 124*  BUN 100* 82* 59* 40* 34*  CREATININE 10.82* 7.31* 4.10* 2.39* 2.19*  CALCIUM 9.7 9.5 9.0 9.0 9.0  MG  --   --  1.6*  --  1.8  PHOS  --   --  4.1  --   --     Liver Function Tests: No results for input(s): AST, ALT, ALKPHOS, BILITOT, PROT, ALBUMIN in the last 168 hours. No results for input(s): LIPASE, AMYLASE in the last 168 hours. Recent Labs  Lab 11/27/17 1645  AMMONIA <9*     CBC: Recent Labs  Lab 11/23/17 0928 11/25/17 0400 11/27/17 0543  WBC 5.3 5.8  --   NEUTROABS 4.0  --   --   HGB 8.7* 7.9* 8.6*  HCT 25.5* 23.2*  --   MCV 89.2 88.9  --   PLT 236 218  --     Cardiac Enzymes: No results for input(s): CKTOTAL, CKMB, CKMBINDEX, TROPONINI in the last 168 hours.  Lipid Panel: No results for input(s): CHOL, TRIG, HDL, CHOLHDL, VLDL, LDLCALC in the last 168 hours.  CBG: No results for input(s): GLUCAP in the last 168 hours.  Microbiology: Results for orders placed or performed during the hospital encounter of 11/23/17  CULTURE, BLOOD (ROUTINE X 2) w Reflex to ID Panel     Status: None (Preliminary result)   Collection Time: 11/23/17  3:50 PM  Result Value Ref Range Status   Specimen Description BLOOD L HAND  Final   Special Requests   Final    BOTTLES DRAWN AEROBIC AND ANAEROBIC Blood Culture results may not be optimal due to an inadequate volume of blood received in culture bottles   Culture   Final    NO GROWTH 4 DAYS Performed at [facility], [address] [place], [place] [address]    Report Status PENDING  Incomplete  CULTURE, BLOOD (ROUTINE X 2) w Reflex to ID Panel     Status: None (Preliminary result)   Collection Time: 11/23/17  3:50 PM  Result Value Ref Range Status   Specimen Description BLOOD R HAND  Final   Special Requests   Final    BOTTLES DRAWN AEROBIC AND ANAEROBIC Blood Culture results may not be optimal due to an inadequate volume of blood received in culture bottles   Culture   Final    NO GROWTH 4 DAYS Performed at [facility], [address] [place], [place] [address]    Report Status PENDING  Incomplete    Coagulation Studies: No results for input(s): LABPROT, INR in the last 72 hours.  Imaging: US Renal  Result Date: 11/27/2017 CLINICAL DATA:  Gross hematuria. Status post bilateral nephrostomy tubes. History of prostate carcinoma. EXAM: RENAL / URINARY TRACT ULTRASOUND COMPLETE  COMPARISON:  CT, 11/23/2017 FINDINGS: Right Kidney: Length: 8.4 cm. Diffuse cortical thinning. No renal masses or convincing stones. No hydronephrosis. Left Kidney: Length: 10.5 cm. Suboptimal visualization. Normal overall parenchymal echogenicity. Lobulated contour. No convincing mass or stone. No visualized hydronephrosis. Bladder: Irregular posterior bladder mass measuring approximately 9 x 4 x 5 cm. Color Doppler blood flow is seen within the mass. IMPRESSION: 1. No sonographic evidence of hydronephrosis. The significant hydroureteronephrosis noted on the prior CT is not evident sonographically. It is not clear whether the nephrostomy tubes remain in place, with tubes not visualized sonographically. 2. Irregular bladder mass measuring 9 cm in greatest dimension. This is consistent with prostate carcinoma invading the bladder, as noted on the current CT. Electronically Signed   By: Lajean Manes M.D.   On: 11/27/2017 15:38    Medications:  I have reviewed the patient's current medications. Scheduled: . feeding supplement (NEPRO CARB STEADY)  237 mL Oral BID BM  . haloperidol lactate  5 mg Intravenous Once  . megestrol  400 mg Oral BID  . multivitamin with minerals  1 tablet Oral Daily  . sodium chloride flush  10 mL Per Tube Q12H    Assessment/Plan: Patient improved some but still not at baseline per wife.  Renal function stabilized but still above baseline.   Recommendations: 1. Patient to have MRI of the brain without contrast.  Will premedicate with Geodon since patient seems most responsive to this.  If MRI of the brain unable to be performed patient to have head CT without contrast.  Wife aware that patient may not be cooperative for this.   LOS: 4 days   Alexis Goodell, MD Neurology [phone] 11/27/2017  6:09 PM

## 2017-11-27 NOTE — Progress Notes (Signed)
SLP Cancellation Note  Patient Details Name: [name] MRN: [id number] DOB: 12-03-1945   Cancelled treatment:       Reason Eval/Treat Not Completed: Patient's level of consciousness;Patient not medically ready(chart reviewed; consulted NSG then met w/ pt and family). Upon entering room, pt was agitated and moving about in the bed; muttered/mumbled speech. He could not follow commands. Discussed w/ family that pt appears at too high a risk for choking/aspiration w/ po's at this time d/t declined Cognitive status. Recommend NPO status for tonight w/ oral care when awake; alternative means for medications. NSG consutled and agreed. Family agreed.  ST services will f/u in the morning for assessment.     Orinda Kenner, MS, CCC-SLP Gethsemane Fischler 11/27/2017, 4:08 PM

## 2017-11-27 NOTE — Progress Notes (Signed)
PT Cancellation Note  Patient Details Name: AVIGDOR DOLLAR MRN: [id number] DOB: Jun 07, 1945   Cancelled Treatment:    Reason Eval/Treat Not Completed: Patient at procedure or test/unavailable(Evaluation re-attempted.  Patient currently off unit for diagnostic testing.  Will re-attempt at later time/date as medically appropriate and available.)   Tamieka Rancourt H. Owens Shark, PT, DPT, NCS 11/27/17, 3:32 PM [phone]

## 2017-11-27 NOTE — Progress Notes (Signed)
[name] at [place] NAME: [name]    MR#:  [id number]  DATE OF BIRTH:  1946-01-25  SUBJECTIVE:  CHIEF COMPLAINT:   Chief Complaint  Patient presents with  . Flank Pain  . Back Pain  . Altered Mental Status  Patient continues to be confused and disoriented, wife at the bedside, patient has eaten in several days, speech therapy to see, noted gross bloody hematuria in right nephrostomy tube bag, for renal ultrasound, case discussed with neurology-for imaging-most likely will need Geodon for sedation, await further nephrology recommendations, physical therapy to see  REVIEW OF SYSTEMS:  CONSTITUTIONAL: No fever, fatigue or weakness.  EYES: No blurred or double vision.  EARS, NOSE, AND THROAT: No tinnitus or ear pain.  RESPIRATORY: No cough, shortness of breath, wheezing or hemoptysis.  CARDIOVASCULAR: No chest pain, orthopnea, edema.  GASTROINTESTINAL: No nausea, vomiting, diarrhea or abdominal pain.  GENITOURINARY: No dysuria, hematuria.  ENDOCRINE: No polyuria, nocturia,  HEMATOLOGY: No anemia, easy bruising or bleeding SKIN: No rash or lesion. MUSCULOSKELETAL: No joint pain or arthritis.   NEUROLOGIC: No tingling, numbness, weakness.  PSYCHIATRY: No anxiety or depression.   ROS  DRUG ALLERGIES:   Allergies  Allergen Reactions  . Versed [Midazolam] Other (See Comments)    agitation    VITALS:  Blood pressure (!) 101/44, pulse 81, temperature 98.9 F (37.2 C), resp. rate (!) 23, height 6' (1.829 m), weight 79.8 kg, SpO2 100 %.  PHYSICAL EXAMINATION:  GENERAL:  72 y.o.-year-old patient lying in the bed with no acute distress.  EYES: Pupils equal, round, reactive to light and accommodation. No scleral icterus. Extraocular muscles intact.  HEENT: Head atraumatic, normocephalic. Oropharynx and nasopharynx clear.  NECK:  Supple, no jugular venous distention. No thyroid enlargement, no tenderness.  LUNGS: Normal breath sounds  bilaterally, no wheezing, rales,rhonchi or crepitation. No use of accessory muscles of respiration.  CARDIOVASCULAR: S1, S2 normal. No murmurs, rubs, or gallops.  ABDOMEN: Soft, nontender, nondistended. Bowel sounds present. No organomegaly or mass.  EXTREMITIES: No pedal edema, cyanosis, or clubbing.  NEUROLOGIC: Cranial nerves II through XII are intact. Muscle strength 5/5 in all extremities. Sensation intact. Gait not checked.  PSYCHIATRIC: The patient is alert and oriented x 3.  SKIN: No obvious rash, lesion, or ulcer.   Physical Exam LABORATORY PANEL:   CBC Recent Labs  Lab 11/25/17 0400 11/27/17 0543  WBC 5.8  --   HGB 7.9* 8.6*  HCT 23.2*  --   PLT 218  --    ------------------------------------------------------------------------------------------------------------------  Chemistries  Recent Labs  Lab 11/27/17 0543  NA 143  K 3.2*  CL 109  CO2 24  GLUCOSE 124*  BUN 34*  CREATININE 2.19*  CALCIUM 9.0  MG 1.8   ------------------------------------------------------------------------------------------------------------------  Cardiac Enzymes No results for input(s): TROPONINI in the last 168 hours. ------------------------------------------------------------------------------------------------------------------  RADIOLOGY:  No results found.  ASSESSMENT AND PLAN:  *Acute renal failure  Resolving Due to b/l hydronephrosis due to obstructive uropathy-prostate cancer/mass  *Acute bilateral hydronephrosis  Due to obstructive uropathy-prostate cancer/mass  S/p bilateral nephrostomy tube placement by IR Urology consulted  *Acute gross right hematuria Check renal ultrasound for further evaluation/care  *Acute encephalopathy, delirium No improvement Continue increased nursing care PRN, aspiration/fall precautions while in house, neurology input appreciated-for neuroimaging later today for further evaluation, check ammonia level, RPR, speech therapy to  see  *Acute hypokalemia  Replete with p.o. potassium, magnesium level was normal, BMP in the morning   *Acute  pulmonary edema Due to ARF CAP ruled out -antibiotics discontinued  *Chronic worsening metastatic prostate cancer follow-up as outpatient for chemo/radiation  *Anemia of chronic disease Hemoglobin stable  All the records are reviewed and case discussed with Care Management/Social Workerr. Management plans discussed with the patient, family and they are in agreement.  CODE STATUS: full  TOTAL TIME TAKING CARE OF THIS PATIENT: 40 minutes.     POSSIBLE D/C IN 2-3 DAYS, DEPENDING ON CLINICAL CONDITION.   Avel Peace Lyne Khurana M.D on 11/27/2017   Between 7am to 6pm - Pager - [phone]  After 6pm go to [email or web address] - password [email or web address] [facility]  Office  [phone]  CC: Primary care physician; [name], MD  Note: This dictation was prepared with Dragon dictation along with smaller phrase technology. Any transcriptional errors that result from this process are unintentional.

## 2017-11-28 ENCOUNTER — Inpatient Hospital Stay: Payer: Medicare Other

## 2017-11-28 ENCOUNTER — Ambulatory Visit: Payer: Medicare Other

## 2017-11-28 LAB — BASIC METABOLIC PANEL
ANION GAP: 7 (ref 5–15)
BUN: 29 mg/dL — ABNORMAL HIGH (ref 8–23)
CHLORIDE: 108 mmol/L (ref 98–111)
CO2: 23 mmol/L (ref 22–32)
Calcium: 8.7 mg/dL — ABNORMAL LOW (ref 8.9–10.3)
Creatinine, Ser: 1.78 mg/dL — ABNORMAL HIGH (ref 0.61–1.24)
GFR calc Af Amer: 43 mL/min — ABNORMAL LOW (ref >60)
GFR, EST NON AFRICAN AMERICAN: 37 mL/min — AB (ref >60)
Glucose, Bld: 108 mg/dL — ABNORMAL HIGH (ref 70–99)
POTASSIUM: 3.6 mmol/L (ref 3.5–5.1)
Sodium: 138 mmol/L (ref 135–145)

## 2017-11-28 LAB — HEMOGLOBIN AND HEMATOCRIT, BLOOD
HCT: 22.8 % — ABNORMAL LOW (ref 40.0–52.0)
HCT: 23.2 % — ABNORMAL LOW (ref 40.0–52.0)
HEMOGLOBIN: 7.9 g/dL — AB (ref 13.0–18.0)
Hemoglobin: 8.2 g/dL — ABNORMAL LOW (ref 13.0–18.0)

## 2017-11-28 LAB — CULTURE, BLOOD (ROUTINE X 2)
CULTURE: NO GROWTH
Culture: NO GROWTH

## 2017-11-28 LAB — RPR: RPR Ser Ql: NONREACTIVE

## 2017-11-28 MED ORDER — DIPHENHYDRAMINE HCL 50 MG/ML IJ SOLN
12.5000 mg | Freq: Once | INTRAMUSCULAR | Status: AC
  Administered 2017-11-28: 22:00:00 12.5 mg via INTRAVENOUS
  Filled 2017-11-28: qty 0.25

## 2017-11-28 MED ORDER — ZIPRASIDONE MESYLATE 20 MG IM SOLR
10.0000 mg | Freq: Once | INTRAMUSCULAR | Status: AC
  Administered 2017-11-28: 02:00:00 10 mg via INTRAMUSCULAR
  Filled 2017-11-28: qty 20

## 2017-11-28 MED ORDER — LORAZEPAM 2 MG/ML IJ SOLN
1.0000 mg | Freq: Once | INTRAMUSCULAR | Status: AC
  Administered 2017-11-28: 22:00:00 1 mg via INTRAVENOUS
  Filled 2017-11-28: qty 1

## 2017-11-28 MED ORDER — LORAZEPAM 2 MG/ML IJ SOLN
1.0000 mg | Freq: Once | INTRAMUSCULAR | Status: AC
  Administered 2017-11-28: 1 mg via INTRAVENOUS
  Filled 2017-11-28: qty 1

## 2017-11-28 MED ORDER — DEXTROSE-NACL 5-0.45 % IV SOLN
INTRAVENOUS | Status: DC
  Administered 2017-11-28 – 2017-11-29 (×3): via INTRAVENOUS

## 2017-11-28 MED ORDER — DIPHENHYDRAMINE HCL 50 MG/ML IJ SOLN
12.5000 mg | Freq: Once | INTRAMUSCULAR | Status: AC
  Administered 2017-11-28: 12.5 mg via INTRAVENOUS
  Filled 2017-11-28: qty 0.25

## 2017-11-28 NOTE — Care Management Important Message (Signed)
Important Message  Patient Details  Name: [name] MRN: [id number] Date of Birth: 27-Jun-1945   Medicare Important Message Given:  Yes    Juliann Pulse A Ardis Fullwood 11/28/2017, 11:07 AM

## 2017-11-28 NOTE — Progress Notes (Signed)
[facility]  ROUNDING NOTE   Subjective:   Still significantly agitated Wife and sitter at bedside.  D5W @ 75 cc/hr  Urine output reported to the nephrostomies were serum creatinine further improved to 1.78 today  Objective:  Vital signs in last 24 hours:  Temp:  [97.7 F (36.5 C)-98.1 F (36.7 C)] 97.7 F (36.5 C) (09/05 0648) Pulse Rate:  [86-98] 86 (09/05 0648) Resp:  [18-24] 24 (09/05 0648) BP: (83-119)/(51-91) 119/91 (09/05 0648) SpO2:  [94 %-100 %] 94 % (09/05 0648)  Weight change:  Filed Weights   11/23/17 0840 11/23/17 1348  Weight: 84.3 kg 79.8 kg    Intake/Output: I/O last 3 completed shifts: In: 58 [I.V.:3697; Other:520] Out: 850 [Urine:850]   Intake/Output this shift:  Total I/O In: 350 [Other:350] Out: -   Physical Exam: General: NAD, laying in bed  Head: Normocephalic, atraumatic. Dry oral mucosal membranes  Eyes: Anicteric,   Neck: Supple, trachea  Lungs:  Clear to auscultation  Heart: tachycardic  Abdomen:  Soft, nontender,   Extremities: no peripheral edema.  Neurologic: Confused, agitated  Skin: No acute lesions   GU Bilateral nephrostomy tubes    Basic Metabolic Panel: Recent Labs  Lab 11/24/17 0522 11/25/17 0400 11/26/17 0359 11/27/17 0543 11/28/17 0922  NA 144 145 148* 143 138  K 4.9 3.8 3.6 3.2* 3.6  CL 115* 115* 112* 109 108  CO2 16* 20* 25 24 23   GLUCOSE 119* 133* 124* 124* 108*  BUN 82* 59* 40* 34* 29*  CREATININE 7.31* 4.10* 2.39* 2.19* 1.78*  CALCIUM 9.5 9.0 9.0 9.0 8.7*  MG  --  1.6*  --  1.8  --   PHOS  --  4.1  --   --   --     Liver Function Tests: No results for input(s): AST, ALT, ALKPHOS, BILITOT, PROT, ALBUMIN in the last 168 hours. No results for input(s): LIPASE, AMYLASE in the last 168 hours. Recent Labs  Lab 11/27/17 1645  AMMONIA <9*    CBC: Recent Labs  Lab 11/23/17 0928 11/25/17 0400 11/27/17 0543 11/28/17 0922  WBC 5.3 5.8  --   --   NEUTROABS 4.0  --   --   --   HGB  8.7* 7.9* 8.6* 7.9*  HCT 25.5* 23.2*  --  22.8*  MCV 89.2 88.9  --   --   PLT 236 218  --   --     Cardiac Enzymes: No results for input(s): CKTOTAL, CKMB, CKMBINDEX, TROPONINI in the last 168 hours.  BNP: Invalid input(s): POCBNP  CBG: No results for input(s): GLUCAP in the last 168 hours.  Microbiology: Results for orders placed or performed during the hospital encounter of 11/23/17  CULTURE, BLOOD (ROUTINE X 2) w Reflex to ID Panel     Status: None   Collection Time: 11/23/17  3:50 PM  Result Value Ref Range Status   Specimen Description BLOOD L HAND  Final   Special Requests   Final    BOTTLES DRAWN AEROBIC AND ANAEROBIC Blood Culture results may not be optimal due to an inadequate volume of blood received in culture bottles   Culture   Final    NO GROWTH 5 DAYS Performed at [facility], [address], [place], [place] [address]    Report Status 11/28/2017 FINAL  Final  CULTURE, BLOOD (ROUTINE X 2) w Reflex to ID Panel     Status: None   Collection Time: 11/23/17  3:50 PM  Result Value  Ref Range Status   Specimen Description BLOOD R HAND  Final   Special Requests   Final    BOTTLES DRAWN AEROBIC AND ANAEROBIC Blood Culture results may not be optimal due to an inadequate volume of blood received in culture bottles   Culture   Final    NO GROWTH 5 DAYS Performed at [facility], [address] [place],  [address]    Report Status 11/28/2017 FINAL  Final    Coagulation Studies: No results for input(s): LABPROT, INR in the last 72 hours.  Urinalysis: No results for input(s): COLORURINE, LABSPEC, PHURINE, GLUCOSEU, HGBUR, BILIRUBINUR, KETONESUR, PROTEINUR, UROBILINOGEN, NITRITE, LEUKOCYTESUR in the last 72 hours.  Invalid input(s): APPERANCEUR    Imaging: US Renal  Result Date: 11/27/2017 CLINICAL DATA:  Gross hematuria. Status post bilateral nephrostomy tubes. History of prostate carcinoma. EXAM: RENAL / URINARY TRACT ULTRASOUND  COMPLETE COMPARISON:  CT, 11/23/2017 FINDINGS: Right Kidney: Length: 8.4 cm. Diffuse cortical thinning. No renal masses or convincing stones. No hydronephrosis. Left Kidney: Length: 10.5 cm. Suboptimal visualization. Normal overall parenchymal echogenicity. Lobulated contour. No convincing mass or stone. No visualized hydronephrosis. Bladder: Irregular posterior bladder mass measuring approximately 9 x 4 x 5 cm. Color Doppler blood flow is seen within the mass. IMPRESSION: 1. No sonographic evidence of hydronephrosis. The significant hydroureteronephrosis noted on the prior CT is not evident sonographically. It is not clear whether the nephrostomy tubes remain in place, with tubes not visualized sonographically. 2. Irregular bladder mass measuring 9 cm in greatest dimension. This is consistent with prostate carcinoma invading the bladder, as noted on the current CT. Electronically Signed   By: Lajean Manes M.D.   On: 11/27/2017 15:38     Medications:   . sodium chloride    . dextrose 5 % with kcl 75 mL/hr at 11/28/17 0521   . feeding supplement (NEPRO CARB STEADY)  237 mL Oral BID BM  . megestrol  400 mg Oral BID  . multivitamin with minerals  1 tablet Oral Daily  . sodium chloride flush  10 mL Per Tube Q12H   sodium chloride, acetaminophen **OR** acetaminophen, HYDROcodone-acetaminophen, iopamidol, loratadine, morphine injection, ondansetron **OR** ondansetron (ZOFRAN) IV, polyethylene glycol, promethazine, sodium chloride flush, ziprasidone  Assessment/ Plan:  [name] is a 72 y.o. white male with prostate cancer who was admitted to [facility] on 11/23/2017. Nephrostomy tubes bilaterally placed by IR on 8/31.   1. Acute renal failure: baseline creatinine of 1.19, GFR >60 on 09/16/17 2. Obstructive Nephropathy 3. Hyperkalemia- now hypokalemia 4. Metabolic acidosis 5. Hypernatremia 6. Gross hematuria, right nephrostomy  Impression Obstructive uropathy secondary to metastatic prostate  cancer. Renal function improving status post bilateral nephrostomy tubes placed on 8/31.Right nephrostomy with blood tinged urine and UOP of small amount. Will need to be evaluated by VIR D/c bicarb as acidosis has improved Change D5W to D5W + half-normal saline Reason for Agitation is unclear ? related to psychoactive meds- neurology evaluation ongoing    LOS: 5 Toyoko Silos 9/5/201911:30 AM

## 2017-11-28 NOTE — Progress Notes (Signed)
Pt oriented to self. Knows his age. Speech incomprehensible at times. Pt was given bed bath, massage and linen was changed earlier and he spoke intelligently, but as the night went on he got progressively worse. Pt became  very agitated and combative throughout the night. MD notified x 2 pertaining to pt behavior. Pt has safety sitter at bedside. Pt has managed to kick, grab and use profanity against staff. He received Geodon 10 mg once IM without relief. MD notified, pt received an additional 10 mg of IM Geodon without much relief- he was still very restless, agitated and pulling at medical lines. At one point he kicked the sitter in the stomach. This [place] to put in a safety zone portal . Administrative coordinator Estill Batten was also notified of the incident. Pt received Morphine twice for complaints of neck pain. Furthermore, MD notified the last time and pt got Ativan 1 mg and Benadryl 12.5 mg, he is finally calm and resting.

## 2017-11-28 NOTE — Progress Notes (Signed)
Dr. Jerelyn Charles notified wife of MRI result via telephone. No new orders at this time. Madlyn Frankel, RN

## 2017-11-28 NOTE — Progress Notes (Signed)
Subjective: Patient has remained altered. Now sedated and agitated but has received Benadryl, Haldol, Ativan, Morphine and Geodon this morning.    Objective: Current vital signs: BP (!) 119/91 (BP Location: Right Arm)   Pulse 86   Temp 97.7 F (36.5 C) (Oral)   Resp (!) 24   Ht 6' (1.829 m)   Wt 79.8 kg   SpO2 94%   BMI 23.87 kg/m  Vital signs in last 24 hours: Temp:  [97.7 F (36.5 C)-98.1 F (36.7 C)] 97.7 F (36.5 C) (09/05 2263) Pulse Rate:  [86-98] 86 (09/05 0648) Resp:  [18-24] 24 (09/05 0648) BP: (83-119)/(51-91) 119/91 (09/05 0648) SpO2:  [94 %-100 %] 94 % (09/05 0648)  Intake/Output from previous day: 09/04 0701 - 09/05 0700 In: 3311.1 [I.V.:2811.1] Out: 250 [Urine:250] Intake/Output this shift: Total I/O In: 350 [Other:350] Out: -  Nutritional status:  Diet Order            Diet NPO time specified  Diet effective now        Diet - low sodium heart healthy              Neurologic Exam: Mental Status: Lethargic. Easily aroused with stimulation.  Combative when aroused.  Uncooperative. Does not follow commands or speak.   Cranial Nerves: No facial asymmetry noted.   Motor: Moves all extremities spontaneously against gravity.   Sensory: Responds to noxious stimuli throughout   Lab Results: Basic Metabolic Panel: Recent Labs  Lab 11/24/17 0522 11/25/17 0400 11/26/17 0359 11/27/17 0543 11/28/17 0922  NA 144 145 148* 143 138  K 4.9 3.8 3.6 3.2* 3.6  CL 115* 115* 112* 109 108  CO2 16* 20* 25 24 23   GLUCOSE 119* 133* 124* 124* 108*  BUN 82* 59* 40* 34* 29*  CREATININE 7.31* 4.10* 2.39* 2.19* 1.78*  CALCIUM 9.5 9.0 9.0 9.0 8.7*  MG  --  1.6*  --  1.8  --   PHOS  --  4.1  --   --   --     Liver Function Tests: No results for input(s): AST, ALT, ALKPHOS, BILITOT, PROT, ALBUMIN in the last 168 hours. No results for input(s): LIPASE, AMYLASE in the last 168 hours. Recent Labs  Lab 11/27/17 1645  AMMONIA <9*    CBC: Recent Labs  Lab  11/23/17 0928 11/25/17 0400 11/27/17 0543 11/28/17 0922  WBC 5.3 5.8  --   --   NEUTROABS 4.0  --   --   --   HGB 8.7* 7.9* 8.6* 7.9*  HCT 25.5* 23.2*  --  22.8*  MCV 89.2 88.9  --   --   PLT 236 218  --   --     Cardiac Enzymes: No results for input(s): CKTOTAL, CKMB, CKMBINDEX, TROPONINI in the last 168 hours.  Lipid Panel: No results for input(s): CHOL, TRIG, HDL, CHOLHDL, VLDL, LDLCALC in the last 168 hours.  CBG: No results for input(s): GLUCAP in the last 168 hours.  Microbiology: Results for orders placed or performed during the hospital encounter of 11/23/17  CULTURE, BLOOD (ROUTINE X 2) w Reflex to ID Panel     Status: None   Collection Time: 11/23/17  3:50 PM  Result Value Ref Range Status   Specimen Description BLOOD L HAND  Final   Special Requests   Final    BOTTLES DRAWN AEROBIC AND ANAEROBIC Blood Culture results may not be optimal due to an inadequate volume of blood received in culture bottles   Culture  Final    NO GROWTH 5 DAYS Performed at [facility], [address] [place], [place] [address]    Report Status 11/28/2017 FINAL  Final  CULTURE, BLOOD (ROUTINE X 2) w Reflex to ID Panel     Status: None   Collection Time: 11/23/17  3:50 PM  Result Value Ref Range Status   Specimen Description BLOOD R HAND  Final   Special Requests   Final    BOTTLES DRAWN AEROBIC AND ANAEROBIC Blood Culture results may not be optimal due to an inadequate volume of blood received in culture bottles   Culture   Final    NO GROWTH 5 DAYS Performed at [facility], [address] [place], [place] [address]    Report Status 11/28/2017 FINAL  Final    Coagulation Studies: No results for input(s): LABPROT, INR in the last 72 hours.  Imaging: US Renal  Result Date: 11/27/2017 CLINICAL DATA:  Gross hematuria. Status post bilateral nephrostomy tubes. History of prostate carcinoma. EXAM: RENAL / URINARY TRACT ULTRASOUND COMPLETE COMPARISON:  CT,  11/23/2017 FINDINGS: Right Kidney: Length: 8.4 cm. Diffuse cortical thinning. No renal masses or convincing stones. No hydronephrosis. Left Kidney: Length: 10.5 cm. Suboptimal visualization. Normal overall parenchymal echogenicity. Lobulated contour. No convincing mass or stone. No visualized hydronephrosis. Bladder: Irregular posterior bladder mass measuring approximately 9 x 4 x 5 cm. Color Doppler blood flow is seen within the mass. IMPRESSION: 1. No sonographic evidence of hydronephrosis. The significant hydroureteronephrosis noted on the prior CT is not evident sonographically. It is not clear whether the nephrostomy tubes remain in place, with tubes not visualized sonographically. 2. Irregular bladder mass measuring 9 cm in greatest dimension. This is consistent with prostate carcinoma invading the bladder, as noted on the current CT. Electronically Signed   By: Lajean Manes M.D.   On: 11/27/2017 15:38    Medications:  I have reviewed the patient's current medications. Scheduled: . feeding supplement (NEPRO CARB STEADY)  237 mL Oral BID BM  . megestrol  400 mg Oral BID  . multivitamin with minerals  1 tablet Oral Daily  . sodium chloride flush  10 mL Per Tube Q12H    Assessment/Plan: Patient remains altered.  Renal function improved but remains altered above baseline.  Will attempt to image today.  Recommendations: 1. Will attempt MRI of the brain without contrast today while patient sedated.  If patient unable to tolerate will obtain head CT.     LOS: 5 days   Alexis Goodell, MD Neurology [phone] 11/28/2017  11:52 AM

## 2017-11-28 NOTE — Progress Notes (Signed)
[place] [facility] [place] NAME: [name]    MR#:  [id number]  DATE OF BIRTH:  07-11-1945  SUBJECTIVE:  CHIEF COMPLAINT:   Chief Complaint  Patient presents with  . Flank Pain  . Back Pain  . Altered Mental Status  Patient continues to be encephalopathic per staff, case discussed with neurology-for MRI of the brain later today, case discussed with nephrology  REVIEW OF SYSTEMS:  CONSTITUTIONAL: No fever, fatigue or weakness.  EYES: No blurred or double vision.  EARS, NOSE, AND THROAT: No tinnitus or ear pain.  RESPIRATORY: No cough, shortness of breath, wheezing or hemoptysis.  CARDIOVASCULAR: No chest pain, orthopnea, edema.  GASTROINTESTINAL: No nausea, vomiting, diarrhea or abdominal pain.  GENITOURINARY: No dysuria, hematuria.  ENDOCRINE: No polyuria, nocturia,  HEMATOLOGY: No anemia, easy bruising or bleeding SKIN: No rash or lesion. MUSCULOSKELETAL: No joint pain or arthritis.   NEUROLOGIC: No tingling, numbness, weakness.  PSYCHIATRY: No anxiety or depression.   ROS  DRUG ALLERGIES:   Allergies  Allergen Reactions  . Versed [Midazolam] Other (See Comments)    agitation    VITALS:  Blood pressure (!) 119/91, pulse 86, temperature 97.7 F (36.5 C), temperature source Oral, resp. rate (!) 24, height 6' (1.829 m), weight 79.8 kg, SpO2 94 %.  PHYSICAL EXAMINATION:  GENERAL:  72 y.o.-year-old patient lying in the bed with no acute distress.  EYES: Pupils equal, round, reactive to light and accommodation. No scleral icterus. Extraocular muscles intact.  HEENT: Head atraumatic, normocephalic. Oropharynx and nasopharynx clear.  NECK:  Supple, no jugular venous distention. No thyroid enlargement, no tenderness.  LUNGS: Normal breath sounds bilaterally, no wheezing, rales,rhonchi or crepitation. No use of accessory muscles of respiration.  CARDIOVASCULAR: S1, S2 normal. No murmurs, rubs, or gallops.  ABDOMEN: Soft, nontender,  nondistended. Bowel sounds present. No organomegaly or mass.  EXTREMITIES: No pedal edema, cyanosis, or clubbing.  NEUROLOGIC: Cranial nerves II through XII are intact. Muscle strength 5/5 in all extremities. Sensation intact. Gait not checked.  PSYCHIATRIC: The patient is alert and oriented x 3.  SKIN: No obvious rash, lesion, or ulcer.   Physical Exam LABORATORY PANEL:   CBC Recent Labs  Lab 11/25/17 0400  11/28/17 0922  WBC 5.8  --   --   HGB 7.9*   < > 7.9*  HCT 23.2*  --  22.8*  PLT 218  --   --    < > = values in this interval not displayed.   ------------------------------------------------------------------------------------------------------------------  Chemistries  Recent Labs  Lab 11/27/17 0543 11/28/17 0922  NA 143 138  K 3.2* 3.6  CL 109 108  CO2 24 23  GLUCOSE 124* 108*  BUN 34* 29*  CREATININE 2.19* 1.78*  CALCIUM 9.0 8.7*  MG 1.8  --    ------------------------------------------------------------------------------------------------------------------  Cardiac Enzymes No results for input(s): TROPONINI in the last 168 hours. ------------------------------------------------------------------------------------------------------------------  RADIOLOGY:  US Renal  Result Date: 11/27/2017 CLINICAL DATA:  Gross hematuria. Status post bilateral nephrostomy tubes. History of prostate carcinoma. EXAM: RENAL / URINARY TRACT ULTRASOUND COMPLETE COMPARISON:  CT, 11/23/2017 FINDINGS: Right Kidney: Length: 8.4 cm. Diffuse cortical thinning. No renal masses or convincing stones. No hydronephrosis. Left Kidney: Length: 10.5 cm. Suboptimal visualization. Normal overall parenchymal echogenicity. Lobulated contour. No convincing mass or stone. No visualized hydronephrosis. Bladder: Irregular posterior bladder mass measuring approximately 9 x 4 x 5 cm. Color Doppler blood flow is seen within the mass. IMPRESSION: 1. No sonographic evidence of hydronephrosis.  The significant  hydroureteronephrosis noted on the prior CT is not evident sonographically. It is not clear whether the nephrostomy tubes remain in place, with tubes not visualized sonographically. 2. Irregular bladder mass measuring 9 cm in greatest dimension. This is consistent with prostate carcinoma invading the bladder, as noted on the current CT. Electronically Signed   By: Lajean Manes M.D.   On: 11/27/2017 15:38    ASSESSMENT AND PLAN:  *Acute renal failure  Resolving Due to b/l hydronephrosis due to obstructive uropathy-prostate cancer/mass Continue IV fluids for rehydration, avoid nephrotoxic agents, renal ultrasound negative for hydronephrosis  *Acute bilateral hydronephrosis  Resolved Due to obstructive uropathy-prostate cancer/mass  S/p bilateral nephrostomy tube placement by IR Urology and nephrology input appreciated   *Acute gross right hematuria Resolving Renal ultrasound negative for hydronephrosis  *Acute encephalopathy, delirium No improvement Continue increased nursing care PRN, aspiration/fall precautions while in house, neurology input appreciated, follow-up MRI of the brain, ammonia level negative, RPR nonreactive, speech therapy following   *Acute hypokalemia  Repleted   *Acute pulmonary edema Due to ARF CAP ruled out -antibiotics discontinued  *Chronic worsening metastatic prostate cancer follow-up as outpatient for chemo/radiation  *Anemia of chronic disease Hemoglobin stable  All the records are reviewed and case discussed with Care Management/Social Workerr. Management plans discussed with the patient, family and they are in agreement.  CODE STATUS:full  TOTAL TIME TAKING CARE OF THIS PATIENT: 45 minutes.     POSSIBLE D/C IN 1-3 DAYS, DEPENDING ON CLINICAL CONDITION.   Avel Peace Talyn Eddie M.D on 11/28/2017   Between 7am to 6pm - Pager - [phone]  After 6pm go to [email or web address] - password [email or web address] [facility]  Office   [phone]  CC: Primary care physician; [name], MD  Note: This dictation was prepared with Dragon dictation along with smaller phrase technology. Any transcriptional errors that result from this process are unintentional.

## 2017-11-28 NOTE — Progress Notes (Signed)
Dr. Jerelyn Charles notified via text of resulted MRI. Awaiting call back. Madlyn Frankel, RN

## 2017-11-28 NOTE — Progress Notes (Signed)
PT Cancellation Note  Patient Details Name: [name] MRN: [id number] DOB: 1945-11-14   Cancelled Treatment:    Reason Eval/Treat Not Completed: Medical issues which prohibited therapy(Chart reviewed for re-attempt at evaluation.  Patient with significant agitation overnight, combative with staff.  Received significant medication to calm; currently resting calmly.  RN requests therapist hold at this time with re-attempt at later date.  Will continue to follow and initiate as appropriate.)  Merrill Deanda H. Owens Shark, PT, DPT, NCS 11/28/17, 10:24 AM [phone]

## 2017-11-28 NOTE — Progress Notes (Addendum)
SLP Cancellation Note  Patient Details Name: [name] MRN: [id number] DOB: 1945-05-27   Cancelled treatment:       Reason Eval/Treat Not Completed: Fatigue/lethargy limiting ability to participate;Medical issues which prohibited therapy;Patient not medically ready;Patient's level of consciousness(chart reviewed; consulted NSG, MD. ). Per NSG report, pt has been agitated hitting/kicking when more agitated. He has received Benadryl, Haldol, Ativan, Morphine and Geodon during the night/this morning. Currently, pt is lethargic; responds to some tactile stimulation w/ further agitation; combative when aroused per MD note. He is unable to follow commands or verbally respond to any basic questions. MD/Neurology following w/ MRI planned per report(if able). Bilateral mitts in place. Recommend pt remain NPO status d/t increased risk for aspiration/choking w/ any oral intake d/t AMS. NSG/MD agreed. ST services will f/u tomorrow.     Orinda Kenner, MS, CCC-SLP Watson,Katherine 11/28/2017, 3:04 PM

## 2017-11-29 ENCOUNTER — Ambulatory Visit: Payer: Medicare Other

## 2017-11-29 ENCOUNTER — Inpatient Hospital Stay (HOSPITAL_COMMUNITY)
Admit: 2017-11-29 | Discharge: 2017-11-29 | Disposition: A | Discharge status: Home / Self Care | Payer: Medicare Other | Attending: Family Medicine | Admitting: Family Medicine

## 2017-11-29 ENCOUNTER — Inpatient Hospital Stay: Payer: Medicare Other

## 2017-11-29 DIAGNOSIS — I5030 Unspecified diastolic (congestive) heart failure: Secondary | ICD-10-CM

## 2017-11-29 DIAGNOSIS — I639 Cerebral infarction, unspecified: Secondary | ICD-10-CM

## 2017-11-29 LAB — ECHOCARDIOGRAM COMPLETE
Height: 72 in
WEIGHTICAEL: 2816 [oz_av]

## 2017-11-29 LAB — HEMOGLOBIN AND HEMATOCRIT, BLOOD
HCT: 24 % — ABNORMAL LOW (ref 40.0–52.0)
HEMATOCRIT: 23.8 % — AB (ref 40.0–52.0)
HEMOGLOBIN: 8.2 g/dL — AB (ref 13.0–18.0)
Hemoglobin: 8.4 g/dL — ABNORMAL LOW (ref 13.0–18.0)

## 2017-11-29 LAB — BASIC METABOLIC PANEL
ANION GAP: 7 (ref 5–15)
BUN: 26 mg/dL — ABNORMAL HIGH (ref 8–23)
CO2: 21 mmol/L — ABNORMAL LOW (ref 22–32)
Calcium: 8.9 mg/dL (ref 8.9–10.3)
Chloride: 112 mmol/L — ABNORMAL HIGH (ref 98–111)
Creatinine, Ser: 1.83 mg/dL — ABNORMAL HIGH (ref 0.61–1.24)
GFR calc Af Amer: 41 mL/min — ABNORMAL LOW (ref >60)
GFR, EST NON AFRICAN AMERICAN: 36 mL/min — AB (ref >60)
Glucose, Bld: 106 mg/dL — ABNORMAL HIGH (ref 70–99)
POTASSIUM: 3.7 mmol/L (ref 3.5–5.1)
Sodium: 140 mmol/L (ref 135–145)

## 2017-11-29 MED ORDER — LORAZEPAM 2 MG/ML IJ SOLN
1.0000 mg | Freq: Once | INTRAMUSCULAR | Status: AC
  Administered 2017-11-30: 1 mg via INTRAVENOUS
  Filled 2017-11-29: qty 1

## 2017-11-29 MED ORDER — ATORVASTATIN CALCIUM 20 MG PO TABS
40.0000 mg | ORAL_TABLET | Freq: Every day | ORAL | Status: DC
  Administered 2017-11-29 – 2017-12-02 (×4): 40 mg via ORAL
  Filled 2017-11-29 (×5): qty 2

## 2017-11-29 MED ORDER — SODIUM CHLORIDE 0.9 % IV BOLUS
1000.0000 mL | Freq: Once | INTRAVENOUS | Status: AC
  Administered 2017-11-29: 15:00:00 1000 mL via INTRAVENOUS

## 2017-11-29 MED ORDER — DIPHENHYDRAMINE HCL 50 MG/ML IJ SOLN
12.5000 mg | Freq: Once | INTRAMUSCULAR | Status: AC
  Administered 2017-11-30: 12.5 mg via INTRAVENOUS
  Filled 2017-11-29: qty 0.25

## 2017-11-29 MED ORDER — ASPIRIN EC 81 MG PO TBEC
81.0000 mg | DELAYED_RELEASE_TABLET | Freq: Every day | ORAL | Status: DC
  Administered 2017-11-29 – 2017-12-02 (×4): 81 mg via ORAL
  Filled 2017-11-29 (×4): qty 1

## 2017-11-29 NOTE — Progress Notes (Signed)
Nutrition Follow-up  DOCUMENTATION CODES:   Severe malnutrition in context of chronic illness  INTERVENTION:   -Continue Nepro Shake po BID, each supplement provides 425 kcal and 19 grams protein -Continue MVI with minerals daily  NUTRITION DIAGNOSIS:   Severe Malnutrition related to cancer and cancer related treatments as evidenced by severe fat depletion, moderate muscle depletion.  Ongoing  GOAL:   Patient will meet greater than or equal to 90% of their needs  Progressing  MONITOR:   PO intake, Supplement acceptance, Labs, Weight trends, Skin, I & O's  REASON FOR ASSESSMENT:   Malnutrition Screening Tool    ASSESSMENT:   72 y.o. male with a known history of metastatic prostate cancer with bladder obstruction on chemo/radiation, presents with altered mental status, fever over the last day, increasing back pain over the last month, pain with urination, wife noticing decreased urine output, increasing lower extremity swelling, shortness of breath worse with ambulation. Pt found to have acute renal failure with bilateral hydronephrosis now s/p nephrostomy tube placement. Pt also with chest x-ray noted for edema/left pneumonia could not be excluded  9/5- s/p MRI of brain due to mental status changes 9/6- s/p BSE- advanced to dysphagia 3 diet  Reviewed I/O's: -439 ml x 24 hours and -3.0 L since admission  Pt sitting in recliner chair at time of visit. Pt very confused, asking for "Quita Skye" (his wife) and "Wells Guiles" (a friend). Pt states he "wants everything to be sped up so I can go home".   Spoke with pt sitter at bedside. She reports pt with confusion and mental status changes for the past several days. Pt was a lot more coherent this morning, but has since been mire confused this afternoon. Observed meal tray- pt consumed 100% of grilled cheese sandwich, 75% of tomato soup, and 100% of applesauce and tea. Sitter reports good tolerance of diet.   Discussed with pt importance  of good nutritional intake to promote healing. Will continue Nepro shakes.   Labs reviewed.  Diet Order:   Diet Order            DIET DYS 3 Room service appropriate? Yes with Assist; Fluid consistency: Thin  Diet effective now        Diet - low sodium heart healthy              EDUCATION NEEDS:   Education needs have been addressed  Skin:  Skin Assessment: Reviewed RN Assessment  Last BM:  11/22/17  Height:   Ht Readings from Last 1 Encounters:  11/23/17 6' (1.829 m)    Weight:   Wt Readings from Last 1 Encounters:  11/23/17 79.8 kg    Ideal Body Weight:  80.9 kg  BMI:  Body mass index is 23.87 kg/m.  Estimated Nutritional Needs:   Kcal:  2200-2500kcal/day   Protein:  120-136g/day   Fluid:  >2.2L/day     Taraji Mungo A. Jimmye Norman, RD, LDN, CDE Pager: [phone] After hours Pager: [phone]

## 2017-11-29 NOTE — Progress Notes (Signed)
[facility]  ROUNDING NOTE   Subjective:   Patient's mental status has improved significantly He sitting up in the chair with wife at bedside He is much more calm He was n.p.o. when seen awaiting speech therapy evaluation Serum creatinine about the same at 1.83  Objective:  Vital signs in last 24 hours:  Temp:  [98.1 F (36.7 C)-98.4 F (36.9 C)] 98.3 F (36.8 C) (09/06 0753) Pulse Rate:  [73-91] 73 (09/06 0753) Resp:  [18-20] 20 (09/06 0500) BP: (98-126)/(59-67) 98/63 (09/06 0753) SpO2:  [95 %-100 %] 100 % (09/06 0753)  Weight change:  Filed Weights   11/23/17 0840 11/23/17 1348  Weight: 84.3 kg 79.8 kg    Intake/Output: I/O last 3 completed shifts: In: 3596.6 [I.V.:3076.6; Other:520] Out: 2375 [Urine:2375]   Intake/Output this shift:  Total I/O In: -  Out: 400 [Urine:400]  Physical Exam: General: NAD, sitting up in chair  Head: Normocephalic, atraumatic. Dry oral mucosal membranes  Eyes: Anicteric,   Neck: Supple, trachea  Lungs:  Clear to auscultation  Heart: tachycardic  Abdomen:  Soft, nontender,   Extremities: no peripheral edema.  Neurologic:  Oriented to self, family, place  Skin: No acute lesions   GU Bilateral nephrostomy tubes    Basic Metabolic Panel: Recent Labs  Lab 11/25/17 0400 11/26/17 0359 11/27/17 0543 11/28/17 0922 11/29/17 0518  NA 145 148* 143 138 140  K 3.8 3.6 3.2* 3.6 3.7  CL 115* 112* 109 108 112*  CO2 20* 25 24 23  21*  GLUCOSE 133* 124* 124* 108* 106*  BUN 59* 40* 34* 29* 26*  CREATININE 4.10* 2.39* 2.19* 1.78* 1.83*  CALCIUM 9.0 9.0 9.0 8.7* 8.9  MG 1.6*  --  1.8  --   --   PHOS 4.1  --   --   --   --     Liver Function Tests: No results for input(s): AST, ALT, ALKPHOS, BILITOT, PROT, ALBUMIN in the last 168 hours. No results for input(s): LIPASE, AMYLASE in the last 168 hours. Recent Labs  Lab 11/27/17 1645  AMMONIA <9*    CBC: Recent Labs  Lab 11/23/17 0928 11/25/17 0400 11/27/17 0543  11/28/17 0922 11/28/17 1611 11/29/17 0016 11/29/17 1000  WBC 5.3 5.8  --   --   --   --   --   NEUTROABS 4.0  --   --   --   --   --   --   HGB 8.7* 7.9* 8.6* 7.9* 8.2* 8.4* 8.2*  HCT 25.5* 23.2*  --  22.8* 23.2* 23.8* 24.0*  MCV 89.2 88.9  --   --   --   --   --   PLT 236 218  --   --   --   --   --     Cardiac Enzymes: No results for input(s): CKTOTAL, CKMB, CKMBINDEX, TROPONINI in the last 168 hours.  BNP: Invalid input(s): POCBNP  CBG: No results for input(s): GLUCAP in the last 168 hours.  Microbiology: Results for orders placed or performed during the hospital encounter of 11/23/17  CULTURE, BLOOD (ROUTINE X 2) w Reflex to ID Panel     Status: None   Collection Time: 11/23/17  3:50 PM  Result Value Ref Range Status   Specimen Description BLOOD L HAND  Final   Special Requests   Final    BOTTLES DRAWN AEROBIC AND ANAEROBIC Blood Culture results may not be optimal due to an inadequate volume of blood received in culture bottles  Culture   Final    NO GROWTH 5 DAYS Performed at [facility], [address] [place], [place] [address]    Report Status 11/28/2017 FINAL  Final  CULTURE, BLOOD (ROUTINE X 2) w Reflex to ID Panel     Status: None   Collection Time: 11/23/17  3:50 PM  Result Value Ref Range Status   Specimen Description BLOOD R HAND  Final   Special Requests   Final    BOTTLES DRAWN AEROBIC AND ANAEROBIC Blood Culture results may not be optimal due to an inadequate volume of blood received in culture bottles   Culture   Final    NO GROWTH 5 DAYS Performed at [facility], [address], [place], [place] [address]    Report Status 11/28/2017 FINAL  Final    Coagulation Studies: No results for input(s): LABPROT, INR in the last 72 hours.  Urinalysis: No results for input(s): COLORURINE, LABSPEC, PHURINE, GLUCOSEU, HGBUR, BILIRUBINUR, KETONESUR, PROTEINUR, UROBILINOGEN, NITRITE, LEUKOCYTESUR in the last 72 hours.  Invalid input(s):  APPERANCEUR    Imaging: Mr Brain Wo Contrast  Result Date: 11/28/2017 CLINICAL DATA:  Altered mental status.  History of prostate cancer. EXAM: MRI HEAD WITHOUT CONTRAST TECHNIQUE: Sagittal T1, axial coronal diffusion weighted imaging and axial T2 FLAIR sequences obtained on a 1.5 tesla scanner. Examination was truncated as patient was unable to tolerate further imaging. COMPARISON:  None. FINDINGS: Moderately motion degraded examination. INTRACRANIAL CONTENTS: Subcentimeter reduced diffusion RIGHT frontal white matter/centrum semiovale with low ADC values. Patchy supratentorial white matter FLAIR T2 hyperintensities. The ventricles and sulci are normal for patient's age. No suspicious parenchymal signal, masses, mass effect. No abnormal extra-axial fluid collections. VASCULAR: Nondiagnostic assessment. SKULL AND UPPER CERVICAL SPINE: No abnormal sellar expansion. No suspicious calvarial bone marrow signal. Craniocervical junction maintained. SINUSES/ORBITS: Nondiagnostic assessment. OTHER: None. IMPRESSION: 1. 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 2. Mild-to-moderate chronic small vessel ischemic changes. Electronically Signed   By: Elon Alas M.D.   On: 11/28/2017 13:42   US Renal  Result Date: 11/27/2017 CLINICAL DATA:  Gross hematuria. Status post bilateral nephrostomy tubes. History of prostate carcinoma. EXAM: RENAL / URINARY TRACT ULTRASOUND COMPLETE COMPARISON:  CT, 11/23/2017 FINDINGS: Right Kidney: Length: 8.4 cm. Diffuse cortical thinning. No renal masses or convincing stones. No hydronephrosis. Left Kidney: Length: 10.5 cm. Suboptimal visualization. Normal overall parenchymal echogenicity. Lobulated contour. No convincing mass or stone. No visualized hydronephrosis. Bladder: Irregular posterior bladder mass measuring approximately 9 x 4 x 5 cm. Color Doppler blood flow is seen within the mass. IMPRESSION: 1. No sonographic evidence of  hydronephrosis. The significant hydroureteronephrosis noted on the prior CT is not evident sonographically. It is not clear whether the nephrostomy tubes remain in place, with tubes not visualized sonographically. 2. Irregular bladder mass measuring 9 cm in greatest dimension. This is consistent with prostate carcinoma invading the bladder, as noted on the current CT. Electronically Signed   By: Lajean Manes M.D.   On: 11/27/2017 15:38   US Carotid Bilateral  Result Date: 11/29/2017 CLINICAL DATA:  Stroke.  Previous tobacco abuse. EXAM: BILATERAL CAROTID DUPLEX ULTRASOUND TECHNIQUE: Pearline Cables scale imaging, color Doppler and duplex ultrasound were performed of bilateral carotid and vertebral arteries in the neck. COMPARISON:  None. FINDINGS: Criteria: Quantification of carotid stenosis is based on velocity parameters that correlate the residual internal carotid diameter with NASCET-based stenosis levels, using the diameter of the distal internal carotid lumen as the denominator  for stenosis measurement. The following velocity measurements were obtained: RIGHT ICA: 117/41 cm/sec CCA: 585/92 cm/sec SYSTOLIC ICA/CCA RATIO:  9.24 ECA: 105 cm/sec LEFT ICA: 105/36 cm/sec CCA: 462/86 cm/sec SYSTOLIC ICA/CCA RATIO:  0.7 ECA: 225 cm/sec RIGHT CAROTID ARTERY: Mild plaque in the carotid bulb. No significant stenosis. Normal waveforms and color Doppler signal. RIGHT VERTEBRAL ARTERY:  Normal flow direction and waveform. LEFT CAROTID ARTERY: Mild partially calcified plaque in the bulb at the ICA origin. No high-grade stenosis. Normal waveforms and color Doppler signal. LEFT VERTEBRAL ARTERY:  Normal flow direction and waveform. IMPRESSION: 1. Bilateral carotid bifurcation plaque resulting in less than 50% diameter stenosis. 2.  Antegrade bilateral vertebral arterial flow. Electronically Signed   By: Lucrezia Europe M.D.   On: 11/29/2017 11:02     Medications:   . sodium chloride    . dextrose 5 % and 0.45% NaCl 75 mL/hr at  11/29/17 0541  . sodium chloride     . aspirin EC  81 mg Oral Daily  . atorvastatin  40 mg Oral q1800  . feeding supplement (NEPRO CARB STEADY)  237 mL Oral BID BM  . megestrol  400 mg Oral BID  . multivitamin with minerals  1 tablet Oral Daily  . sodium chloride flush  10 mL Per Tube Q12H   sodium chloride, acetaminophen **OR** acetaminophen, HYDROcodone-acetaminophen, iopamidol, loratadine, morphine injection, ondansetron **OR** ondansetron (ZOFRAN) IV, polyethylene glycol, sodium chloride flush, ziprasidone  Assessment/ Plan:  [name] is a 72 y.o. white male with prostate cancer who was admitted to [facility] on 11/23/2017. Nephrostomy tubes bilaterally placed by IR on 8/31.   1. Acute renal failure: baseline creatinine of 1.19, GFR >60 on 09/16/17 2. Obstructive Nephropathy 3. Hyperkalemia- now hypokalemia 4. Metabolic acidosis 5. Hypernatremia 6. 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 Renal function improving status post bilateral nephrostomy tubes placed on 8/31.Right nephrostomy with blood tinged urine and UOP of small amount. Will need to be evaluated by VIR Continue D5W + half-normal saline Mental status has improved significantly today Serum creatinine appears to be stabilizing at 1.8 MRI of the brain shows acute subcentimeter right frontal white matter infarct   LOS: 6 [name] 9/6/20191:33 PM

## 2017-11-29 NOTE — Evaluation (Signed)
Physical Therapy Evaluation Patient Details Name: [name] MRN: [id number] DOB: 23-Sep-1945 Today's Date: 11/29/2017   History of Present Illness  presented to ER secondary to back pain; admitted with acute renal failure secondary to acute bilat hydronephrosis.  Status post bilat nephrostomy tube placement. Hospital course complicated by periods of agitation and combativeness prompting neuro evaluation.  MRI significant for L frontal infarct.  Clinical Impression  Upon evaluation, patient alert and oriented to basic information; noted delay in processing, task initiation and overall insight/safety awareness.  Bilat UE/LE generally weak and deconditioned, though no focal weakness, sensory deficit evident; does show mild ataxia bilat UEs with open-chain activities.  Currently requiring min assist for bed mobility; mod assist +1-2 for sit/stand, basic transfers and short-distance gait with RW (25').  Constant assist for standing balance and walker position/management; consistently lists forward and to the left during gait trials.  Very limited insight into deficits and performance.  High risk for falls without constant hands-on assist. Would benefit from skilled PT to address above deficits and promote optimal return to PLOF; recommend transition to STR upon discharge from acute hospitalization.     Follow Up Recommendations SNF    Equipment Recommendations       Recommendations for Other Services       Precautions / Restrictions Precautions Precautions: Fall Precaution Comments: bilat nephrostomy tubes Restrictions Weight Bearing Restrictions: No      Mobility  Bed Mobility Overal bed mobility: Needs Assistance Bed Mobility: Supine to Sit     Supine to sit: Min assist     General bed mobility comments: impulsive, min assist for line management, overall safety  Transfers Overall transfer level: Needs assistance Equipment used: Rolling walker (2 wheeled) Transfers: Sit  to/from Stand Sit to Stand: Min assist;Mod assist         General transfer comment: hand-over-hand placement, step by step verbal cuing for task sequencing and initiation  Ambulation/Gait Ambulation/Gait assistance: Mod assist;+2 safety/equipment Gait Distance (Feet): 25 Feet Assistive device: Rolling walker (2 wheeled)       General Gait Details: forward flexed posture, maintains RW arms length anterior to patient.  Poor standing balance and postural control.  Lists anteriorally, requirnig mod assist from therapist to block/stabilize RW.  Shuffling steps with inconsistent LE foot placement  Stairs            Wheelchair Mobility    Modified Rankin (Stroke Patients Only)       Balance Overall balance assessment: Needs assistance Sitting-balance support: No upper extremity supported;Feet supported Sitting balance-Leahy Scale: Fair     Standing balance support: Bilateral upper extremity supported Standing balance-Leahy Scale: Poor                               Pertinent Vitals/Pain Pain Assessment: No/denies pain    Home Living Family/patient expects to be discharged to:: Private residence Living Arrangements: Spouse/significant other Available Help at Discharge: Family Type of Home: House Home Access: Stairs to enter Entrance Stairs-Rails: None Entrance Stairs-Number of Steps: 2 Home Layout: Two level;Bed/bath upstairs Home Equipment: None      Prior Function Level of Independence: Independent         Comments: Indep with ADLs, household and community mobilization without assist device. Just completed round of chemo/radiation; planning second round this week (currently deferred due to medical situation)     Hand Dominance   Dominant Hand: Right    Extremity/Trunk Assessment  Upper Extremity Assessment Upper Extremity Assessment: Generalized weakness(grossly 4-/5; denies sensory deficit. Mild ataxia and decreased coordination; delayed  motor planning)    Lower Extremity Assessment Lower Extremity Assessment: Generalized weakness(grossly at least 4-/5 throughout; denies sensory deficit)    Cervical / Trunk Assessment Cervical / Trunk Assessment: Kyphotic(scoliotic curvature towards R)  Communication   Communication: No difficulties  Cognition Arousal/Alertness: Awake/alert Behavior During Therapy: WFL for tasks assessed/performed Overall Cognitive Status: Impaired/Different from baseline                                 General Comments: oriented to self, location and general situation; confused as to details.  Limited insight into deficits and need for assist.  Slightly impulsive.      General Comments      Exercises     Assessment/Plan    PT Assessment Patient needs continued PT services  PT Problem List Decreased strength;Decreased range of motion;Decreased activity tolerance;Decreased balance;Decreased mobility;Decreased coordination;Decreased knowledge of use of DME;Decreased safety awareness;Decreased knowledge of precautions;Decreased cognition       PT Treatment Interventions DME instruction;Stair training;Functional mobility training;Gait training;Therapeutic activities;Therapeutic exercise;Balance training;Neuromuscular re-education;Patient/family education    PT Goals (Current goals can be found in the Care Plan section)  Acute Rehab PT Goals Patient Stated Goal: per wife, to walk around a little bit; interested in pursing STR (prefers to stay local in [place]) PT Goal Formulation: With patient/family Time For Goal Achievement: 12/13/17 Potential to Achieve Goals: Good    Frequency 7X/week   Barriers to discharge Decreased caregiver support      Co-evaluation               AM-PAC PT "6 Clicks" Daily Activity  Outcome Measure Difficulty turning over in bed (including adjusting bedclothes, sheets and blankets)?: Unable Difficulty moving from lying on back to sitting  on the side of the bed? : Unable Difficulty sitting down on and standing up from a chair with arms (e.g., wheelchair, bedside commode, etc,.)?: Unable Help needed moving to and from a bed to chair (including a wheelchair)?: A Lot Help needed walking in hospital room?: A Lot Help needed climbing 3-5 steps with a railing? : Total 6 Click Score: 8    End of Session Equipment Utilized During Treatment: Gait belt Activity Tolerance: Patient tolerated treatment well Patient left: in chair;with call bell/phone within reach;with nursing/sitter in room;with family/visitor present(sitter at bedside) Nurse Communication: Mobility status PT Visit Diagnosis: Unsteadiness on feet (R26.81);Muscle weakness (generalized) (M62.81);Difficulty in walking, not elsewhere classified (R26.2);Hemiplegia and hemiparesis Hemiplegia - Right/Left: Left Hemiplegia - dominant/non-dominant: Non-dominant Hemiplegia - caused by: Cerebral infarction    Time: 1324-4010 PT Time Calculation (min) (ACUTE ONLY): 30 min   Charges:   PT Evaluation $PT Eval Moderate Complexity: 1 Mod PT Treatments $Gait Training: 8-22 mins        Lafalce H. Owens Shark, PT, DPT, NCS 11/29/17, 4:03 PM [phone]

## 2017-11-29 NOTE — Progress Notes (Signed)
Family Meeting Note  Advance Directive:yes  Today a meeting took place with the Patient, wife.  Patient is able to participate   The following clinical team members were present during this meeting:MD  The following were discussed:Patient's diagnosis: cva, Patient's progosis: Unable to determine and Goals for treatment: Full Code  Additional follow-up to be provided: prn  Time spent during discussion:20 minutes  Gorden Harms, MD

## 2017-11-29 NOTE — Evaluation (Signed)
Clinical/Bedside Swallow Evaluation Patient Details  Name: [name] MRN: [id number] Date of Birth: 06-12-1945  Today's Date: 11/29/2017 Time: SLP Start Time (ACUTE ONLY): 1150 SLP Stop Time (ACUTE ONLY): 1250 SLP Time Calculation (min) (ACUTE ONLY): 60 min  Past Medical History:  Past Medical History:  Diagnosis Date  . Diverticulosis of colon   . Hx of colonic polyps   . Prostate cancer [facility])    Past Surgical History:  Past Surgical History:  Procedure Laterality Date  . CARDIOVASCULAR STRESS TEST  1996   Negative  . IR FLUORO GUIDE PORT INSERTION RIGHT  01/09/2017  . IR NEPHROSTOGRAM LEFT INITIAL PLACEMENT  11/23/2017  . IR US GUIDE VASC ACCESS RIGHT  01/09/2017  . PORTACATH PLACEMENT     HPI:  Pt is a 72 y/o male w/ PMH including prostate CA who presented to ER secondary to back pain; worsening metastatic prostate cancer with bladder obstruction on chemo/radiation. He admitted with acute renal failure secondary to acute bilat hydronephrosis.  Status post bilat nephrostomy tube placement. Hospital course complicated by periods of agitation and combativeness prompting neuro evaluation.  MRI significant for L frontal infarct at this time.  Today, pt is the most alert and able to engage and participate that he has been in the past 3-4 days.  A Sitter is present in room.    Assessment / Plan / Recommendation Clinical Impression  Pt appears to present w/ adequate oropharyngeal phase swallowing function w/ no immediate, overt s/s of aspiration noted w/ po trials at this evaluation. Pt appears at reduced risk for aspiration when following aspiration precautions, although, d/t recent CVA per MRI and pt's declined Cognitive status, risk for dysphagia and aspiration could be increased. Pt consumed po trials of thin liquids via Cup then purees and soft solids w/ no immediate coughing, decline in respiratory presentation/effort, and no decline in vocal quality. Oral phase was grossly [facility] for  bolus management and oral clearing. Pt helped to feed himself but needed support and cues/direction at times; impulsive at times. Pt also exhibited shaky UEs and did not appear to have sufficient control during self-feeding tasks. OM exam appeared [facility] w/ no unilateral weakness noted. Pt's speech was slightly mumbled but clear, intelligible in his responses. Pt required cues for follow through at times. Recommend a slightly modified diet for easier oral intake; conservation of energy; monitoring at meals for support. Recommend aspiration precautions d/t Cognitive status/decline; pills in Puree for safer swallowing at this time. Of note, Cognitive status appears declined w/ presentation of delay in processing, task initiation and overall insight/safety awareness. Pt required min-mod verbal cues during tasks; impulsivity noted. MRI revealed a L frontal CVA; a Cogntive-linguistic assessment would be warranted.  SLP Visit Diagnosis: Dysphagia, unspecified (R13.10)(suspect impact from mental status, new CVA)    Aspiration Risk  Mild aspiration risk(but reduced following aspiration precautions)    Diet Recommendation  Dysphagia level 3 w/ Thin liquids; aspiration precautions; monitoring and reducing distractions during meals. Support w/ feeding as needed.   Medication Administration: Whole meds with puree(for safer swallowing)    Other  Recommendations Recommended Consults: (Dietician f/u) Oral Care Recommendations: Oral care BID;Staff/trained caregiver to provide oral care Other Recommendations: (n/a)   Follow up Recommendations (TBD)      Frequency and Duration min 2x/week  1 week       Prognosis Prognosis for Safe Diet Advancement: Fair(-Good) Barriers to Reach Goals: Cognitive deficits      Swallow Study   General Date of  Onset: 11/23/17 HPI: Pt is a 72 y/o male w/ PMH including prostate CA who presented to ER secondary to back pain; worsening metastatic prostate cancer with bladder  obstruction on chemo/radiation. He admitted with acute renal failure secondary to acute bilat hydronephrosis.  Status post bilat nephrostomy tube placement. Hospital course complicated by periods of agitation and combativeness prompting neuro evaluation.  MRI significant for L frontal infarct at this time.  Today, pt is the most alert and able to engage and participate that he has been in the past 3-4 days.  A Sitter is present in room.  Type of Study: Bedside Swallow Evaluation Previous Swallow Assessment: none reported Diet Prior to this Study: NPO(regular diet prior) Temperature Spikes Noted: No(wbc 5.8) Respiratory Status: Room air History of Recent Intubation: No Behavior/Cognition: Alert;Cooperative;Pleasant mood;Distractible;Requires cueing;Confused(much improved since past 2-3 days) Oral Cavity Assessment: Dry(sticky; min coating on mid-tongue) Oral Care Completed by SLP: Recent completion by staff Oral Cavity - Dentition: Adequate natural dentition Vision: Functional for self-feeding(it appeared) Self-Feeding Abilities: Able to feed self;Needs assist;Needs set up Patient Positioning: Upright in chair Baseline Vocal Quality: Normal Volitional Cough: Strong Volitional Swallow: Able to elicit    Oral/Motor/Sensory Function Overall Oral Motor/Sensory Function: Within functional limits(appeared grossly so)   Ice Chips Ice chips: Within functional limits Presentation: Spoon(fed; 3 trials)   Thin Liquid Thin Liquid: Impaired Presentation: Cup;Self Fed(~5-6 ozs total w/ multiple sips at one time) Oral Phase Impairments: (none) Oral Phase Functional Implications: (none) Pharyngeal  Phase Impairments: Throat Clearing - Delayed(x1) Other Comments: pt took a few impulsive sips from cup; distracted easily    Nectar Thick Nectar Thick Liquid: Not tested   Honey Thick Honey Thick Liquid: Not tested   Puree Puree: Within functional limits Presentation: Self Fed;Spoon(assisted; ~4 ozs) Other  Comments: distracted easily   Solid     Solid: Within functional limits(grossly) Presentation: Self Fed(7-8 graham cracker trials) Other Comments: tended to be shaky in self-feeding; distracted easily       Orinda Kenner, MS, CCC-SLP Shanti Agresti 11/29/2017,4:29 PM

## 2017-11-29 NOTE — Progress Notes (Signed)
[facility] [place] NAME: [name]    MR#:  [id number]  DATE OF BIRTH:  1945/12/16  SUBJECTIVE:  CHIEF COMPLAINT:   Chief Complaint  Patient presents with  . Flank Pain  . Back Pain  . Altered Mental Status  Patient much more awake/alert, able to have a conversation with the patient, wife at the bedside, acute stroke findings shared with family/patient with all questions answered, neurology, nephrology input appreciated  REVIEW OF SYSTEMS:  CONSTITUTIONAL: No fever, fatigue or weakness.  EYES: No blurred or double vision.  EARS, NOSE, AND THROAT: No tinnitus or ear pain.  RESPIRATORY: No cough, shortness of breath, wheezing or hemoptysis.  CARDIOVASCULAR: No chest pain, orthopnea, edema.  GASTROINTESTINAL: No nausea, vomiting, diarrhea or abdominal pain.  GENITOURINARY: No dysuria, hematuria.  ENDOCRINE: No polyuria, nocturia,  HEMATOLOGY: No anemia, easy bruising or bleeding SKIN: No rash or lesion. MUSCULOSKELETAL: No joint pain or arthritis.   NEUROLOGIC: No tingling, numbness, weakness.  PSYCHIATRY: No anxiety or depression.   ROS  DRUG ALLERGIES:   Allergies  Allergen Reactions  . Versed [Midazolam] Other (See Comments)    agitation    VITALS:  Blood pressure 98/63, pulse 73, temperature 98.3 F (36.8 C), temperature source Oral, resp. rate 20, height 6' (1.829 m), weight 79.8 kg, SpO2 100 %.  PHYSICAL EXAMINATION:  GENERAL:  72 y.o.-year-old patient lying in the bed with no acute distress.  EYES: Pupils equal, round, reactive to light and accommodation. No scleral icterus. Extraocular muscles intact.  HEENT: Head atraumatic, normocephalic. Oropharynx and nasopharynx clear.  NECK:  Supple, no jugular venous distention. No thyroid enlargement, no tenderness.  LUNGS: Normal breath sounds bilaterally, no wheezing, rales,rhonchi or crepitation. No use of accessory muscles of respiration.  CARDIOVASCULAR: S1, S2 normal.  No murmurs, rubs, or gallops.  ABDOMEN: Soft, nontender, nondistended. Bowel sounds present. No organomegaly or mass.  EXTREMITIES: No pedal edema, cyanosis, or clubbing.  NEUROLOGIC: Cranial nerves II through XII are intact. Muscle strength 5/5 in all extremities. Sensation intact. Gait not checked.  PSYCHIATRIC: The patient is alert and oriented x 3.  SKIN: No obvious rash, lesion, or ulcer.   Physical Exam LABORATORY PANEL:   CBC Recent Labs  Lab 11/25/17 0400  11/29/17 1000  WBC 5.8  --   --   HGB 7.9*   < > 8.2*  HCT 23.2*   < > 24.0*  PLT 218  --   --    < > = values in this interval not displayed.   ------------------------------------------------------------------------------------------------------------------  Chemistries  Recent Labs  Lab 11/27/17 0543  11/29/17 0518  NA 143   < > 140  K 3.2*   < > 3.7  CL 109   < > 112*  CO2 24   < > 21*  GLUCOSE 124*   < > 106*  BUN 34*   < > 26*  CREATININE 2.19*   < > 1.83*  CALCIUM 9.0   < > 8.9  MG 1.8  --   --    < > = values in this interval not displayed.   ------------------------------------------------------------------------------------------------------------------  Cardiac Enzymes No results for input(s): TROPONINI in the last 168 hours. ------------------------------------------------------------------------------------------------------------------  RADIOLOGY:  Mr Brain Wo Contrast  Result Date: 11/28/2017 CLINICAL DATA:  Altered mental status.  History of prostate cancer. EXAM: MRI HEAD WITHOUT CONTRAST TECHNIQUE: Sagittal T1, axial coronal diffusion weighted imaging and axial T2 FLAIR sequences obtained on a 1.5 tesla scanner.  Examination was truncated as patient was unable to tolerate further imaging. COMPARISON:  None. FINDINGS: Moderately motion degraded examination. INTRACRANIAL CONTENTS: Subcentimeter reduced diffusion RIGHT frontal white matter/centrum semiovale with low ADC values. Patchy  supratentorial white matter FLAIR T2 hyperintensities. The ventricles and sulci are normal for patient's age. No suspicious parenchymal signal, masses, mass effect. No abnormal extra-axial fluid collections. VASCULAR: Nondiagnostic assessment. SKULL AND UPPER CERVICAL SPINE: No abnormal sellar expansion. No suspicious calvarial bone marrow signal. Craniocervical junction maintained. SINUSES/ORBITS: Nondiagnostic assessment. OTHER: None. IMPRESSION: 1. 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 2. Mild-to-moderate chronic small vessel ischemic changes. Electronically Signed   By: Elon Alas M.D.   On: 11/28/2017 13:42   US Renal  Result Date: 11/27/2017 CLINICAL DATA:  Gross hematuria. Status post bilateral nephrostomy tubes. History of prostate carcinoma. EXAM: RENAL / URINARY TRACT ULTRASOUND COMPLETE COMPARISON:  CT, 11/23/2017 FINDINGS: Right Kidney: Length: 8.4 cm. Diffuse cortical thinning. No renal masses or convincing stones. No hydronephrosis. Left Kidney: Length: 10.5 cm. Suboptimal visualization. Normal overall parenchymal echogenicity. Lobulated contour. No convincing mass or stone. No visualized hydronephrosis. Bladder: Irregular posterior bladder mass measuring approximately 9 x 4 x 5 cm. Color Doppler blood flow is seen within the mass. IMPRESSION: 1. No sonographic evidence of hydronephrosis. The significant hydroureteronephrosis noted on the prior CT is not evident sonographically. It is not clear whether the nephrostomy tubes remain in place, with tubes not visualized sonographically. 2. Irregular bladder mass measuring 9 cm in greatest dimension. This is consistent with prostate carcinoma invading the bladder, as noted on the current CT. Electronically Signed   By: Lajean Manes M.D.   On: 11/27/2017 15:38   US Carotid Bilateral  Result Date: 11/29/2017 CLINICAL DATA:  Stroke.  Previous tobacco abuse. EXAM: BILATERAL CAROTID DUPLEX  ULTRASOUND TECHNIQUE: Pearline Cables scale imaging, color Doppler and duplex ultrasound were performed of bilateral carotid and vertebral arteries in the neck. COMPARISON:  None. FINDINGS: Criteria: Quantification of carotid stenosis is based on velocity parameters that correlate the residual internal carotid diameter with NASCET-based stenosis levels, using the diameter of the distal internal carotid lumen as the denominator for stenosis measurement. The following velocity measurements were obtained: RIGHT ICA: 117/41 cm/sec CCA: 119/14 cm/sec SYSTOLIC ICA/CCA RATIO:  7.82 ECA: 105 cm/sec LEFT ICA: 105/36 cm/sec CCA: 956/21 cm/sec SYSTOLIC ICA/CCA RATIO:  0.7 ECA: 225 cm/sec RIGHT CAROTID ARTERY: Mild plaque in the carotid bulb. No significant stenosis. Normal waveforms and color Doppler signal. RIGHT VERTEBRAL ARTERY:  Normal flow direction and waveform. LEFT CAROTID ARTERY: Mild partially calcified plaque in the bulb at the ICA origin. No high-grade stenosis. Normal waveforms and color Doppler signal. LEFT VERTEBRAL ARTERY:  Normal flow direction and waveform. IMPRESSION: 1. Bilateral carotid bifurcation plaque resulting in less than 50% diameter stenosis. 2.  Antegrade bilateral vertebral arterial flow. Electronically Signed   By: Lucrezia Europe M.D.   On: 11/29/2017 11:02    ASSESSMENT AND PLAN:  *Acute right CVA Stroke protocol, follow-up on echocardiogram, carotid Dopplers, neurology input appreciated, aspirin, statin therapy, speech therapy/physical therapy to evaluate/treat  *Acute renal failure Resolving Due tob/lhydronephrosisdue toobstructive uropathy-prostate cancer/mass Continue IV fluids for rehydration, avoid nephrotoxic agents, renal ultrasound negative for hydronephrosis Nephrology input appreciated  *Acute bilateral hydronephrosis  Resolved Due to obstructive uropathy-prostate cancer/mass  S/p bilateral nephrostomy tube placementby IR Urology and nephrology input appreciated   *Acute  gross right hematuria Resolving Renal ultrasound negative for hydronephrosis  *Acutehypokalemia  Repleted  *Acute  pulmonary edema Due to ARF CAP ruled out -antibiotics discontinued  *Chronic worsening metastatic prostate cancer follow-up as outpatient for chemo/radiation  *Anemia of chronic disease Hemoglobinstable  All the records are reviewed and case discussed with Care Management/Social Workerr. Management plans discussed with the patient, family and they are in agreement.  CODE STATUS: full  TOTAL TIME TAKING CARE OF THIS PATIENT: 45 minutes.     POSSIBLE D/C IN 1-3 DAYS, DEPENDING ON CLINICAL CONDITION.   Avel Peace Salary M.D on 11/29/2017   Between 7am to 6pm - Pager - [phone]  After 6pm go to [email or web address] - password [email or web address] [facility]  Office  [phone]  CC: Primary care physician; [name], MD  Note: This dictation was prepared with Dragon dictation along with smaller phrase technology. Any transcriptional errors that result from this process are unintentional.

## 2017-11-29 NOTE — Progress Notes (Addendum)
Subjective: Patient much improved today.  At bedside feeding himself.  Cooperative  Objective: Current vital signs: BP 98/63 (BP Location: Left Arm)   Pulse 73   Temp 98.3 F (36.8 C) (Oral)   Resp 20   Ht 6' (1.829 m)   Wt 79.8 kg   SpO2 100%   BMI 23.87 kg/m  Vital signs in last 24 hours: Temp:  [98.1 F (36.7 C)-98.4 F (36.9 C)] 98.3 F (36.8 C) (09/06 0753) Pulse Rate:  [73-91] 73 (09/06 0753) Resp:  [18-20] 20 (09/06 0500) BP: (98-126)/(59-67) 98/63 (09/06 0753) SpO2:  [95 %-100 %] 100 % (09/06 0753)  Intake/Output from previous day: 09/05 0701 - 09/06 0700 In: 1686.5 [I.V.:1666.5] Out: 2125 [Urine:2125] Intake/Output this shift: Total I/O In: -  Out: 400 [Urine:400] Nutritional status:  Diet Order            DIET DYS 3 Room service appropriate? Yes with Assist; Fluid consistency: Thin  Diet effective now        Diet - low sodium heart healthy              Neurologic Exam: Mental Status: Alert.  Speech fluent.  Follows commands.   Cranial Nerves: II: Discs flat bilaterally; Visual fields grossly normal, pupils equal, round, reactive to light and accommodation III,IV, VI: ptosis not present, extra-ocular motions intact bilaterally V,VII: smile symmetric, facial light touch sensation normal bilaterally VIII: hearing normal bilaterally IX,X: gag reflex present XI: bilateral shoulder shrug XII: midline tongue extension Motor: Moves all extremities against gravity   Lab Results: Basic Metabolic Panel: Recent Labs  Lab 11/25/17 0400 11/26/17 0359 11/27/17 0543 11/28/17 0922 11/29/17 0518  NA 145 148* 143 138 140  K 3.8 3.6 3.2* 3.6 3.7  CL 115* 112* 109 108 112*  CO2 20* 25 24 23  21*  GLUCOSE 133* 124* 124* 108* 106*  BUN 59* 40* 34* 29* 26*  CREATININE 4.10* 2.39* 2.19* 1.78* 1.83*  CALCIUM 9.0 9.0 9.0 8.7* 8.9  MG 1.6*  --  1.8  --   --   PHOS 4.1  --   --   --   --     Liver Function Tests: No results for input(s): AST, ALT,  ALKPHOS, BILITOT, PROT, ALBUMIN in the last 168 hours. No results for input(s): LIPASE, AMYLASE in the last 168 hours. Recent Labs  Lab 11/27/17 1645  AMMONIA <9*    CBC: Recent Labs  Lab 11/23/17 0928 11/25/17 0400 11/27/17 0543 11/28/17 0922 11/28/17 1611 11/29/17 0016 11/29/17 1000  WBC 5.3 5.8  --   --   --   --   --   NEUTROABS 4.0  --   --   --   --   --   --   HGB 8.7* 7.9* 8.6* 7.9* 8.2* 8.4* 8.2*  HCT 25.5* 23.2*  --  22.8* 23.2* 23.8* 24.0*  MCV 89.2 88.9  --   --   --   --   --   PLT 236 218  --   --   --   --   --     Cardiac Enzymes: No results for input(s): CKTOTAL, CKMB, CKMBINDEX, TROPONINI in the last 168 hours.  Lipid Panel: No results for input(s): CHOL, TRIG, HDL, CHOLHDL, VLDL, LDLCALC in the last 168 hours.  CBG: No results for input(s): GLUCAP in the last 168 hours.  Microbiology: Results for orders placed or performed during the hospital encounter of 11/23/17  CULTURE, BLOOD (ROUTINE X 2) w  Reflex to ID Panel     Status: None   Collection Time: 11/23/17  3:50 PM  Result Value Ref Range Status   Specimen Description BLOOD L HAND  Final   Special Requests   Final    BOTTLES DRAWN AEROBIC AND ANAEROBIC Blood Culture results may not be optimal due to an inadequate volume of blood received in culture bottles   Culture   Final    NO GROWTH 5 DAYS Performed at [facility], [address] [place], [place] [address]    Report Status 11/28/2017 FINAL  Final  CULTURE, BLOOD (ROUTINE X 2) w Reflex to ID Panel     Status: None   Collection Time: 11/23/17  3:50 PM  Result Value Ref Range Status   Specimen Description BLOOD R HAND  Final   Special Requests   Final    BOTTLES DRAWN AEROBIC AND ANAEROBIC Blood Culture results may not be optimal due to an inadequate volume of blood received in culture bottles   Culture   Final    NO GROWTH 5 DAYS Performed at [facility], [address] [place], [place] [address]    Report Status  11/28/2017 FINAL  Final    Coagulation Studies: No results for input(s): LABPROT, INR in the last 72 hours.  Imaging: Mr Brain Wo Contrast  Result Date: 11/28/2017 CLINICAL DATA:  Altered mental status.  History of prostate cancer. EXAM: MRI HEAD WITHOUT CONTRAST TECHNIQUE: Sagittal T1, axial coronal diffusion weighted imaging and axial T2 FLAIR sequences obtained on a 1.5 tesla scanner. Examination was truncated as patient was unable to tolerate further imaging. COMPARISON:  None. FINDINGS: Moderately motion degraded examination. INTRACRANIAL CONTENTS: Subcentimeter reduced diffusion RIGHT frontal white matter/centrum semiovale with low ADC values. Patchy supratentorial white matter FLAIR T2 hyperintensities. The ventricles and sulci are normal for patient's age. No suspicious parenchymal signal, masses, mass effect. No abnormal extra-axial fluid collections. VASCULAR: Nondiagnostic assessment. SKULL AND UPPER CERVICAL SPINE: No abnormal sellar expansion. No suspicious calvarial bone marrow signal. Craniocervical junction maintained. SINUSES/ORBITS: Nondiagnostic assessment. OTHER: None. IMPRESSION: 1. 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 2. Mild-to-moderate chronic small vessel ischemic changes. Electronically Signed   By: Elon Alas M.D.   On: 11/28/2017 13:42   US Renal  Result Date: 11/27/2017 CLINICAL DATA:  Gross hematuria. Status post bilateral nephrostomy tubes. History of prostate carcinoma. EXAM: RENAL / URINARY TRACT ULTRASOUND COMPLETE COMPARISON:  CT, 11/23/2017 FINDINGS: Right Kidney: Length: 8.4 cm. Diffuse cortical thinning. No renal masses or convincing stones. No hydronephrosis. Left Kidney: Length: 10.5 cm. Suboptimal visualization. Normal overall parenchymal echogenicity. Lobulated contour. No convincing mass or stone. No visualized hydronephrosis. Bladder: Irregular posterior bladder mass measuring approximately 9 x 4 x 5  cm. Color Doppler blood flow is seen within the mass. IMPRESSION: 1. No sonographic evidence of hydronephrosis. The significant hydroureteronephrosis noted on the prior CT is not evident sonographically. It is not clear whether the nephrostomy tubes remain in place, with tubes not visualized sonographically. 2. Irregular bladder mass measuring 9 cm in greatest dimension. This is consistent with prostate carcinoma invading the bladder, as noted on the current CT. Electronically Signed   By: Lajean Manes M.D.   On: 11/27/2017 15:38   US Carotid Bilateral  Result Date: 11/29/2017 CLINICAL DATA:  Stroke.  Previous tobacco abuse. EXAM: BILATERAL CAROTID DUPLEX ULTRASOUND TECHNIQUE: Pearline Cables scale imaging, color Doppler and duplex ultrasound were performed of bilateral carotid and vertebral arteries  in the neck. COMPARISON:  None. FINDINGS: Criteria: Quantification of carotid stenosis is based on velocity parameters that correlate the residual internal carotid diameter with NASCET-based stenosis levels, using the diameter of the distal internal carotid lumen as the denominator for stenosis measurement. The following velocity measurements were obtained: RIGHT ICA: 117/41 cm/sec CCA: 415/83 cm/sec SYSTOLIC ICA/CCA RATIO:  0.94 ECA: 105 cm/sec LEFT ICA: 105/36 cm/sec CCA: 076/80 cm/sec SYSTOLIC ICA/CCA RATIO:  0.7 ECA: 225 cm/sec RIGHT CAROTID ARTERY: Mild plaque in the carotid bulb. No significant stenosis. Normal waveforms and color Doppler signal. RIGHT VERTEBRAL ARTERY:  Normal flow direction and waveform. LEFT CAROTID ARTERY: Mild partially calcified plaque in the bulb at the ICA origin. No high-grade stenosis. Normal waveforms and color Doppler signal. LEFT VERTEBRAL ARTERY:  Normal flow direction and waveform. IMPRESSION: 1. Bilateral carotid bifurcation plaque resulting in less than 50% diameter stenosis. 2.  Antegrade bilateral vertebral arterial flow. Electronically Signed   By: Lucrezia Europe M.D.   On: 11/29/2017  11:02    Medications:  I have reviewed the patient's current medications. Scheduled: . aspirin EC  81 mg Oral Daily  . atorvastatin  40 mg Oral q1800  . feeding supplement (NEPRO CARB STEADY)  237 mL Oral BID BM  . megestrol  400 mg Oral BID  . multivitamin with minerals  1 tablet Oral Daily  . sodium chloride flush  10 mL Per Tube Q12H    Assessment/Plan: Patient improved.  MRI of the brain reviewed and shows an acute, small, right frontal white matter infarct.  Likely secondary to small vessel disease.  This is not likely the cause of the patient's mental status changes.  Etiology continues to likely be secondary to metabolic issues that have improved.  Patient continued on ASA and statin initiated.  Carotid dopplers show no evidence of hemodynamically significant stenosis.  Echocardiogram pending.  A1c pending, LDL pending.  Recommendations: 1.  Would replace ASA with Plavix 75mg  daily 2.  Continue statin 3.  Continue therapy   LOS: 6 days   Alexis Goodell, MD Neurology [phone] 11/29/2017  3:07 PM

## 2017-11-29 NOTE — Progress Notes (Signed)
*  PRELIMINARY RESULTS* Echocardiogram 2D Echocardiogram has been performed.  [name] 11/29/2017, 4:08 PM

## 2017-11-30 LAB — LIPID PANEL
CHOL/HDL RATIO: 3.4 ratio
Cholesterol: 100 mg/dL (ref 0–200)
HDL: 29 mg/dL — AB (ref >40)
LDL CALC: 58 mg/dL (ref 0–99)
Triglycerides: 63 mg/dL (ref <150)
VLDL: 13 mg/dL (ref 0–40)

## 2017-11-30 LAB — BASIC METABOLIC PANEL
ANION GAP: 7 (ref 5–15)
BUN: 28 mg/dL — ABNORMAL HIGH (ref 8–23)
CALCIUM: 8.7 mg/dL — AB (ref 8.9–10.3)
CO2: 20 mmol/L — AB (ref 22–32)
Chloride: 116 mmol/L — ABNORMAL HIGH (ref 98–111)
Creatinine, Ser: 1.88 mg/dL — ABNORMAL HIGH (ref 0.61–1.24)
GFR calc Af Amer: 40 mL/min — ABNORMAL LOW (ref >60)
GFR, EST NON AFRICAN AMERICAN: 35 mL/min — AB (ref >60)
GLUCOSE: 103 mg/dL — AB (ref 70–99)
POTASSIUM: 3.8 mmol/L (ref 3.5–5.1)
Sodium: 143 mmol/L (ref 135–145)

## 2017-11-30 LAB — HEMOGLOBIN A1C
HEMOGLOBIN A1C: 5.4 % (ref 4.8–5.6)
MEAN PLASMA GLUCOSE: 108.28 mg/dL

## 2017-11-30 MED ORDER — QUETIAPINE FUMARATE 25 MG PO TABS
25.0000 mg | ORAL_TABLET | Freq: Every evening | ORAL | Status: DC | PRN
  Administered 2017-11-30 – 2017-12-01 (×2): 25 mg via ORAL
  Filled 2017-11-30 (×2): qty 1

## 2017-11-30 MED ORDER — QUETIAPINE FUMARATE 25 MG PO TABS
12.5000 mg | ORAL_TABLET | Freq: Once | ORAL | Status: AC
  Administered 2017-11-30: 12.5 mg via ORAL
  Filled 2017-11-30: qty 1

## 2017-11-30 NOTE — Progress Notes (Signed)
[facility]  ROUNDING NOTE   Subjective:   Patient's mental status has improved significantly Resting quietly at present.  Sitter reports that he ate his breakfast.  Was able to ambulate to the bathroom.  No nausea or vomiting.  Overall doing better Serum creatinine about the same at 1.9  Objective:  Vital signs in last 24 hours:  Temp:  [97.4 F (36.3 C)] 97.4 F (36.3 C) (09/06 2032) Pulse Rate:  [81-87] 81 (09/07 0432) Resp:  [17-21] 17 (09/07 0432) BP: (92-105)/(60-66) 92/60 (09/07 0432) SpO2:  [96 %-98 %] 96 % (09/07 0432)  Weight change:  Filed Weights   11/23/17 0840 11/23/17 1348  Weight: 84.3 kg 79.8 kg    Intake/Output: I/O last 3 completed shifts: In: 5027 [P.O.:240; I.V.:990] Out: 3325 [Urine:3325]   Intake/Output this shift:  Total I/O In: 3 [I.V.:3] Out: 600 [Urine:600]  Physical Exam: General: NAD, laying in the bed  Head: Normocephalic, atraumatic.   Eyes: Anicteric,   Neck: Supple, trachea  Lungs:  Clear to auscultation  Heart: tachycardic  Abdomen:  Soft, nontender,   Extremities: no peripheral edema.  Neurologic:  Resting quietly  Skin: No acute lesions   GU Bilateral nephrostomy tubes    Basic Metabolic Panel: Recent Labs  Lab 11/25/17 0400 11/26/17 0359 11/27/17 0543 11/28/17 0922 11/29/17 0518 11/30/17 0732  NA 145 148* 143 138 140 143  K 3.8 3.6 3.2* 3.6 3.7 3.8  CL 115* 112* 109 108 112* 116*  CO2 20* 25 24 23  21* 20*  GLUCOSE 133* 124* 124* 108* 106* 103*  BUN 59* 40* 34* 29* 26* 28*  CREATININE 4.10* 2.39* 2.19* 1.78* 1.83* 1.88*  CALCIUM 9.0 9.0 9.0 8.7* 8.9 8.7*  MG 1.6*  --  1.8  --   --   --   PHOS 4.1  --   --   --   --   --     Liver Function Tests: No results for input(s): AST, ALT, ALKPHOS, BILITOT, PROT, ALBUMIN in the last 168 hours. No results for input(s): LIPASE, AMYLASE in the last 168 hours. Recent Labs  Lab 11/27/17 1645  AMMONIA <9*    CBC: Recent Labs  Lab 11/25/17 0400  11/27/17 0543 11/28/17 0922 11/28/17 1611 11/29/17 0016 11/29/17 1000  WBC 5.8  --   --   --   --   --   HGB 7.9* 8.6* 7.9* 8.2* 8.4* 8.2*  HCT 23.2*  --  22.8* 23.2* 23.8* 24.0*  MCV 88.9  --   --   --   --   --   PLT 218  --   --   --   --   --     Cardiac Enzymes: No results for input(s): CKTOTAL, CKMB, CKMBINDEX, TROPONINI in the last 168 hours.  BNP: Invalid input(s): POCBNP  CBG: No results for input(s): GLUCAP in the last 168 hours.  Microbiology: Results for orders placed or performed during the hospital encounter of 11/23/17  CULTURE, BLOOD (ROUTINE X 2) w Reflex to ID Panel     Status: None   Collection Time: 11/23/17  3:50 PM  Result Value Ref Range Status   Specimen Description BLOOD L HAND  Final   Special Requests   Final    BOTTLES DRAWN AEROBIC AND ANAEROBIC Blood Culture results may not be optimal due to an inadequate volume of blood received in culture bottles   Culture   Final    NO GROWTH 5 DAYS Performed at [facility]  [facility], [address] [place], [place] [address]    Report Status 11/28/2017 FINAL  Final  CULTURE, BLOOD (ROUTINE X 2) w Reflex to ID Panel     Status: None   Collection Time: 11/23/17  3:50 PM  Result Value Ref Range Status   Specimen Description BLOOD R HAND  Final   Special Requests   Final    BOTTLES DRAWN AEROBIC AND ANAEROBIC Blood Culture results may not be optimal due to an inadequate volume of blood received in culture bottles   Culture   Final    NO GROWTH 5 DAYS Performed at [facility], [address] [place] [address]    Report Status 11/28/2017 FINAL  Final    Coagulation Studies: No results for input(s): LABPROT, INR in the last 72 hours.  Urinalysis: No results for input(s): COLORURINE, LABSPEC, PHURINE, GLUCOSEU, HGBUR, BILIRUBINUR, KETONESUR, PROTEINUR, UROBILINOGEN, NITRITE, LEUKOCYTESUR in the last 72 hours.  Invalid input(s): APPERANCEUR    Imaging: US Carotid  Bilateral  Result Date: 11/29/2017 CLINICAL DATA:  Stroke.  Previous tobacco abuse. EXAM: BILATERAL CAROTID DUPLEX ULTRASOUND TECHNIQUE: Pearline Cables scale imaging, color Doppler and duplex ultrasound were performed of bilateral carotid and vertebral arteries in the neck. COMPARISON:  None. FINDINGS: Criteria: Quantification of carotid stenosis is based on velocity parameters that correlate the residual internal carotid diameter with NASCET-based stenosis levels, using the diameter of the distal internal carotid lumen as the denominator for stenosis measurement. The following velocity measurements were obtained: RIGHT ICA: 117/41 cm/sec CCA: 798/92 cm/sec SYSTOLIC ICA/CCA RATIO:  1.19 ECA: 105 cm/sec LEFT ICA: 105/36 cm/sec CCA: 417/40 cm/sec SYSTOLIC ICA/CCA RATIO:  0.7 ECA: 225 cm/sec RIGHT CAROTID ARTERY: Mild plaque in the carotid bulb. No significant stenosis. Normal waveforms and color Doppler signal. RIGHT VERTEBRAL ARTERY:  Normal flow direction and waveform. LEFT CAROTID ARTERY: Mild partially calcified plaque in the bulb at the ICA origin. No high-grade stenosis. Normal waveforms and color Doppler signal. LEFT VERTEBRAL ARTERY:  Normal flow direction and waveform. IMPRESSION: 1. Bilateral carotid bifurcation plaque resulting in less than 50% diameter stenosis. 2.  Antegrade bilateral vertebral arterial flow. Electronically Signed   By: Lucrezia Europe M.D.   On: 11/29/2017 11:02     Medications:   . sodium chloride Stopped (11/30/17 0721)   . aspirin EC  81 mg Oral Daily  . atorvastatin  40 mg Oral q1800  . feeding supplement (NEPRO CARB STEADY)  237 mL Oral BID BM  . megestrol  400 mg Oral BID  . multivitamin with minerals  1 tablet Oral Daily  . sodium chloride flush  10 mL Per Tube Q12H   sodium chloride, acetaminophen **OR** acetaminophen, HYDROcodone-acetaminophen, iopamidol, loratadine, morphine injection, ondansetron **OR** ondansetron (ZOFRAN) IV, polyethylene glycol, sodium chloride flush,  ziprasidone  Assessment/ Plan:  [name] is a 72 y.o. white male with prostate cancer who was admitted to [facility] on 11/23/2017. Nephrostomy tubes bilaterally placed by IR on 8/31.   1. Acute renal failure: baseline creatinine of 1.19, GFR >60 on 09/16/17 2. Obstructive Nephropathy 3. Hyperkalemia- now hypokalemia 4. Metabolic acidosis 5. Hypernatremia 6. 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 Renal function improving status post bilateral nephrostomy tubes placed on 8/31.Right nephrostomy with blood tinged urine   Mental status has improved significantly today Serum creatinine appears to be stabilizing at 1.8-1.9 MRI of the brain shows acute subcentimeter right frontal white matter infarct No changes today   LOS:  [place] 9/7/201912:25 PM

## 2017-11-30 NOTE — Progress Notes (Signed)
SLP Cancellation Note  Patient Details Name: JAELON GATLEY MRN: [id number] DOB: 26-Apr-1945   Cancelled treatment:       Reason Eval/Treat Not Completed: Fatigue/lethargy limiting ability to participate(chart reviewed; consulted NSG Sitter in room). Sitter reported pt had been awake and agitated all night attempting to climb out of the bed, raising arms/legs. Meds attempted but pt did not calm much. He had just fallen asleep this hour and Wife left to go home to rest also.  ST services will hold on seeing pt at this time to allow him to rest. Discussed aspiration precautions as posted in room when pt does awaken and is eating/drinking. Sitter agreed. ST services will f/u on Monday.    Orinda Kenner, MS, CCC-SLP Paulett Kaufhold 11/30/2017, 10:58 AM

## 2017-11-30 NOTE — Progress Notes (Signed)
PT Cancellation Note  Patient Details Name: [name] MRN: [id number] DOB: Jan 17, 1946   Cancelled Treatment:    Reason Eval/Treat Not Completed: Fatigue/lethargy limiting ability to participate   Attempted several times this am. Chart reviewed and discussed with sitter.  Pt with poor sleep last night and had been awoken several times this am for care.  Will reschedule for tomorrow.   Chesley Noon 11/30/2017, 11:25 AM

## 2017-11-30 NOTE — Progress Notes (Signed)
[facility] [place] NAME: [name]    MR#:  [id number]  DATE OF BIRTH:  1945-07-19  SUBJECTIVE:  CHIEF COMPLAINT:   Chief Complaint  Patient presents with  . Flank Pain  . Back Pain  . Altered Mental Status  Patient much more awake/alert, answered some questions. REVIEW OF SYSTEMS:  CONSTITUTIONAL: No fever, fatigue or weakness.  EYES: No blurred or double vision.  EARS, NOSE, AND THROAT: No tinnitus or ear pain.  RESPIRATORY: No cough, shortness of breath, wheezing or hemoptysis.  CARDIOVASCULAR: No chest pain, orthopnea, edema.  GASTROINTESTINAL: No nausea, vomiting, diarrhea or abdominal pain.  GENITOURINARY: No dysuria, hematuria.  ENDOCRINE: No polyuria, nocturia,  HEMATOLOGY: No anemia, easy bruising or bleeding SKIN: No rash or lesion. MUSCULOSKELETAL: No joint pain or arthritis.   NEUROLOGIC: No tingling, numbness, weakness.  PSYCHIATRY: No anxiety or depression.   ROS  DRUG ALLERGIES:   Allergies  Allergen Reactions  . Versed [Midazolam] Other (See Comments)    agitation    VITALS:  Blood pressure (!) 100/58, pulse 82, temperature 98.9 F (37.2 C), temperature source Oral, resp. rate 20, height 6' (1.829 m), weight 79.8 kg, SpO2 99 %.  PHYSICAL EXAMINATION:  GENERAL:  72 y.o.-year-old patient lying in the bed with no acute distress.  EYES: Pupils equal, round, reactive to light and accommodation. No scleral icterus. Extraocular muscles intact.  HEENT: Head atraumatic, normocephalic. Oropharynx and nasopharynx clear.  NECK:  Supple, no jugular venous distention. No thyroid enlargement, no tenderness.  LUNGS: Normal breath sounds bilaterally, no wheezing, rales,rhonchi or crepitation. No use of accessory muscles of respiration.  CARDIOVASCULAR: S1, S2 normal. No murmurs, rubs, or gallops.  ABDOMEN: Soft, nontender, nondistended. Bowel sounds present. No organomegaly or mass.  EXTREMITIES: No pedal edema, cyanosis,  or clubbing.  NEUROLOGIC: Cranial nerves II through XII are intact. Muscle strength 4/5 in all extremities. Sensation intact. Gait not checked.  PSYCHIATRIC: The patient is alert and oriented x 2-3.  SKIN: No obvious rash, lesion, or ulcer.   Physical Exam LABORATORY PANEL:   CBC Recent Labs  Lab 11/25/17 0400  11/29/17 1000  WBC 5.8  --   --   HGB 7.9*   < > 8.2*  HCT 23.2*   < > 24.0*  PLT 218  --   --    < > = values in this interval not displayed.   ------------------------------------------------------------------------------------------------------------------  Chemistries  Recent Labs  Lab 11/27/17 0543  11/30/17 0732  NA 143   < > 143  K 3.2*   < > 3.8  CL 109   < > 116*  CO2 24   < > 20*  GLUCOSE 124*   < > 103*  BUN 34*   < > 28*  CREATININE 2.19*   < > 1.88*  CALCIUM 9.0   < > 8.7*  MG 1.8  --   --    < > = values in this interval not displayed.   ------------------------------------------------------------------------------------------------------------------  Cardiac Enzymes No results for input(s): TROPONINI in the last 168 hours. ------------------------------------------------------------------------------------------------------------------  RADIOLOGY:  US Carotid Bilateral  Result Date: 11/29/2017 CLINICAL DATA:  Stroke.  Previous tobacco abuse. EXAM: BILATERAL CAROTID DUPLEX ULTRASOUND TECHNIQUE: Pearline Cables scale imaging, color Doppler and duplex ultrasound were performed of bilateral carotid and vertebral arteries in the neck. COMPARISON:  None. FINDINGS: Criteria: Quantification of carotid stenosis is based on velocity parameters that correlate the residual internal carotid diameter with NASCET-based stenosis levels, using  the diameter of the distal internal carotid lumen as the denominator for stenosis measurement. The following velocity measurements were obtained: RIGHT ICA: 117/41 cm/sec CCA: 941/74 cm/sec SYSTOLIC ICA/CCA RATIO:  0.81 ECA: 105 cm/sec  LEFT ICA: 105/36 cm/sec CCA: 448/18 cm/sec SYSTOLIC ICA/CCA RATIO:  0.7 ECA: 225 cm/sec RIGHT CAROTID ARTERY: Mild plaque in the carotid bulb. No significant stenosis. Normal waveforms and color Doppler signal. RIGHT VERTEBRAL ARTERY:  Normal flow direction and waveform. LEFT CAROTID ARTERY: Mild partially calcified plaque in the bulb at the ICA origin. No high-grade stenosis. Normal waveforms and color Doppler signal. LEFT VERTEBRAL ARTERY:  Normal flow direction and waveform. IMPRESSION: 1. Bilateral carotid bifurcation plaque resulting in less than 50% diameter stenosis. 2.  Antegrade bilateral vertebral arterial flow. Electronically Signed   By: Lucrezia Europe M.D.   On: 11/29/2017 11:02    ASSESSMENT AND PLAN:  *Acute right CVA Stroke protocol, follow-up on echocardiogram, carotid Dopplers, neurology input appreciated, aspirin, statin therapy, speech therapy/physical therapy to evaluate/treat  *Acute renal failure Improving. Due tob/lhydronephrosisdue toobstructive uropathy-prostate cancer/mass Discontinued IV fluids for rehydration due to low ejection fraction 30%. avoid nephrotoxic agents, renal ultrasound negative for hydronephrosis Nephrology input appreciated  *Acute bilateral hydronephrosis  Resolved Due to obstructive uropathy-prostate cancer/mass  S/p bilateral nephrostomy tube placementby IR Urology and nephrology input appreciated   *Acute gross right hematuria Resolving Renal ultrasound negative for hydronephrosis  *Acutehypokalemia  Repletedand improved.  *Acute pulmonary edema Due to ARF CAP ruled out -antibiotics discontinued  *Chronic worsening metastatic prostate cancer follow-up as outpatient for chemo/radiation  *Anemia of chronic disease Hemoglobinstable  All the records are reviewed and case discussed with Care Management/Social Workerr. Management plans discussed with the patient, his wife and they are in agreement.  CODE STATUS:  full TOTAL TIME TAKING CARE OF THIS PATIENT: 33 minutes.  POSSIBLE D/C IN 1-2 DAYS, DEPENDING ON CLINICAL CONDITION.   Demetrios Loll M.D on 11/30/2017   Between 7am to 6pm - Pager - [phone]  After 6pm go to [email or web address] - password [email or web address] [facility]  Office  [phone]  CC: Primary care physician; [name], MD  Note: This dictation was prepared with Dragon dictation along with smaller phrase technology. Any transcriptional errors that result from this process are unintentional.

## 2017-12-01 LAB — BASIC METABOLIC PANEL
ANION GAP: 6 (ref 5–15)
BUN: 30 mg/dL — ABNORMAL HIGH (ref 8–23)
CALCIUM: 8.5 mg/dL — AB (ref 8.9–10.3)
CO2: 21 mmol/L — AB (ref 22–32)
CREATININE: 1.57 mg/dL — AB (ref 0.61–1.24)
Chloride: 116 mmol/L — ABNORMAL HIGH (ref 98–111)
GFR, EST AFRICAN AMERICAN: 50 mL/min — AB (ref >60)
GFR, EST NON AFRICAN AMERICAN: 43 mL/min — AB (ref >60)
Glucose, Bld: 111 mg/dL — ABNORMAL HIGH (ref 70–99)
Potassium: 3.9 mmol/L (ref 3.5–5.1)
SODIUM: 143 mmol/L (ref 135–145)

## 2017-12-01 MED ORDER — ADULT MULTIVITAMIN W/MINERALS CH: 1.0000 | ORAL_TABLET | Freq: Every day | ORAL | Status: DC

## 2017-12-01 MED ORDER — CLOPIDOGREL BISULFATE 75 MG PO TABS
75.0000 mg | ORAL_TABLET | Freq: Every day | ORAL | Status: DC
  Administered 2017-12-01 – 2017-12-02 (×2): 75 mg via ORAL
  Filled 2017-12-01 (×2): qty 1

## 2017-12-01 MED ORDER — TRAZODONE HCL 50 MG PO TABS
50.0000 mg | ORAL_TABLET | Freq: Once | ORAL | Status: AC
  Administered 2017-12-01: 02:00:00 50 mg via ORAL
  Filled 2017-12-01: qty 1

## 2017-12-01 MED ORDER — NEPRO/CARBSTEADY PO LIQD: 237.0000 mL | Freq: Two times a day (BID) | ORAL | 0 refills | Status: DC

## 2017-12-01 MED ORDER — TRAZODONE HCL 50 MG PO TABS
50.0000 mg | ORAL_TABLET | Freq: Once | ORAL | Status: AC
  Administered 2017-12-01: 22:00:00 50 mg via ORAL
  Filled 2017-12-01: qty 1

## 2017-12-01 MED ORDER — ATORVASTATIN CALCIUM 40 MG PO TABS: 40.0000 mg | ORAL_TABLET | Freq: Every day | ORAL | Status: DC

## 2017-12-01 MED ORDER — CLOPIDOGREL BISULFATE 75 MG PO TABS: 75.0000 mg | ORAL_TABLET | Freq: Every day | ORAL | Status: DC

## 2017-12-01 MED ORDER — POLYETHYLENE GLYCOL 3350 17 G PO PACK: 17.0000 g | PACK | Freq: Every day | ORAL | 0 refills | Status: AC | PRN

## 2017-12-01 MED ORDER — QUETIAPINE FUMARATE 25 MG PO TABS: 25.0000 mg | ORAL_TABLET | Freq: Every evening | ORAL | Status: DC | PRN

## 2017-12-01 NOTE — Progress Notes (Signed)
Physical Therapy Treatment Patient Details Name: [name] MRN: [id number] DOB: 20-Sep-1945 Today's Date: 12/01/2017    History of Present Illness presented to ER secondary to back pain; admitted with acute renal failure secondary to acute bilat hydronephrosis.  Status post bilat nephrostomy tube placement. Hospital course complicated by periods of agitation and combativeness prompting neuro evaluation.  MRI significant for L frontal infarct.    PT Comments    Pt was seen for progression of gait and to work on LE strength with good results.  His walk was more controlled and was very engaged with following instructions for exercises.  Will continue on with daily mobility and expect him to dc to SNF as soon as MD feels he is medically stable.  Follow acutely as planned.   Follow Up Recommendations  SNF     Equipment Recommendations       Recommendations for Other Services       Precautions / Restrictions Precautions Precautions: Fall Precaution Comments: bilat nephrostomy tubes Restrictions Weight Bearing Restrictions: No    Mobility  Bed Mobility Overal bed mobility: Needs Assistance Bed Mobility: Supine to Sit     Supine to sit: Min guard     General bed mobility comments: assisting to manage nephrostomy tubes and hand placement  Transfers Overall transfer level: Needs assistance Equipment used: Rolling walker (2 wheeled) Transfers: Sit to/from Stand Sit to Stand: Min guard;Min assist         General transfer comment: pt requires repetition of cues for hand placcement  Ambulation/Gait Ambulation/Gait assistance: Min assist;Min guard Gait Distance (Feet): 180 Feet Assistive device: Rolling walker (2 wheeled) Gait Pattern/deviations: Step-through pattern;Decreased stride length;Wide base of support;Trunk flexed Gait velocity: reduced Gait velocity interpretation: <1.31 ft/sec, indicative of household ambulator     Environmental education officer [place] (Stroke Patients Only)       Balance Overall balance assessment: Needs assistance Sitting-balance support: Feet supported Sitting balance-Leahy Scale: Good     Standing balance support: Bilateral upper extremity supported;During functional activity Standing balance-Leahy Scale: Poor                              Cognition Arousal/Alertness: Awake/alert Behavior During Therapy: WFL for tasks assessed/performed Overall Cognitive Status: Impaired/Different from baseline Area of Impairment: Safety/judgement;Awareness;Problem solving                         Safety/Judgement: Decreased awareness of safety;Decreased awareness of deficits Awareness: Intellectual Problem Solving: Slow processing;Requires verbal cues General Comments: impulsive      Exercises General Exercises - Lower Extremity Ankle Circles/Pumps: AROM;Both;5 reps Long Arc Quad: Strengthening;Both;10 reps Heel Slides: Strengthening;Both;10 reps Hip ABduction/ADduction: AROM;Both;10 reps    General Comments        Pertinent Vitals/Pain Pain Assessment: No/denies pain    Home Living                      Prior Function            PT Goals (current goals can now be found in the care plan section) Acute Rehab PT Goals Patient Stated Goal: to get stronger Progress towards PT goals: Progressing toward goals    Frequency    7X/week      PT Plan Current plan remains appropriate    Co-evaluation  AM-PAC PT "6 Clicks" Daily Activity  Outcome Measure  Difficulty turning over in bed (including adjusting bedclothes, sheets and blankets)?: A Little Difficulty moving from lying on back to sitting on the side of the bed? : Unable Difficulty sitting down on and standing up from a chair with arms (e.g., wheelchair, bedside commode, etc,.)?: Unable Help needed moving to and from a bed to chair (including a wheelchair)?: A  Little Help needed walking in hospital room?: A Little Help needed climbing 3-5 steps with a railing? : A Little 6 Click Score: 14    End of Session Equipment Utilized During Treatment: Gait belt Activity Tolerance: Patient tolerated treatment well;Patient limited by fatigue Patient left: in chair;with call bell/phone within reach;with family/visitor present;with nursing/sitter in room(sitter) Nurse Communication: Mobility status PT Visit Diagnosis: Unsteadiness on feet (R26.81);Muscle weakness (generalized) (M62.81);Difficulty in walking, not elsewhere classified (R26.2);Hemiplegia and hemiparesis Hemiplegia - Right/Left: Left Hemiplegia - dominant/non-dominant: Non-dominant Hemiplegia - caused by: Cerebral infarction     Time: 1202-1227 PT Time Calculation (min) (ACUTE ONLY): 25 min  Charges:  $Gait Training: 8-22 mins $Therapeutic Exercise: 8-22 mins                  Ramond Dial 12/01/2017, 9:29 PM   Mee Hives, PT MS Acute Rehab Dept. Number: [place] and [place]

## 2017-12-01 NOTE — NC FL2 (Signed)
[place] LEVEL OF CARE SCREENING TOOL     IDENTIFICATION  Patient Name: [name] Birthdate: March 07, 1946 Sex: male Admission Date (Current Location): 11/23/2017  [place] and [place] Number:  Engineering geologist and Address:  [facility], [address] [place], [place] [address]      Provider Number: [id number]  Attending Physician Name and Address:  Demetrios Loll, MD  Relative Name and Phone Number:  Squire Withey (Spouse) [phone]    Current Level of Care: Hospital Recommended Level of Care: [place] Prior Approval Number:    Date Approved/Denied:   PASRR Number: [id number]  Discharge Plan: SNF    Current Diagnoses: Patient Active Problem List   Diagnosis Date Noted  . Protein-calorie malnutrition, severe 11/24/2017  . ARF (acute renal failure) ([place]) 11/23/2017  . Port-A-Cath in place 06/18/2017  . Encounter for antineoplastic chemotherapy 11/21/2016  . Acute seasonal allergic rhinitis due to pollen 12/28/2015  . Prostate cancer metastatic to multiple sites ([place]) 12/24/2014  . Advance directive discussed with patient 12/24/2014  . Prostate cancer ([place]) 02/23/2014  . Benign paroxysmal positional vertigo 12/07/2013  . Back pain 12/09/2012  . Goals of care, counseling/discussion 07/14/2010  . DIVERTICULOSIS, COLON 10/08/2006    Orientation RESPIRATION BLADDER Height & Weight     Self, Time, Situation, Place  Normal Continent Weight: 176 lb (79.8 kg) Height:  6' (182.9 cm)  BEHAVIORAL SYMPTOMS/MOOD NEUROLOGICAL BOWEL NUTRITION STATUS      Continent Diet(Dysphagia 3)  AMBULATORY STATUS COMMUNICATION OF NEEDS Skin   Extensive Assist Verbally Normal                       Personal Care Assistance Level of Assistance  Bathing, Feeding, Dressing Bathing Assistance: Limited assistance Feeding assistance: Independent Dressing Assistance: Limited assistance     Functional Limitations Info  Sight,  Hearing, Speech Sight Info: Adequate Hearing Info: Adequate Speech Info: Adequate    SPECIAL CARE FACTORS FREQUENCY  PT (By licensed PT)     PT Frequency: Up to 5X per week              Contractures Contractures Info: Not present    Additional Factors Info  Code Status, Allergies Code Status Info: Full Allergies Info: Versed Midazolam           Current Medications (12/01/2017):  This is the current hospital active medication list Current Facility-Administered Medications  Medication Dose Route Frequency Provider Last Rate Last Dose  . 0.9 %  sodium chloride infusion  250 mL Intravenous PRN Gorden Harms, MD   Stopped at 11/30/17 7628  . acetaminophen (TYLENOL) tablet 650 mg  650 mg Oral Q6H PRN Salary, Montell D, MD   650 mg at 12/01/17 1114   Or  . acetaminophen (TYLENOL) suppository 650 mg  650 mg Rectal Q6H PRN Salary, Montell D, MD      . aspirin EC tablet 81 mg  81 mg Oral Daily Salary, Montell D, MD   81 mg at 12/01/17 1114  . atorvastatin (LIPITOR) tablet 40 mg  40 mg Oral q1800 Salary, Montell D, MD   40 mg at 11/30/17 1726  . clopidogrel (PLAVIX) tablet 75 mg  75 mg Oral Daily Demetrios Loll, MD   75 mg at 12/01/17 1114  . feeding supplement (NEPRO CARB STEADY) liquid 237 mL  237 mL Oral BID BM Demetrios Loll, MD 0 mL/hr at 11/30/17 1730 237 mL at 12/01/17 1116  . HYDROcodone-acetaminophen (  NORCO/VICODIN) 5-325 MG per tablet 1 tablet  1 tablet Oral Q8H PRN Salary, Avel Peace, MD   1 tablet at 11/29/17 1845  . iopamidol (ISOVUE-300) 61 % injection 30 mL  30 mL Other Once PRN Greggory Keen, MD      . loratadine (CLARITIN) tablet 10 mg  10 mg Oral Daily PRN Salary, Montell D, MD      . megestrol (MEGACE) 400 MG/10ML suspension 400 mg  400 mg Oral BID Salary, Montell D, MD   400 mg at 12/01/17 1114  . morphine 2 MG/ML injection 2 mg  2 mg Intravenous Q4H PRN Salary, Montell D, MD   2 mg at 11/28/17 4888  . multivitamin with minerals tablet 1 tablet  1 tablet Oral Daily Demetrios Loll, MD   1 tablet at 12/01/17 1114  . ondansetron (ZOFRAN) tablet 4 mg  4 mg Oral Q6H PRN Salary, Montell D, MD       Or  . ondansetron (ZOFRAN) injection 4 mg  4 mg Intravenous Q6H PRN Salary, Montell D, MD   4 mg at 11/24/17 0524  . polyethylene glycol (MIRALAX / GLYCOLAX) packet 17 g  17 g Oral Daily PRN Salary, Montell D, MD      . QUEtiapine (SEROQUEL) tablet 25 mg  25 mg Oral QHS PRN Lance Coon, MD   25 mg at 11/30/17 2326  . sodium chloride flush (NS) 0.9 % injection 10 mL  10 mL Per Tube Q12H Kolluru, Sarath, MD   10 mL at 12/01/17 1116  . sodium chloride flush (NS) 0.9 % injection 10-40 mL  10-40 mL Intracatheter PRN Demetrios Loll, MD      . ziprasidone (GEODON) injection 10 mg  10 mg Intramuscular TID PRN Salary, Avel Peace, MD   10 mg at 11/28/17 1548   Facility-Administered Medications Ordered in Other Encounters  Medication Dose Route Frequency Provider Last Rate Last Dose  . denosumab (XGEVA) injection 120 mg  120 mg Subcutaneous Once Shadad, [name], MD         Discharge Medications: Please see discharge summary for a list of discharge medications.  Relevant Imaging Results:  Relevant Lab Results:   Additional Information SS# [id number]  Zettie Pho, LCSW

## 2017-12-01 NOTE — Progress Notes (Signed)
[facility] [place] NAME: [name]    MR#:  [id number]  DATE OF BIRTH:  [birth year]  SUBJECTIVE:  CHIEF COMPLAINT:   Chief Complaint  Patient presents with  . Flank Pain  . Back Pain  . Altered Mental Status  Patient is confused this am. REVIEW OF SYSTEMS:  CONSTITUTIONAL: No fever, fatigue or weakness.  EYES: No blurred or double vision.  EARS, NOSE, AND THROAT: No tinnitus or ear pain.  RESPIRATORY: No cough, shortness of breath, wheezing or hemoptysis.  CARDIOVASCULAR: No chest pain, orthopnea, edema.  GASTROINTESTINAL: No nausea, vomiting, diarrhea or abdominal pain.  GENITOURINARY: No dysuria, hematuria.  ENDOCRINE: No polyuria, nocturia,  HEMATOLOGY: No anemia, easy bruising or bleeding SKIN: No rash or lesion. MUSCULOSKELETAL: No joint pain or arthritis.   NEUROLOGIC: No tingling, numbness, weakness.  PSYCHIATRY: No anxiety or depression.   ROS  DRUG ALLERGIES:   Allergies  Allergen Reactions  . Versed [Midazolam] Other (See Comments)    agitation    VITALS:  Blood pressure 113/60, pulse 78, temperature 98.6 F (37 C), temperature source Oral, resp. rate 20, height 6' (1.829 m), weight 79.8 kg, SpO2 100 %.  PHYSICAL EXAMINATION:  GENERAL:  72 y.o.-year-old patient lying in the bed with no acute distress.  EYES: Pupils equal, round, reactive to light and accommodation. No scleral icterus. Extraocular muscles intact.  HEENT: Head atraumatic, normocephalic. Oropharynx and nasopharynx clear.  NECK:  Supple, no jugular venous distention. No thyroid enlargement, no tenderness.  LUNGS: Normal breath sounds bilaterally, no wheezing, rales,rhonchi or crepitation. No use of accessory muscles of respiration.  CARDIOVASCULAR: S1, S2 normal. No murmurs, rubs, or gallops.  ABDOMEN: Soft, nontender, nondistended. Bowel sounds present. No organomegaly or mass.  EXTREMITIES: No pedal edema, cyanosis, or clubbing.  NEUROLOGIC:  Cranial nerves II through XII are intact. Muscle strength 4/5 in all extremities. Sensation intact. Gait not checked.  PSYCHIATRIC: The patient is alert and oriented x 1-2.  SKIN: No obvious rash, lesion, or ulcer.   Physical Exam LABORATORY PANEL:   CBC Recent Labs  Lab 11/25/17 0400  11/29/17 1000  WBC 5.8  --   --   HGB 7.9*   < > 8.2*  HCT 23.2*   < > 24.0*  PLT 218  --   --    < > = values in this interval not displayed.   ------------------------------------------------------------------------------------------------------------------  Chemistries  Recent Labs  Lab 11/27/17 0543  12/01/17 0642  NA 143   < > 143  K 3.2*   < > 3.9  CL 109   < > 116*  CO2 24   < > 21*  GLUCOSE 124*   < > 111*  BUN 34*   < > 30*  CREATININE 2.19*   < > 1.57*  CALCIUM 9.0   < > 8.5*  MG 1.8  --   --    < > = values in this interval not displayed.   ------------------------------------------------------------------------------------------------------------------  Cardiac Enzymes No results for input(s): TROPONINI in the last 168 hours. ------------------------------------------------------------------------------------------------------------------  RADIOLOGY:  No results found.  ASSESSMENT AND PLAN:  *Acute right CVA Stroke protocol, follow-up on echocardiogram:  low ejection fraction 30%, carotid Dopplers unremarkable, neurology input appreciated. Continue aspirin, statin therapy, added Plavix.  *Acute renal failure Improving. Due tob/lhydronephrosisdue toobstructive uropathy-prostate cancer/mass Discontinued IV fluids for rehydration due to low ejection fraction 30%. avoid nephrotoxic agents, renal ultrasound negative for hydronephrosis Nephrology input appreciated  *Acute bilateral hydronephrosis  Resolved Due to obstructive uropathy-prostate cancer/mass  S/p bilateral nephrostomy tube placementby IR Urology and nephrology input appreciated   *Acute gross  right hematuria Resolving Renal ultrasound negative for hydronephrosis  *Acutehypokalemia  Repletedand improved.  *Acute pulmonary edema Due to ARF CAP ruled out -antibiotics discontinued.  *Chronic worsening metastatic prostate cancer follow-up as outpatient for chemo/radiation  *Anemia of chronic disease Hemoglobinstable  *Acute metabolic encephalopathy and underlying dementia. Improving.  Generalized weakness.  PT. All the records are reviewed and case discussed with Care Management/Social Workerr. Management plans discussed with the patient, his wife and they are in agreement.  CODE STATUS: full TOTAL TIME TAKING CARE OF THIS PATIENT: 33 minutes.  POSSIBLE D/C IN 1-2 DAYS, DEPENDING ON CLINICAL CONDITION.   Demetrios Loll M.D on 12/01/2017   Between 7am to 6pm - Pager - [phone]  After 6pm go to [email or web address] - password [email or web address] [facility]  Office  [phone]  CC: Primary care physician; [name], MD  Note: This dictation was prepared with Dragon dictation along with smaller phrase technology. Any transcriptional errors that result from this process are unintentional.

## 2017-12-01 NOTE — Clinical Social Work Note (Signed)
Clinical Social Work Assessment  Patient Details  Name: [name] MRN: [id number] Date of Birth: [birth year]  Date of referral:  12/01/17               Reason for consult:  Facility Placement                Permission sought to share information with:  Chartered certified accountant granted to share information::  Yes, Verbal Permission Granted  Name::        Agency::  [facility] area SNFs  Relationship::     Contact Information:     Housing/Transportation Living arrangements for the past 2 months:  [place] of Information:  Patient, Medical Team, Spouse Patient Interpreter Needed:  None Criminal Activity/Legal Involvement Pertinent to Current Situation/Hospitalization:  No - Comment as needed Significant Relationships:  Warehouse manager, Spouse Lives with:  Spouse Do you feel safe going back to the place where you live?  Yes Need for family participation in patient care:  No (Coment)  Care giving concerns: PT recommendation for SNF   Social Worker assessment / plan:  The CSW met with the patient and his spouse at bedside to discuss discharge planning. The patient was upbeat and alert. He was oriented to place, self, situation, and month. The patient's wife shared that he has not slept much in the time he has been admitted, and she is concerned about his mentation at night.The patient has had a sitter for safety which the team will attempt to discontinue today for possible dc to SNF tomorrow. The patient's wife indicated that [facility] is the preference and gave verbal permission to begin the referral process. The patient and his wife also had general questions about care after SNF including home health PT and aide. The CSW answered the questions to their satisfaction.  The CSW will continue to follow for discharge facilitation and will provide bed offers as they are available.  Employment status:  Retired Forensic scientist:   [employer] PT Recommendations:  [place] / Referral to community resources:  [place]  Patient/Family's Response to care:  The patient and his wife thanked the CSW.  Patient/Family's Understanding of and Emotional Response to Diagnosis, Current Treatment, and Prognosis:  The patient's wife seems overwhelmed and showing signs of caregiver fatigue. The patient does seem to understand the need for SNF level of care and is in agreement.  Emotional Assessment Appearance:  Appears stated age Attitude/Demeanor/Rapport:  Charismatic, Engaged Affect (typically observed):  Appropriate, Pleasant Orientation:  Oriented to Self, Oriented to Place, Oriented to Situation, Oriented to  Time Alcohol / Substance use:  Never Used Psych involvement (Current and /or in the community):  No (Comment)  Discharge Needs  Concerns to be addressed:  Care Coordination, Discharge Planning Concerns Readmission within the last 30 days:  No Current discharge risk:  Chronically ill Barriers to Discharge:  Continued Medical Work up   [employer], LCSW 12/01/2017, 11:31 AM

## 2017-12-01 NOTE — Discharge Instructions (Signed)
Dysphagia 3 diet. Aspiration and fall precaution.

## 2017-12-02 ENCOUNTER — Emergency Department
Admission: EM | Admit: 2017-12-02 | Discharge: 2017-12-02 | Disposition: A | Discharge status: Home / Self Care | Payer: Medicare Other | Attending: Emergency Medicine | Admitting: Emergency Medicine

## 2017-12-02 ENCOUNTER — Other Ambulatory Visit: Payer: Self-pay

## 2017-12-02 ENCOUNTER — Ambulatory Visit: Payer: Medicare Other

## 2017-12-02 ENCOUNTER — Telehealth: Payer: Self-pay

## 2017-12-02 ENCOUNTER — Telehealth: Payer: Self-pay | Admitting: Radiation Oncology

## 2017-12-02 DIAGNOSIS — R443 Hallucinations, unspecified: Secondary | ICD-10-CM

## 2017-12-02 DIAGNOSIS — E782 Mixed hyperlipidemia: Secondary | ICD-10-CM | POA: Diagnosis not present

## 2017-12-02 DIAGNOSIS — G9340 Encephalopathy, unspecified: Secondary | ICD-10-CM | POA: Diagnosis not present

## 2017-12-02 DIAGNOSIS — Z936 Other artificial openings of urinary tract status: Secondary | ICD-10-CM | POA: Diagnosis not present

## 2017-12-02 DIAGNOSIS — Z87891 Personal history of nicotine dependence: Secondary | ICD-10-CM | POA: Diagnosis not present

## 2017-12-02 DIAGNOSIS — E440 Moderate protein-calorie malnutrition: Secondary | ICD-10-CM | POA: Diagnosis not present

## 2017-12-02 DIAGNOSIS — E43 Unspecified severe protein-calorie malnutrition: Secondary | ICD-10-CM | POA: Diagnosis not present

## 2017-12-02 DIAGNOSIS — I63013 Cerebral infarction due to thrombosis of bilateral vertebral arteries: Secondary | ICD-10-CM | POA: Diagnosis not present

## 2017-12-02 DIAGNOSIS — E785 Hyperlipidemia, unspecified: Secondary | ICD-10-CM | POA: Diagnosis not present

## 2017-12-02 DIAGNOSIS — N179 Acute kidney failure, unspecified: Secondary | ICD-10-CM | POA: Diagnosis not present

## 2017-12-02 DIAGNOSIS — R442 Other hallucinations: Secondary | ICD-10-CM | POA: Insufficient documentation

## 2017-12-02 DIAGNOSIS — J309 Allergic rhinitis, unspecified: Secondary | ICD-10-CM | POA: Diagnosis not present

## 2017-12-02 DIAGNOSIS — K5900 Constipation, unspecified: Secondary | ICD-10-CM | POA: Diagnosis not present

## 2017-12-02 DIAGNOSIS — R410 Disorientation, unspecified: Secondary | ICD-10-CM | POA: Diagnosis not present

## 2017-12-02 DIAGNOSIS — Z436 Encounter for attention to other artificial openings of urinary tract: Secondary | ICD-10-CM | POA: Diagnosis not present

## 2017-12-02 DIAGNOSIS — F05 Delirium due to known physiological condition: Secondary | ICD-10-CM | POA: Insufficient documentation

## 2017-12-02 DIAGNOSIS — E569 Vitamin deficiency, unspecified: Secondary | ICD-10-CM | POA: Diagnosis not present

## 2017-12-02 DIAGNOSIS — J189 Pneumonia, unspecified organism: Secondary | ICD-10-CM | POA: Diagnosis not present

## 2017-12-02 DIAGNOSIS — C61 Malignant neoplasm of prostate: Secondary | ICD-10-CM | POA: Diagnosis not present

## 2017-12-02 DIAGNOSIS — M6281 Muscle weakness (generalized): Secondary | ICD-10-CM | POA: Diagnosis not present

## 2017-12-02 DIAGNOSIS — N189 Chronic kidney disease, unspecified: Secondary | ICD-10-CM | POA: Diagnosis not present

## 2017-12-02 DIAGNOSIS — N1330 Unspecified hydronephrosis: Secondary | ICD-10-CM | POA: Diagnosis not present

## 2017-12-02 DIAGNOSIS — F0151 Vascular dementia with behavioral disturbance: Secondary | ICD-10-CM | POA: Diagnosis not present

## 2017-12-02 DIAGNOSIS — D649 Anemia, unspecified: Secondary | ICD-10-CM | POA: Diagnosis not present

## 2017-12-02 DIAGNOSIS — R451 Restlessness and agitation: Secondary | ICD-10-CM | POA: Diagnosis not present

## 2017-12-02 DIAGNOSIS — Z743 Need for continuous supervision: Secondary | ICD-10-CM | POA: Diagnosis not present

## 2017-12-02 DIAGNOSIS — E872 Acidosis: Secondary | ICD-10-CM | POA: Diagnosis not present

## 2017-12-02 DIAGNOSIS — I6350 Cerebral infarction due to unspecified occlusion or stenosis of unspecified cerebral artery: Secondary | ICD-10-CM | POA: Diagnosis not present

## 2017-12-02 DIAGNOSIS — Z87448 Personal history of other diseases of urinary system: Secondary | ICD-10-CM | POA: Diagnosis not present

## 2017-12-02 DIAGNOSIS — I6930 Unspecified sequelae of cerebral infarction: Secondary | ICD-10-CM | POA: Diagnosis not present

## 2017-12-02 DIAGNOSIS — R4182 Altered mental status, unspecified: Secondary | ICD-10-CM | POA: Diagnosis not present

## 2017-12-02 DIAGNOSIS — E875 Hyperkalemia: Secondary | ICD-10-CM | POA: Diagnosis not present

## 2017-12-02 LAB — URINALYSIS, COMPLETE (UACMP) WITH MICROSCOPIC
Bacteria, UA: NONE SEEN
Bacteria, UA: NONE SEEN
Bilirubin Urine: NEGATIVE
GLUCOSE, UA: NEGATIVE mg/dL
KETONES UR: NEGATIVE mg/dL
Leukocytes, UA: NEGATIVE
Nitrite: NEGATIVE
PROTEIN: 30 mg/dL — AB
RBC / HPF: >50 RBC/hpf — ABNORMAL HIGH (ref 0–5)
SPECIFIC GRAVITY, URINE: 1.015 (ref 1.005–1.030)
Specific Gravity, Urine: 1.013 (ref 1.005–1.030)
pH: 6 (ref 5.0–8.0)

## 2017-12-02 LAB — COMPREHENSIVE METABOLIC PANEL
ALK PHOS: 50 U/L (ref 38–126)
ALT: 16 U/L (ref 0–44)
AST: 16 U/L (ref 15–41)
Albumin: 3.5 g/dL (ref 3.5–5.0)
Anion gap: 8 (ref 5–15)
BILIRUBIN TOTAL: 0.5 mg/dL (ref 0.3–1.2)
BUN: 40 mg/dL — AB (ref 8–23)
CALCIUM: 9 mg/dL (ref 8.9–10.3)
CHLORIDE: 113 mmol/L — AB (ref 98–111)
CO2: 19 mmol/L — ABNORMAL LOW (ref 22–32)
CREATININE: 1.74 mg/dL — AB (ref 0.61–1.24)
GFR calc Af Amer: 44 mL/min — ABNORMAL LOW (ref >60)
GFR, EST NON AFRICAN AMERICAN: 38 mL/min — AB (ref >60)
Glucose, Bld: 104 mg/dL — ABNORMAL HIGH (ref 70–99)
Potassium: 4.2 mmol/L (ref 3.5–5.1)
Sodium: 140 mmol/L (ref 135–145)
TOTAL PROTEIN: 6.8 g/dL (ref 6.5–8.1)

## 2017-12-02 LAB — CBC
HCT: 22.4 % — ABNORMAL LOW (ref 40.0–52.0)
HEMOGLOBIN: 7.6 g/dL — AB (ref 13.0–18.0)
MCH: 30.2 pg (ref 26.0–34.0)
MCHC: 34.1 g/dL (ref 32.0–36.0)
MCV: 88.7 fL (ref 80.0–100.0)
Platelets: 263 10*3/uL (ref 150–440)
RBC: 2.52 MIL/uL — AB (ref 4.40–5.90)
RDW: 14.5 % (ref 11.5–14.5)
WBC: 4.3 10*3/uL (ref 3.8–10.6)

## 2017-12-02 MED ORDER — ALUM & MAG HYDROXIDE-SIMETH 200-200-20 MG/5ML PO SUSP: 30.0000 mL | ORAL | Status: DC | PRN

## 2017-12-02 MED ORDER — HEPARIN SOD (PORK) LOCK FLUSH 10 UNIT/ML IV SOLN
10.0000 [IU] | Freq: Once | INTRAVENOUS | Status: DC
  Filled 2017-12-02: qty 1

## 2017-12-02 MED ORDER — LORAZEPAM 2 MG/ML IJ SOLN
INTRAMUSCULAR | Status: AC
  Filled 2017-12-02: qty 1

## 2017-12-02 MED ORDER — HEPARIN SOD (PORK) LOCK FLUSH 100 UNIT/ML IV SOLN
500.0000 [IU] | Freq: Once | INTRAVENOUS | Status: DC
  Filled 2017-12-02: qty 5

## 2017-12-02 MED ORDER — HALOPERIDOL 2 MG PO TABS
2.0000 mg | ORAL_TABLET | Freq: Once | ORAL | Status: AC
  Administered 2017-12-02: 2 mg via ORAL
  Filled 2017-12-02: qty 1

## 2017-12-02 MED ORDER — HEPARIN SOD (PORK) LOCK FLUSH 100 UNIT/ML IV SOLN
INTRAVENOUS | Status: AC
  Filled 2017-12-02: qty 5

## 2017-12-02 MED ORDER — LORAZEPAM 2 MG/ML IJ SOLN
1.0000 mg | Freq: Once | INTRAMUSCULAR | Status: AC
  Administered 2017-12-02: 1 mg via INTRAVENOUS

## 2017-12-02 NOTE — Progress Notes (Signed)
Phoned patient's wife to inquire about discharge plans. She confirms that the plan is to discharge him today to a SNF. She reports he will most likely go to [facility] in [place] for a week then transition out "to a facility she prefers." Explained this RN plans to update Dr. Tammi Klippel on her husbands status and will call back with directions about next steps associated with radiation therapy. She verbalized understanding and expressed appreciation for the call.

## 2017-12-02 NOTE — Progress Notes (Signed)
[facility]  ROUNDING NOTE   Subjective:  Patient has good UOP.  Cr currently 1.57 today.  In good spirits.    Objective:  Vital signs in last 24 hours:  Temp:  [98 F (36.7 C)-98.6 F (37 C)] 98.2 F (36.8 C) (09/09 0635) Pulse Rate:  [78-93] 89 (09/09 0635) Resp:  [20-22] 22 (09/09 0635) BP: (94-147)/(60-85) 94/70 (09/09 0635) SpO2:  [92 %-100 %] 92 % (09/09 0635)  Weight change:  Filed Weights   11/23/17 0840 11/23/17 1348  Weight: 84.3 kg 79.8 kg    Intake/Output: I/O last 3 completed shifts: In: 9379 [P.O.:1080; Other:270] Out: 2775 [Urine:2775]   Intake/Output this shift:  Total I/O In: 380 [P.O.:360; Other:20] Out: 024 [Urine:675]  Physical Exam: General: NAD, laying in the bed  Head: Normocephalic, atraumatic.   Eyes: Anicteric  Neck: Supple, trachea  Lungs:  Clear to auscultation normal effort  Heart: S1S2 no rubs  Abdomen:  Soft, nontender, BS present.   Extremities: no peripheral edema.  Neurologic: Resting quietly  Skin: No acute lesions   GU Bilateral nephrostomy tubes    Basic Metabolic Panel: Recent Labs  Lab 11/27/17 0543 11/28/17 0922 11/29/17 0518 11/30/17 0732 12/01/17 0642  NA 143 138 140 143 143  K 3.2* 3.6 3.7 3.8 3.9  CL 109 108 112* 116* 116*  CO2 24 23 21* 20* 21*  GLUCOSE 124* 108* 106* 103* 111*  BUN 34* 29* 26* 28* 30*  CREATININE 2.19* 1.78* 1.83* 1.88* 1.57*  CALCIUM 9.0 8.7* 8.9 8.7* 8.5*  MG 1.8  --   --   --   --     Liver Function Tests: No results for input(s): AST, ALT, ALKPHOS, BILITOT, PROT, ALBUMIN in the last 168 hours. No results for input(s): LIPASE, AMYLASE in the last 168 hours. Recent Labs  Lab 11/27/17 1645  AMMONIA <9*    CBC: Recent Labs  Lab 11/27/17 0543 11/28/17 0922 11/28/17 1611 11/29/17 0016 11/29/17 1000  HGB 8.6* 7.9* 8.2* 8.4* 8.2*  HCT  --  22.8* 23.2* 23.8* 24.0*    Cardiac Enzymes: No results for input(s): CKTOTAL, CKMB, CKMBINDEX, TROPONINI in the last  168 hours.  BNP: Invalid input(s): POCBNP  CBG: No results for input(s): GLUCAP in the last 168 hours.  Microbiology: Results for orders placed or performed during the hospital encounter of 11/23/17  CULTURE, BLOOD (ROUTINE X 2) w Reflex to ID Panel     Status: None   Collection Time: 11/23/17  3:50 PM  Result Value Ref Range Status   Specimen Description BLOOD L HAND  Final   Special Requests   Final    BOTTLES DRAWN AEROBIC AND ANAEROBIC Blood Culture results may not be optimal due to an inadequate volume of blood received in culture bottles   Culture   Final    NO GROWTH 5 DAYS Performed at [facility], [address] [place], [place] [address]    Report Status 11/28/2017 FINAL  Final  CULTURE, BLOOD (ROUTINE X 2) w Reflex to ID Panel     Status: None   Collection Time: 11/23/17  3:50 PM  Result Value Ref Range Status   Specimen Description BLOOD R HAND  Final   Special Requests   Final    BOTTLES DRAWN AEROBIC AND ANAEROBIC Blood Culture results may not be optimal due to an inadequate volume of blood received in culture bottles   Culture   Final    NO GROWTH 5 DAYS Performed at [facility]  [facility], [address] [place], [place] [address]    Report Status 11/28/2017 FINAL  Final    Coagulation Studies: No results for input(s): LABPROT, INR in the last 72 hours.  Urinalysis: No results for input(s): COLORURINE, LABSPEC, PHURINE, GLUCOSEU, HGBUR, BILIRUBINUR, KETONESUR, PROTEINUR, UROBILINOGEN, NITRITE, LEUKOCYTESUR in the last 72 hours.  Invalid input(s): APPERANCEUR    Imaging: No results found.   Medications:   . sodium chloride Stopped (11/30/17 0721)   . aspirin EC  81 mg Oral Daily  . atorvastatin  40 mg Oral q1800  . clopidogrel  75 mg Oral Daily  . feeding supplement (NEPRO CARB STEADY)  237 mL Oral BID BM  . megestrol  400 mg Oral BID  . multivitamin with minerals  1 tablet Oral Daily  . sodium chloride flush  10 mL Per Tube Q12H    sodium chloride, acetaminophen **OR** acetaminophen, HYDROcodone-acetaminophen, iopamidol, loratadine, morphine injection, ondansetron **OR** ondansetron (ZOFRAN) IV, polyethylene glycol, QUEtiapine, sodium chloride flush, ziprasidone  Assessment/ Plan:  [name] is a 72 y.o. white male with prostate cancer who was admitted to [facility] on 11/23/2017. Nephrostomy tubes bilaterally placed by IR on 8/31.   1. Acute renal failure: baseline creatinine of 1.19, GFR >60 on 09/16/17 2. Obstructive Nephropathy/bilateral hydronephrosis due to metastatic prostate cancer.  3. Hyperkalemia- now hypokalemia 4. Metabolic acidosis 5. Hypernatremia 6. Gross hematuria  Impression Renal function continues to improve.  Creatinine down to 1.57.  Urine output was 2.1 L.  Continue to monitor output out of both nephrostomies.  Hyperkalemia has resolved with serum potassium of 3.9.  Metabolic acidosis also resolved with most recent serum bicarbonate of 21.  Continue to monitor renal parameters.   LOS: 9 [name] 9/9/201910:59 AM

## 2017-12-02 NOTE — Clinical Social Work Note (Signed)
Patient's wife Haddon Fyfe, notified CSW that they have chosen to go to [facility] and are in agreement with discharge today. CSW notified Otila Kluver at [facility] of bed acceptance and discharge today. CSW will continue to coordinate discharge to facility.   [place], [place]

## 2017-12-02 NOTE — Telephone Encounter (Signed)
Copied from [place]. Topic: Inquiry >> Dec 02, 2017 10:47 AM Conception Chancy, NT wrote: Reason for CRM: patient wife is calling and requesting Dr. Silvio Pate to call her back in regards to recommendations for rehab.

## 2017-12-02 NOTE — Telephone Encounter (Signed)
okay

## 2017-12-02 NOTE — Discharge Summary (Signed)
[facility] [place] NAME: [name]    MR#:  [id number]  DATE OF BIRTH:  1945-05-03  DATE OF ADMISSION:  11/23/2017   ADMITTING PHYSICIAN: Gorden Harms, MD  DATE OF DISCHARGE: 12/02/2017  PRIMARY CARE PHYSICIAN: [name], MD   ADMISSION DIAGNOSIS:  Hyperkalemia [E87.5] ARF (acute renal failure) (HCC) [N17.9] Uremic encephalopathy [G93.41, N19] Hypervolemia, unspecified hypervolemia type [E87.70] Acute renal failure superimposed on chronic kidney disease, unspecified CKD stage, unspecified acute renal failure type ([place]) [N17.9, N18.9] DISCHARGE DIAGNOSIS:  Active Problems:   ARF (acute renal failure) (HCC)   Protein-calorie malnutrition, severe  SECONDARY DIAGNOSIS:   Past Medical History:  Diagnosis Date  . Diverticulosis of colon   . Hx of colonic polyps   . Prostate cancer [facility])    HOSPITAL COURSE:  *Acute right CVA Stroke protocol, follow-up on echocardiogram:  low ejection fraction 30%, carotid Dopplers unremarkable, neurology input appreciated. Continue aspirin, statin therapy, added Plavix.  *Acute renal failure Improving. Due tob/lhydronephrosisdue toobstructive uropathy-prostate cancer/mass Discontinued IV fluids for rehydration due to low ejection fraction 30%. avoid nephrotoxic agents, renal ultrasound negative for hydronephrosis Nephrology input appreciated  *Acute bilateral hydronephrosis Resolved Due to obstructive uropathy-prostate cancer/mass  S/p bilateral nephrostomy tube placementby IR Urologyand nephrology input appreciated  *Acute gross right hematuria Resolved Renal ultrasound negative for hydronephrosis  *Acutehypokalemia  Repletedand improved.  *Acute pulmonary edema Due to ARF CAP ruled out -antibiotics discontinued.  *Chronic worsening metastatic prostate cancer follow-up as outpatient for chemo/radiation  *Anemia of chronic  disease Hemoglobinstable  *Acute metabolic encephalopathy and underlying dementia. Improved to baseline.  Generalized weakness.  PT: SNF DISCHARGE CONDITIONS:  Stable, discharge to SNF today. CONSULTS OBTAINED:  Treatment Team:  Lavonia Dana, MD Alyson Ingles Candee Furbish, MD Alexis Goodell, MD DRUG ALLERGIES:   Allergies  Allergen Reactions  . Versed [Midazolam] Other (See Comments)    agitation   DISCHARGE MEDICATIONS:   Allergies as of 12/02/2017      Reactions   Versed [midazolam] Other (See Comments)   agitation      Medication List    STOP taking these medications   dexamethasone 4 MG tablet Commonly known as:  DECADRON   diphenhydramine-acetaminophen 25-500 MG Tabs tablet Commonly known as:  TYLENOL PM   HYDROcodone-acetaminophen 5-325 MG tablet Commonly known as:  NORCO/VICODIN     TAKE these medications   aspirin 81 MG tablet Take 81 mg by mouth every morning.   atorvastatin 40 MG tablet Commonly known as:  LIPITOR Take 1 tablet (40 mg total) by mouth daily at 6 PM.   clopidogrel 75 MG tablet Commonly known as:  PLAVIX Take 1 tablet (75 mg total) by mouth daily.   feeding supplement (NEPRO CARB STEADY) Liqd Take 237 mLs by mouth 2 (two) times daily between meals.   lidocaine-prilocaine cream Commonly known as:  EMLA Apply 1 application topically as needed. Apply to portacath on the day of chemotherapy.   loratadine 10 MG tablet Commonly known as:  CLARITIN Take 10 mg by mouth daily as needed for allergies.   megestrol 40 MG/ML suspension Commonly known as:  MEGACE TAKE 10 MLS (400 MG TOTAL) BY MOUTH 2 (TWO) TIMES DAILY. What changed:  See the new instructions.   multivitamin with minerals Tabs tablet Take 1 tablet by mouth daily.   ondansetron 4 MG tablet Commonly known as:  ZOFRAN Take 1 tablet (4 mg total) by mouth every 8 (eight) hours as needed for  nausea or vomiting.   polyethylene glycol packet Commonly known as:  MIRALAX /  GLYCOLAX Take 17 g by mouth daily as needed for mild constipation.   promethazine 25 MG tablet Commonly known as:  PHENERGAN Take 1 tablet (25 mg total) by mouth every 6 (six) hours as needed for nausea or vomiting.   QUEtiapine 25 MG tablet Commonly known as:  SEROQUEL Take 1 tablet (25 mg total) by mouth at bedtime as needed (anxiety/agitation).        DISCHARGE INSTRUCTIONS:  See AVS.  If you experience worsening of your admission symptoms, develop shortness of breath, life threatening emergency, suicidal or homicidal thoughts you must seek medical attention immediately by calling 911 or calling your MD immediately  if symptoms less severe.  You Must read complete instructions/literature along with all the possible adverse reactions/side effects for all the Medicines you take and that have been prescribed to you. Take any new Medicines after you have completely understood and accpet all the possible adverse reactions/side effects.   Please note  You were cared for by a hospitalist during your hospital stay. If you have any questions about your discharge medications or the care you received while you were in the hospital after you are discharged, you can call the unit and asked to speak with the hospitalist on call if the hospitalist that took care of you is not available. Once you are discharged, your primary care physician will handle any further medical issues. Please note that NO REFILLS for any discharge medications will be authorized once you are discharged, as it is imperative that you return to your primary care physician (or establish a relationship with a primary care physician if you do not have one) for your aftercare needs so that they can reassess your need for medications and monitor your lab values.    On the day of Discharge:  VITAL SIGNS:  Blood pressure 94/70, pulse 89, temperature 98.2 F (36.8 C), temperature source Oral, resp. rate (!) 22, height 6' (1.829 m),  weight 79.8 kg, SpO2 92 %. PHYSICAL EXAMINATION:  GENERAL:  72 y.o.-year-old patient lying in the bed with no acute distress.  EYES: Pupils equal, round, reactive to light and accommodation. No scleral icterus. Extraocular muscles intact.  HEENT: Head atraumatic, normocephalic. Oropharynx and nasopharynx clear.  NECK:  Supple, no jugular venous distention. No thyroid enlargement, no tenderness.  LUNGS: Normal breath sounds bilaterally, no wheezing, rales,rhonchi or crepitation. No use of accessory muscles of respiration.  CARDIOVASCULAR: S1, S2 normal. No murmurs, rubs, or gallops.  ABDOMEN: Soft, non-tender, non-distended. Bowel sounds present. No organomegaly or mass.  EXTREMITIES: No pedal edema, cyanosis, or clubbing.  NEUROLOGIC: Cranial nerves II through XII are intact. Muscle strength 4/5 in all extremities. Sensation intact. Gait not checked.  PSYCHIATRIC: The patient is alert and oriented x 3.  SKIN: No obvious rash, lesion, or ulcer.  DATA REVIEW:   CBC Recent Labs  Lab 11/29/17 1000  HGB 8.2*  HCT 24.0*    Chemistries  Recent Labs  Lab 11/27/17 0543  12/01/17 0642  NA 143   < > 143  K 3.2*   < > 3.9  CL 109   < > 116*  CO2 24   < > 21*  GLUCOSE 124*   < > 111*  BUN 34*   < > 30*  CREATININE 2.19*   < > 1.57*  CALCIUM 9.0   < > 8.5*  MG 1.8  --   --    < > =  values in this interval not displayed.     Microbiology Results  Results for orders placed or performed during the hospital encounter of 11/23/17  CULTURE, BLOOD (ROUTINE X 2) w Reflex to ID Panel     Status: None   Collection Time: 11/23/17  3:50 PM  Result Value Ref Range Status   Specimen Description BLOOD L HAND  Final   Special Requests   Final    BOTTLES DRAWN AEROBIC AND ANAEROBIC Blood Culture results may not be optimal due to an inadequate volume of blood received in culture bottles   Culture   Final    NO GROWTH 5 DAYS Performed at [facility], [address] [place], [place]  [address]    Report Status 11/28/2017 FINAL  Final  CULTURE, BLOOD (ROUTINE X 2) w Reflex to ID Panel     Status: None   Collection Time: 11/23/17  3:50 PM  Result Value Ref Range Status   Specimen Description BLOOD R HAND  Final   Special Requests   Final    BOTTLES DRAWN AEROBIC AND ANAEROBIC Blood Culture results may not be optimal due to an inadequate volume of blood received in culture bottles   Culture   Final    NO GROWTH 5 DAYS Performed at [facility], [address], [place], [place] [address]    Report Status 11/28/2017 FINAL  Final    RADIOLOGY:  No results found.   Management plans discussed with the patient, his wife and they are in agreement.  CODE STATUS: Full Code   TOTAL TIME TAKING CARE OF THIS PATIENT: 33 minutes.    Demetrios Loll M.D on 12/02/2017 at 10:10 AM  Between 7am to 6pm - Pager - [phone]  After 6pm go to [email or web address] - Proofreader  [facility]  Office  [phone]  CC: Primary care physician; [name], MD   Note: This dictation was prepared with Dragon dictation along with smaller phrase technology. Any transcriptional errors that result from this process are unintentional.

## 2017-12-02 NOTE — Discharge Instructions (Addendum)
Please make an appointment with Dr. Silvio Pate to discuss ongoing treatment of hallucinations and agitated behavior.  Please return to the emergency department for any symptoms concerning to you.

## 2017-12-02 NOTE — ED Provider Notes (Signed)
[facility] Emergency Department Provider Note  ____________________________________________  Time seen: Approximately 10:30 PM  I have reviewed the triage vital signs and the nursing notes.   HISTORY  Chief Complaint Altered Mental Status    HPI [name] is a 72 y.o. 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  The patient is able to give some history but does have some level of confusion.  His wife is here, who corroborates that for the past 2 weeks since his admission, he has been confused, worse in the evenings, consistent with sundowning and has also been having hallucinations such as seeing small birds or insects on the blanket.  Today, the patient was discharged and went to rehab, and stated that he did not agree and wanted to go home.  When he was told he was not able to go home, he became acutely agitated and began to be physically violent towards the staff so EMS was called.  The patient states that he has been married for 50 years and he just wishes to sleep in the bed with his wife.  He does have some confusion during our interview, with nonsensical comments, though he is alert and oriented x4.   Past Medical History:  Diagnosis Date  . Diverticulosis of colon   . Hx of colonic polyps   . Prostate cancer [facility])     Patient Active Problem List   Diagnosis Date Noted  . Protein-calorie malnutrition, severe 11/24/2017  . ARF (acute renal failure) ([place]) 11/23/2017  . Port-A-Cath in place 06/18/2017  . Encounter for antineoplastic chemotherapy 11/21/2016  . Acute seasonal allergic rhinitis due to pollen 12/28/2015  . Prostate cancer metastatic to multiple sites ([place]) 12/24/2014  . Advance directive discussed with patient 12/24/2014  . Prostate cancer ([place]) 02/23/2014  . Benign  paroxysmal positional vertigo 12/07/2013  . Back pain 12/09/2012  . Goals of care, counseling/discussion 07/14/2010  . DIVERTICULOSIS, COLON 10/08/2006    Past Surgical History:  Procedure Laterality Date  . CARDIOVASCULAR STRESS TEST  1996   Negative  . IR FLUORO GUIDE PORT INSERTION RIGHT  01/09/2017  . IR NEPHROSTOGRAM LEFT INITIAL PLACEMENT  11/23/2017  . IR US GUIDE VASC ACCESS RIGHT  01/09/2017  . PORTACATH PLACEMENT      Current Outpatient Rx  . Order #: [id number] Class: Historical Med  . Order #: [id number] Class: No Print  . Order #: [id number] Class: No Print  . Order #: [id number] Class: Normal  . Order #: [id number] Class: Historical Med  . Order #: [id number] Class: Normal  . Order #: [id number] Class: No Print  . Order #: [id number] Class: No Print  . Order #: [id number] Class: Normal  . Order #: [id number] Class: No Print  . Order #: [id number] Class: Normal  . Order #: [id number] Class: No Print    Allergies Versed [midazolam]  Family History  Problem Relation Age of Onset  . Heart attack Father   . Heart disease Father   . Heart attack Brother 67  . Heart disease Brother   . Heart attack Brother   . Heart disease Brother   . Cancer Sister        breast  . Hypertension Mother   . Diabetes Sister   . Cancer Maternal Aunt   . Cancer Maternal Aunt   . Asthma Son     Social History Social History   Tobacco Use  .  Smoking status: Former Smoker    Packs/day: 1.00    Years: 10.00    Pack years: 10.00    Types: Cigarettes    Last attempt to quit: 03/26/1990    Years since quitting: 27.7  . Smokeless tobacco: Never Used  Substance Use Topics  . Alcohol use: No    Frequency: Never  . Drug use: No    Review of Systems Constitutional: No fever/chills.  No lightheadedness or syncope.   Eyes: No visual changes. ENT: No sore throat. No congestion or rhinorrhea. Cardiovascular: Denies chest pain. Denies palpitations. Respiratory: Denies shortness of breath.  No  cough. Gastrointestinal: No abdominal pain.  No nausea, no vomiting.  No diarrhea.  No constipation. Genitourinary: No longer urinates; bilateral nephrostomy tubes. Musculoskeletal: Negative for back pain. Skin: Negative for rash. Neurological: Negative for headaches. No focal numbness, tingling or weakness.  Psychiatric:Positive agitation.  Sedative visual hallucinations. ____________________________________________   PHYSICAL EXAM:  VITAL SIGNS: ED Triage Vitals  Enc Vitals Group     BP 12/02/17 1859 (!) 142/89     Pulse Rate 12/02/17 1859 87     Resp 12/02/17 1859 18     Temp 12/02/17 1859 98.2 F (36.8 C)     Temp src --      SpO2 12/02/17 1859 100 %     Weight 12/02/17 1858 176 lb (79.8 kg)     Height 12/02/17 1858 6' (1.829 m)     Head Circumference --      [facility] Flow --      Pain Score 12/02/17 1858 0     Pain Loc --      Pain Edu? --      Excl. in [place]? --     Constitutional: Alert and oriented x 4, although does have some confusion throughout our discussion. Answers questions appropriately. Eyes: Conjunctivae are normal.  EOMI. No scleral icterus. Head: Atraumatic. Nose: No congestion/rhinnorhea. Mouth/Throat: Mucous membranes are moist.  Neck: No stridor.  Supple.  No JVD.  No meningismus. Cardiovascular: Normal rate, regular rhythm. No murmurs, rubs or gallops.  Respiratory: Normal respiratory effort.  No accessory muscle use or retractions. Lungs CTAB.  No wheezes, rales or ronchi. Gastrointestinal: Soft, nontender and nondistended.  No guarding or rebound.  No peritoneal signs. Genitourinary: Bilateral nephrostomy tubes that are clean dry and intact without any surrounding erythema, swelling or purulent discharge.  The right tube is out putting a clear urine in the left tube has bloody output mixed with urine peer. Musculoskeletal: No LE edema.Neurologic:  A&Ox3.  Speech is clear.  Face and smile are symmetric.  EOMI.  Moves all extremities well. Skin:  Skin is  warm, dry Psychiatric: Here, the patient is occasionally confused and does admit to hallucinations but is not agitated.  He is cooperative  ____________________________________________   LABS (all labs ordered are listed, but only abnormal results are displayed)  Labs Reviewed  CBC - Abnormal; Notable for the following components:      Result Value   RBC 2.52 (*)    Hemoglobin 7.6 (*)    HCT 22.4 (*)    All other components within normal limits  COMPREHENSIVE METABOLIC PANEL - Abnormal; Notable for the following components:   Chloride 113 (*)    CO2 19 (*)    Glucose, Bld 104 (*)    BUN 40 (*)    Creatinine, Ser 1.74 (*)    GFR calc non Af Amer 38 (*)    GFR calc Af Wyvonnia Lora  44 (*)    All other components within normal limits  URINALYSIS, COMPLETE (UACMP) WITH MICROSCOPIC - Abnormal; Notable for the following components:   Color, Urine RED (*)    APPearance TURBID (*)    Glucose, UA   (*)    Value: TEST NOT REPORTED DUE TO COLOR INTERFERENCE OF URINE PIGMENT   Hgb urine dipstick   (*)    Value: TEST NOT REPORTED DUE TO COLOR INTERFERENCE OF URINE PIGMENT   Bilirubin Urine   (*)    Value: TEST NOT REPORTED DUE TO COLOR INTERFERENCE OF URINE PIGMENT   Ketones, ur   (*)    Value: TEST NOT REPORTED DUE TO COLOR INTERFERENCE OF URINE PIGMENT   Protein, ur   (*)    Value: TEST NOT REPORTED DUE TO COLOR INTERFERENCE OF URINE PIGMENT   Nitrite   (*)    Value: TEST NOT REPORTED DUE TO COLOR INTERFERENCE OF URINE PIGMENT   Leukocytes, UA   (*)    Value: TEST NOT REPORTED DUE TO COLOR INTERFERENCE OF URINE PIGMENT   RBC / HPF >50 (*)    All other components within normal limits  URINALYSIS, COMPLETE (UACMP) WITH MICROSCOPIC - Abnormal; Notable for the following components:   Color, Urine YELLOW (*)    APPearance CLEAR (*)    Hgb urine dipstick MODERATE (*)    Protein, ur 30 (*)    All other components within normal limits   ____________________________________________  EKG  ED  ECG REPORT I, Anne-Caroline Mariea Clonts, the attending physician, personally viewed and interpreted this ECG.   Date: 12/03/2017  EKG Time: 1855  Rate: 92  Rhythm: normal sinus rhythm  Axis: leftward  Intervals:none  ST&T Change: No STEMI  ____________________________________________  RADIOLOGY  No results found.  ____________________________________________   PROCEDURES  Procedure(s) performed: None  Procedures  Critical Care performed: No ____________________________________________   INITIAL IMPRESSION / ASSESSMENT AND PLAN / ED COURSE  Pertinent labs & imaging results that were available during my care of the patient were reviewed by me and considered in my medical decision making (see chart for details).  72 y.o. now discharged to rehab today after admission for uropathy from prostate mass resulting in bilateral nephrostomy tubes, as well as acute CVA, presenting with agitation.  Overall, the patient is hemodynamically stable and afebrile.  Given the history his wife is telling me, the patient has been having confusion with hallucinations for a large period of time and this is not new.  The only thing that is new is that he became agitated and violent tonight.  The patient reports that he just wishes to be able to sleep in the same but it is his wife.  Here, he is calm and cooperative, and indicates understanding.  ----------------------------------------- 10:30 PM on 12/02/2017 -----------------------------------------  The patient's work-up in the emergency department has been reassuring.  He has no evidence of a new clinical condition does not require admission to the hospital today.  We have spoken with his rehab facility, who will accept him back.  He has received Ativan prior to discharge, and instructions to make a plan with his primary care physician tomorrow for treatment of ongoing hallucinations and agitated  behavior.  ____________________________________________  FINAL CLINICAL IMPRESSION(S) / ED DIAGNOSES  Final diagnoses:  Sundowning  Hallucination         NEW MEDICATIONS STARTED DURING THIS VISIT:  New Prescriptions   No medications on file      Eula Listen, MD 12/03/17  0036  

## 2017-12-02 NOTE — Telephone Encounter (Signed)
I spoke with Mrs [name] (DPR signed); Pt needs some exercise to be able to stand up and walk on his own. Pt still has confusion due to kidney issue and his ejection fraction is 35% due to fluid around heart. Pt's wife wants to know Dr Alla German opinion about where pt should go for rehab. Mrs [name] needs to make decision today and request cb ASAP. Pt is still in hospital and plans to discharge pt this afternoon and wife is not setup for care at home. Mrs [name] understands no beds at [facility] or [facility] until end of week.Please advise.

## 2017-12-02 NOTE — ED Notes (Signed)
Called [facility] and spoke with Armandina Stammer regarding patient coming back to facility. Per Altha Harm "how do I put this? Pt is a DNR (Do not Return). Altha Harm states that he was very combative with staff and they had to shut down unit so he wouldn't hurt the patients and they also had to call out [employer]. Advised her that our EDP could provide a prescription for Ativan to help him through the night until his doctor could see in the am and that pt has not been combative at all with any staff here in the Emergency Department. Altha Harm states no they will not take him back. RN Runner, broadcasting/film/video notified.

## 2017-12-02 NOTE — Clinical Social Work Note (Signed)
Patient is medically ready for discharge today. CSW notified patient and wife, Fenix Rorke at bedside of discharge to [facility] today. CSW also notified Tina at [employer] [employer]. Patient will be transported by EMS. RN will call report and call for transport.   [place], [place]

## 2017-12-02 NOTE — Telephone Encounter (Signed)
Mrs Butikofer notified as instructed and she voiced understanding. Mrs Madry appreciates Dr Alla German help and request cb if anything could be worked out for [facility]. Mrs Frisk in the meantime will contact the social worker at the hospital and advise trying to work something out at [facility] but if that does not go thru would prefer [facility].

## 2017-12-02 NOTE — ED Triage Notes (Signed)
Pt comes via [facility] from [facility] with c/o AMS and patient was being combative toward staff. Wife of pt states that pt was recently seen in hospital and dx with stroke. Wife states he was then d/c to [facility] for Rehab. [facility] stated to wife of patient that he needed to go home bc he is walking just fine. Pt then became combative with staff per wife and EMS and facility couldn't give injections to calm him down. EMS was called out. Wife states she also wants him to be IVC and find out what is going on with his mind. Pt is alert and talking with staff at this time. Pt is expressing some confusion intermediately.

## 2017-12-02 NOTE — ED Notes (Signed)
Report given to Christine RN at [facility].  

## 2017-12-02 NOTE — Plan of Care (Signed)
  Problem: Education: Goal: Knowledge of General Education information will improve Description Including pain rating scale, medication(s)/side effects and non-pharmacologic comfort measures Outcome: Adequate for Discharge   Problem: Health Behavior/Discharge Planning: Goal: Ability to manage health-related needs will improve Outcome: Adequate for Discharge   Problem: Clinical Measurements: Goal: Ability to maintain clinical measurements within normal limits will improve Outcome: Adequate for Discharge Goal: Will remain free from infection Outcome: Adequate for Discharge Goal: Diagnostic test results will improve Outcome: Adequate for Discharge Goal: Respiratory complications will improve Outcome: Adequate for Discharge Goal: Cardiovascular complication will be avoided Outcome: Adequate for Discharge   Problem: Activity: Goal: Risk for activity intolerance will decrease Outcome: Adequate for Discharge   Problem: Nutrition: Goal: Adequate nutrition will be maintained Outcome: Adequate for Discharge   Problem: Coping: Goal: Level of anxiety will decrease Outcome: Adequate for Discharge   Problem: Pain Managment: Goal: General experience of comfort will improve Outcome: Adequate for Discharge   Problem: Spiritual Needs Goal: Ability to function at adequate level Outcome: Adequate for Discharge

## 2017-12-02 NOTE — Care Management Important Message (Signed)
Important Message  Patient Details  Name: [name] MRN: [id number] Date of Birth: 04/03/1945   Medicare Important Message Given:  Yes    Juliann Pulse A Brandonn Capelli 12/02/2017, 10:38 AM

## 2017-12-02 NOTE — ED Notes (Signed)
Port flushed and de accessed at this time. Bandage applied

## 2017-12-02 NOTE — Telephone Encounter (Signed)
Patients wife Quita Skye calling to get advice again on care for the patient. He is paranoid and has anxiety and she needs to know if he needs to be committed for behavioral health. Facility he is care of in not equipped to handle his condition.

## 2017-12-02 NOTE — Telephone Encounter (Signed)
Unfortunately, there are no beds at [facility]

## 2017-12-02 NOTE — Telephone Encounter (Signed)
I do not see the options they have been given (in the social worker's notes). The nicest building is [facility] and some people are very happy with the care. [facility] is pretty spotty [facility] is not that great  I will email my admission coordinator at [facility] to see if there is anything she can do

## 2017-12-02 NOTE — Clinical Social Work Note (Signed)
CSW met with patient and wife Ashon Rosenberg at bedside. CSW gave patient and wife bed offers. Unfortunately [facility] is unable to offer a bed at this time. Patient and wife are both disappointed by this. CSW explained that the doctor has already discharged patient and we will need a decision as soon as possible for discharge today. Patient and wife are unhappy with the available SNF options. Wife states that they will appeal this discharge decision until [facility] or [facility] has a bed available. CSW explained that neither facility will have beds available this week and we can not hold patient until those beds are available. Patient states that he would like to speak with his PCP before making a decision. CSW notified RN CM of patient's wish to appeal discharge decision. CSW will continue to follow for discharge planning.   [place], [place]

## 2017-12-02 NOTE — Telephone Encounter (Signed)
Mrs Simko notified as instructed and voiced understanding and was appreciative for Dr Silvio Pate trying to get pt at [facility] and also appreciated his other possible suggestions; Mrs Koelzer is waiting on care mgr at hospital to get back with her to request placement at [facility]. FYI to Dr Silvio Pate.

## 2017-12-03 ENCOUNTER — Telehealth: Payer: Self-pay | Admitting: *Deleted

## 2017-12-03 ENCOUNTER — Ambulatory Visit: Payer: Medicare Other

## 2017-12-03 ENCOUNTER — Encounter: Payer: Self-pay | Admitting: Oncology

## 2017-12-03 DIAGNOSIS — E785 Hyperlipidemia, unspecified: Secondary | ICD-10-CM | POA: Diagnosis not present

## 2017-12-03 DIAGNOSIS — C61 Malignant neoplasm of prostate: Secondary | ICD-10-CM | POA: Diagnosis not present

## 2017-12-03 DIAGNOSIS — N189 Chronic kidney disease, unspecified: Secondary | ICD-10-CM | POA: Diagnosis not present

## 2017-12-03 DIAGNOSIS — I6350 Cerebral infarction due to unspecified occlusion or stenosis of unspecified cerebral artery: Secondary | ICD-10-CM | POA: Diagnosis not present

## 2017-12-03 DIAGNOSIS — G9340 Encephalopathy, unspecified: Secondary | ICD-10-CM | POA: Diagnosis not present

## 2017-12-03 DIAGNOSIS — D649 Anemia, unspecified: Secondary | ICD-10-CM | POA: Diagnosis not present

## 2017-12-03 DIAGNOSIS — Z87448 Personal history of other diseases of urinary system: Secondary | ICD-10-CM | POA: Diagnosis not present

## 2017-12-03 DIAGNOSIS — R4182 Altered mental status, unspecified: Secondary | ICD-10-CM | POA: Diagnosis not present

## 2017-12-03 NOTE — Telephone Encounter (Signed)
Spoke to pt's wife. He did not rest well last night after the ER at [facility]. Could he get something to help him rest? She was not sure about who should prescribe that for him.

## 2017-12-03 NOTE — Telephone Encounter (Signed)
Extended message left He will need to be treated by the MD at [facility]. Recommended she email me on MyChart if she has specific questions or still needs to talk to me

## 2017-12-03 NOTE — Telephone Encounter (Signed)
I saw the ER evaluation last night Sometimes antipsychotic medication is needed. There are some inpatient units for severely agitated patients--but they are not local Without seeing him myself, it is hard to tell what is needed----have to trust the physician and staff there about the next steps

## 2017-12-03 NOTE — Telephone Encounter (Signed)
Lm requesting return call to complete TCM and confirm hosp f/u appt  

## 2017-12-04 ENCOUNTER — Telehealth: Payer: Self-pay | Admitting: Radiation Oncology

## 2017-12-04 ENCOUNTER — Telehealth: Payer: Self-pay | Admitting: *Deleted

## 2017-12-04 ENCOUNTER — Telehealth: Payer: Self-pay | Admitting: Internal Medicine

## 2017-12-04 ENCOUNTER — Ambulatory Visit: Payer: Medicare Other

## 2017-12-04 NOTE — Telephone Encounter (Signed)
Attempted to complete TCM; per person at pts home, pt is currently admitted to a rehab facility

## 2017-12-04 NOTE — Progress Notes (Signed)
Phoned patient's mobile device hoping to speak with his wife. Noted EMS was called to [facility] after patient was combative with staff and transported to the emergency room. Patient's daughter, Kelle Darting, answered the phone. She explained that the emergency room found no cause for admission and sent him back to [facility]. She explained that her father was put on Seroquel. She goes onto say the plan is to discharge him home from [facility] on Monday, September 16th. Walker Shadow for the update and explained this RN will be in contact in a few days. She verbalized understanding and expressed appreciation for the call.

## 2017-12-04 NOTE — Telephone Encounter (Unsigned)
Copied from [place] [id number]. Topic: Quick Communication - See Telephone Encounter >> Dec 04, 2017  4:21 PM Neva Seat wrote: Pt being discharged from [facility] on 12-09-17.  Pt needing a home visit due to congnitive issues.  Zade Falkner at [phone]

## 2017-12-04 NOTE — Telephone Encounter (Signed)
Please see 12/04/17 pt message.

## 2017-12-04 NOTE — Telephone Encounter (Signed)
In rehab and should only have follow up made when leaving there

## 2017-12-04 NOTE — Telephone Encounter (Signed)
Per dr [name]. He did not want to prescribe any more meds for sleep. Patient was started on seroquel on Monday and sleep patterns should improve with time. Daughter kristin verbalized understanding.

## 2017-12-04 NOTE — Telephone Encounter (Signed)
Copied from [place] [id number]. Topic: Quick Communication - See Telephone Encounter >> Dec 04, 2017  4:25 PM Neva Seat wrote: Pt being discharged from [facility] on 12-09-17. Pt needing a hospital f/u home visit due to congnitive issues. Please contact Shaman Muscarella at [phone].

## 2017-12-05 ENCOUNTER — Ambulatory Visit: Payer: Medicare Other

## 2017-12-05 NOTE — Telephone Encounter (Signed)
Please check with wife but plan to recheck on Monday once he gets home. If he can't make it in to the office, I can probably see him later next week or certainly by the week after

## 2017-12-05 NOTE — Telephone Encounter (Signed)
Lm on pt's wife vm and requested a call back. Ok to relay Dr Alla German comments should pt return call

## 2017-12-06 ENCOUNTER — Ambulatory Visit: Payer: Medicare Other

## 2017-12-09 ENCOUNTER — Ambulatory Visit: Payer: Medicare Other

## 2017-12-09 NOTE — Telephone Encounter (Signed)
Pt is home and Dr Silvio Pate is going out to see him Wednesday

## 2017-12-10 ENCOUNTER — Ambulatory Visit: Payer: Medicare Other

## 2017-12-10 ENCOUNTER — Telehealth: Payer: Self-pay | Admitting: Radiation Oncology

## 2017-12-10 NOTE — Progress Notes (Signed)
Phoned patient's home to inquire about status as promised. Spoke with patient's wife, Quita Skye. She reports her husband was discharged home from [facility] yesterday. She explains Dr. Silvio Pate plans to make a home visit tomorrow to follow up since "they don't feel they can get him into the office safely." Ms. Satterwhite reports her husband's confusion is improving slowly. She reports the nephrostomy tubes remain in place and that she cares for them. She reports having a caregiver in the home around the clock. Ms. Amparan reports her stress level is high related to her husband's illness and her impending breast biopsy. Explained this RN will update Dr. Tammi Klippel of these findings and phone back at a later date to inquire if Mr. Dilley's confusion has lifted. Also, instructed Ms. Honor to phone this RN with needs or questions related to radiation therapy. She verbalized understanding of all discussed.

## 2017-12-11 ENCOUNTER — Encounter: Payer: Self-pay | Admitting: Internal Medicine

## 2017-12-11 ENCOUNTER — Ambulatory Visit: Payer: Medicare Other | Admitting: Internal Medicine

## 2017-12-11 ENCOUNTER — Ambulatory Visit: Payer: Medicare Other

## 2017-12-11 ENCOUNTER — Other Ambulatory Visit: Payer: Self-pay | Admitting: Internal Medicine

## 2017-12-11 ENCOUNTER — Other Ambulatory Visit: Payer: Self-pay | Admitting: Family Medicine

## 2017-12-11 VITALS — BP 116/64 | HR 94 | Resp 16

## 2017-12-11 DIAGNOSIS — N139 Obstructive and reflux uropathy, unspecified: Secondary | ICD-10-CM

## 2017-12-11 DIAGNOSIS — I5022 Chronic systolic (congestive) heart failure: Secondary | ICD-10-CM | POA: Insufficient documentation

## 2017-12-11 DIAGNOSIS — E441 Mild protein-calorie malnutrition: Secondary | ICD-10-CM

## 2017-12-11 DIAGNOSIS — I639 Cerebral infarction, unspecified: Secondary | ICD-10-CM | POA: Diagnosis not present

## 2017-12-11 DIAGNOSIS — R410 Disorientation, unspecified: Secondary | ICD-10-CM | POA: Diagnosis not present

## 2017-12-11 DIAGNOSIS — C61 Malignant neoplasm of prostate: Secondary | ICD-10-CM

## 2017-12-11 DIAGNOSIS — Z23 Encounter for immunization: Secondary | ICD-10-CM

## 2017-12-11 DIAGNOSIS — E440 Moderate protein-calorie malnutrition: Secondary | ICD-10-CM | POA: Insufficient documentation

## 2017-12-11 NOTE — Assessment & Plan Note (Signed)
EF around 30% Did have some edema but this is better Compensated now but will have them monitor weight

## 2017-12-11 NOTE — Progress Notes (Signed)
Subjective:    Patient ID: [name], male    DOB: [birth year], 72 y.o.   MRN: [id number]  HPI Home visit for hospital follow up----then rehab at [facility] Wife is here and aide  Feels "the whole way this thing came down" was wrong Discussed that he had acute kidney failure and required emergency treatment Wife does note that she was informed about the progress She recalls the delirium after the nephrostomy tubes were placed---felt in jail and he was fighting  Had acute CVA as well Abnormal behaviors had started before admission --but hard to separate out the renal failure This was frontal--so could have been related to the behavior changes No focal weakness  Found to have low EF IV fluids stopped Did have edema---but more on right (no DVT found) Did have SOB--especially with any activity No chest pain No palpitations Some dizziness upon arising Able to lie flat in bed  Went to rehab Back to ER that night due to agitation 1 week there Did get therapy--- supposed to have home health starting (left rehab 2 days ago) Have hired 24 hour care-- [facility] at present  Had been on seroquel due to the hallucinations, etc Wife did stop this as he improved at home Now just giving tylenol pm Sleeping okay---doesn't seem sedated in the AM  Right nephrostomy has stopped draining 2 days now Has not had pain though  Mass noted causing the obstruction Due to have another course of RT  Current Outpatient Medications on File Prior to Visit  Medication Sig Dispense Refill  . atorvastatin (LIPITOR) 40 MG tablet Take 1 tablet (40 mg total) by mouth daily at 6 PM.    . clopidogrel (PLAVIX) 75 MG tablet Take 1 tablet (75 mg total) by mouth daily.    Marland Kitchen lidocaine-prilocaine (EMLA) cream Apply 1 application topically as needed. Apply to portacath on the day of chemotherapy. 30 g 3  . loratadine (CLARITIN) 10 MG tablet Take 10 mg by mouth daily as needed for allergies.    .  megestrol (MEGACE) 40 MG/ML suspension TAKE 10 MLS (400 MG TOTAL) BY MOUTH 2 (TWO) TIMES DAILY. (Patient taking differently: Take 400 mg by mouth 2 (two) times daily. ) 240 mL 0  . ondansetron (ZOFRAN) 4 MG tablet Take 1 tablet (4 mg total) by mouth every 8 (eight) hours as needed for nausea or vomiting. 30 tablet 2  . polyethylene glycol (MIRALAX / GLYCOLAX) packet Take 17 g by mouth daily as needed for mild constipation. 14 each 0  . promethazine (PHENERGAN) 25 MG tablet Take 1 tablet (25 mg total) by mouth every 6 (six) hours as needed for nausea or vomiting. 60 tablet 3   Current Facility-Administered Medications on File Prior to Visit  Medication Dose Route Frequency Provider Last Rate Last Dose  . denosumab (XGEVA) injection 120 mg  120 mg Subcutaneous Once Wyatt Portela, MD        Allergies  Allergen Reactions  . Versed [Midazolam] Other (See Comments)    agitation    Past Medical History:  Diagnosis Date  . Diverticulosis of colon   . Hx of colonic polyps   . Prostate cancer [facility])     Past Surgical History:  Procedure Laterality Date  . CARDIOVASCULAR STRESS TEST  1996   Negative  . IR FLUORO GUIDE PORT INSERTION RIGHT  01/09/2017  . IR NEPHROSTOGRAM LEFT INITIAL PLACEMENT  11/23/2017  . IR US GUIDE VASC ACCESS RIGHT  01/09/2017  .  PORTACATH PLACEMENT      Family History  Problem Relation Age of Onset  . Heart attack Father   . Heart disease Father   . Heart attack Brother 48  . Heart disease Brother   . Heart attack Brother   . Heart disease Brother   . Cancer Sister        breast  . Hypertension Mother   . Diabetes Sister   . Cancer Maternal Aunt   . Cancer Maternal Aunt   . Asthma Son     Social History   Socioeconomic History  . Marital status: Married    Spouse name: Not on file  . Number of children: 3  . Years of education: Not on file  . Highest education level: Not on file  Occupational History  . Occupation: cancer registry reporting    Social Needs  . Financial resource strain: Not on file  . Food insecurity:    Worry: Not on file    Inability: Not on file  . Transportation needs:    Medical: Not on file    Non-medical: Not on file  Tobacco Use  . Smoking status: Former Smoker    Packs/day: 1.00    Years: 10.00    Pack years: 10.00    Types: Cigarettes    Last attempt to quit: 03/26/1990    Years since quitting: 27.7  . Smokeless tobacco: Never Used  Substance and Sexual Activity  . Alcohol use: No    Frequency: Never  . Drug use: No  . Sexual activity: Never  Lifestyle  . Physical activity:    Days per week: Not on file    Minutes per session: Not on file  . Stress: Not on file  Relationships  . Social connections:    Talks on phone: Not on file    Gets together: Not on file    Attends religious service: Not on file    Active member of club or organization: Not on file    Attends meetings of clubs or organizations: Not on file    Relationship status: Not on file  . Intimate partner violence:    Fear of current or ex partner: Not on file    Emotionally abused: Not on file    Physically abused: Not on file    Forced sexual activity: Not on file  Other Topics Concern  . Not on file  Social History Narrative   Has living will   Wife is health care POA.   Would accept resuscitation    No tube feeds if cognitively unaware.   Review of Systems Pain in stomach and back---affecting his sleep. Noticed it with touch Feels his strength is improving Appetite is improving--still on megace Mostly went down after starting xtandi Has clearly lost weight    Objective:   Physical Exam  Constitutional: No distress.  Some wasting  Neck: No thyromegaly present.  Cardiovascular: Normal rate, regular rhythm and normal heart sounds. Exam reveals no gallop.  No murmur heard. Respiratory: Effort normal and breath sounds normal. No respiratory distress. He has no wheezes. He has no rales.  GI: Soft. There is no  tenderness.  Musculoskeletal: He exhibits no edema.  Mild CVA tenderness on right  Lymphadenopathy:    He has no cervical adenopathy.  Neurological:  Calm and appropriate interaction           Assessment & Plan:

## 2017-12-11 NOTE — Assessment & Plan Note (Signed)
Right frontal lesion May have been related to the delirium, etc that took place with the acute renal failure No apparent deficits now

## 2017-12-11 NOTE — Assessment & Plan Note (Signed)
With progression  Large mass causing renal failure May reconsider xtandi or other Rx Getting more RT

## 2017-12-11 NOTE — Assessment & Plan Note (Signed)
Eating some better now Not sure the megace is helping---okay to try to get off once he is eating better

## 2017-12-11 NOTE — Assessment & Plan Note (Signed)
Right nephrostomy blocked again Spoke to [facility] radiology--they will replace the tube tomorrow

## 2017-12-11 NOTE — Assessment & Plan Note (Signed)
Better Likely mostly from the ARF--but the frontal stroke may have played a part Not needing the seroquel Discussed staying away from tylenol PM---regular tylenol okay

## 2017-12-12 ENCOUNTER — Ambulatory Visit: Payer: Medicare Other

## 2017-12-12 ENCOUNTER — Other Ambulatory Visit: Payer: Self-pay | Admitting: Internal Medicine

## 2017-12-12 ENCOUNTER — Other Ambulatory Visit (HOSPITAL_COMMUNITY): Payer: Self-pay | Admitting: Diagnostic Radiology

## 2017-12-12 ENCOUNTER — Encounter: Payer: Self-pay | Admitting: Diagnostic Radiology

## 2017-12-12 ENCOUNTER — Ambulatory Visit
Admission: RE | Admit: 2017-12-12 | Discharge: 2017-12-12 | Disposition: A | Discharge status: Home / Self Care | Payer: Medicare Other | Source: Ambulatory Visit | Attending: Internal Medicine | Admitting: Internal Medicine

## 2017-12-12 ENCOUNTER — Telehealth: Payer: Self-pay | Admitting: Internal Medicine

## 2017-12-12 DIAGNOSIS — N99528 Other complication of other external stoma of urinary tract: Secondary | ICD-10-CM | POA: Diagnosis not present

## 2017-12-12 DIAGNOSIS — I700 Atherosclerosis of aorta: Secondary | ICD-10-CM | POA: Diagnosis not present

## 2017-12-12 DIAGNOSIS — F0390 Unspecified dementia without behavioral disturbance: Secondary | ICD-10-CM | POA: Diagnosis not present

## 2017-12-12 DIAGNOSIS — N189 Chronic kidney disease, unspecified: Secondary | ICD-10-CM | POA: Diagnosis not present

## 2017-12-12 DIAGNOSIS — G9341 Metabolic encephalopathy: Secondary | ICD-10-CM | POA: Diagnosis not present

## 2017-12-12 DIAGNOSIS — Z436 Encounter for attention to other artificial openings of urinary tract: Secondary | ICD-10-CM | POA: Diagnosis not present

## 2017-12-12 DIAGNOSIS — C61 Malignant neoplasm of prostate: Secondary | ICD-10-CM | POA: Diagnosis not present

## 2017-12-12 DIAGNOSIS — D631 Anemia in chronic kidney disease: Secondary | ICD-10-CM | POA: Diagnosis not present

## 2017-12-12 DIAGNOSIS — N139 Obstructive and reflux uropathy, unspecified: Secondary | ICD-10-CM | POA: Diagnosis not present

## 2017-12-12 DIAGNOSIS — Z87891 Personal history of nicotine dependence: Secondary | ICD-10-CM | POA: Diagnosis not present

## 2017-12-12 DIAGNOSIS — N138 Other obstructive and reflux uropathy: Secondary | ICD-10-CM | POA: Diagnosis not present

## 2017-12-12 DIAGNOSIS — E43 Unspecified severe protein-calorie malnutrition: Secondary | ICD-10-CM | POA: Diagnosis not present

## 2017-12-12 DIAGNOSIS — Z79899 Other long term (current) drug therapy: Secondary | ICD-10-CM | POA: Diagnosis not present

## 2017-12-12 DIAGNOSIS — Z7982 Long term (current) use of aspirin: Secondary | ICD-10-CM | POA: Diagnosis not present

## 2017-12-12 DIAGNOSIS — C7951 Secondary malignant neoplasm of bone: Secondary | ICD-10-CM | POA: Diagnosis not present

## 2017-12-12 DIAGNOSIS — N1330 Unspecified hydronephrosis: Secondary | ICD-10-CM

## 2017-12-12 HISTORY — PX: IR FLUORO RM 30-60 MIN: IMG2384

## 2017-12-12 MED ORDER — IOPAMIDOL (ISOVUE-300) INJECTION 61%
5.0000 mL | Freq: Once | INTRAVENOUS | Status: AC | PRN
  Administered 2017-12-12: 5 mL

## 2017-12-12 MED ORDER — BACITRACIN-NEOMYCIN-POLYMYXIN 400-5-5000 EX OINT
TOPICAL_OINTMENT | CUTANEOUS | Status: AC
  Filled 2017-12-12: qty 2

## 2017-12-12 NOTE — Telephone Encounter (Signed)
Okay for the home health orders I would not treat his anxiety with the promethazine Okay to send Rx for vistaril 25mg  tid prn for anxiety (#30 x 0)

## 2017-12-12 NOTE — Telephone Encounter (Signed)
Copied from [place] [id number]. Topic: Quick Communication - See Telephone Encounter >> Dec 12, 2017  3:39 PM Blase Mess A wrote: CRM for notification. See Telephone encounter for: 12/12/17. Haynes Hoehn with [facility] is Calling to get verbal orders.   1 week 1, 3 week 1, 2 week 1, 1 week 7,  And Home Health aide 2x a week Mr. Boozer is complaining of anxiety, restlessness, and not sleeping, requesting promethazine (PHENERGAN) 25 MG tablet [[id number]]  to be increased to 50MG  PRN for Anxiety.    Please advise with Sarah 336 [phone]

## 2017-12-12 NOTE — Addendum Note (Signed)
Addended by: Pilar Grammes on: 12/12/2017 09:50 AM   Modules accepted: Orders

## 2017-12-12 NOTE — Telephone Encounter (Signed)
Spoke to [employer]. She said she was asking about Seroquel 25mg  because he is still on it. Dr Silvio Pate had taken it off because of his delirium. Dr Silvio Pate approved an increase of 25mg  2 at bedtime as needed. I added that back to his med list.

## 2017-12-13 ENCOUNTER — Ambulatory Visit: Payer: Medicare Other

## 2017-12-13 ENCOUNTER — Telehealth: Payer: Self-pay | Admitting: Internal Medicine

## 2017-12-13 DIAGNOSIS — N138 Other obstructive and reflux uropathy: Secondary | ICD-10-CM | POA: Diagnosis not present

## 2017-12-13 DIAGNOSIS — F0390 Unspecified dementia without behavioral disturbance: Secondary | ICD-10-CM | POA: Diagnosis not present

## 2017-12-13 DIAGNOSIS — C61 Malignant neoplasm of prostate: Secondary | ICD-10-CM | POA: Diagnosis not present

## 2017-12-13 DIAGNOSIS — N189 Chronic kidney disease, unspecified: Secondary | ICD-10-CM | POA: Diagnosis not present

## 2017-12-13 DIAGNOSIS — D631 Anemia in chronic kidney disease: Secondary | ICD-10-CM | POA: Diagnosis not present

## 2017-12-13 DIAGNOSIS — Z436 Encounter for attention to other artificial openings of urinary tract: Secondary | ICD-10-CM | POA: Diagnosis not present

## 2017-12-13 NOTE — Telephone Encounter (Signed)
Please ok those verbal orders  

## 2017-12-13 NOTE — Telephone Encounter (Signed)
Verbal order given  

## 2017-12-13 NOTE — Telephone Encounter (Signed)
Copied from [place] [id number]. Topic: Quick Communication - See Telephone Encounter >> Dec 13, 2017  4:06 PM Gardiner Ramus wrote: CRM for notification. See Telephone encounter for: 12/13/17. Jeani Hawking from [facility] calling for verbal orders for PT 2x4. Please advise Cb#[phone]

## 2017-12-13 NOTE — Telephone Encounter (Signed)
Dr Silvio Pate out of office until 12/17/17.Please advise.

## 2017-12-13 NOTE — Telephone Encounter (Signed)
Message sent that Dr Silvio Pate is out of the office until Tuesday and that there may be a delay in the response.

## 2017-12-16 ENCOUNTER — Other Ambulatory Visit: Payer: Self-pay | Admitting: Internal Medicine

## 2017-12-16 ENCOUNTER — Other Ambulatory Visit (HOSPITAL_COMMUNITY): Payer: Self-pay | Admitting: Diagnostic Radiology

## 2017-12-16 ENCOUNTER — Ambulatory Visit: Payer: Medicare Other

## 2017-12-16 ENCOUNTER — Telehealth: Payer: Self-pay | Admitting: Radiation Oncology

## 2017-12-16 DIAGNOSIS — C61 Malignant neoplasm of prostate: Secondary | ICD-10-CM | POA: Diagnosis not present

## 2017-12-16 DIAGNOSIS — N1330 Unspecified hydronephrosis: Secondary | ICD-10-CM

## 2017-12-16 DIAGNOSIS — F0390 Unspecified dementia without behavioral disturbance: Secondary | ICD-10-CM | POA: Diagnosis not present

## 2017-12-16 DIAGNOSIS — D631 Anemia in chronic kidney disease: Secondary | ICD-10-CM | POA: Diagnosis not present

## 2017-12-16 DIAGNOSIS — N138 Other obstructive and reflux uropathy: Secondary | ICD-10-CM | POA: Diagnosis not present

## 2017-12-16 DIAGNOSIS — N189 Chronic kidney disease, unspecified: Secondary | ICD-10-CM | POA: Diagnosis not present

## 2017-12-16 DIAGNOSIS — Z436 Encounter for attention to other artificial openings of urinary tract: Secondary | ICD-10-CM | POA: Diagnosis not present

## 2017-12-16 NOTE — Telephone Encounter (Signed)
Spoke to pt's daughter. Sent request to referral coordinator to see if they could have his appt moved up from Friday.   The family is okay with waiting for Dr Silvio Pate about a sleep aid or if Seroquel was okay.Marland Kitchen

## 2017-12-16 NOTE — Progress Notes (Signed)
Phoned patient's home for status update and thoughts on resuming radiation therapy in the management of large pelvic mass. Patient unavailable. Spoke with patient's wife, Quita Skye. She reports the patient's nephrostomy tube was thought to be blocked but "had actually come out." She reports he is scheduled to have his right nephrostomy tube reinserted this Friday (9/27). She confirms her husband's delirium is improving. She explains he is now having difficulty sleeping. She confirms reaching out to Dr. Silvio Pate reference difficulty sleeping. Wife reports that she is scheduled for a breast biopsy tomorrow. She verbalizes uncertainty about the logistics of getting the patient here for treatment with her biopsy coming up tomorrow and his tube needing to be replaced Friday. She is aware that this RN will inform Dr. Tammi Klippel of these findings. Also, she understands this RN will call her back with further directions. Instructed Ciarah, RT on L2 to cancelled radiation treatment for today.

## 2017-12-17 ENCOUNTER — Ambulatory Visit: Payer: Medicare Other | Admitting: Radiation Oncology

## 2017-12-17 ENCOUNTER — Telehealth: Payer: Self-pay | Admitting: Internal Medicine

## 2017-12-17 ENCOUNTER — Other Ambulatory Visit: Payer: Self-pay | Admitting: Radiology

## 2017-12-17 ENCOUNTER — Ambulatory Visit: Payer: Medicare Other

## 2017-12-17 ENCOUNTER — Other Ambulatory Visit: Payer: Self-pay | Admitting: Student

## 2017-12-17 DIAGNOSIS — C61 Malignant neoplasm of prostate: Secondary | ICD-10-CM | POA: Diagnosis not present

## 2017-12-17 DIAGNOSIS — Z436 Encounter for attention to other artificial openings of urinary tract: Secondary | ICD-10-CM | POA: Diagnosis not present

## 2017-12-17 DIAGNOSIS — N189 Chronic kidney disease, unspecified: Secondary | ICD-10-CM | POA: Diagnosis not present

## 2017-12-17 DIAGNOSIS — N138 Other obstructive and reflux uropathy: Secondary | ICD-10-CM | POA: Diagnosis not present

## 2017-12-17 DIAGNOSIS — D631 Anemia in chronic kidney disease: Secondary | ICD-10-CM | POA: Diagnosis not present

## 2017-12-17 DIAGNOSIS — F0390 Unspecified dementia without behavioral disturbance: Secondary | ICD-10-CM | POA: Diagnosis not present

## 2017-12-17 NOTE — Telephone Encounter (Signed)
Copied from [place] [id number]. Topic: Quick Communication - Rx Refill/Question >> Dec 17, 2017 12:07 PM Cecelia Byars, NT wrote: Medication:  atorvastatin (LIPITOR) 40 MG tablet  Has the patient contacted their pharmacy?  no (Agent: If no, request that the patient contact the pharmacy for the refill. (Agent: If yes, when and what did the pharmacy advise?)Preferred Pharmacy (with phone number or street name  [id number] [address], [place] [phone] (Phone) [phone] (Fax)    Agent: Please be advised that RX refills may take up to 3 business days. We ask that you follow-up with your pharmacy.

## 2017-12-17 NOTE — Telephone Encounter (Signed)
This medication was ordered by a different provider. Pt called and left voicemail to call that provider for refills.

## 2017-12-18 ENCOUNTER — Ambulatory Visit: Payer: Medicare Other

## 2017-12-18 ENCOUNTER — Ambulatory Visit: Payer: Medicare Other | Admitting: Radiation Oncology

## 2017-12-18 ENCOUNTER — Ambulatory Visit
Admission: RE | Admit: 2017-12-18 | Discharge: 2017-12-18 | Disposition: A | Discharge status: Home / Self Care | Payer: Medicare Other | Source: Ambulatory Visit | Attending: Interventional Radiology | Admitting: Interventional Radiology

## 2017-12-18 ENCOUNTER — Ambulatory Visit
Admission: RE | Admit: 2017-12-18 | Discharge: 2017-12-18 | Disposition: A | Discharge status: Home / Self Care | Payer: Medicare Other | Source: Ambulatory Visit | Attending: Diagnostic Radiology | Admitting: Diagnostic Radiology

## 2017-12-18 DIAGNOSIS — Z79899 Other long term (current) drug therapy: Secondary | ICD-10-CM | POA: Insufficient documentation

## 2017-12-18 DIAGNOSIS — Z436 Encounter for attention to other artificial openings of urinary tract: Secondary | ICD-10-CM | POA: Diagnosis not present

## 2017-12-18 DIAGNOSIS — Z8249 Family history of ischemic heart disease and other diseases of the circulatory system: Secondary | ICD-10-CM | POA: Diagnosis not present

## 2017-12-18 DIAGNOSIS — Z8719 Personal history of other diseases of the digestive system: Secondary | ICD-10-CM | POA: Insufficient documentation

## 2017-12-18 DIAGNOSIS — Z7902 Long term (current) use of antithrombotics/antiplatelets: Secondary | ICD-10-CM | POA: Insufficient documentation

## 2017-12-18 DIAGNOSIS — Z9889 Other specified postprocedural states: Secondary | ICD-10-CM | POA: Diagnosis not present

## 2017-12-18 DIAGNOSIS — Z888 Allergy status to other drugs, medicaments and biological substances status: Secondary | ICD-10-CM | POA: Insufficient documentation

## 2017-12-18 DIAGNOSIS — Z87891 Personal history of nicotine dependence: Secondary | ICD-10-CM | POA: Diagnosis not present

## 2017-12-18 DIAGNOSIS — N1330 Unspecified hydronephrosis: Secondary | ICD-10-CM | POA: Insufficient documentation

## 2017-12-18 DIAGNOSIS — Z8546 Personal history of malignant neoplasm of prostate: Secondary | ICD-10-CM | POA: Insufficient documentation

## 2017-12-18 DIAGNOSIS — N139 Obstructive and reflux uropathy, unspecified: Secondary | ICD-10-CM | POA: Diagnosis not present

## 2017-12-18 DIAGNOSIS — Z86010 Personal history of colonic polyps: Secondary | ICD-10-CM | POA: Diagnosis not present

## 2017-12-18 HISTORY — PX: IR NEPHROSTOMY PLACEMENT RIGHT: IMG6064

## 2017-12-18 LAB — CBC
HCT: 27.2 % — ABNORMAL LOW (ref 40.0–52.0)
HEMOGLOBIN: 9.6 g/dL — AB (ref 13.0–18.0)
MCH: 30.9 pg (ref 26.0–34.0)
MCHC: 35.3 g/dL (ref 32.0–36.0)
MCV: 87.7 fL (ref 80.0–100.0)
Platelets: 358 10*3/uL (ref 150–440)
RBC: 3.1 MIL/uL — ABNORMAL LOW (ref 4.40–5.90)
RDW: 15.3 % — ABNORMAL HIGH (ref 11.5–14.5)
WBC: 4.6 10*3/uL (ref 3.8–10.6)

## 2017-12-18 LAB — BASIC METABOLIC PANEL
Anion gap: 9 (ref 5–15)
BUN: 29 mg/dL — ABNORMAL HIGH (ref 8–23)
CO2: 18 mmol/L — AB (ref 22–32)
CREATININE: 1.39 mg/dL — AB (ref 0.61–1.24)
Calcium: 9.1 mg/dL (ref 8.9–10.3)
Chloride: 113 mmol/L — ABNORMAL HIGH (ref 98–111)
GFR calc non Af Amer: 50 mL/min — ABNORMAL LOW (ref >60)
GFR, EST AFRICAN AMERICAN: 58 mL/min — AB (ref >60)
Glucose, Bld: 97 mg/dL (ref 70–99)
Potassium: 4.4 mmol/L (ref 3.5–5.1)
SODIUM: 140 mmol/L (ref 135–145)

## 2017-12-18 LAB — APTT: APTT: 28 s (ref 24–36)

## 2017-12-18 LAB — PROTIME-INR
INR: 1.04
Prothrombin Time: 13.5 seconds (ref 11.4–15.2)

## 2017-12-18 MED ORDER — HEPARIN SOD (PORK) LOCK FLUSH 100 UNIT/ML IV SOLN
INTRAVENOUS | Status: AC
  Administered 2017-12-18: 500 [IU] via INTRAVENOUS
  Filled 2017-12-18: qty 5

## 2017-12-18 MED ORDER — SODIUM CHLORIDE 0.9 % IV SOLN
INTRAVENOUS | Status: DC
  Administered 2017-12-18: 14:00:00 via INTRAVENOUS

## 2017-12-18 MED ORDER — CIPROFLOXACIN IN D5W 400 MG/200ML IV SOLN
400.0000 mg | INTRAVENOUS | Status: AC
  Administered 2017-12-18: 400 mg via INTRAVENOUS

## 2017-12-18 MED ORDER — HEPARIN SOD (PORK) LOCK FLUSH 100 UNIT/ML IV SOLN
500.0000 [IU] | Freq: Once | INTRAVENOUS | Status: AC
  Administered 2017-12-18: 500 [IU] via INTRAVENOUS
  Filled 2017-12-18: qty 5

## 2017-12-18 MED ORDER — FENTANYL CITRATE (PF) 100 MCG/2ML IJ SOLN
INTRAMUSCULAR | Status: AC | PRN
  Administered 2017-12-18 (×2): 25 ug via INTRAVENOUS

## 2017-12-18 MED ORDER — CIPROFLOXACIN IN D5W 400 MG/200ML IV SOLN
INTRAVENOUS | Status: AC
  Filled 2017-12-18: qty 200

## 2017-12-18 MED ORDER — IOPAMIDOL (ISOVUE-300) INJECTION 61%
30.0000 mL | Freq: Once | INTRAVENOUS | Status: AC | PRN
  Administered 2017-12-18: 10 mL via INTRAVENOUS

## 2017-12-18 MED ORDER — LIDOCAINE HCL (PF) 1 % IJ SOLN
INTRAMUSCULAR | Status: AC | PRN
  Administered 2017-12-18: 10 mL

## 2017-12-18 MED ORDER — FENTANYL CITRATE (PF) 100 MCG/2ML IJ SOLN
INTRAMUSCULAR | Status: AC
  Filled 2017-12-18: qty 2

## 2017-12-18 MED ORDER — MIDAZOLAM HCL 2 MG/2ML IJ SOLN
INTRAMUSCULAR | Status: AC
  Filled 2017-12-18: qty 2

## 2017-12-18 MED ORDER — LIDOCAINE HCL (PF) 1 % IJ SOLN
INTRAMUSCULAR | Status: AC
  Filled 2017-12-18: qty 30

## 2017-12-18 MED ORDER — ATORVASTATIN CALCIUM 40 MG PO TABS: 40.0000 mg | ORAL_TABLET | Freq: Every day | ORAL | 3 refills | Status: DC

## 2017-12-18 NOTE — Procedures (Signed)
Pre Procedure Dx: Hydronephrosis Post Procedural Dx: Same  Successful [place] and fluoroscopic guided placement of a left sided PCN with end coiled and locked within the renal pelvis. PCN connected to gravity bag.  Contrast injection confirms appropriate positioning and functionality of the right sided PCN.  EBL: Minimal  Complications: None immediate.  Ronny Bacon, MD Pager #: [phone]

## 2017-12-18 NOTE — Consult Note (Signed)
Chief Complaint: Intermittent removal of right-sided nephrostomy  Referring Physician(s): Shaddad (Oncology) Tammi Klippel (Urology)  Patient Status: [facility] - Out-pt  History of Present Illness: [name] is a 72 y.o. male with past no history significant for prostate cancer who subsequently developed bilateral obstructive uropathy requiring placement of bilateral percutaneous nephrostomy catheters on 11/23/2017 by my IR partner, Dr. Annamaria Boots.  Unfortunately, the right sided nephrostomy catheter was inadvertently removed and attempted fluoroscopic guided recanalization on 12/12/2017 was unsuccessful.    As such, the patient presents today for repeat attempted right-sided percutaneous nephrostomy catheter placement.  The patient is accompanied by his wife who helps provide appropriate history.  Patient is overall improved since his most recent admission.  Specifically, patient has improved mental status.  He continues to complain of lower abdominal/pelvic pain, worse during rising from a seated position.  He is otherwise without complaint.  Specifically, he denies right-sided flank pain.  He states the left-sided nephrostomy catheter is working well.  No significant associated hematuria.  No fever or chills.  No chest pain or shortness of breath.   Past Medical History:  Diagnosis Date  . Diverticulosis of colon   . Hx of colonic polyps   . Prostate cancer [facility])     Past Surgical History:  Procedure Laterality Date  . CARDIOVASCULAR STRESS TEST  1996   Negative  . IR FLUORO GUIDE PORT INSERTION RIGHT  01/09/2017  . IR FLUORO RM 30-60 MIN  12/12/2017  . IR NEPHROSTOGRAM LEFT INITIAL PLACEMENT  11/23/2017  . IR US GUIDE VASC ACCESS RIGHT  01/09/2017  . PORTACATH PLACEMENT      Allergies: Versed [midazolam]  Medications: Prior to Admission medications   Medication Sig Start Date End Date Taking? Authorizing Provider  atorvastatin (LIPITOR) 40 MG tablet Take 1 tablet (40 mg total)  by mouth daily at 6 PM. 12/18/17  Yes [name], MD  lidocaine-prilocaine (EMLA) cream Apply 1 application topically as needed. Apply to portacath on the day of chemotherapy. 01/23/17  Yes Wyatt Portela, MD  loratadine (CLARITIN) 10 MG tablet Take 10 mg by mouth daily as needed for allergies.   Yes [provider]  ondansetron (ZOFRAN) 4 MG tablet Take 1 tablet (4 mg total) by mouth every 8 (eight) hours as needed for nausea or vomiting. 08/07/17  Yes Wyatt Portela, MD  promethazine (PHENERGAN) 25 MG tablet Take 1 tablet (25 mg total) by mouth every 6 (six) hours as needed for nausea or vomiting. 08/07/17  Yes Wyatt Portela, MD  clopidogrel (PLAVIX) 75 MG tablet Take 1 tablet (75 mg total) by mouth daily. Patient not taking: Reported on 12/18/2017 12/01/17   Demetrios Loll, MD  megestrol (MEGACE) 40 MG/ML suspension TAKE 10 MLS (400 MG TOTAL) BY MOUTH 2 (TWO) TIMES DAILY. Patient not taking: No sig reported 11/21/17   Wyatt Portela, MD  polyethylene glycol (MIRALAX / GLYCOLAX) packet Take 17 g by mouth daily as needed for mild constipation. Patient not taking: Reported on 12/18/2017 12/01/17   Demetrios Loll, MD  QUEtiapine (SEROQUEL) 25 MG tablet Take 50 mg by mouth at bedtime.    [provider]     Family History  Problem Relation Age of Onset  . Heart attack Father   . Heart disease Father   . Heart attack Brother 13  . Heart disease Brother   . Heart attack Brother   . Heart disease Brother   . Cancer Sister  breast  . Hypertension Mother   . Diabetes Sister   . Cancer Maternal Aunt   . Cancer Maternal Aunt   . Asthma Son     Social History   Socioeconomic History  . Marital status: Married    Spouse name: Not on file  . Number of children: 3  . Years of education: Not on file  . Highest education level: Not on file  Occupational History  . Occupation: cancer registry reporting  Social Needs  . Financial resource strain: Not on file  . Food  insecurity:    Worry: Not on file    Inability: Not on file  . Transportation needs:    Medical: Not on file    Non-medical: Not on file  Tobacco Use  . Smoking status: Former Smoker    Packs/day: 1.00    Years: 10.00    Pack years: 10.00    Types: Cigarettes    Last attempt to quit: 03/26/1990    Years since quitting: 27.7  . Smokeless tobacco: Never Used  Substance and Sexual Activity  . Alcohol use: No    Frequency: Never  . Drug use: No  . Sexual activity: Never  Lifestyle  . Physical activity:    Days per week: Not on file    Minutes per session: Not on file  . Stress: Not on file  Relationships  . Social connections:    Talks on phone: Not on file    Gets together: Not on file    Attends religious service: Not on file    Active member of club or organization: Not on file    Attends meetings of clubs or organizations: Not on file    Relationship status: Not on file  Other Topics Concern  . Not on file  Social History Narrative   Has living will   Wife is health care POA.   Would accept resuscitation    No tube feeds if cognitively unaware.    ECOG Status: 2 - Symptomatic, <50% confined to bed  Review of Systems: A 12 point ROS discussed and pertinent positives are indicated in the HPI above.  All other systems are negative.  Review of Systems  Constitutional: Negative for activity change, appetite change, fatigue and fever.  Respiratory: Negative.   Cardiovascular: Negative.   Genitourinary: Negative for flank pain.       Lower abdominal/pelvic pain, worse when rising from a seated position    Vital Signs: BP 106/72   Pulse 83   Temp 97.8 F (36.6 C) (Oral)   Resp 20   Ht 6' (1.829 m)   Wt 79.4 kg   SpO2 100%   BMI 23.73 kg/m   Physical Exam  Constitutional: He appears well-developed and well-nourished.  Cardiovascular: Normal rate and regular rhythm.  Pulmonary/Chest: Effort normal and breath sounds normal.  Abdominal: Soft. Bowel sounds are  normal.  Genitourinary:  Genitourinary Comments: Normal-appearing urine seen within the left-sided nephrostomy bag  Skin: Skin is warm and dry.  Nursing note and vitals reviewed.   Imaging: Dg Chest 2 View  Result Date: 11/23/2017 CLINICAL DATA:  72 year old male with fever, confusion, possible volume overload. EXAM: CHEST - 2 VIEW COMPARISON:  Prior chest x-ray 06/23/2017 FINDINGS: Right IJ approach single-lumen power injectable port catheter. The catheter tip overlies the superior cavoatrial junction. Cardiac and mediastinal contours remain within normal limits. Atherosclerotic calcifications present in the transverse aorta. Slightly increased interstitial prominence diffusely with small Kerley B-lines in the periphery consistent  with minimal fluid overload. No overt pulmonary edema. Mild vascular congestion. No focal airspace opacity. Patchy airspace opacity in the posterior left lower lobe. IMPRESSION: 1. Patchy left lower lobe airspace opacity which may reflect bronchopneumonia in the appropriate clinical setting. 2. Slightly increased interstitial prominence consistent with very mild interstitial edema/volume overload. 3. Stable position of right IJ single-lumen power injectable port catheter with the catheter tip overlying the superior cavoatrial junction. 4.  Aortic Atherosclerosis (ICD10-170.0) Electronically Signed   By: Jacqulynn Cadet M.D.   On: 11/23/2017 10:22   Mr Brain Wo Contrast  Result Date: 11/28/2017 CLINICAL DATA:  Altered mental status.  History of prostate cancer. EXAM: MRI HEAD WITHOUT CONTRAST TECHNIQUE: Sagittal T1, axial coronal diffusion weighted imaging and axial T2 FLAIR sequences obtained on a 1.5 tesla scanner. Examination was truncated as patient was unable to tolerate further imaging. COMPARISON:  None. FINDINGS: Moderately motion degraded examination. INTRACRANIAL CONTENTS: Subcentimeter reduced diffusion RIGHT frontal white matter/centrum semiovale with low ADC  values. Patchy supratentorial white matter FLAIR T2 hyperintensities. The ventricles and sulci are normal for patient's age. No suspicious parenchymal signal, masses, mass effect. No abnormal extra-axial fluid collections. VASCULAR: Nondiagnostic assessment. SKULL AND UPPER CERVICAL SPINE: No abnormal sellar expansion. No suspicious calvarial bone marrow signal. Craniocervical junction maintained. SINUSES/ORBITS: Nondiagnostic assessment. OTHER: None. IMPRESSION: 1. 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 2. Mild-to-moderate chronic small vessel ischemic changes. Electronically Signed   By: Elon Alas M.D.   On: 11/28/2017 13:42   US Renal  Result Date: 11/27/2017 CLINICAL DATA:  Gross hematuria. Status post bilateral nephrostomy tubes. History of prostate carcinoma. EXAM: RENAL / URINARY TRACT ULTRASOUND COMPLETE COMPARISON:  CT, 11/23/2017 FINDINGS: Right Kidney: Length: 8.4 cm. Diffuse cortical thinning. No renal masses or convincing stones. No hydronephrosis. Left Kidney: Length: 10.5 cm. Suboptimal visualization. Normal overall parenchymal echogenicity. Lobulated contour. No convincing mass or stone. No visualized hydronephrosis. Bladder: Irregular posterior bladder mass measuring approximately 9 x 4 x 5 cm. Color Doppler blood flow is seen within the mass. IMPRESSION: 1. No sonographic evidence of hydronephrosis. The significant hydroureteronephrosis noted on the prior CT is not evident sonographically. It is not clear whether the nephrostomy tubes remain in place, with tubes not visualized sonographically. 2. Irregular bladder mass measuring 9 cm in greatest dimension. This is consistent with prostate carcinoma invading the bladder, as noted on the current CT. Electronically Signed   By: Lajean Manes M.D.   On: 11/27/2017 15:38   US Carotid Bilateral  Result Date: 11/29/2017 CLINICAL DATA:  Stroke.  Previous tobacco abuse. EXAM: BILATERAL  CAROTID DUPLEX ULTRASOUND TECHNIQUE: Pearline Cables scale imaging, color Doppler and duplex ultrasound were performed of bilateral carotid and vertebral arteries in the neck. COMPARISON:  None. FINDINGS: Criteria: Quantification of carotid stenosis is based on velocity parameters that correlate the residual internal carotid diameter with NASCET-based stenosis levels, using the diameter of the distal internal carotid lumen as the denominator for stenosis measurement. The following velocity measurements were obtained: RIGHT ICA: 117/41 cm/sec CCA: 762/26 cm/sec SYSTOLIC ICA/CCA RATIO:  3.33 ECA: 105 cm/sec LEFT ICA: 105/36 cm/sec CCA: 545/62 cm/sec SYSTOLIC ICA/CCA RATIO:  0.7 ECA: 225 cm/sec RIGHT CAROTID ARTERY: Mild plaque in the carotid bulb. No significant stenosis. Normal waveforms and color Doppler signal. RIGHT VERTEBRAL ARTERY:  Normal flow direction and waveform. LEFT CAROTID ARTERY: Mild partially calcified plaque in the bulb at the ICA origin. No high-grade stenosis. Normal waveforms and color Doppler signal. LEFT VERTEBRAL  ARTERY:  Normal flow direction and waveform. IMPRESSION: 1. Bilateral carotid bifurcation plaque resulting in less than 50% diameter stenosis. 2.  Antegrade bilateral vertebral arterial flow. Electronically Signed   By: Lucrezia Europe M.D.   On: 11/29/2017 11:02   Ir Fluoro Rm 30-60 Min  Result Date: 12/12/2017 INDICATION: Likely displaced right nephrostomy catheter with decreased drainage EXAM: ATTEMPTED MANIPULATION OF THE NEPHROSTOMY TRACT TO GAIN ACCESS TO THE KIDNEY COMPARISON:  None. MEDICATIONS: NONE ANESTHESIA/SEDATION: None CONTRAST:  5 mL Isovue-300 FLUOROSCOPY TIME:  Fluoroscopy Time: 8 minutes COMPLICATIONS: None immediate. PROCEDURE: Informed written consent was obtained from the patient after a thorough discussion of the procedural risks, benefits and alternatives. All questions were addressed. Maximal Sterile Barrier Technique was utilized including caps, mask, sterile gowns,  sterile gloves, sterile drape, hand hygiene and skin antiseptic. A timeout was performed prior to the initiation of the procedure. Subsequently the indwelling right nephrostomy catheter was evaluated and found to be almost completely displaced from the body. This was gently removed without difficulty. A 5 [place] Kumpe the catheter was then utilized to inject and manipulate the nephrostomy tract although no passage of contrast into the collecting system was seen. Limited ultrasound evaluation was then performed which demonstrates some mild hydronephrosis. The patient will need a repeat nephrostomy placement on the right although was not NPO for that type of procedure with the potential for conscious sedation. The patient will be rescheduled on a convenient date to allow for appropriate replacement of the right percutaneous nephrostomy catheter. IMPRESSION: Displaced right nephrostomy catheter. The tract was unable to be cannulated. Mild hydronephrosis is noted. The patient is scheduled for repeat right nephrostomyon 12-20-17. Electronically Signed   By: Inez Catalina M.D.   On: 12/12/2017 15:31   Ct Renal Stone Study  Result Date: 11/23/2017 CLINICAL DATA:  Chronic, yet, increasing pelvic and right flank pain due to prostate cancer and bilateral hydronephrosis. 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 Some confusion also noted today. EXAM: CT ABDOMEN AND PELVIS WITHOUT CONTRAST TECHNIQUE: Multidetector CT imaging of the abdomen and pelvis was performed following the standard protocol without IV contrast. COMPARISON:  11/01/2017 FINDINGS: Lower chest: The heart is normal in size. Trace pleural effusions. Lung base interstitial thickening. Mild dependent subsegmental atelectasis. Hepatobiliary: Liver normal in size. Multiple small low-density liver lesions are noted consistent with cysts, stable from the prior CT. No new liver abnormalities. Small dependent  gallstones. No evidence of acute cholecystitis. No bile duct dilation. Pancreas: Unremarkable. No pancreatic ductal dilatation or surrounding inflammatory changes. Spleen: Normal in size without focal abnormality. Adrenals/Urinary Tract: No adrenal masses. Marked right and moderate left hydronephrosis. There is significant right renal atrophy. No renal masses. No intrarenal stones. Both ureters are dilated, moderate to marked on the right and moderate on the left. These findings are due to an irregular bladder mass, which is contiguous with the prostate. Mass involves the posterior bladder wall, right greater than left. No ureteral stones. There is stranding fat adjacent to the bladder, particularly posteriorly between the prostate, bladder and seminal vesicles. Right seminal vesicle is mildly enlarged and nodular. These changes similar to the prior CT. Stomach/Bowel: Stomach is within normal limits. Appendix appears normal. No evidence of bowel wall thickening, distention, or inflammatory changes. Vascular/Lymphatic: There are several prominent pelvic lymph nodes, largest to the left of the left common iliac artery measuring 6 mm in short axis. These are stable from the prior CT. Aortic atherosclerotic calcifications.  No  aneurysm. Reproductive: Mildly enlarged heterogeneous prostate extends into the bladder, without significant change from the prior CT. Other: Subcutaneous edema as increased when compared the prior CT, noted along the pelvis and upper thighs, right greater than left. No ascites. Small left inguinal hernia contains fat and fluid. 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 There are compression fractures of T9, T11 and L1. The skeletal appearance is stable from the prior CT. IMPRESSION: 1. Marked right and moderate left hydronephrosis. Bilateral hydro ureters, right greater than left. 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 Mildly prominent, but not pathologically enlarged, pelvic lymph nodes, suspicious for metastatic disease. The appearance is stable from the recent prior CT. 2. Trace pleural effusions with lung base interstitial thickening suggests mild interstitial edema. There is also subcutaneous edema in the lower abdomen and upper thighs, greater on the right. These findings are new since the prior CT. 3. Bony metastatic disease and multiple compression fractures are stable from the recent CT. 4. No acute findings in the abdomen or pelvis. 5. Aortic atherosclerosis. 6. Gallstones. Electronically Signed   By: Lajean Manes M.D.   On: 11/23/2017 11:30   Nm Xofigo Injection  Result Date: 11/20/2017  Trudi Ida was injected intravenously in Nuclear Medicine under the supervision of the attending radiologist   Ir Nephrostogram Left Initial Placement  Result Date: 11/23/2017 INDICATION: Acute renal failure, invasive prostate cancer with bilateral obstructive hydronephrosis EXAM: ULTRASOUND FLUOROSCOPIC BILATERAL 10 FRENCH NEPHROSTOMY COMPARISON:  11/23/2017 MEDICATIONS: PATIENT IS ALREADY RECEIVING IV ANTIBIOTICS AS AN INPATIENT ANESTHESIA/SEDATION: Fentanyl 50 mcg IV; Versed 4.0 mg IV Moderate Sedation Time:  20 minutes The patient was continuously monitored during the procedure by the interventional radiology nurse under my direct supervision. CONTRAST:  20 cc-administered into the collecting system(s) FLUOROSCOPY TIME:  Fluoroscopy Time: 1 minutes 48 seconds (15 mGy). COMPLICATIONS: None immediate. PROCEDURE: Informed written consent was obtained from the patient after a thorough discussion of the procedural risks, benefits and alternatives. All questions were addressed. Maximal Sterile Barrier Technique was utilized including caps, mask, sterile gowns, sterile gloves, sterile drape, hand hygiene and skin antiseptic. A timeout was performed prior to the initiation of  the procedure. Previous imaging reviewed.  Patient positioned prone. Left nephrostomy placement: Under sterile conditions and local anesthesia, ultrasound percutaneous needle access performed of a dilated midpole calyx with an 18 gauge 15 cm needle. There was return of urine. Amplatz guidewire inserted followed by tract dilatation to insert a 10 [place] drain. Retention loop formed in the renal pelvis. Contrast injection confirms position. Images obtained for documentation. Gravity drainage bag connected. Catheter secured with Prolene suture. No immediate complication. Right nephrostomy placement: Also under sterile conditions and local anesthesia, ultrasound percutaneous needle access performed of a dilated right midpole calyx with an 18 gauge needle. There was return of urine. Amplatz guidewire inserted followed by tract dilatation to insert a 10 [place] drain. Retention loop formed the renal pelvis. Contrast injection confirms position. Catheter secured with Prolene suture and connected to gravity drainage bag. Sterile dressing applied. No immediate complication. Patient tolerated the procedure well. IMPRESSION: Successful ultrasound and fluoroscopic bilateral 10 French nephrostomies Electronically Signed   By: Jerilynn Mages.  Shick M.D.   On: 11/23/2017 15:07    Labs:  CBC: Recent Labs    11/23/17 0928 11/25/17 0400  11/29/17 0016 11/29/17 1000 12/02/17 1950 12/18/17 1413  WBC 5.3 5.8  --   --   --  4.3 4.6  HGB 8.7* 7.9*   < > 8.4* 8.2* 7.6* 9.6*  HCT 25.5* 23.2*   < > 23.8* 24.0* 22.4* 27.2*  PLT 236 218  --   --   --  263 358   < > = values in this interval not displayed.    COAGS: Recent Labs    01/09/17 0927 11/23/17 1138 12/18/17 1413  INR 0.90 1.13 1.04  APTT 25 26 28     BMP: Recent Labs    11/29/17 0518 11/30/17 0732 12/01/17 0642 12/02/17 1950  NA 140 143 143 140  K 3.7 3.8 3.9 4.2  CL 112* 116* 116* 113*  CO2 21* 20* 21* 19*  GLUCOSE 106* 103* 111* 104*  BUN 26* 28* 30* 40*   CALCIUM 8.9 8.7* 8.5* 9.0  CREATININE 1.83* 1.88* 1.57* 1.74*  GFRNONAA 36* 35* 43* 38*  GFRAA 41* 40* 50* 44*    LIVER FUNCTION TESTS: Recent Labs    09/16/17 1216 10/31/17 0937 11/19/17 1228 12/02/17 1950  BILITOT 0.3 0.3 0.3 0.5  AST 12 8* 11* 16  ALT 6 7 6 16   ALKPHOS 66 71 75 50  PROT 7.0 6.7 7.0 6.8  ALBUMIN 4.2 3.7 3.8 3.5    TUMOR MARKERS: No results for input(s): AFPTM, CEA, CA199, CHROMGRNA in the last 8760 hours.  Assessment and Plan:  [name] is a 72 y.o. male with past no history significant for prostate cancer who subsequently developed bilateral obstructive uropathy requiring placement of bilateral percutaneous nephrostomy catheters on 11/23/2017 by my IR partner, Dr. Annamaria Boots.  Unfortunately, the right sided nephrostomy catheter was inadvertently removed and attempted fluoroscopic guided recanalization on 12/12/2017 was unsuccessful.    As such, the patient presents today for repeat attempted right-sided percutaneous nephrostomy catheter placement.    Risks and benefits of right sided PCN placement was discussed with the patient and the patient's wife including, but not limited to, infection, bleeding, significant bleeding causing loss or decrease in renal function or damage to adjacent structures.   All of the patient's questions were answered, patient is agreeable to proceed.  Consent signed and in chart.  Thank you for this interesting consult.  I greatly enjoyed meeting [name] and look forward to participating in their care.  A copy of this report was sent to the requesting provider on this date.  Electronically Signed: Sandi Mariscal, MD 12/18/2017, 3:07 PM   I spent a total of 15 Minutes in face to face in clinical consultation, greater than 50% of which was counseling/coordinating care for right-sided nephrostomy catheter placement

## 2017-12-18 NOTE — Telephone Encounter (Signed)
Dr Silvio Pate, do you want to approve 1 year?

## 2017-12-19 ENCOUNTER — Ambulatory Visit: Payer: Medicare Other

## 2017-12-19 ENCOUNTER — Ambulatory Visit: Payer: Medicare Other | Admitting: Radiation Oncology

## 2017-12-19 ENCOUNTER — Telehealth: Payer: Self-pay | Admitting: Radiation Oncology

## 2017-12-19 DIAGNOSIS — N138 Other obstructive and reflux uropathy: Secondary | ICD-10-CM | POA: Diagnosis not present

## 2017-12-19 DIAGNOSIS — F0390 Unspecified dementia without behavioral disturbance: Secondary | ICD-10-CM | POA: Diagnosis not present

## 2017-12-19 DIAGNOSIS — N189 Chronic kidney disease, unspecified: Secondary | ICD-10-CM | POA: Diagnosis not present

## 2017-12-19 DIAGNOSIS — D631 Anemia in chronic kidney disease: Secondary | ICD-10-CM | POA: Diagnosis not present

## 2017-12-19 DIAGNOSIS — C61 Malignant neoplasm of prostate: Secondary | ICD-10-CM | POA: Diagnosis not present

## 2017-12-19 DIAGNOSIS — Z436 Encounter for attention to other artificial openings of urinary tract: Secondary | ICD-10-CM | POA: Diagnosis not present

## 2017-12-19 NOTE — Progress Notes (Signed)
Received voicemail from patient's wife, Quita Skye, requesting a return call. Phoned her back. She request that her husband resume his radiation on October 7th. I explained our staff would be in contact with her reference appointment time for 10/7. She verbalized understanding.

## 2017-12-20 ENCOUNTER — Ambulatory Visit: Payer: Medicare Other | Admitting: Radiation Oncology

## 2017-12-20 ENCOUNTER — Ambulatory Visit: Payer: Medicare Other

## 2017-12-20 DIAGNOSIS — F0390 Unspecified dementia without behavioral disturbance: Secondary | ICD-10-CM | POA: Diagnosis not present

## 2017-12-20 DIAGNOSIS — C61 Malignant neoplasm of prostate: Secondary | ICD-10-CM | POA: Diagnosis not present

## 2017-12-20 DIAGNOSIS — Z436 Encounter for attention to other artificial openings of urinary tract: Secondary | ICD-10-CM | POA: Diagnosis not present

## 2017-12-20 DIAGNOSIS — D631 Anemia in chronic kidney disease: Secondary | ICD-10-CM | POA: Diagnosis not present

## 2017-12-20 DIAGNOSIS — N138 Other obstructive and reflux uropathy: Secondary | ICD-10-CM | POA: Diagnosis not present

## 2017-12-20 DIAGNOSIS — N189 Chronic kidney disease, unspecified: Secondary | ICD-10-CM | POA: Diagnosis not present

## 2017-12-23 ENCOUNTER — Ambulatory Visit: Payer: Medicare Other

## 2017-12-23 ENCOUNTER — Other Ambulatory Visit: Payer: Self-pay

## 2017-12-23 NOTE — Patient Outreach (Signed)
[facility]) Care Management  12/23/2017  Mackinley Kiehn Geurin 03/28/1945 [id number]   Referral Date: 12/20/17 Referral Source:  [facility] Referral Reason: High risk for readmit   Outreach Attempt: spoke with wife Quita Skye.  Discussed with her reason for referral.  She acknowledges that patient is part of the [facility] program and that concern for re-hospitalization.  Patient lives in the home with wife, daughter and grandson.  Wife is the primary care giver and she states that patient needs assist with aspects of care.  Patient noted to have metastatic prostate cancer with nephrostomy tube.  Patient also had some renal failure due to this and wife mentions a history of heart failure.  Wife reports that patient is currently in treatment for cancer and sees the oncologist on Wednesday.  Patient has had 2 ED/admits in the last 30 days.  Wife states that patient has PT, Aide and Nurse from [facility] but she says that the nurse visits are decreasing to she thinks once a week.   Discussed [facility] services and wife is agreeable to nurse due to recent ED/hospitalizations and need for continued education and support once [facility] stops.     Plan: RN CM will refer to community nurse due to high risk re-admit, any care coordination and needed education and support.    Jone Baseman, RN, MSN [facility] Care Management Care Management Coordinator Direct Line [phone] Toll Free: [phone]  Fax: [phone]

## 2017-12-24 ENCOUNTER — Ambulatory Visit: Payer: Medicare Other

## 2017-12-24 ENCOUNTER — Inpatient Hospital Stay: Payer: Medicare Other | Attending: Oncology

## 2017-12-24 ENCOUNTER — Inpatient Hospital Stay: Payer: Medicare Other

## 2017-12-24 DIAGNOSIS — R911 Solitary pulmonary nodule: Secondary | ICD-10-CM | POA: Diagnosis not present

## 2017-12-24 DIAGNOSIS — C7951 Secondary malignant neoplasm of bone: Secondary | ICD-10-CM | POA: Diagnosis not present

## 2017-12-24 DIAGNOSIS — Z936 Other artificial openings of urinary tract status: Secondary | ICD-10-CM | POA: Diagnosis not present

## 2017-12-24 DIAGNOSIS — C61 Malignant neoplasm of prostate: Secondary | ICD-10-CM | POA: Diagnosis not present

## 2017-12-24 DIAGNOSIS — I700 Atherosclerosis of aorta: Secondary | ICD-10-CM | POA: Insufficient documentation

## 2017-12-24 DIAGNOSIS — R1900 Intra-abdominal and pelvic swelling, mass and lump, unspecified site: Secondary | ICD-10-CM | POA: Diagnosis not present

## 2017-12-24 DIAGNOSIS — N138 Other obstructive and reflux uropathy: Secondary | ICD-10-CM | POA: Diagnosis not present

## 2017-12-24 DIAGNOSIS — F0390 Unspecified dementia without behavioral disturbance: Secondary | ICD-10-CM | POA: Diagnosis not present

## 2017-12-24 DIAGNOSIS — D631 Anemia in chronic kidney disease: Secondary | ICD-10-CM | POA: Diagnosis not present

## 2017-12-24 DIAGNOSIS — Z79818 Long term (current) use of other agents affecting estrogen receptors and estrogen levels: Secondary | ICD-10-CM | POA: Diagnosis not present

## 2017-12-24 DIAGNOSIS — Z79899 Other long term (current) drug therapy: Secondary | ICD-10-CM | POA: Insufficient documentation

## 2017-12-24 DIAGNOSIS — Z9221 Personal history of antineoplastic chemotherapy: Secondary | ICD-10-CM | POA: Insufficient documentation

## 2017-12-24 DIAGNOSIS — Z8673 Personal history of transient ischemic attack (TIA), and cerebral infarction without residual deficits: Secondary | ICD-10-CM | POA: Diagnosis not present

## 2017-12-24 DIAGNOSIS — N189 Chronic kidney disease, unspecified: Secondary | ICD-10-CM | POA: Diagnosis not present

## 2017-12-24 DIAGNOSIS — N1330 Unspecified hydronephrosis: Secondary | ICD-10-CM | POA: Diagnosis not present

## 2017-12-24 DIAGNOSIS — R630 Anorexia: Secondary | ICD-10-CM | POA: Diagnosis not present

## 2017-12-24 DIAGNOSIS — Z95828 Presence of other vascular implants and grafts: Secondary | ICD-10-CM

## 2017-12-24 DIAGNOSIS — Z436 Encounter for attention to other artificial openings of urinary tract: Secondary | ICD-10-CM | POA: Diagnosis not present

## 2017-12-24 LAB — CMP (CANCER CENTER ONLY)
ALBUMIN: 3.6 g/dL (ref 3.5–5.0)
ALK PHOS: 113 U/L (ref 38–126)
ALT: 13 U/L (ref 0–44)
ANION GAP: 8 (ref 5–15)
AST: 12 U/L — ABNORMAL LOW (ref 15–41)
BILIRUBIN TOTAL: 0.3 mg/dL (ref 0.3–1.2)
BUN: 25 mg/dL — ABNORMAL HIGH (ref 8–23)
CO2: 19 mmol/L — ABNORMAL LOW (ref 22–32)
CREATININE: 1.34 mg/dL — AB (ref 0.61–1.24)
Calcium: 9.5 mg/dL (ref 8.9–10.3)
Chloride: 111 mmol/L (ref 98–111)
GFR, Est AFR Am: >60 mL/min (ref >60)
GFR, Estimated: 52 mL/min — ABNORMAL LOW (ref >60)
GLUCOSE: 97 mg/dL (ref 70–99)
Potassium: 4.2 mmol/L (ref 3.5–5.1)
Sodium: 138 mmol/L (ref 135–145)
TOTAL PROTEIN: 6.9 g/dL (ref 6.5–8.1)

## 2017-12-24 LAB — CBC WITH DIFFERENTIAL (CANCER CENTER ONLY)
BASOS ABS: 0 10*3/uL (ref 0.0–0.1)
Basophils Relative: 1 %
EOS PCT: 2 %
Eosinophils Absolute: 0.1 10*3/uL (ref 0.0–0.5)
HCT: 26.5 % — ABNORMAL LOW (ref 38.4–49.9)
Hemoglobin: 8.9 g/dL — ABNORMAL LOW (ref 13.0–17.1)
Lymphocytes Relative: 21 %
Lymphs Abs: 0.8 10*3/uL — ABNORMAL LOW (ref 0.9–3.3)
MCH: 29.8 pg (ref 27.2–33.4)
MCHC: 33.7 g/dL (ref 32.0–36.0)
MCV: 88.4 fL (ref 79.3–98.0)
MONO ABS: 0.4 10*3/uL (ref 0.1–0.9)
MONOS PCT: 10 %
Neutro Abs: 2.5 10*3/uL (ref 1.5–6.5)
Neutrophils Relative %: 66 %
PLATELETS: 281 10*3/uL (ref 140–400)
RBC: 3 MIL/uL — ABNORMAL LOW (ref 4.20–5.82)
RDW: 15.5 % — AB (ref 11.0–14.6)
WBC Count: 3.8 10*3/uL — ABNORMAL LOW (ref 4.0–10.3)

## 2017-12-24 MED ORDER — HEPARIN SOD (PORK) LOCK FLUSH 100 UNIT/ML IV SOLN
500.0000 [IU] | Freq: Once | INTRAVENOUS | Status: AC
  Administered 2017-12-24: 500 [IU]
  Filled 2017-12-24: qty 5

## 2017-12-24 MED ORDER — SODIUM CHLORIDE 0.9% FLUSH
10.0000 mL | Freq: Once | INTRAVENOUS | Status: AC
  Administered 2017-12-24: 10 mL
  Filled 2017-12-24: qty 10

## 2017-12-24 NOTE — Patient Instructions (Signed)
Implanted Port Home Guide An implanted port is a type of central line that is placed under the skin. Central lines are used to provide IV access when treatment or nutrition needs to be given through a person's veins. Implanted ports are used for long-term IV access. An implanted port may be placed because:  You need IV medicine that would be irritating to the small veins in your hands or arms.  You need long-term IV medicines, such as antibiotics.  You need IV nutrition for a long period.  You need frequent blood draws for lab tests.  You need dialysis.  Implanted ports are usually placed in the chest area, but they can also be placed in the upper arm, the abdomen, or the leg. An implanted port has two main parts:  Reservoir. The reservoir is round and will appear as a small, raised area under your skin. The reservoir is the part where a needle is inserted to give medicines or draw blood.  Catheter. The catheter is a thin, flexible tube that extends from the reservoir. The catheter is placed into a large vein. Medicine that is inserted into the reservoir goes into the catheter and then into the vein.  How will I care for my incision site? Do not get the incision site wet. Bathe or shower as directed by your health care provider. How is my port accessed? Special steps must be taken to access the port:  Before the port is accessed, a numbing cream can be placed on the skin. This helps numb the skin over the port site.  Your health care provider uses a sterile technique to access the port. ? Your health care provider must put on a mask and sterile gloves. ? The skin over your port is cleaned carefully with an antiseptic and allowed to dry. ? The port is gently pinched between sterile gloves, and a needle is inserted into the port.  Only "non-coring" port needles should be used to access the port. Once the port is accessed, a blood return should be checked. This helps ensure that the port  is in the vein and is not clogged.  If your port needs to remain accessed for a constant infusion, a clear (transparent) bandage will be placed over the needle site. The bandage and needle will need to be changed every week, or as directed by your health care provider.  Keep the bandage covering the needle clean and dry. Do not get it wet. Follow your health care provider's instructions on how to take a shower or bath while the port is accessed.  If your port does not need to stay accessed, no bandage is needed over the port.  What is flushing? Flushing helps keep the port from getting clogged. Follow your health care provider's instructions on how and when to flush the port. Ports are usually flushed with saline solution or a medicine called heparin. The need for flushing will depend on how the port is used.  If the port is used for intermittent medicines or blood draws, the port will need to be flushed: ? After medicines have been given. ? After blood has been drawn. ? As part of routine maintenance.  If a constant infusion is running, the port may not need to be flushed.  How long will my port stay implanted? The port can stay in for as long as your health care provider thinks it is needed. When it is time for the port to come out, surgery will be   done to remove it. The procedure is similar to the one performed when the port was put in. When should I seek immediate medical care? When you have an implanted port, you should seek immediate medical care if:  You notice a bad smell coming from the incision site.  You have swelling, redness, or drainage at the incision site.  You have more swelling or pain at the port site or the surrounding area.  You have a fever that is not controlled with medicine.  This information is not intended to replace advice given to you by your health care provider. Make sure you discuss any questions you have with your health care provider. Document  Released: 03/12/2005 Document Revised: 08/18/2015 Document Reviewed: 11/17/2012 Elsevier Interactive Patient Education  2017 Elsevier Inc.  

## 2017-12-25 ENCOUNTER — Ambulatory Visit: Payer: Medicare Other

## 2017-12-25 ENCOUNTER — Inpatient Hospital Stay (HOSPITAL_BASED_OUTPATIENT_CLINIC_OR_DEPARTMENT_OTHER): Payer: Medicare Other | Admitting: Oncology

## 2017-12-25 ENCOUNTER — Telehealth: Payer: Self-pay | Admitting: Oncology

## 2017-12-25 VITALS — BP 130/81 | HR 84 | Temp 97.5°F | Resp 18 | Ht 72.0 in | Wt 163.9 lb

## 2017-12-25 DIAGNOSIS — R1900 Intra-abdominal and pelvic swelling, mass and lump, unspecified site: Secondary | ICD-10-CM | POA: Diagnosis not present

## 2017-12-25 DIAGNOSIS — N1330 Unspecified hydronephrosis: Secondary | ICD-10-CM | POA: Diagnosis not present

## 2017-12-25 DIAGNOSIS — Z8673 Personal history of transient ischemic attack (TIA), and cerebral infarction without residual deficits: Secondary | ICD-10-CM | POA: Diagnosis not present

## 2017-12-25 DIAGNOSIS — Z7982 Long term (current) use of aspirin: Secondary | ICD-10-CM

## 2017-12-25 DIAGNOSIS — N138 Other obstructive and reflux uropathy: Secondary | ICD-10-CM | POA: Diagnosis not present

## 2017-12-25 DIAGNOSIS — I700 Atherosclerosis of aorta: Secondary | ICD-10-CM

## 2017-12-25 DIAGNOSIS — Z79899 Other long term (current) drug therapy: Secondary | ICD-10-CM

## 2017-12-25 DIAGNOSIS — E43 Unspecified severe protein-calorie malnutrition: Secondary | ICD-10-CM

## 2017-12-25 DIAGNOSIS — C7951 Secondary malignant neoplasm of bone: Secondary | ICD-10-CM

## 2017-12-25 DIAGNOSIS — G9341 Metabolic encephalopathy: Secondary | ICD-10-CM

## 2017-12-25 DIAGNOSIS — F0390 Unspecified dementia without behavioral disturbance: Secondary | ICD-10-CM

## 2017-12-25 DIAGNOSIS — Z9221 Personal history of antineoplastic chemotherapy: Secondary | ICD-10-CM | POA: Diagnosis not present

## 2017-12-25 DIAGNOSIS — D631 Anemia in chronic kidney disease: Secondary | ICD-10-CM | POA: Diagnosis not present

## 2017-12-25 DIAGNOSIS — Z79818 Long term (current) use of other agents affecting estrogen receptors and estrogen levels: Secondary | ICD-10-CM | POA: Diagnosis not present

## 2017-12-25 DIAGNOSIS — N189 Chronic kidney disease, unspecified: Secondary | ICD-10-CM | POA: Diagnosis not present

## 2017-12-25 DIAGNOSIS — C61 Malignant neoplasm of prostate: Secondary | ICD-10-CM | POA: Diagnosis not present

## 2017-12-25 DIAGNOSIS — Z436 Encounter for attention to other artificial openings of urinary tract: Secondary | ICD-10-CM | POA: Diagnosis not present

## 2017-12-25 DIAGNOSIS — Z87891 Personal history of nicotine dependence: Secondary | ICD-10-CM

## 2017-12-25 LAB — PROSTATE-SPECIFIC AG, SERUM (LABCORP): PROSTATE SPECIFIC AG, SERUM: 280 ng/mL — AB (ref 0.0–4.0)

## 2017-12-25 NOTE — Progress Notes (Signed)
Hematology and Oncology Follow Up Visit  [name] [id number] 20-Aug-1945 72 y.o. 12/25/2017 12:41 PM   Principle Diagnosis: 72 year old man with prostate cancer diagnosed in 2015.  His Gleason score was 4+5 = 9 and a PSA 116.  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  116.    Prior Therapy:  Androgen deprivation therapy in addition to systemic chemotherapy in the form of Taxotere 75 mg/m every 3 weeks with plans for total of 6 cycles. Cycle 1 given on 05/05/2014. He is status post 6 cycles of therapy concluded on 08/20/2014.  He developed castration resistant disease in 2017.  Xtandi 160 mg daily started on 07/25/2015. Therapy discontinued in May 2018 because of progression of disease.  Jevtana chemotherapy started on 08/08/2016. He received 25 mg/m cycle 1 and then 20 mg/m in subsequent cycles.  Last cycle of chemotherapy given on 03/13/2017 for a total of 11 cycles.   Xofigo monthly injections started on June 17, 2017.  He is is status post 6 treatments concluded in August 2019.  He is status post nephrostomy tube placement bilaterally because of bilateral hydronephrosis.  This was completed in September 2019.   Current therapy:  Androgen deprivation in the form of Lupron received every 4 months at 30 mg.  Next injection is scheduled for October 2019.     Interim History:  [name] returns today for a follow-up visit.  Since last visit, he was hospitalized between end of August of 2019 to her presenting with altered mental status and found to have acute renal failure related to his bilateral hydronephrosis.  He underwent bilateral nephrostomy tube placement during that hospitalization.  He has subsequently seen in the emergency department on 12/02/2017 for altered mental status and agitation.  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  He did have a rather slow and lengthy recovery.  But he is improving at this  time.  His mental status has improved and no longer having any episodic confusion or lethargy.  He is ambulating without any difficulties and has not had any falls or syncope.  He does have some back pain and discomfort but is manageable overall.  He denies any fevers or chills.  He is eating better although he does report some occasional nausea.  He does not report any headaches, blurry vision or syncope or seizures.  He does not report any night sweats.  He does not report any chest pain, palpitation, orthopnea or leg edema. He does not report any cough, hemoptysis or hematemesis. He does not report any hematochezia, melena.  He denies any constipation or diarrhea.  He does not report any skin rashes or lesions.  He does not report any lymphadenopathy or petechiae. He denies any pathological fractures or bone pain.  Remainder of his review of systems is negative.  Medications: I have reviewed the patient's current medications.   Current Outpatient Medications  Medication Sig Dispense Refill  . atorvastatin (LIPITOR) 40 MG tablet Take 1 tablet (40 mg total) by mouth daily at 6 PM. 90 tablet 3  . clopidogrel (PLAVIX) 75 MG tablet Take 1 tablet (75 mg total) by mouth daily. (Patient not taking: Reported on 12/18/2017)    . lidocaine-prilocaine (EMLA) cream Apply 1 application topically as needed. Apply to portacath on the day of chemotherapy. 30 g 3  . loratadine (CLARITIN) 10 MG tablet Take 10 mg by mouth daily as needed for allergies.    Marland Kitchen  megestrol (MEGACE) 40 MG/ML suspension TAKE 10 MLS (400 MG TOTAL) BY MOUTH 2 (TWO) TIMES DAILY. (Patient not taking: No sig reported) 240 mL 0  . ondansetron (ZOFRAN) 4 MG tablet Take 1 tablet (4 mg total) by mouth every 8 (eight) hours as needed for nausea or vomiting. 30 tablet 2  . polyethylene glycol (MIRALAX / GLYCOLAX) packet Take 17 g by mouth daily as needed for mild constipation. (Patient not taking: Reported on 12/18/2017) 14 each 0  . promethazine  (PHENERGAN) 25 MG tablet Take 1 tablet (25 mg total) by mouth every 6 (six) hours as needed for nausea or vomiting. 60 tablet 3  . QUEtiapine (SEROQUEL) 25 MG tablet Take 50 mg by mouth at bedtime.     No current facility-administered medications for this visit.    Facility-Administered Medications Ordered in Other Visits  Medication Dose Route Frequency Provider Last Rate Last Dose  . denosumab (XGEVA) injection 120 mg  120 mg Subcutaneous Once Wyatt Portela, MD         Allergies:  Allergies  Allergen Reactions  . Versed [Midazolam] Other (See Comments)    agitation    Past Medical History, Surgical history, Social history: Reviewed and remain unchanged.   Physical Exam:  Blood pressure 130/81, pulse 84, temperature (!) 97.5 F (36.4 C), temperature source Oral, resp. rate 18, height 6' (1.829 m), weight 163 lb 14.4 oz (74.3 kg), SpO2 100 %.  ECOG: 1   General appearance: Alert, awake without any distress. Head: Atraumatic without abnormalities Oropharynx: Without any thrush or ulcers. Eyes: No scleral icterus. Lymph nodes: No lymphadenopathy noted in the cervical, supraclavicular, or axillary nodes Heart:regular rate and rhythm, without any murmurs or gallops.   Lung: Clear to auscultation without any rhonchi, wheezes or dullness to percussion. Abdomin: Soft, nontender without any shifting dullness or ascites. Musculoskeletal: No clubbing or cyanosis. Neurological: No motor or sensory deficits. Skin: No rashes or lesions.       Lab Results:  CBC    Component Value Date/Time   WBC 3.8 (L) 12/24/2017 1017   WBC 4.6 12/18/2017 1413   RBC 3.00 (L) 12/24/2017 1017   HGB 8.9 (L) 12/24/2017 1017   HGB 12.2 (L) 03/13/2017 0904   HCT 26.5 (L) 12/24/2017 1017   HCT 37.8 (L) 03/13/2017 0904   PLT 281 12/24/2017 1017   PLT 207 03/13/2017 0904   MCV 88.4 12/24/2017 1017   MCV 92.0 03/13/2017 0904   MCH 29.8 12/24/2017 1017   MCHC 33.7 12/24/2017 1017   RDW 15.5  (H) 12/24/2017 1017   RDW 15.4 (H) 03/13/2017 0904   LYMPHSABS 0.8 (L) 12/24/2017 1017   LYMPHSABS 1.2 03/13/2017 0904   MONOABS 0.4 12/24/2017 1017   MONOABS 0.4 03/13/2017 0904   EOSABS 0.1 12/24/2017 1017   EOSABS 0.0 03/13/2017 0904   BASOSABS 0.0 12/24/2017 1017   BASOSABS 0.0 03/13/2017 0904   '    Chemistry      Component Value Date/Time   NA 138 12/24/2017 1017   NA 142 03/13/2017 0904   K 4.2 12/24/2017 1017   K 4.4 03/13/2017 0904   CL 111 12/24/2017 1017   CO2 19 (L) 12/24/2017 1017   CO2 22 03/13/2017 0904   BUN 25 (H) 12/24/2017 1017   BUN 14.9 03/13/2017 0904   CREATININE 1.34 (H) 12/24/2017 1017   CREATININE 1.0 03/13/2017 0904      Component Value Date/Time   CALCIUM 9.5 12/24/2017 1017   CALCIUM 9.1 03/13/2017 0904  ALKPHOS 113 12/24/2017 1017   ALKPHOS 67 03/13/2017 0904   AST 12 (L) 12/24/2017 1017   AST 9 03/13/2017 0904   ALT 13 12/24/2017 1017   ALT 7 03/13/2017 0904   BILITOT 0.3 12/24/2017 1017   BILITOT 0.32 03/13/2017 0904       Results for Tisdel, Keith E "GENE" (MRN [id number]) as of 12/25/2017 12:42  Ref. Range 09/16/2017 12:16 11/19/2017 12:28 12/24/2017 10:17  Prostate Specific Ag, Serum Latest Ref Range: 0.0 - 4.0 ng/mL 88.5 (H) 217.0 (H) 280.0 (H)     IMPRESSION: 1. Interval progression of disease. 2. Marked increase in size of mass arising from the prostate gland which involves a significant portion of the urinary bladder and distal right ureter. There is also obstruction of the left UVJ. 3. New small left common iliac and left pelvic sidewall lymph nodes are suspicious for metastatic adenopathy. 4. Progression of sclerotic bone metastasis involving the lumbar spine and pelvis. 5. 4 mm left lower lobe lung nodule is new from 04/10/2016. 6. Bilateral hydronephrosis as above. Right hydronephrosis, is likely chronic as there has been progressive cortical volume loss from 04/10/2016. There is a focal area of decreased  cortical enhancement of the left kidney which may represent a vascular insult or focal pyelonephritis. 7. New age-indeterminate T11 and L1 compression deformities. 8.  Aortic Atherosclerosis (ICD10-I70.0).      Impression and Plan:  72 year old man with  1.  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  He is status post multiple therapies outlined above and has progressed on all of them.  His PSA is continues to rise currently at 280 although he is minimally symptomatic from his disease.  His last CT scan obtained on November 23, 2017 showed an enlarging pelvic mass arising from the prostate and causing his hydronephrosis.  He is scheduled to have radiation therapy in the near future under the care of Dr. Tammi Klippel.  The natural course of his disease was reviewed today as well as future treatment options.  He has been exposed to most effective anticancer therapy and the likelihood of success of any retreatment of any other agents is considered less likely.  The plan is to repeat his PSA after completing radiation therapy and assess his clinical status and consider treatment at that time.  Other option would be retreatment with Taxotere chemotherapy versus clinical trial versus hospice care.    2. Anorexia: His appetite is improving after his recent illness.  He has been given Megace in the past.  3. Androgen depravation: Lupron will be resumed in the near future.  He will have that repeated every 4 months.  4.  Bilateral hydronephrosis: he is status post bilateral nephrostomy tube placement with improvement in his kidney function.  The etiology is related to recurrent prostate cancer locally.  Radiation therapy should help with disease control.   5. IV access: Port-A-Cath will be flushed periodically.   6. Follow-up: Will be in November after completing radiation therapy to follow his progress.   25  minutes was spent with the patient  face-to-face today.  More than 50% of time was dedicated to reviewing the natural course of his disease, reviewing imaging studies and coordinating plan of care.  Marchia Diguglielmo,MD 10/2/201912:41 PM

## 2017-12-25 NOTE — Addendum Note (Signed)
Addended by: Wyatt Portela on: 12/25/2017 12:54 PM   Modules accepted: Level of Service

## 2017-12-25 NOTE — Telephone Encounter (Signed)
Appts scheduled avs/calendar printed per 10/2 los °

## 2017-12-26 ENCOUNTER — Ambulatory Visit: Payer: Medicare Other

## 2017-12-26 DIAGNOSIS — D631 Anemia in chronic kidney disease: Secondary | ICD-10-CM | POA: Diagnosis not present

## 2017-12-26 DIAGNOSIS — Z436 Encounter for attention to other artificial openings of urinary tract: Secondary | ICD-10-CM | POA: Diagnosis not present

## 2017-12-26 DIAGNOSIS — N189 Chronic kidney disease, unspecified: Secondary | ICD-10-CM | POA: Diagnosis not present

## 2017-12-26 DIAGNOSIS — N138 Other obstructive and reflux uropathy: Secondary | ICD-10-CM | POA: Diagnosis not present

## 2017-12-26 DIAGNOSIS — F0390 Unspecified dementia without behavioral disturbance: Secondary | ICD-10-CM | POA: Diagnosis not present

## 2017-12-26 DIAGNOSIS — C61 Malignant neoplasm of prostate: Secondary | ICD-10-CM | POA: Diagnosis not present

## 2017-12-27 ENCOUNTER — Encounter: Payer: Self-pay | Admitting: *Deleted

## 2017-12-27 ENCOUNTER — Other Ambulatory Visit: Payer: Self-pay | Admitting: *Deleted

## 2017-12-27 ENCOUNTER — Ambulatory Visit: Payer: Medicare Other

## 2017-12-27 NOTE — Patient Outreach (Signed)
[place] [facility]) Care Management [place] Telephone Outreach  Unsuccessful call attempt #1  12/27/2017  [name] [birth year] [id number]  Unsuccessful telephone outreach attempt to [name], 72 y/o male referred to [place] by [facility] telephonic RN CM; original referral a result of [facility] team, after patient experienced recent hospitalization August 31- December 02, 2017 for acute CVA/ hematuria/ hydronephrosis secondary to known prostate cancer; during hospitalization, nephrostomy tube placed.  Patient was discharged from hospital to SNF for short-term rehabilitation; has history including, but not limited to, CKD; diverticulosis; prostate cancer; anemia; sCHF; and malnutrition.  HIPAA compliant voice mail message left for patient, requesting return call back.  Plan:  Will place [facility] CM unsuccessful patient outreach letter in mail requesting call back in writing  Will re-attempt [name] telephone outreach again next week if I do not hear back from patient first.  [name], RN, BSN, [place] Coordinator [facility] Care Management  [phone]

## 2017-12-27 NOTE — Patient Outreach (Signed)
[place] [facility]) Care Management [place] Telephone Outreach Unsuccessful attempt  12/27/2017  ESIAH BAZINET [birth year] [id number]  Unsuccessful telephone outreach attempt to spouse/ caregiver, Quita Skye Jupin [phone]) of [place] [employer]" Kats, 72 y/o male referred to [place] by [facility] telephonic RN CM; original referral a result of [facility] team, after patient experienced recent hospitalization August 31- December 02, 2017 for acute CVA/ hematuria/ hydronephrosis secondary to known prostate cancer; during hospitalization, nephrostomy tube placed.  Patient was discharged from hospital to SNF for short-term rehabilitation; has history including, but not limited to, CKD; diverticulosis; prostate cancer; anemia; sCHF; and malnutrition.  Received voice mail message from Mrs. Quinonez after I placed initial attempt to contact patient earlier today.  Returned Mrs. Elison's call within an hour of her voice mail; HIPAA compliant voice mail message left for caregiver, requesting return call back, and explaining that I would re-attempt call again next week as well.  Plan:  [place] unsuccessful patient outreach letter placed in mail earlier today, requesting call back in writing  Will re-attempt [place] telephone outreach again next week if I do not hear back from patient/ caregiver first.  [name], RN, BSN, [place] Coordinator [facility] Care Management  [phone]

## 2017-12-28 DIAGNOSIS — N139 Obstructive and reflux uropathy, unspecified: Secondary | ICD-10-CM

## 2017-12-30 ENCOUNTER — Ambulatory Visit
Admission: RE | Admit: 2017-12-30 | Discharge: 2017-12-30 | Disposition: A | Discharge status: Home / Self Care | Payer: Medicare Other | Source: Ambulatory Visit | Attending: Radiation Oncology | Admitting: Radiation Oncology

## 2017-12-30 ENCOUNTER — Ambulatory Visit: Payer: Medicare Other

## 2017-12-30 ENCOUNTER — Other Ambulatory Visit: Payer: Self-pay | Admitting: *Deleted

## 2017-12-30 ENCOUNTER — Encounter: Payer: Self-pay | Admitting: *Deleted

## 2017-12-30 ENCOUNTER — Inpatient Hospital Stay: Payer: Medicare Other

## 2017-12-30 DIAGNOSIS — C61 Malignant neoplasm of prostate: Secondary | ICD-10-CM | POA: Diagnosis not present

## 2017-12-30 DIAGNOSIS — Z95828 Presence of other vascular implants and grafts: Secondary | ICD-10-CM

## 2017-12-30 DIAGNOSIS — Z79818 Long term (current) use of other agents affecting estrogen receptors and estrogen levels: Secondary | ICD-10-CM | POA: Diagnosis not present

## 2017-12-30 DIAGNOSIS — Z9221 Personal history of antineoplastic chemotherapy: Secondary | ICD-10-CM | POA: Diagnosis not present

## 2017-12-30 DIAGNOSIS — N1330 Unspecified hydronephrosis: Secondary | ICD-10-CM | POA: Diagnosis not present

## 2017-12-30 DIAGNOSIS — C7951 Secondary malignant neoplasm of bone: Secondary | ICD-10-CM | POA: Diagnosis not present

## 2017-12-30 DIAGNOSIS — R1900 Intra-abdominal and pelvic swelling, mass and lump, unspecified site: Secondary | ICD-10-CM | POA: Diagnosis not present

## 2017-12-30 MED ORDER — LEUPROLIDE ACETATE (4 MONTH) 30 MG IM KIT
30.0000 mg | PACK | Freq: Once | INTRAMUSCULAR | Status: AC
  Administered 2017-12-30: 30 mg via INTRAMUSCULAR
  Filled 2017-12-30: qty 30

## 2017-12-30 NOTE — Telephone Encounter (Signed)
Patient's wife called back and said thank you!

## 2017-12-30 NOTE — Patient Instructions (Signed)
Leuprolide depot injection What is this medicine? LEUPROLIDE (loo PROE lide) is a man-made protein that acts like a natural hormone in the body. It decreases testosterone in men and decreases estrogen in women. In men, this medicine is used to treat advanced prostate cancer. In women, some forms of this medicine may be used to treat endometriosis, uterine fibroids, or other male hormone-related problems. This medicine may be used for other purposes; ask your health care provider or pharmacist if you have questions. COMMON BRAND NAME(S): Eligard, Lupron Depot, Lupron Depot-Ped, Viadur What should I tell my health care provider before I take this medicine? They need to know if you have any of these conditions: -diabetes -heart disease or previous heart attack -high blood pressure -high cholesterol -mental illness -osteoporosis -pain or difficulty passing urine -seizures -spinal cord metastasis -stroke -suicidal thoughts, plans, or attempt; a previous suicide attempt by you or a family member -tobacco smoker -unusual vaginal bleeding (women) -an unusual or allergic reaction to leuprolide, benzyl alcohol, other medicines, foods, dyes, or preservatives -pregnant or trying to get pregnant -breast-feeding How should I use this medicine? This medicine is for injection into a muscle or for injection under the skin. It is given by a health care professional in a hospital or clinic setting. The specific product will determine how it will be given to you. Make sure you understand which product you receive and how often you will receive it. Talk to your pediatrician regarding the use of this medicine in children. Special care may be needed. Overdosage: If you think you have taken too much of this medicine contact a poison control center or emergency room at once. NOTE: This medicine is only for you. Do not share this medicine with others. What if I miss a dose? It is important not to miss a dose.  Call your doctor or health care professional if you are unable to keep an appointment. Depot injections: Depot injections are given either once-monthly, every 12 weeks, every 16 weeks, or every 24 weeks depending on the product you are prescribed. The product you are prescribed will be based on if you are male or male, and your condition. Make sure you understand your product and dosing. What may interact with this medicine? Do not take this medicine with any of the following medications: -chasteberry This medicine may also interact with the following medications: -herbal or dietary supplements, like black cohosh or DHEA -male hormones, like estrogens or progestins and birth control pills, patches, rings, or injections -male hormones, like testosterone This list may not describe all possible interactions. Give your health care provider a list of all the medicines, herbs, non-prescription drugs, or dietary supplements you use. Also tell them if you smoke, drink alcohol, or use illegal drugs. Some items may interact with your medicine. What should I watch for while using this medicine? Visit your doctor or health care professional for regular checks on your progress. During the first weeks of treatment, your symptoms may get worse, but then will improve as you continue your treatment. You may get hot flashes, increased bone pain, increased difficulty passing urine, or an aggravation of nerve symptoms. Discuss these effects with your doctor or health care professional, some of them may improve with continued use of this medicine. Male patients may experience a menstrual cycle or spotting during the first months of therapy with this medicine. If this continues, contact your doctor or health care professional. What side effects may I notice from receiving this medicine? Side   effects that you should report to your doctor or health care professional as soon as possible: -allergic reactions like skin  rash, itching or hives, swelling of the face, lips, or tongue -breathing problems -chest pain -depression or memory disorders -pain in your legs or groin -pain at site where injected or implanted -seizures -severe headache -swelling of the feet and legs -suicidal thoughts or other mood changes -visual changes -vomiting Side effects that usually do not require medical attention (report to your doctor or health care professional if they continue or are bothersome): -breast swelling or tenderness -decrease in sex drive or performance -diarrhea -hot flashes -loss of appetite -muscle, joint, or bone pains -nausea -redness or irritation at site where injected or implanted -skin problems or acne This list may not describe all possible side effects. Call your doctor for medical advice about side effects. You may report side effects to FDA at 1-800-FDA-1088. Where should I keep my medicine? This drug is given in a hospital or clinic and will not be stored at home. NOTE: This sheet is a summary. It may not cover all possible information. If you have questions about this medicine, talk to your doctor, pharmacist, or health care provider.  2018 Elsevier/Gold Standard (2015-08-25 09:45:53)  

## 2017-12-30 NOTE — Patient Outreach (Signed)
[facility]) Care Management [place] Telephone Outreach  12/30/2017  [name] 03-17-1946 [id number]  Successful telephone outreachattemptto spouse/ caregiver, [name] [phone]) of [name] CrumGene" [name], 72 y/o male referred to [place] by [facility] telephonic RN CM; original referral a result of [facility] team,after patient experiencedrecent hospitalization August 31- December 02, 2017 for acute CVA/ hematuria/ hydronephrosis secondary to known prostate cancer; during hospitalization, nephrostomy tube placed.Patientwas discharged from hospital to SNF for short-term rehabilitation;has history including, but not limited to, CKD; diverticulosis; prostate cancer; anemia; sCHF; and malnutrition.  HIPAA/ identity verified with patient's spouse today, and she provided patient telephone and he provided HIPAA identifiers and verbal permission/ preference for me to speak with his wife "at any time" about his care.  Remainder of telephone outreach was completed with patient's spouse; pleasant 50 minute conversation.  [place] role/ services were discussed with caregiver, including how [facility] CCM differs from home health services; patient and wife both provided verbal consent for [facility] CM services.   Today, caregiver reports that patient "is really doing much better, and had a good weekend."  Denies that patient is in pain, has had new/ recent falls, and denies that patient is in distress.  Reports patient will begin radiation treatments this afternoon, which "for now" are expected to last for 2 weeks.  Caregiver further reports:  Medications: -- Has all medicationsand takes as prescribed;denies questions/ concerns about current medications.  -- Verbalizes good general understanding of the purpose, dosing, and scheduling of medications.   -- wife assists with patient's medications by administering them to him from bottle at appropriate time; patient  then takes independently -- denies issues with swallowing medications -- patient was recently discharged from the hospital and all medications were thoroughly reviewed with caregiver today  Home health [facility]) services: -- [place] services for PT, RN, and bath aide in place through [place] [facility]): PT/ RN visiting patient weekly; bath aide visiting "about twice a week" -- confirms that patient has actively participated in [facility] services; reports "unsure" how long home health services will last-- encouraged caregiver to discuss with [facility] RN during time of next visit; discussed and explained process to have [facility] services extended, if appropriate -- private pay in-home assistance through [employer] caregivers: reports patient has bath aide/ in-home assistance every Mon- Wed- Friday form 9:00 am - 1: 00 pm, and that patient has sitter through this program "every night of the week" from 10:00 pm - 7:00 am  Provider appointments: -- All upcoming provider appointments were reviewed with caregiver today; caregiver reports that she transports patient to all scheduled appointments -- as noted above, to start daily radiation treatment t his afternoon; currently expecting 10 daily treatments, with last treatment planned for January 10, 2018  Safety/ Mobility/ Falls: -- denies new/ recent falls: states "no falls" over last year -- assistive devices: none -- general fall risks/ prevention education discussed with caregiver today  Holiday representative needs: -- currently denies community resource needs, stating supportive family members that assist with care needs as indicated, along with private pay in-home care assistants -- family/ spouse provides transportation for patient to all provider appointments, errands, etc -- daughter and her 49 month old son live with patient and caregiver  Advanced Directive (AD) Planning:   --reports does currently have exisisting AD in place for HCPOA and Living Will;  declines desire to make changes today  Self-health management of chronic disease state of prostate  cancer with new nephrostomy tubes: -- patient "progressing well" after recent hospitalization for placement of nephrostomy tubes: using appropriate measures to keep dressings clean and dry; reports skin around tubes "looks good.'  Reports concerns that (L) tube "has started leaking recently;" caregiver states that she has contacted PCP this morning to report/ obtain urology referral if indicated; reports that she has used "additional ziplock type bag" to manage recent leaking, states "working well;" also states that she has discussed with home health nurse, who has assessed/ recommended call to PCP-- positive reinforcement provided to caregiver for promptly notifying PCP about new concern  -- unsure how long nephrostomy tubes will remain in place; states not sure if they are permanent or will eventually be removed/ replaced with "bladder tube;" encouraged caregiver and patient to develop a list of their personal questions around overall plan of care for discussion with oncologist -- patient has porta-cath intact to (R) anterior chest; reports skin around area "looks fine."  Reports port primarily used for lab draws -- patient appetite "good," dos not follow any specific diet; reports weight loss of 20-plus pounds over "last few months" related to treatment for cancer -- reports commitment to adherence to established radiation treatments, to start this afternoon.     Patient/ caregiver denies further issues, concerns, or problems today.  I provided/ confirmed that patient has my direct phone number, the main [facility] CM office phone number, and the [facility] CM 24-hour nurse advice phone number should issues arise prior to next scheduled [place] outreach by phone next week.  Encouraged patient to contact me directly if needs, questions, issues, or concerns arise prior to next scheduled outreach; patient agreed  to do so.  Plan:  Patient will take all medications as prescribed and will attend all scheduled provider appointments and radiation treatments  Patient will continue actively participating in home health services as scheduled post- SNF discharge in September 2019  I will make patient's PCP aware of [facility] CM involvement in his care- will send barriers letter  [place] telephone outreach to continue with scheduled telephone call next week    [facility] CM Care Plan Problem One     Most Recent Value  Care Plan Problem One  Uncertainty related to long term plan of care in patient with prostate cancer, as evidenced by patient and caregiver reporting   Role Documenting the Problem One  Care Management [place] for Problem One  Active  [facility] Long Term Goal   Over the next 60 days, patient/ caregiver will verbalize plan of care for patient at home, as evidenced by patient/ caregiver reporting during [place] outreach  [place] Term Goal Start Date  12/30/17  Interventions for Problem One Long Term Goal  Discussed with patient and caregiver role/ services of [place] program,  discussed patient and caregiver current understanding of plan of care and reviewed all upcoming provider appointments with caregiver and confirmed that patient has reliable transportation to all scheduled appointments.  Medication review completed with caregiver today.  [place] program initiated  [facility] CM Short Term Goal #1   Over the next 30 days, patient will actively participate in home health services as ordered post- SNF discharge, as evidenced by patient/ caregiver reporting and collaboration as indicated with [facility] team, during [place] outreach  [facility] CM Short Term Goal #1 Start Date  12/30/17  Interventions for Short Term Goal #1  Discussed with patient's spouse difference  between [place] program and [facility] services,  confirmed that patient is actively participating in [facility] services and has  contact information for [facility] team,  discussed process of having [place] services extended if needed,  encouraged patient's ongoing active participation in [facility] services,  discussed patient's current private pay in-home assistance with caregiver  [facility] CM Short Term Goal #2   Over the next 30 days, patient will attend all scheduled radiation appointments, as indictaed by patient reporting and review of EMR/ provider collaboratiuon as indicated during [place] outreach  [facility] CM Short Term Goal #2 Start Date  12/30/17  Interventions for Short Term Goal #2  Discussed overall plan of care with patient's spouse,  confirmed that patient plans to attend all scheduled radiation treatments and that spouse will provide transportation     I appreciate the opportunity to participate in [name]'s care,   [name], RN, BSN, [employer] Coordinator [facility] Care Management  [phone]

## 2017-12-31 ENCOUNTER — Ambulatory Visit: Payer: Medicare Other

## 2017-12-31 ENCOUNTER — Ambulatory Visit (INDEPENDENT_AMBULATORY_CARE_PROVIDER_SITE_OTHER): Payer: Medicare Other | Admitting: Family Medicine

## 2017-12-31 ENCOUNTER — Ambulatory Visit
Admission: RE | Admit: 2017-12-31 | Discharge: 2017-12-31 | Disposition: A | Discharge status: Home / Self Care | Payer: Medicare Other | Source: Ambulatory Visit | Attending: Radiation Oncology | Admitting: Radiation Oncology

## 2017-12-31 DIAGNOSIS — D631 Anemia in chronic kidney disease: Secondary | ICD-10-CM | POA: Diagnosis not present

## 2017-12-31 DIAGNOSIS — N139 Obstructive and reflux uropathy, unspecified: Secondary | ICD-10-CM

## 2017-12-31 DIAGNOSIS — C7951 Secondary malignant neoplasm of bone: Secondary | ICD-10-CM | POA: Diagnosis not present

## 2017-12-31 DIAGNOSIS — C61 Malignant neoplasm of prostate: Secondary | ICD-10-CM | POA: Diagnosis not present

## 2017-12-31 DIAGNOSIS — F0390 Unspecified dementia without behavioral disturbance: Secondary | ICD-10-CM | POA: Diagnosis not present

## 2017-12-31 DIAGNOSIS — Z436 Encounter for attention to other artificial openings of urinary tract: Secondary | ICD-10-CM | POA: Diagnosis not present

## 2017-12-31 DIAGNOSIS — N189 Chronic kidney disease, unspecified: Secondary | ICD-10-CM | POA: Diagnosis not present

## 2017-12-31 DIAGNOSIS — N138 Other obstructive and reflux uropathy: Secondary | ICD-10-CM | POA: Diagnosis not present

## 2017-12-31 NOTE — Progress Notes (Signed)
Patient presents today with leaking from the connection on his Nephrostomy tube. A new tube and bag were placed and draining was noticed. Patient tolerated well.

## 2018-01-01 ENCOUNTER — Ambulatory Visit
Admission: RE | Admit: 2018-01-01 | Discharge: 2018-01-01 | Disposition: A | Discharge status: Home / Self Care | Payer: Medicare Other | Source: Ambulatory Visit | Attending: Radiation Oncology | Admitting: Radiation Oncology

## 2018-01-01 ENCOUNTER — Ambulatory Visit: Payer: Medicare Other

## 2018-01-01 DIAGNOSIS — N138 Other obstructive and reflux uropathy: Secondary | ICD-10-CM | POA: Diagnosis not present

## 2018-01-01 DIAGNOSIS — N189 Chronic kidney disease, unspecified: Secondary | ICD-10-CM | POA: Diagnosis not present

## 2018-01-01 DIAGNOSIS — C7951 Secondary malignant neoplasm of bone: Secondary | ICD-10-CM | POA: Diagnosis not present

## 2018-01-01 DIAGNOSIS — C61 Malignant neoplasm of prostate: Secondary | ICD-10-CM | POA: Diagnosis not present

## 2018-01-01 DIAGNOSIS — F0390 Unspecified dementia without behavioral disturbance: Secondary | ICD-10-CM | POA: Diagnosis not present

## 2018-01-01 DIAGNOSIS — D631 Anemia in chronic kidney disease: Secondary | ICD-10-CM | POA: Diagnosis not present

## 2018-01-01 DIAGNOSIS — Z436 Encounter for attention to other artificial openings of urinary tract: Secondary | ICD-10-CM | POA: Diagnosis not present

## 2018-01-02 ENCOUNTER — Ambulatory Visit
Admission: RE | Admit: 2018-01-02 | Discharge: 2018-01-02 | Disposition: A | Discharge status: Home / Self Care | Payer: Medicare Other | Source: Ambulatory Visit | Attending: Radiation Oncology | Admitting: Radiation Oncology

## 2018-01-02 ENCOUNTER — Ambulatory Visit: Payer: Medicare Other

## 2018-01-02 DIAGNOSIS — D631 Anemia in chronic kidney disease: Secondary | ICD-10-CM | POA: Diagnosis not present

## 2018-01-02 DIAGNOSIS — N138 Other obstructive and reflux uropathy: Secondary | ICD-10-CM | POA: Diagnosis not present

## 2018-01-02 DIAGNOSIS — F0390 Unspecified dementia without behavioral disturbance: Secondary | ICD-10-CM | POA: Diagnosis not present

## 2018-01-02 DIAGNOSIS — C7951 Secondary malignant neoplasm of bone: Secondary | ICD-10-CM | POA: Diagnosis not present

## 2018-01-02 DIAGNOSIS — C61 Malignant neoplasm of prostate: Secondary | ICD-10-CM | POA: Diagnosis not present

## 2018-01-02 DIAGNOSIS — N189 Chronic kidney disease, unspecified: Secondary | ICD-10-CM | POA: Diagnosis not present

## 2018-01-02 DIAGNOSIS — Z436 Encounter for attention to other artificial openings of urinary tract: Secondary | ICD-10-CM | POA: Diagnosis not present

## 2018-01-03 ENCOUNTER — Ambulatory Visit
Admission: RE | Admit: 2018-01-03 | Discharge: 2018-01-03 | Disposition: A | Discharge status: Home / Self Care | Payer: Medicare Other | Source: Ambulatory Visit | Attending: Radiation Oncology | Admitting: Radiation Oncology

## 2018-01-03 ENCOUNTER — Ambulatory Visit: Payer: Medicare Other

## 2018-01-03 DIAGNOSIS — C61 Malignant neoplasm of prostate: Secondary | ICD-10-CM | POA: Diagnosis not present

## 2018-01-03 DIAGNOSIS — C7951 Secondary malignant neoplasm of bone: Secondary | ICD-10-CM | POA: Diagnosis not present

## 2018-01-06 ENCOUNTER — Ambulatory Visit
Admission: RE | Admit: 2018-01-06 | Discharge: 2018-01-06 | Disposition: A | Discharge status: Home / Self Care | Payer: Medicare Other | Source: Ambulatory Visit | Attending: Radiation Oncology | Admitting: Radiation Oncology

## 2018-01-06 ENCOUNTER — Encounter: Payer: Self-pay | Admitting: *Deleted

## 2018-01-06 ENCOUNTER — Other Ambulatory Visit: Payer: Self-pay | Admitting: *Deleted

## 2018-01-06 DIAGNOSIS — C7951 Secondary malignant neoplasm of bone: Secondary | ICD-10-CM | POA: Diagnosis not present

## 2018-01-06 DIAGNOSIS — C61 Malignant neoplasm of prostate: Secondary | ICD-10-CM | POA: Diagnosis not present

## 2018-01-06 NOTE — Patient Outreach (Signed)
[place] [facility]) Care Management [place] Telephone Outreach  01/06/2018  DETRIC SCALISI [birth year] [id number]  Successfultelephone outreachattempttospouse/ caregiver, Francis Doenges [phone]) ofJames "Gene" [name], 72 y/o male referred to [place] by [facility] telephonic RN CM; original referral a result of [facility] team,after patient experiencedrecent hospitalization August 31- December 02, 2017 for acute CVA/ hematuria/ hydronephrosis secondary to known prostate cancer; during hospitalization, nephrostomy tube placed.Patientwas discharged from hospital to SNF for short-term rehabilitation;has history including, but not limited to, CKD; diverticulosis; prostate cancer; anemia; sCHF; and malnutrition.  HIPAA/ identity verified with patient and spouse today, as both participate in call with phone on speaker mode.  Today, patient and caregiver reports that patient "is still doing a lot better, and had a good weekend."  Patient reports intermittent (R) shoulder pain today, which he states has been "on and off" since his surgery for nephrostomy tube placement; described pain as "absent" when he is at rest, but at "5 or 6/ 10" with activity/ repositioning; reports he has been using massage, heat, and "muscle ointment" which helps; occasionally using tylenol.  Advised patient to discuss this pain with care providers, as he reports he has not yet reported this pain; patient/ spouse agree to do so.  Denies new/ recent falls, still not using assistive devices as "no need, walking fine."  Patient/ Caregiver further report:  -- no issues/ concerns/ questions around current medications; continues taking as prescribed -- home health services for nursing through [facility]) continue:  Wife reports that she has been learning "as much as possible" before home health services are completed; expects for services to last for "at least another couple of weeks;"   Reports that [facility] nurse is scheduled to visit patient again tomorrow- encouraged ongoing active participation; discussed with both possibility of [place] home visit, if needed.  Patient and spouse both report today that they would like to "wait to decide" until home health services are completed, as patient continues "doing so much better." -- has attended all scheduled appointments for daily radiation treatments; reports "going fine, a little tired, but no real problems."  Will continue attending all treatments as scheduled for the remainder of this week; wife states that they asked radiation oncologist about possibility of patient travelling to take a beach trip next week- states that provider approved this, as long as patient was feeling well; reports hoping to to go beach next week. -- obtained and attended urology provider appointment last week for previously reported leaking nephrostomy tube-- reports "now resolved," denies further concerns around nephrostomy tubes/ drainage; states wife has started assisting patient in showering, and have been keeping dressings dry and clean, using techniques learned from home health bath aide/ nurse- positive reinforcement provided. -- private pay care assistants through [employer] continue, however, patient/ spouse report "things going so well," they may begin decreasing the amount of time the caregivers assist.  Patient/ caregiver denies further issues, concerns, or problems today. I confirmed that patient hasmy direct phone number, the main [facility] CM office phone number, and the [facility] CM 24-hour nurse advice phone number should issues arise prior to next scheduled [place] outreach by phone in 2 weeks, after patient's hopeful trip to beach.  Encouraged patient/ spouse to contact me directly if needs, questions, issues, or concerns arise prior to next scheduled outreach; patient agreed to do so.  Plan:  Patient will take all medications as  prescribed and will attend all scheduled provider  appointments and radiation treatments  Patient will continue actively participating in home health services as scheduled post- SNF discharge in September 2019  [place] telephone outreach to continue with scheduled telephone call in 2 weeks   [facility] CM Care Plan Problem One     Most Recent Value  Care Plan Problem One  Uncertainty related to long term plan of care in patient with prostate cancer, as evidenced by patient and caregiver reporting   Role Documenting the Problem One  Care Management [place] for Problem One  Active  [facility] Long Term Goal   Over the next 60 days, patient/ caregiver will verbalize plan of care for patient at home, as evidenced by patient/ caregiver reporting during [place] outreach  [place] Term Goal Start Date  12/30/17  Interventions for Problem One Long Term Goal  Discussed with patient and spouse cuurent use of in-home private duty care assistance,  discussed patient's current clinical conditon,  discussed with patient and spouse need to promptly contact care providers for any new concerns, issues or problems that arise.  Cinfirmed that patient obtained and attended urology appointment for assessment of bilateral nephrostomy tubes  [facility] CM Short Term Goal #1   Over the next 30 days, patient will actively participate in home health services as ordered post- SNF discharge, as evidenced by patient/ caregiver reporting and collaboration as indicated with [facility] team, during [place] outreach  [facility] CM Short Term Goal #1 Start Date  12/30/17  Interventions for Short Term Goal #1  Confirmed that patient continues actively participating in home health services as ordered post-hospital discharge,  reviewed previous and upcoming home health visits,  discussed value of/ importance of spouse working with [facility] RN to learn about care of nephrostomy tubes and porta-cath dressing   [facility] CM Short Term Goal #2   Over the  next 30 days, patient will attend all scheduled radiation appointments, as indictaed by patient reporting and review of EMR/ provider collaboratiuon as indicated during [place] outreach  [facility] CM Short Term Goal #2 Start Date  12/30/17  Interventions for Short Term Goal #2  Discussed with patient his toleration of radiation last week,  confirmed that patientwill attend all scheduled appointments this week,  encouraged patient/ spouse to discuss patient's (R) shoulder pain with oncology providers for specific treatment guidelines     [name], RN, BSN, [place] Care Management  [phone]

## 2018-01-07 ENCOUNTER — Ambulatory Visit
Admission: RE | Admit: 2018-01-07 | Discharge: 2018-01-07 | Disposition: A | Discharge status: Home / Self Care | Payer: Medicare Other | Source: Ambulatory Visit | Attending: Radiation Oncology | Admitting: Radiation Oncology

## 2018-01-07 DIAGNOSIS — Z436 Encounter for attention to other artificial openings of urinary tract: Secondary | ICD-10-CM | POA: Diagnosis not present

## 2018-01-07 DIAGNOSIS — D631 Anemia in chronic kidney disease: Secondary | ICD-10-CM | POA: Diagnosis not present

## 2018-01-07 DIAGNOSIS — C7951 Secondary malignant neoplasm of bone: Secondary | ICD-10-CM | POA: Diagnosis not present

## 2018-01-07 DIAGNOSIS — N138 Other obstructive and reflux uropathy: Secondary | ICD-10-CM | POA: Diagnosis not present

## 2018-01-07 DIAGNOSIS — N189 Chronic kidney disease, unspecified: Secondary | ICD-10-CM | POA: Diagnosis not present

## 2018-01-07 DIAGNOSIS — F0390 Unspecified dementia without behavioral disturbance: Secondary | ICD-10-CM | POA: Diagnosis not present

## 2018-01-07 DIAGNOSIS — C61 Malignant neoplasm of prostate: Secondary | ICD-10-CM | POA: Diagnosis not present

## 2018-01-08 ENCOUNTER — Ambulatory Visit
Admission: RE | Admit: 2018-01-08 | Discharge: 2018-01-08 | Disposition: A | Discharge status: Home / Self Care | Payer: Medicare Other | Source: Ambulatory Visit | Attending: Radiation Oncology | Admitting: Radiation Oncology

## 2018-01-08 DIAGNOSIS — C61 Malignant neoplasm of prostate: Secondary | ICD-10-CM | POA: Diagnosis not present

## 2018-01-08 DIAGNOSIS — C7951 Secondary malignant neoplasm of bone: Secondary | ICD-10-CM | POA: Diagnosis not present

## 2018-01-09 ENCOUNTER — Other Ambulatory Visit: Payer: Self-pay | Admitting: *Deleted

## 2018-01-09 ENCOUNTER — Ambulatory Visit
Admission: RE | Admit: 2018-01-09 | Discharge: 2018-01-09 | Disposition: A | Discharge status: Home / Self Care | Payer: Medicare Other | Source: Ambulatory Visit | Attending: Radiation Oncology | Admitting: Radiation Oncology

## 2018-01-09 DIAGNOSIS — N138 Other obstructive and reflux uropathy: Secondary | ICD-10-CM | POA: Diagnosis not present

## 2018-01-09 DIAGNOSIS — N189 Chronic kidney disease, unspecified: Secondary | ICD-10-CM | POA: Diagnosis not present

## 2018-01-09 DIAGNOSIS — Z436 Encounter for attention to other artificial openings of urinary tract: Secondary | ICD-10-CM | POA: Diagnosis not present

## 2018-01-09 DIAGNOSIS — C61 Malignant neoplasm of prostate: Secondary | ICD-10-CM | POA: Diagnosis not present

## 2018-01-09 DIAGNOSIS — D631 Anemia in chronic kidney disease: Secondary | ICD-10-CM | POA: Diagnosis not present

## 2018-01-09 DIAGNOSIS — C7951 Secondary malignant neoplasm of bone: Secondary | ICD-10-CM | POA: Diagnosis not present

## 2018-01-09 DIAGNOSIS — F0390 Unspecified dementia without behavioral disturbance: Secondary | ICD-10-CM | POA: Diagnosis not present

## 2018-01-09 NOTE — Patient Outreach (Signed)
[facility]) Care Management [place] Coordination  01/09/2018  [name] 1945-04-04 [id number]  Successfultelephone outreachattempttospouse/ caregiver, [name] [phone]) ofJames "Gene" [name], 72 y/o male referred to [place] by [facility] telephonic RN CM; original referral a result of [facility] team,after patient experiencedrecent hospitalization August 31- December 02, 2017 for acute CVA/ hematuria/ hydronephrosis secondary to known prostate cancer; during hospitalization, nephrostomy tube placed.Patientwas discharged from hospital to SNF for short-term rehabilitation;has history including, but not limited to, CKD; diverticulosis; prostate cancer; anemia; sCHF; and malnutrition. HIPAA/ identity verified with patient and spouse today, as both participate in call with phone on speaker mode.  Returned call to caregiver, as she had requested in a voice mail message; spouse reports that she and patient have made plans to go out of town for a few days at time of our next scheduled phone call; requested that we re-schedule call, and this was completed during phone call today.  Plan:  [place] telephone outreachto continue with scheduled telephone callin 2 weeks  [name], RN, BSN, [employer] Coordinator [facility] Care Management  [phone]

## 2018-01-10 ENCOUNTER — Ambulatory Visit
Admission: RE | Admit: 2018-01-10 | Discharge: 2018-01-10 | Disposition: A | Discharge status: Home / Self Care | Payer: Medicare Other | Source: Ambulatory Visit | Attending: Radiation Oncology | Admitting: Radiation Oncology

## 2018-01-10 DIAGNOSIS — C61 Malignant neoplasm of prostate: Secondary | ICD-10-CM | POA: Diagnosis not present

## 2018-01-10 DIAGNOSIS — C7951 Secondary malignant neoplasm of bone: Secondary | ICD-10-CM | POA: Diagnosis not present

## 2018-01-14 DIAGNOSIS — C61 Malignant neoplasm of prostate: Secondary | ICD-10-CM | POA: Diagnosis not present

## 2018-01-14 DIAGNOSIS — N138 Other obstructive and reflux uropathy: Secondary | ICD-10-CM | POA: Diagnosis not present

## 2018-01-14 DIAGNOSIS — F0390 Unspecified dementia without behavioral disturbance: Secondary | ICD-10-CM | POA: Diagnosis not present

## 2018-01-14 DIAGNOSIS — N189 Chronic kidney disease, unspecified: Secondary | ICD-10-CM | POA: Diagnosis not present

## 2018-01-14 DIAGNOSIS — D631 Anemia in chronic kidney disease: Secondary | ICD-10-CM | POA: Diagnosis not present

## 2018-01-14 DIAGNOSIS — Z436 Encounter for attention to other artificial openings of urinary tract: Secondary | ICD-10-CM | POA: Diagnosis not present

## 2018-01-16 ENCOUNTER — Telehealth: Payer: Self-pay | Admitting: *Deleted

## 2018-01-16 NOTE — Telephone Encounter (Signed)
Noted Glad he is doing well

## 2018-01-16 NOTE — Telephone Encounter (Signed)
Copied from [place] [id number]. Topic: General - Other >> Jan 16, 2018 10:20 AM Scherrie Gerlach wrote: Reason for CRM: [name] with [facility] states the pt is cancelling his visits this week dur to sick grandson.  Pt states he is doing fine.

## 2018-01-20 ENCOUNTER — Encounter: Payer: Self-pay | Admitting: Radiation Oncology

## 2018-01-20 ENCOUNTER — Ambulatory Visit: Payer: Medicare Other | Admitting: *Deleted

## 2018-01-20 NOTE — Progress Notes (Signed)
  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  Beams/energy:   Complex Isodose Plan with 15X photons.  Narrative: The patient tolerated radiation treatment relatively well. He had nephrostomy tubes in place. He reported low back pain, urinary leakage, and mild fatigue throughout treatment. Towards the beginning of treatments, he reported a burning pain when rising from sitting/laying to standing and dysuria. He denied hematuria, bowel complaints, and nausea or vomiting.    Plan: The patient has completed radiation treatment. The patient will return to radiation oncology clinic for routine followup in one month. I advised him to call or return sooner if he has any questions or concerns related to his recovery or treatment. ________________________________  Sheral Apley. Tammi Klippel, M.D.  This document serves as a record of services personally performed by Tyler Pita, MD. It was created on his behalf by Wilburn Mylar, a trained medical scribe. The creation of this record is based on the scribe's personal observations and the provider's statements to them. This document has been checked and approved by the attending provider.

## 2018-01-21 DIAGNOSIS — N138 Other obstructive and reflux uropathy: Secondary | ICD-10-CM | POA: Diagnosis not present

## 2018-01-21 DIAGNOSIS — Z436 Encounter for attention to other artificial openings of urinary tract: Secondary | ICD-10-CM | POA: Diagnosis not present

## 2018-01-21 DIAGNOSIS — N189 Chronic kidney disease, unspecified: Secondary | ICD-10-CM | POA: Diagnosis not present

## 2018-01-21 DIAGNOSIS — F0390 Unspecified dementia without behavioral disturbance: Secondary | ICD-10-CM | POA: Diagnosis not present

## 2018-01-21 DIAGNOSIS — D631 Anemia in chronic kidney disease: Secondary | ICD-10-CM | POA: Diagnosis not present

## 2018-01-21 DIAGNOSIS — C61 Malignant neoplasm of prostate: Secondary | ICD-10-CM | POA: Diagnosis not present

## 2018-01-22 DIAGNOSIS — N189 Chronic kidney disease, unspecified: Secondary | ICD-10-CM | POA: Diagnosis not present

## 2018-01-22 DIAGNOSIS — Z436 Encounter for attention to other artificial openings of urinary tract: Secondary | ICD-10-CM | POA: Diagnosis not present

## 2018-01-22 DIAGNOSIS — D631 Anemia in chronic kidney disease: Secondary | ICD-10-CM | POA: Diagnosis not present

## 2018-01-22 DIAGNOSIS — F0390 Unspecified dementia without behavioral disturbance: Secondary | ICD-10-CM | POA: Diagnosis not present

## 2018-01-22 DIAGNOSIS — N138 Other obstructive and reflux uropathy: Secondary | ICD-10-CM | POA: Diagnosis not present

## 2018-01-22 DIAGNOSIS — C61 Malignant neoplasm of prostate: Secondary | ICD-10-CM | POA: Diagnosis not present

## 2018-01-23 ENCOUNTER — Encounter: Payer: Self-pay | Admitting: *Deleted

## 2018-01-23 ENCOUNTER — Other Ambulatory Visit: Payer: Self-pay | Admitting: *Deleted

## 2018-01-23 NOTE — Patient Outreach (Signed)
[name]) Care Management [place] Telephone Outreach  01/23/2018  [name] 1945-12-26 [id number]  Successfultelephone outreachattempttospouse/ caregiver, [name] [phone]) ofJames "Gene" [name], 72 y/o male referred to [place] by [facility] telephonic RN CM; original referral a result of [facility] team,after patient experiencedrecent hospitalization August 31- December 02, 2017 for acute CVA/ hematuria/ hydronephrosis secondary to known prostate cancer; during hospitalization, nephrostomy tube placed.Patientwas discharged from hospital to SNF for short-term rehabilitation;has history including, but not limited to, CKD; diverticulosis; prostate cancer; anemia; sCHF; and malnutrition. HIPAA/ identity verified with patient's spouse today.  Today, spouse reports that patient "is getting along fine."  Reports patient continues to experience intermittent (R) shoulder pain, and that he has not yet reached out to care providers to discuss-- this was again encouraged, and spouse stated that she will do. Reports patient has continued using massage, heat, and "muscle ointment" which helps "short-term." Occasionally using tylenol.    Reports that patient has "returned to work for a couple of hours a day."  States this has lifted his spirits.  Reports patient is currently working.  Denies new/ recent falls, still not using assistive devices as "no need, walking fine."  Caregiver further reports:  -- no issues/ concerns/ questions around current medications; continues taking as prescribed -- home health services for nursing through [place]) continue:  Wife reports that services will continue "for a few more weeks."  Confirms that they have contact information for home health team.  Reports that she and patient have stopped using [facility] private care assistants, as patient is "doing so well." -- attended all scheduled  appointments for radiation treatments; reports "glad they are over."  Upcoming provider appointments reviewed with patient's spouse and she confirms that she will continue transporting patient to all scheduled appointments. -- no issues around nephrostomy tubes; reports "things calmed down;" and denies concerns/ needs around this aspect of patient's care at home.  Spousedenies further issues, concerns, or problems today. Iconfirmed that patient hasmy direct phone number, the main [facility] CM office phone number, and the [facility] CM 24-hour nurse advice phone number should issues arise prior to next scheduled [place] outreachby phone in 2 -3 weeks. Discussed with spouse that patient has thus far made great progress in meeting his previously established goals, and discussed possibility of [facility] CCM case closure in future, which spouse agrees with today. Encouraged patient/ spouse to contact me directly if needs, questions, issues, or concerns arise prior to next scheduled outreach; patient agreed to do so.  Plan:  Patient will take all medications as prescribed and will attend all scheduled provider appointments   Patient will continue actively participating in home health services as scheduled post- SNF discharge in September 2019  [place] telephone outreachto continue with scheduled telephone callin 2 -3 weeks  [facility] CM Care Plan Problem One     Most Recent Value  Care Plan Problem One  Uncertainty related to long term plan of care in patient with prostate cancer, as evidenced by patient and caregiver reporting   Role Documenting the Problem One  Care Management [place] for Problem One  Active  [facility] Long Term Goal   Over the next 60 days, patient/ caregiver will verbalize plan of care for patient at home, as evidenced by patient/ caregiver reporting during [place] outreach  [place] Term Goal Start Date  12/30/17  Interventions for Problem One Long Term Goal  Discussed with patient's caregiver patient's current clinical condition,  discussed ongoing care needs and current in-home care in place   [facility] CM Short Term Goal #1   Over the next 30 days, patient will actively participate in home health services as ordered post- SNF discharge, as evidenced by patient/ caregiver reporting and collaboration as indicated with [facility] team, during [place] outreach  [facility] CM Short Term Goal #1 Start Date  12/30/17  [facility] CM Short Term Goal #1 Met Date  01/23/18 [Goal met]  Interventions for Short Term Goal #1  Discussed with caregiver current home health services in place and confirmed that patient is currently actively participating in home health services and has contact information for home health services  [facility] CM Short Term Goal #2   Over the next 30 days, patient will attend all scheduled radiation appointments, as indictaed by patient reporting and review of EMR/ provider collaboratiuon as indicated during [place] outreach  [facility] CM Short Term Goal #2 Start Date  12/30/17  [facility] CM Short Term Goal #2 Met Date  01/23/18 [Goal met]  Interventions for Short Term Goal #2  confirmed with caregiver/ spouse that patient has attended all radiation treatments,  discussed upcoming provider appointments with spouse and confirmed that she will continue transporting patient to all provider appointments     [name], RN, BSN, [place] Care Management  [phone]

## 2018-01-24 DIAGNOSIS — F0390 Unspecified dementia without behavioral disturbance: Secondary | ICD-10-CM | POA: Diagnosis not present

## 2018-01-24 DIAGNOSIS — N138 Other obstructive and reflux uropathy: Secondary | ICD-10-CM | POA: Diagnosis not present

## 2018-01-24 DIAGNOSIS — Z436 Encounter for attention to other artificial openings of urinary tract: Secondary | ICD-10-CM | POA: Diagnosis not present

## 2018-01-24 DIAGNOSIS — D631 Anemia in chronic kidney disease: Secondary | ICD-10-CM | POA: Diagnosis not present

## 2018-01-24 DIAGNOSIS — N189 Chronic kidney disease, unspecified: Secondary | ICD-10-CM | POA: Diagnosis not present

## 2018-01-24 DIAGNOSIS — C61 Malignant neoplasm of prostate: Secondary | ICD-10-CM | POA: Diagnosis not present

## 2018-01-28 ENCOUNTER — Inpatient Hospital Stay: Payer: Medicare Other | Attending: Oncology

## 2018-01-28 ENCOUNTER — Inpatient Hospital Stay: Payer: Medicare Other

## 2018-01-28 DIAGNOSIS — Z936 Other artificial openings of urinary tract status: Secondary | ICD-10-CM | POA: Diagnosis not present

## 2018-01-28 DIAGNOSIS — R5383 Other fatigue: Secondary | ICD-10-CM | POA: Insufficient documentation

## 2018-01-28 DIAGNOSIS — Z9221 Personal history of antineoplastic chemotherapy: Secondary | ICD-10-CM | POA: Insufficient documentation

## 2018-01-28 DIAGNOSIS — R630 Anorexia: Secondary | ICD-10-CM | POA: Insufficient documentation

## 2018-01-28 DIAGNOSIS — Z79818 Long term (current) use of other agents affecting estrogen receptors and estrogen levels: Secondary | ICD-10-CM | POA: Diagnosis not present

## 2018-01-28 DIAGNOSIS — F0390 Unspecified dementia without behavioral disturbance: Secondary | ICD-10-CM | POA: Diagnosis not present

## 2018-01-28 DIAGNOSIS — C61 Malignant neoplasm of prostate: Secondary | ICD-10-CM | POA: Diagnosis not present

## 2018-01-28 DIAGNOSIS — Z923 Personal history of irradiation: Secondary | ICD-10-CM | POA: Insufficient documentation

## 2018-01-28 DIAGNOSIS — Z79899 Other long term (current) drug therapy: Secondary | ICD-10-CM | POA: Insufficient documentation

## 2018-01-28 DIAGNOSIS — R9721 Rising PSA following treatment for malignant neoplasm of prostate: Secondary | ICD-10-CM | POA: Diagnosis not present

## 2018-01-28 DIAGNOSIS — Z436 Encounter for attention to other artificial openings of urinary tract: Secondary | ICD-10-CM | POA: Diagnosis not present

## 2018-01-28 DIAGNOSIS — F418 Other specified anxiety disorders: Secondary | ICD-10-CM | POA: Diagnosis not present

## 2018-01-28 DIAGNOSIS — N1330 Unspecified hydronephrosis: Secondary | ICD-10-CM | POA: Diagnosis not present

## 2018-01-28 DIAGNOSIS — R197 Diarrhea, unspecified: Secondary | ICD-10-CM | POA: Insufficient documentation

## 2018-01-28 DIAGNOSIS — Z95828 Presence of other vascular implants and grafts: Secondary | ICD-10-CM

## 2018-01-28 DIAGNOSIS — C7951 Secondary malignant neoplasm of bone: Secondary | ICD-10-CM | POA: Insufficient documentation

## 2018-01-28 DIAGNOSIS — N189 Chronic kidney disease, unspecified: Secondary | ICD-10-CM | POA: Diagnosis not present

## 2018-01-28 DIAGNOSIS — N138 Other obstructive and reflux uropathy: Secondary | ICD-10-CM | POA: Diagnosis not present

## 2018-01-28 DIAGNOSIS — D631 Anemia in chronic kidney disease: Secondary | ICD-10-CM | POA: Diagnosis not present

## 2018-01-28 LAB — CBC WITH DIFFERENTIAL (CANCER CENTER ONLY)
ABS IMMATURE GRANULOCYTES: 0.05 10*3/uL (ref 0.00–0.07)
BASOS ABS: 0 10*3/uL (ref 0.0–0.1)
Basophils Relative: 0 %
Eosinophils Absolute: 0 10*3/uL (ref 0.0–0.5)
Eosinophils Relative: 1 %
HEMATOCRIT: 27.8 % — AB (ref 39.0–52.0)
HEMOGLOBIN: 9 g/dL — AB (ref 13.0–17.0)
IMMATURE GRANULOCYTES: 1 %
LYMPHS ABS: 0.5 10*3/uL — AB (ref 0.7–4.0)
LYMPHS PCT: 7 %
MCH: 29 pg (ref 26.0–34.0)
MCHC: 32.4 g/dL (ref 30.0–36.0)
MCV: 89.7 fL (ref 80.0–100.0)
MONOS PCT: 10 %
Monocytes Absolute: 0.8 10*3/uL (ref 0.1–1.0)
NEUTROS ABS: 6.2 10*3/uL (ref 1.7–7.7)
NEUTROS PCT: 81 %
NRBC: 0 % (ref 0.0–0.2)
Platelet Count: 264 10*3/uL (ref 150–400)
RBC: 3.1 MIL/uL — ABNORMAL LOW (ref 4.22–5.81)
RDW: 15.9 % — AB (ref 11.5–15.5)
WBC Count: 7.6 10*3/uL (ref 4.0–10.5)

## 2018-01-28 LAB — CMP (CANCER CENTER ONLY)
ALBUMIN: 2.7 g/dL — AB (ref 3.5–5.0)
ALK PHOS: 84 U/L (ref 38–126)
ALT: 8 U/L (ref 0–44)
ANION GAP: 9 (ref 5–15)
AST: 11 U/L — ABNORMAL LOW (ref 15–41)
BUN: 38 mg/dL — ABNORMAL HIGH (ref 8–23)
CALCIUM: 9.2 mg/dL (ref 8.9–10.3)
CHLORIDE: 110 mmol/L (ref 98–111)
CO2: 18 mmol/L — AB (ref 22–32)
Creatinine: 2.11 mg/dL — ABNORMAL HIGH (ref 0.61–1.24)
GFR, EST AFRICAN AMERICAN: 35 mL/min — AB (ref >60)
GFR, Estimated: 30 mL/min — ABNORMAL LOW (ref >60)
GLUCOSE: 95 mg/dL (ref 70–99)
POTASSIUM: 4.1 mmol/L (ref 3.5–5.1)
SODIUM: 137 mmol/L (ref 135–145)
Total Bilirubin: <0.2 mg/dL — ABNORMAL LOW (ref 0.3–1.2)
Total Protein: 6.6 g/dL (ref 6.5–8.1)

## 2018-01-28 MED ORDER — HEPARIN SOD (PORK) LOCK FLUSH 100 UNIT/ML IV SOLN
500.0000 [IU] | Freq: Once | INTRAVENOUS | Status: AC
  Administered 2018-01-28: 500 [IU]
  Filled 2018-01-28: qty 5

## 2018-01-28 MED ORDER — SODIUM CHLORIDE 0.9% FLUSH
10.0000 mL | Freq: Once | INTRAVENOUS | Status: AC
  Administered 2018-01-28: 10 mL
  Filled 2018-01-28: qty 10

## 2018-01-29 ENCOUNTER — Inpatient Hospital Stay (HOSPITAL_BASED_OUTPATIENT_CLINIC_OR_DEPARTMENT_OTHER): Payer: Medicare Other | Admitting: Oncology

## 2018-01-29 ENCOUNTER — Telehealth: Payer: Self-pay

## 2018-01-29 VITALS — BP 127/84 | HR 84 | Temp 97.8°F | Resp 18 | Ht 72.0 in | Wt 166.4 lb

## 2018-01-29 DIAGNOSIS — N1330 Unspecified hydronephrosis: Secondary | ICD-10-CM | POA: Diagnosis not present

## 2018-01-29 DIAGNOSIS — Z79899 Other long term (current) drug therapy: Secondary | ICD-10-CM

## 2018-01-29 DIAGNOSIS — C61 Malignant neoplasm of prostate: Secondary | ICD-10-CM

## 2018-01-29 DIAGNOSIS — R5383 Other fatigue: Secondary | ICD-10-CM

## 2018-01-29 DIAGNOSIS — Z936 Other artificial openings of urinary tract status: Secondary | ICD-10-CM

## 2018-01-29 DIAGNOSIS — Z9221 Personal history of antineoplastic chemotherapy: Secondary | ICD-10-CM

## 2018-01-29 DIAGNOSIS — Z79818 Long term (current) use of other agents affecting estrogen receptors and estrogen levels: Secondary | ICD-10-CM

## 2018-01-29 DIAGNOSIS — C7951 Secondary malignant neoplasm of bone: Secondary | ICD-10-CM | POA: Diagnosis not present

## 2018-01-29 DIAGNOSIS — Z923 Personal history of irradiation: Secondary | ICD-10-CM | POA: Diagnosis not present

## 2018-01-29 DIAGNOSIS — R630 Anorexia: Secondary | ICD-10-CM | POA: Diagnosis not present

## 2018-01-29 DIAGNOSIS — R9721 Rising PSA following treatment for malignant neoplasm of prostate: Secondary | ICD-10-CM | POA: Diagnosis not present

## 2018-01-29 DIAGNOSIS — R197 Diarrhea, unspecified: Secondary | ICD-10-CM | POA: Diagnosis not present

## 2018-01-29 DIAGNOSIS — F418 Other specified anxiety disorders: Secondary | ICD-10-CM

## 2018-01-29 LAB — PROSTATE-SPECIFIC AG, SERUM (LABCORP): PROSTATE SPECIFIC AG, SERUM: 393 ng/mL — AB (ref 0.0–4.0)

## 2018-01-29 MED ORDER — MIRTAZAPINE 15 MG PO TABS: 15.0000 mg | ORAL_TABLET | Freq: Every day | ORAL | 1 refills | Status: DC

## 2018-01-29 NOTE — Progress Notes (Signed)
Hematology and Oncology Follow Up Visit  BECKET WECKER [id number] 1946/01/19 72 y.o. 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  He presented with Gleason score was 4+5 = 9 and a PSA 116 in 2016.  Prior Therapy:  Androgen deprivation therapy in addition to systemic chemotherapy in the form of Taxotere 75 mg/m every 3 weeks with plans for total of 6 cycles. Cycle 1 given on 05/05/2014. He is status post 6 cycles of therapy concluded on 08/20/2014.  He developed castration resistant disease in 2017.  Xtandi 160 mg daily started on 07/25/2015. Therapy discontinued in May 2018 because of progression of disease.  Jevtana chemotherapy started on 08/08/2016. He received 25 mg/m cycle 1 and then 20 mg/m in subsequent cycles.  Last cycle of chemotherapy given on 03/13/2017 for a total of 11 cycles.   Xofigo monthly injections started on June 17, 2017.  He is is status post 6 treatments concluded in August 2019.  He is status post nephrostomy tube placement bilaterally because of bilateral hydronephrosis.  This was completed in September 2019.  He is status post radiation to the prostate treated for a total of 30 Gy in 10 fractions between 10/7 and 01/10/2018.  Current therapy:  Androgen deprivation in the form of Lupron received every 4 months at 30 mg.  Last injection given on December 30, 2017 to be repeated in 4 months.     Interim History:  Mr. Purtee is here for a repeat evaluation.  Since last visit, he completed radiation therapy without any residual issues.  He did develop diarrhea that is currently resolving at this time.  His performance status remains marginal but has improved slightly.  He is able to drive short distances and has been going to his office about once or twice a week.  He does not report any worsening pain but does report occasional discomfort associated with his  nephrostomy tube.  He does have periodic issues with anxiety and depression.  His appetite is marginal although he of lost few pounds.  He reports limited quality of life however.  He does not report any headaches, blurry vision or syncope or seizures.  Does not report any changes in his mentation or confusion.  He does not report any chest pain, palpitation, orthopnea or leg edema. He does not report any cough, hemoptysis or hematemesis. He does not report any hematochezia, melena.  He denies any changes in his bowel habits.  He does not report any skin rashes or lesions.  He does not report any bleeding or clotting tendency. He denies any arthralgias or myalgias.  Remainder of his review of systems is negative.  Medications: I have reviewed the patient's current medications.   Current Outpatient Medications  Medication Sig Dispense Refill  . atorvastatin (LIPITOR) 40 MG tablet Take 1 tablet (40 mg total) by mouth daily at 6 PM. 90 tablet 3  . clopidogrel (PLAVIX) 75 MG tablet Take 1 tablet (75 mg total) by mouth daily. (Patient not taking: Reported on 12/18/2017)    . lidocaine-prilocaine (EMLA) cream Apply 1 application topically as needed. Apply to portacath on the day of chemotherapy. 30 g 3  . loratadine (CLARITIN) 10 MG tablet Take 10 mg by mouth daily as needed for allergies.    . megestrol (MEGACE) 40 MG/ML suspension TAKE 10 MLS (400 MG TOTAL) BY MOUTH 2 (TWO) TIMES DAILY. (Patient not taking: No sig reported) 240 mL  0  . ondansetron (ZOFRAN) 4 MG tablet Take 1 tablet (4 mg total) by mouth every 8 (eight) hours as needed for nausea or vomiting. 30 tablet 2  . polyethylene glycol (MIRALAX / GLYCOLAX) packet Take 17 g by mouth daily as needed for mild constipation. (Patient not taking: Reported on 12/18/2017) 14 each 0  . promethazine (PHENERGAN) 25 MG tablet Take 1 tablet (25 mg total) by mouth every 6 (six) hours as needed for nausea or vomiting. 60 tablet 3  . QUEtiapine (SEROQUEL) 25 MG  tablet Take 50 mg by mouth at bedtime.     No current facility-administered medications for this visit.    Facility-Administered Medications Ordered in Other Visits  Medication Dose Route Frequency Provider Last Rate Last Dose  . denosumab (XGEVA) injection 120 mg  120 mg Subcutaneous Once Wyatt Portela, MD         Allergies:  Allergies  Allergen Reactions  . Versed [Midazolam] Other (See Comments)    agitation    Past Medical History, Surgical history, Social history: Reviewed and remain unchanged.   Physical Exam:  Blood pressure 127/84, pulse 84, temperature 97.8 F (36.6 C), temperature source Oral, resp. rate 18, height 6' (1.829 m), weight 166 lb 6.4 oz (75.5 kg), SpO2 100 %.   ECOG: 2   General appearance: Comfortable appearing without any discomfort Head: Normocephalic without any trauma Oropharynx: Mucous membranes are moist and pink without any thrush or ulcers. Eyes: Pupils are equal and round reactive to light. Lymph nodes: No cervical, supraclavicular, inguinal or axillary lymphadenopathy.   Heart:regular rate and rhythm.  S1 and S2 without leg edema. Lung: Clear without any rhonchi or wheezes.  No dullness to percussion. Abdomin: Soft, nontender, nondistended with good bowel sounds.  No hepatosplenomegaly. Musculoskeletal: No joint deformity or effusion.  Full range of motion noted. Neurological: No deficits noted on motor, sensory and deep tendon reflex exam. Skin: No petechial rash or dryness.  Appeared moist.  .       Lab Results:  CBC    Component Value Date/Time   WBC 7.6 01/28/2018 1127   WBC 4.6 12/18/2017 1413   RBC 3.10 (L) 01/28/2018 1127   HGB 9.0 (L) 01/28/2018 1127   HGB 12.2 (L) 03/13/2017 0904   HCT 27.8 (L) 01/28/2018 1127   HCT 37.8 (L) 03/13/2017 0904   PLT 264 01/28/2018 1127   PLT 207 03/13/2017 0904   MCV 89.7 01/28/2018 1127   MCV 92.0 03/13/2017 0904   MCH 29.0 01/28/2018 1127   MCHC 32.4 01/28/2018 1127   RDW  15.9 (H) 01/28/2018 1127   RDW 15.4 (H) 03/13/2017 0904   LYMPHSABS 0.5 (L) 01/28/2018 1127   LYMPHSABS 1.2 03/13/2017 0904   MONOABS 0.8 01/28/2018 1127   MONOABS 0.4 03/13/2017 0904   EOSABS 0.0 01/28/2018 1127   EOSABS 0.0 03/13/2017 0904   BASOSABS 0.0 01/28/2018 1127   BASOSABS 0.0 03/13/2017 0904   '    Chemistry      Component Value Date/Time   NA 137 01/28/2018 1127   NA 142 03/13/2017 0904   K 4.1 01/28/2018 1127   K 4.4 03/13/2017 0904   CL 110 01/28/2018 1127   CO2 18 (L) 01/28/2018 1127   CO2 22 03/13/2017 0904   BUN 38 (H) 01/28/2018 1127   BUN 14.9 03/13/2017 0904   CREATININE 2.11 (H) 01/28/2018 1127   CREATININE 1.0 03/13/2017 0904      Component Value Date/Time   CALCIUM 9.2 01/28/2018 1127  CALCIUM 9.1 03/13/2017 0904   ALKPHOS 84 01/28/2018 1127   ALKPHOS 67 03/13/2017 0904   AST 11 (L) 01/28/2018 1127   AST 9 03/13/2017 0904   ALT 8 01/28/2018 1127   ALT 7 03/13/2017 0904   BILITOT <0.2 (L) 01/28/2018 1127   BILITOT 0.32 03/13/2017 0904      Results for Corpuz, BARAK BIALECKI "GENE" (MRN [id number]) as of 01/29/2018 11:51  Ref. Range 12/24/2017 10:17 01/28/2018 11:27  Prostate Specific Ag, Serum Latest Ref Range: 0.0 - 4.0 ng/mL 280.0 (H) 393.0 (H)      Impression and Plan:  72 year old man with  1.  Advanced prostate cancer with metastatic disease to the bone as well as pelvic mass.    His disease has been rapidly progressing with continuous overall decline in his overall health.  His PSA did show rapid increase in the last 4 weeks which have corresponded to overall increased fatigue and lethargy.  Options of therapy were reviewed today and there are very limited.  Recycling previous treatment which including retreatment with Taxotere or repeat a biopsy for foundation medicine testing versus supportive care on hospice.  Given his overall decline and limited performance status I am in favor of supportive care only with consideration for hospice in the  near future.  At this time we will continue with supportive management without any additional treatment.  I feel any additional treatment will compromise his quality of life and will probably not add any meaningful anticancer benefit.  We will continue to address this further in future visits.  2. Anorexia: He has used Megace with some improvement I will add Remeron at nighttime as well.  3. Androgen depravation: His next Lupron will be in 3 months.  4.  Bilateral hydronephrosis: Kidney function slightly elevated but overall stable.  He has bilateral nephrostomy tube in place.   5. IV access: Port-A-Cath remains in place and will be flushed periodically.  6.  Prognosis and goals of care: His prognosis is poor with limited life expectancy.  I feel that he has less than 6 months to live would qualify for hospice enrollment.  Role of hospice was discussed today.  And for the time being we will continue to address that with him in the near future.  He will consider that option as well.  I also urged him to make sure that he is living well and advanced directives which he does.  7. Follow-up: Will be in 8 weeks to follow his progress.   30  minutes was spent with the patient face-to-face today.  More than 50% of time was dedicated to discussing his disease status, treatment options, complications related to therapy, prognosis and end-of-life issues.  Quame Spratlin,MD 11/6/201911:57 AM

## 2018-01-29 NOTE — Telephone Encounter (Signed)
Printed avs and calender of upcoming appointment. Per 1/16 los 

## 2018-01-29 NOTE — Addendum Note (Signed)
Addended by: Wyatt Portela on: 01/29/2018 12:32 PM   Modules accepted: Orders

## 2018-01-30 ENCOUNTER — Encounter: Payer: Self-pay | Admitting: Urology

## 2018-01-30 ENCOUNTER — Ambulatory Visit (INDEPENDENT_AMBULATORY_CARE_PROVIDER_SITE_OTHER): Payer: Medicare Other | Admitting: Urology

## 2018-01-30 ENCOUNTER — Other Ambulatory Visit: Payer: Self-pay | Admitting: Radiology

## 2018-01-30 VITALS — BP 108/72 | HR 86 | Ht 72.0 in | Wt 163.2 lb

## 2018-01-30 DIAGNOSIS — D631 Anemia in chronic kidney disease: Secondary | ICD-10-CM | POA: Diagnosis not present

## 2018-01-30 DIAGNOSIS — C61 Malignant neoplasm of prostate: Secondary | ICD-10-CM

## 2018-01-30 DIAGNOSIS — N131 Hydronephrosis with ureteral stricture, not elsewhere classified: Secondary | ICD-10-CM

## 2018-01-30 DIAGNOSIS — N135 Crossing vessel and stricture of ureter without hydronephrosis: Secondary | ICD-10-CM

## 2018-01-30 DIAGNOSIS — N189 Chronic kidney disease, unspecified: Secondary | ICD-10-CM | POA: Diagnosis not present

## 2018-01-30 DIAGNOSIS — N138 Other obstructive and reflux uropathy: Secondary | ICD-10-CM | POA: Diagnosis not present

## 2018-01-30 DIAGNOSIS — N139 Obstructive and reflux uropathy, unspecified: Secondary | ICD-10-CM

## 2018-01-30 DIAGNOSIS — F0390 Unspecified dementia without behavioral disturbance: Secondary | ICD-10-CM | POA: Diagnosis not present

## 2018-01-30 DIAGNOSIS — Z436 Encounter for attention to other artificial openings of urinary tract: Secondary | ICD-10-CM | POA: Diagnosis not present

## 2018-01-31 ENCOUNTER — Ambulatory Visit
Admission: RE | Admit: 2018-01-31 | Discharge: 2018-01-31 | Disposition: A | Discharge status: Home / Self Care | Payer: Medicare Other | Source: Ambulatory Visit | Attending: Urology | Admitting: Urology

## 2018-01-31 DIAGNOSIS — N131 Hydronephrosis with ureteral stricture, not elsewhere classified: Secondary | ICD-10-CM | POA: Diagnosis not present

## 2018-01-31 DIAGNOSIS — Z436 Encounter for attention to other artificial openings of urinary tract: Secondary | ICD-10-CM | POA: Diagnosis not present

## 2018-01-31 DIAGNOSIS — C61 Malignant neoplasm of prostate: Secondary | ICD-10-CM | POA: Diagnosis not present

## 2018-01-31 DIAGNOSIS — N139 Obstructive and reflux uropathy, unspecified: Secondary | ICD-10-CM

## 2018-01-31 HISTORY — PX: IR NEPHROSTOMY EXCHANGE RIGHT: IMG6070

## 2018-01-31 HISTORY — PX: IR NEPHROSTOMY EXCHANGE LEFT: IMG6069

## 2018-01-31 MED ORDER — IOPAMIDOL (ISOVUE-300) INJECTION 61%
50.0000 mL | Freq: Once | INTRAVENOUS | Status: AC | PRN
  Administered 2018-01-31: 10 mL

## 2018-01-31 MED ORDER — LIDOCAINE HCL (PF) 1 % IJ SOLN
INTRAMUSCULAR | Status: AC
  Filled 2018-01-31: qty 30

## 2018-01-31 MED ORDER — LIDOCAINE HCL (PF) 1 % IJ SOLN
10.0000 mL | Freq: Once | INTRAMUSCULAR | Status: DC
  Filled 2018-01-31: qty 10

## 2018-01-31 NOTE — Procedures (Signed)
  Pre-operative Diagnosis: Metastatic prostate cancer with bilateral nephrostomy tubes        Post-operative Diagnosis: Metastatic prostate cancer with bilateral nephrostomy tubes   Indications: Routine exchange of nephrostomy tubes  Procedure: Bilateral nephrostomy tube exchanges  Findings: 10.2 Fr drain in renal pelvis, bilaterally  Complications: None     EBL: None  Plan: Anticipated routine exchange in 2-3 months.

## 2018-01-31 NOTE — Discharge Instructions (Signed)
Percutaneous Nephrostomy Home Guide Percutaneous nephrostomy is a procedure to insert a flexible tube into your kidney so that urine can leave your body. This procedure may be done if a medical condition prevents urine from leaving your kidney in the usual way. During the procedure, the nephrostomy tube is inserted in the right or left side of your lower back and is connected to an external drainage bag. After you have a nephrostomy tube placed, urine will collect in the drainage bag outside of your body. You will need to empty and change the drainage bag as needed. You will also need to take steps to care for the area where the nephrostomy tube was inserted (tube insertion site). How do I care for my nephrostomy tube?  Always keep your tubing, the leg bag, or the bedside drainage bag below the level of your kidney so that your urine drains freely.  Avoid activities that would cause bending or pulling of your tubing. Ask your health care provider what activities are safe for you.  When connecting your nephrostomy tube to a drainage bag, make sure that there are no kinks in the tubing and that your urine is draining freely. You may want to gently wrap an elastic bandage over the tubing. This will help keep the tubing in place and prevent it from kinking. Make sure there is no tension on the tubing so it does not become dislodged.  At night, you may want to connect your nephrostomy tube or the leg bag to a larger bedside drainage bag. How do I empty the drainage bag? Empty the leg bag or bedside drainage bag whenever it becomes ? full. Also empty it before you go to sleep. Most drainage bags have a drain at the bottom that allows urine to be emptied. Follow these basic steps: 1. Hold the drainage bag over a toilet or collection container. Use a measuring container if your health care provider told you to measure your urine. 2. Open the drain of the bag and allow the urine to drain out. 3. After all the  urine has drained from the drainage bag, close the drain fully. 4. Flush the urine down the toilet. If a collection container was used, rinse the container.  How do I change the dressing around the nephrostomy tube? Change your dressing and clean your tube exit site as told by your health care provider. You may need to change the dressing every day for the first 2 weeks after having a nephrostomy tube inserted. After the first 2 weeks, you may be told to change the dressing two times a week. Supplies needed:  Mild soap and water.  Split gauze pads, 4  4 inches (10 x 10 cm).  Gauze pads, 4  4 inches (10 x 10 cm).  Paper tape. How to change the dressing: Because of the location of your nephrostomy tube, you may need help from another person to complete dressing changes. Follow these basic steps: 1. Wash hands with soap and water. 2. Gently remove the tape and dressing from around the nephrostomy tube. Be careful not to pull on the tube while removing the dressing. Avoid using scissors because they may damage the tube. 3. Wash the skin around the tube with mild soap and water, rinse well, and pat the skin dry with a clean cloth. 4. Check the skin around the drain for redness, swelling, pus, warmth, or a bad smell. 5. If the drain was sutured to the skin, check the suture to  verify that it is still anchored in the skin. 6. Place two split gauze pads in and around the tube exit site. Do not apply ointments or alcohol to the site. 7. Place a gauze pad on top of the split gauze pad. 8. Coil the tube on top of the gauze. The tubing should rest on the gauze, not on the skin. 9. Place tape around each edge of the gauze pad. 10. Secure the nephrostomy tubing. Make sure that the tube does not kink or become pinched. The tubing should rest on the gauze pad, not on the skin. 11. Dispose of used supplies properly.  How do I flush my nephrostomy tube? Use a saline syringe to rinse out (flush) your  nephrostomy tube as told by your health care provider. Flushing is easier if a three-way stopcock is placed between the tube and the drainage bag. One connection of the stopcock connects to your tube, the second connects to the drainage bag, and the third is usually covered with a cap. The lever on the stopcock points to the direction on the stopcock that is closed to flow. Normally, the lever points in the direction of the cap to allow urine to drain from the tube to the drainage bag. Supplies needed:  Rubbing alcohol wipe.  10 mL 0.9% saline syringe. How to flush the tube: 1. Move the lever of the three-way stopcock so it points toward the drainage bag. 2. Clean the cap with a rubbing alcohol wipe. 3. Screw the tip of a 10 mL 0.9% saline syringe onto the cap. 4. Using the syringe plunger, slowly push the 10 mL 0.9% saline in the syringe over 5-10 seconds. If resistance is met or pain occurs while pushing, stop pushing the saline. 5. Remove the syringe from the cap. 6. Return the stopcock lever to the usual position, pointing in the direction of the cap. 7. Dispose of used supplies properly. How do I replace the drainage bag? Replace the drainage bag, three-way stopcock, and any extension tubing as told by your health care provider. Make sure you always have an extra drainage bag and connecting tubing available. 1. Empty urine from your drainage bag. 2. Gather a new drainage bag, three-way stopcock, and any extension tubing. 3. Remove the drainage bag, three-way stopcock, and any extension tubing from the nephrostomy tube. 4. Attach the new leg bag or bedside drainage bag, three-way stopcock, and any extension tubing to the nephrostomy tube. 5. Dispose of the used drainage bag, three-way stopcock, and any extension.  Contact a health care provider if:  You have problems with any of the valves or tubing.  You have persistent pain or soreness in your back.  You have more redness, swelling,  or pain around your tube insertion site.  You have more fluid or blood coming from your tube insertion site.  Your tube insertion site feels warm to the touch.  You have pus or a bad smell coming from your tube insertion site.  You have increased urine output or you feel burning when urinating. Get help right away if:  You have pain in your abdomen during the first week.  You have chest pain or have trouble breathing.  You have a new appearance of blood in your urine.  You have a fever or chills.  You have back pain that is not relieved by your medicine.  You have decreased urine output.  Your nephrostomy tube comes out. This information is not intended to replace advice given to  you by your health care provider. Make sure you discuss any questions you have with your health care provider. Document Released: 12/31/2012 Document Revised: 12/23/2015 Document Reviewed: 12/23/2015 Elsevier Interactive Patient Education  2018 [place]. Percutaneous Nephrostomy, Care After This sheet gives you information about how to care for yourself after your procedure. Your health care provider may also give you more specific instructions. If you have problems or questions, contact your health care provider. What can I expect after the procedure? After the procedure, it is common to have:  Some soreness where the nephrostomy tube was inserted (tube insertion site).  Blood-tinged drainage from the nephrostomy tube for the first 24 hours.  Follow these instructions at home: Activity  Return to your normal activities as told by your health care provider. Ask your health care provider what activities are safe for you.  Avoid activities that may cause the nephrostomy tubing to bend.  Do not take baths, swim, or use a hot tub until your health care provider approves. Ask your health care provider if you can take showers. Cover the nephrostomy tube dressing with a watertight covering when you  take a shower.  Donot drive for 24 hours if you were given a medicine to help you relax (sedative). Care of the tube insertion site  Follow instructions from your health care provider about how to take care of your tube insertion site. Make sure you: ? Wash your hands with soap and water before you change your bandage (dressing). If soap and water are not available, use hand sanitizer. ? Change your dressing as told by your health care provider. Be careful not to pull on the tube while removing the dressing. ? When you change the dressing, wash the skin around the tube, rinse well, and pat the skin dry.  Check the tube insertion area every day for signs of infection. Check for: ? More redness, swelling, or pain. ? More fluid or blood. ? Warmth. ? Pus or a bad smell. Care of the nephrostomy tube and drainage bag  Always keep the tubing, the leg bag, or the bedside drainage bags below the level of the kidney so that your urine drains freely.  When connecting your nephrostomy tube to a drainage bag, make sure that there are no kinks in the tubing and that your urine is draining freely. You may want to use an elastic bandage to wrap any exposed tubing that goes from the nephrostomy tube to any of the connecting tubes.  At night, you may want to connect your nephrostomy tube or the leg bag to a larger bedside drainage bag.  Follow instructions from your health care provider about how to empty or change the drainage bag.  Empty the drainage bag when it becomes ? full.  Replace the drainage bag and any extension tubing that is connected to your nephrostomy tube every 3 weeks or as often as told by your health care provider. Your health care provider will explain how to change the drainage bag and extension tubing. General instructions  Take over-the-counter and prescription medicines only as told by your health care provider.  Keep all follow-up visits as told by your health care provider.  This is important. Contact a health care provider if:  You have problems with any of the valves or tubing.  You have persistent pain or soreness in your back.  You have more redness, swelling, or pain around your tube insertion site.  You have more fluid or blood coming from  your tube insertion site.  Your tube insertion site feels warm to the touch.  You have pus or a bad smell coming from your tube insertion site.  You have increased urine output or you feel burning when urinating. Get help right away if:  You have pain in your abdomen during the first week.  You have chest pain or have trouble breathing.  You have a new appearance of blood in your urine.  You have a fever or chills.  You have back pain that is not relieved by your medicine.  You have decreased urine output.  Your nephrostomy tube comes out. This information is not intended to replace advice given to you by your health care provider. Make sure you discuss any questions you have with your health care provider. Document Released: 11/03/2003 Document Revised: 12/23/2015 Document Reviewed: 12/23/2015 Elsevier Interactive Patient Education  [employer].

## 2018-01-31 NOTE — Progress Notes (Signed)
01/30/2018 8:23 PM   [name] 1946-01-26 [id number]  Referring provider: [name], MD [address] [place], [place] [address]  Chief Complaint  Patient presents with  . Prostate Cancer   Urologic history: 1.  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  CT with pelvic and retroperitoneal adenopathy.  Bone scan negative. 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  He initially presented and 2016 with a PSA of 116 and biopsy positive Gleason 4+5 disease.  He is on ADT and has previously been treated with Taxotere in 2016.  He developed castrate resistant disease in 2017.  He was started on Xtandi in 2017 which was discontinued in 2018 due to progression of disease.  Jevtana chemotherapy started May 2018 and completed in December 2018.  He also received Xofigo between March 2019 and August 2019. He was admitted to [facility] and September 2019 and had bilateral percutaneous nephrostomy tubes placed for bilateral ureteral obstruction.  He is followed by medical oncology at [facility] in [place].  He was seen Dr. [name] as his urologist in [place] however has elected to transfer urologic care closer to home.  He saw Dr. [name] earlier this week and was felt to have a life expectancy of less than 6 months and they discussed hospice care.  Nephrostomy tubes have not been changed since they were placed in September.  He has been oliguric since his neph tubes placed.  PMH: Past Medical History:  Diagnosis Date  . Diverticulosis of colon   . Hx of colonic polyps   .  Prostate cancer [facility])     Surgical History: Past Surgical History:  Procedure Laterality Date  . CARDIOVASCULAR STRESS TEST  1996   Negative  . IR FLUORO GUIDE PORT INSERTION RIGHT  01/09/2017  . IR FLUORO RM 30-60 MIN  12/12/2017  . IR NEPHROSTOGRAM LEFT INITIAL PLACEMENT  11/23/2017  . IR NEPHROSTOMY EXCHANGE LEFT  01/31/2018  . IR NEPHROSTOMY EXCHANGE RIGHT  01/31/2018  . IR NEPHROSTOMY PLACEMENT RIGHT  12/18/2017  . IR US GUIDE VASC ACCESS RIGHT  01/09/2017  . PORTACATH PLACEMENT      Home Medications:  Allergies as of 01/30/2018      Reactions   Versed [midazolam] Other (See Comments)   agitation      Medication List        Accurate as of 01/30/18 11:59 PM. Always use your most recent med list.          atorvastatin 40 MG tablet Commonly known as:  LIPITOR Take 1 tablet (40 mg total) by mouth daily at 6 PM.   diphenhydramine-acetaminophen 25-500 MG Tabs tablet Commonly known as:  TYLENOL PM Take 2 tablets by mouth at bedtime as needed.   lidocaine-prilocaine cream Commonly known as:  EMLA Apply 1 application topically as needed. Apply to portacath on the day of chemotherapy.   loratadine 10 MG tablet Commonly known as:  CLARITIN Take 10 mg by mouth daily as needed for allergies.   megestrol 40 MG/ML suspension Commonly known as:  MEGACE TAKE 10 MLS (400 MG TOTAL) BY MOUTH 2 (TWO) TIMES DAILY.   mirtazapine 15 MG tablet Commonly known as:  REMERON Take 1 tablet (15 mg total) by mouth at bedtime.   ondansetron 4 MG tablet Commonly known as:  ZOFRAN Take 1 tablet (4 mg total) by mouth every 8 (eight) hours as needed for nausea or vomiting.   polyethylene glycol packet Commonly known as:  MIRALAX / GLYCOLAX Take 17 g by mouth daily as needed for mild constipation.   promethazine 25 MG tablet Commonly known as:  PHENERGAN Take 1 tablet (25 mg total) by mouth every 6 (six) hours as needed for nausea or vomiting.       Allergies:  Allergies  Allergen  Reactions  . Versed [Midazolam] Other (See Comments)    agitation    Family History: Family History  Problem Relation Age of Onset  . Heart attack Father   . Heart disease Father   . Heart attack Brother 78  . Heart disease Brother   . Heart attack Brother   . Heart disease Brother   . Cancer Sister        breast  . Hypertension Mother   . Diabetes Sister   . Cancer Maternal Aunt   . Cancer Maternal Aunt   . Asthma Son     Social History:  reports that he quit smoking about 27 years ago. His smoking use included cigarettes. He has a 10.00 pack-year smoking history. He has never used smokeless tobacco. He reports that he does not drink alcohol or use drugs.  ROS: UROLOGY Frequent Urination?: No Hard to postpone urination?: No Burning/pain with urination?: No Get up at night to urinate?: No Leakage of urine?: No Urine stream starts and stops?: No Trouble starting stream?: No Do you have to strain to urinate?: No Blood in urine?: No Urinary tract infection?: No Sexually transmitted disease?: No Injury to kidneys or bladder?: No Painful intercourse?: No Weak stream?: No Erection problems?: No Penile pain?: No  Gastrointestinal Nausea?: No Vomiting?: No Indigestion/heartburn?: Yes Diarrhea?: No Constipation?: No  Constitutional Fever: No Night sweats?: No Weight loss?: Yes Fatigue?: Yes  Skin Skin rash/lesions?: No Itching?: No  Eyes Blurred vision?: No Double vision?: No  Ears/Nose/Throat Sore throat?: No Sinus problems?: No  Hematologic/Lymphatic Swollen glands?: No Easy bruising?: No  Cardiovascular Leg swelling?: Yes Chest pain?: No  Respiratory Cough?: Yes Shortness of breath?: Yes  Endocrine Excessive thirst?: Yes  Musculoskeletal Back pain?: Yes Joint pain?: No  Neurological Headaches?: No Dizziness?: No  Psychologic Depression?: No Anxiety?: No  Physical Exam: BP 108/72 (BP Location: Left Arm, Patient Position:  Sitting, Cuff Size: Normal)   Pulse 86   Ht 6' (1.829 m)   Wt 163 lb 3.2 oz (74 kg)   BMI 22.13 kg/m   Constitutional:  Alert and oriented, No acute distress. HEENT: [place] AT, moist mucus membranes.  Trachea midline, no masses. Cardiovascular: No clubbing, cyanosis, or edema. Respiratory: Normal respiratory effort, no increased work of breathing. GI: Abdomen is soft, nontender, nondistended, no abdominal masses GU: No CVA tenderness Lymph: No cervical or inguinal lymphadenopathy. Skin: No rashes, bruises or suspicious lesions. Neurologic: Grossly intact, no focal deficits, moving all 4 extremities. Psychiatric: Normal mood and affect.   Assessment & Plan:   72 year old male with castrate resistant metastatic prostate cancer.  He is followed regularly in medical oncology.  Nephrostomy tube change will be scheduled through interventional radiology.  He will continue his medical oncology follow-ups and return here as needed.  Would recommend nephrostomy tube changes every 6-8 weeks.   Abbie Sons, [facility]  [address], [place], [place] [address] [phone]

## 2018-02-05 ENCOUNTER — Encounter: Payer: Self-pay | Admitting: Internal Medicine

## 2018-02-05 ENCOUNTER — Ambulatory Visit: Payer: Medicare Other | Admitting: Internal Medicine

## 2018-02-05 VITALS — BP 112/68 | HR 72 | Resp 12

## 2018-02-05 DIAGNOSIS — I639 Cerebral infarction, unspecified: Secondary | ICD-10-CM

## 2018-02-05 DIAGNOSIS — C61 Malignant neoplasm of prostate: Secondary | ICD-10-CM

## 2018-02-05 DIAGNOSIS — M545 Low back pain, unspecified: Secondary | ICD-10-CM

## 2018-02-05 DIAGNOSIS — E440 Moderate protein-calorie malnutrition: Secondary | ICD-10-CM

## 2018-02-05 DIAGNOSIS — I5022 Chronic systolic (congestive) heart failure: Secondary | ICD-10-CM | POA: Diagnosis not present

## 2018-02-05 DIAGNOSIS — D631 Anemia in chronic kidney disease: Secondary | ICD-10-CM | POA: Diagnosis not present

## 2018-02-05 DIAGNOSIS — N189 Chronic kidney disease, unspecified: Secondary | ICD-10-CM | POA: Diagnosis not present

## 2018-02-05 DIAGNOSIS — N138 Other obstructive and reflux uropathy: Secondary | ICD-10-CM | POA: Diagnosis not present

## 2018-02-05 DIAGNOSIS — N139 Obstructive and reflux uropathy, unspecified: Secondary | ICD-10-CM

## 2018-02-05 DIAGNOSIS — F0390 Unspecified dementia without behavioral disturbance: Secondary | ICD-10-CM | POA: Diagnosis not present

## 2018-02-05 DIAGNOSIS — Z436 Encounter for attention to other artificial openings of urinary tract: Secondary | ICD-10-CM | POA: Diagnosis not present

## 2018-02-05 MED ORDER — SODIUM CHLORIDE 0.9 % IJ SOLN: 3.0000 mL | INTRAMUSCULAR | 5 refills | Status: DC | PRN

## 2018-02-05 MED ORDER — TRAMADOL HCL 50 MG PO TABS: 50.0000 mg | ORAL_TABLET | Freq: Four times a day (QID) | ORAL | 0 refills | Status: DC | PRN

## 2018-02-05 NOTE — Assessment & Plan Note (Signed)
Has finished with active Rx Discussed palliation and hospice care He would like to think about the hospice Discussed DNR---he would also like to think about this (I recommended both) Will add tramadol for prn for pain control---would change to hydrocodone if not tolerated or not effective

## 2018-02-05 NOTE — Assessment & Plan Note (Signed)
Now on mirtazapine Discussed stopping the megace Uses boost supplements

## 2018-02-05 NOTE — Assessment & Plan Note (Signed)
From mets and likely muscular Will step up Rx as needed

## 2018-02-05 NOTE — Progress Notes (Signed)
Subjective:    Patient ID: [name], male    DOB: [birth year], 72 y.o.   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 atorvastatin (LIPITOR) 40 MG tablet Take 1 tablet (40 mg total) by mouth daily at 6 PM. 90 tablet 3  . lidocaine-prilocaine (EMLA) cream Apply 1 application topically as needed. Apply to portacath on the day of chemotherapy. 30 g 3  . loratadine (CLARITIN) 10 MG tablet Take 10 mg by mouth daily as needed for allergies.    . megestrol (MEGACE) 40 MG/ML suspension TAKE 10 MLS (400 MG TOTAL) BY MOUTH 2 (TWO) TIMES DAILY. 240 mL 0  . mirtazapine (REMERON) 15 MG tablet Take 1 tablet (15 mg total) by mouth at bedtime. 30 tablet 1  . ondansetron (ZOFRAN) 4 MG tablet Take 1 tablet (4 mg total) by mouth every 8 (eight) hours as needed for nausea or vomiting. 30 tablet 2  . polyethylene glycol (MIRALAX / GLYCOLAX) packet Take 17 g by mouth daily as needed for mild constipation. 14 each 0  . promethazine (PHENERGAN) 25 MG tablet Take 1 tablet (25 mg total) by mouth every 6 (six) hours as needed for nausea or vomiting. 60 tablet 3   Current Facility-Administered Medications on File Prior to Visit  Medication Dose Route Frequency  Provider Last Rate Last Dose  . denosumab (XGEVA) injection 120 mg  120 mg Subcutaneous Once Wyatt Portela, MD        Allergies  Allergen Reactions  . Versed [Midazolam] Other (See Comments)    agitation    Past Medical History:  Diagnosis Date  . Diverticulosis of colon   . Hx of colonic polyps   . Prostate cancer [facility])     Past Surgical History:  Procedure Laterality Date  . CARDIOVASCULAR STRESS TEST  1996   Negative  . IR FLUORO GUIDE PORT INSERTION RIGHT  01/09/2017  . IR FLUORO RM 30-60 MIN  12/12/2017  . IR NEPHROSTOGRAM LEFT INITIAL PLACEMENT  11/23/2017  . IR NEPHROSTOMY EXCHANGE LEFT  01/31/2018  . IR NEPHROSTOMY EXCHANGE RIGHT  01/31/2018  . IR NEPHROSTOMY PLACEMENT RIGHT  12/18/2017  . IR US GUIDE VASC ACCESS RIGHT  01/09/2017  . PORTACATH PLACEMENT      Family History  Problem Relation Age of Onset  . Heart attack Father   . Heart disease Father   . Heart attack Brother 86  . Heart disease Brother   . Heart attack Brother   . Heart disease Brother   . Cancer Sister        breast  . Hypertension Mother   . Diabetes Sister   . Cancer Maternal Aunt   . Cancer Maternal Aunt   . Asthma Son  Social History   Socioeconomic History  . Marital status: Married    Spouse name: Not on file  . Number of children: 3  . Years of education: Not on file  . Highest education level: Not on file  Occupational History  . Occupation: cancer registry reporting  Social Needs  . Financial resource strain: Not on file  . Food insecurity:    Worry: Not on file    Inability: Not on file  . Transportation needs:    Medical: Not on file    Non-medical: Not on file  Tobacco Use  . Smoking status: Former Smoker    Packs/day: 1.00    Years: 10.00    Pack years: 10.00    Types: Cigarettes    Last attempt to quit: 03/26/1990    Years since quitting: 27.8  . Smokeless tobacco: Never Used  Substance and Sexual Activity  . Alcohol use: No    Frequency: Never  . Drug  use: No  . Sexual activity: Never  Lifestyle  . Physical activity:    Days per week: Not on file    Minutes per session: Not on file  . Stress: Not on file  Relationships  . Social connections:    Talks on phone: Not on file    Gets together: Not on file    Attends religious service: Not on file    Active member of club or organization: Not on file    Attends meetings of clubs or organizations: Not on file    Relationship status: Not on file  . Intimate partner violence:    Fear of current or ex partner: Not on file    Emotionally abused: Not on file    Physically abused: Not on file    Forced sexual activity: Not on file  Other Topics Concern  . Not on file  Social History Narrative   Has living will   Wife is health care POA.   Would accept resuscitation    No tube feeds if cognitively unaware.   Review of Systems No other joint pain Gets DOE---like after shower (but still independent with this) Still has RN and aide from [place] care----needs nephrostomy tubes wrapped, etc before Bowels have been okay No SOB at rest No rash or skin problems    Objective:   Physical Exam  Constitutional: No distress.  Clear wasting  Neck: No thyromegaly present.  Cardiovascular: Normal rate, regular rhythm and normal heart sounds. Exam reveals no gallop.  No murmur heard. Respiratory: Effort normal and breath sounds normal. No respiratory distress. He has no wheezes. He has no rales.  GI: Soft. There is no tenderness.  Musculoskeletal: He exhibits no edema.  Nephrostomy tubes in place and draining  Lymphadenopathy:    He has no cervical adenopathy.  Skin: No rash noted.  Psychiatric:  Mood is neutral--though slightly tearful with some of our discussion           Assessment & Plan:

## 2018-02-05 NOTE — Assessment & Plan Note (Signed)
Nephrostomy tubes in place Will continue with [facility] for care (unless he decides on hospice)

## 2018-02-05 NOTE — Assessment & Plan Note (Signed)
This does not appear to be active now No edema or pulmonary findings---just has some DOE which is likely multifactorial

## 2018-02-06 ENCOUNTER — Other Ambulatory Visit: Payer: Self-pay | Admitting: Radiology

## 2018-02-06 DIAGNOSIS — N131 Hydronephrosis with ureteral stricture, not elsewhere classified: Secondary | ICD-10-CM

## 2018-02-07 DIAGNOSIS — C61 Malignant neoplasm of prostate: Secondary | ICD-10-CM | POA: Diagnosis not present

## 2018-02-07 DIAGNOSIS — N189 Chronic kidney disease, unspecified: Secondary | ICD-10-CM | POA: Diagnosis not present

## 2018-02-07 DIAGNOSIS — Z436 Encounter for attention to other artificial openings of urinary tract: Secondary | ICD-10-CM | POA: Diagnosis not present

## 2018-02-07 DIAGNOSIS — N138 Other obstructive and reflux uropathy: Secondary | ICD-10-CM | POA: Diagnosis not present

## 2018-02-07 DIAGNOSIS — F0390 Unspecified dementia without behavioral disturbance: Secondary | ICD-10-CM | POA: Diagnosis not present

## 2018-02-07 DIAGNOSIS — D631 Anemia in chronic kidney disease: Secondary | ICD-10-CM | POA: Diagnosis not present

## 2018-02-09 ENCOUNTER — Encounter: Payer: Self-pay | Admitting: Urology

## 2018-02-09 DIAGNOSIS — N135 Crossing vessel and stricture of ureter without hydronephrosis: Secondary | ICD-10-CM | POA: Insufficient documentation

## 2018-02-10 DIAGNOSIS — D631 Anemia in chronic kidney disease: Secondary | ICD-10-CM | POA: Diagnosis not present

## 2018-02-10 DIAGNOSIS — C61 Malignant neoplasm of prostate: Secondary | ICD-10-CM | POA: Diagnosis not present

## 2018-02-10 DIAGNOSIS — Z436 Encounter for attention to other artificial openings of urinary tract: Secondary | ICD-10-CM | POA: Diagnosis not present

## 2018-02-10 DIAGNOSIS — C7951 Secondary malignant neoplasm of bone: Secondary | ICD-10-CM | POA: Diagnosis not present

## 2018-02-10 DIAGNOSIS — G9341 Metabolic encephalopathy: Secondary | ICD-10-CM | POA: Diagnosis not present

## 2018-02-10 DIAGNOSIS — Z79899 Other long term (current) drug therapy: Secondary | ICD-10-CM | POA: Diagnosis not present

## 2018-02-10 DIAGNOSIS — N189 Chronic kidney disease, unspecified: Secondary | ICD-10-CM | POA: Diagnosis not present

## 2018-02-10 DIAGNOSIS — E43 Unspecified severe protein-calorie malnutrition: Secondary | ICD-10-CM | POA: Diagnosis not present

## 2018-02-10 DIAGNOSIS — Z87891 Personal history of nicotine dependence: Secondary | ICD-10-CM | POA: Diagnosis not present

## 2018-02-10 DIAGNOSIS — I700 Atherosclerosis of aorta: Secondary | ICD-10-CM | POA: Diagnosis not present

## 2018-02-10 DIAGNOSIS — F0390 Unspecified dementia without behavioral disturbance: Secondary | ICD-10-CM | POA: Diagnosis not present

## 2018-02-10 DIAGNOSIS — N138 Other obstructive and reflux uropathy: Secondary | ICD-10-CM | POA: Diagnosis not present

## 2018-02-10 DIAGNOSIS — Z7982 Long term (current) use of aspirin: Secondary | ICD-10-CM | POA: Diagnosis not present

## 2018-02-11 ENCOUNTER — Encounter: Payer: Self-pay | Admitting: Urology

## 2018-02-11 ENCOUNTER — Ambulatory Visit: Admission: RE | Admit: 2018-02-11 | Payer: Medicare Other | Source: Ambulatory Visit | Admitting: Urology

## 2018-02-11 NOTE — Progress Notes (Signed)
Per conversation between [place], Therapist, sports and patient's wife, [name] will not be coming in for his routine follow up appointment since he is no longer pursuing active treatment.  She reports that he tolerated his recent palliative course of XRT to the prostate mass and is not currently having any urinary or bowel issues. He will continue in routine follow up with Dr. [name] and will likley transition to Hospice care in the near future.   Nicholos Johns, MMS, PA-C [place] [facility] [place]: [phone]  Fax: [phone]

## 2018-02-12 ENCOUNTER — Telehealth: Payer: Self-pay | Admitting: Internal Medicine

## 2018-02-12 NOTE — Telephone Encounter (Signed)
okay

## 2018-02-12 NOTE — Telephone Encounter (Signed)
Verbal orders given to [facility]

## 2018-02-12 NOTE — Telephone Encounter (Signed)
Needing verbal orders for nursing    1wk 4 with 2 as needed visits

## 2018-02-13 ENCOUNTER — Telehealth: Payer: Self-pay | Admitting: Radiology

## 2018-02-13 ENCOUNTER — Other Ambulatory Visit: Payer: Self-pay | Admitting: *Deleted

## 2018-02-13 ENCOUNTER — Encounter: Payer: Self-pay | Admitting: *Deleted

## 2018-02-13 NOTE — Telephone Encounter (Signed)
Informed wife, Quita Skye, of bilateral nephrostomy tube exchanges scheduled 03/05/2018 at 12:30. Questions answered. Wife expresses understanding of conversation.

## 2018-02-13 NOTE — Patient Outreach (Signed)
[facility]) Care Management [place] Telephone Outreach Case Closure  02/13/2018  TABIUS ROOD [birth year] [id number]  Successfultelephone outreachattempttospouse/ caregiver, Tyner Codner [phone]) ofJames "Gene" Brisky, 72 y/o male referred to [place] by [facility] telephonic RN CM; original referral a result of [facility] care team,after patient experiencedrecent hospitalization August 31- December 02, 2017 for acute CVA/ hematuria/ hydronephrosis secondary to known prostate cancer; during hospitalization, nephrostomy tube placed.Patientwas discharged from hospital to SNF for short-term rehabilitation;has history including, but not limited to, CKD; diverticulosis; prostate cancer; anemia; sCHF; and malnutrition. HIPAA/ identity verified with patient'sspouse today.  Today,spousereports that patient "is doing okay."  Reports patient visited with oncology and PCP providers recently and was told that he should not have additional treatment for chemotherapy/ radiation; spouse reports patient was given "less then 6 months to live." Spouse further reports that she and patient are considering hospice care, and for now have ongoing home health services, which was extended just this week.  Confirmed with spouse that she and patient understand how to initiate hospice, and she confirms that she and patient have discussed with Dr. Silvio Pate, and that they understand to contact him when they become ready to have hospice care initiated.  Emotional support provided to spouse throughout phone call today.  Spousedenies further issues, concerns, or problems today, and she denies additional care coordination needs; we discussed [place] CM case closure, and spouse agrees with case closure, stating that everything is now in place for patient. Iconfirmed that patient hasmy direct phone number, the main [facility] CM office phone number, and the [facility] CM 24-hour nurse advice  phone number should issues arise in the future.  Plan:  Will close [place] CM case and make patient's PCP aware of same.  [facility] CM Care Plan Problem One     Most Recent Value  Care Plan Problem One  Uncertainty related to long term plan of care in patient with prostate cancer, as evidenced by patient and caregiver reporting   Role Documenting the Problem One  Care Management [place] for Problem One  Not Active  [facility] Long Term Goal   Over the next 60 days, patient/ caregiver will verbalize plan of care for patient at home, as evidenced by patient/ caregiver reporting during [place] outreach  [place] Term Goal Start Date  12/30/17  [facility] Long Term Goal Met Date  02/13/18 [facility] met]  Interventions for Problem One Long Term Goal  Discussed with patient's spouse/ caregiver plan for ongoing home health services vs. hospice care,  confirmed that spouse/ patient are consdering their options around hospice care and that they are aware of how to initiate hospice care     It has been a pleasure caring for Mr. Civil,  [name], RN, BSN, [employer] Coordinator [facility] Care Management  [phone]

## 2018-02-14 DIAGNOSIS — N189 Chronic kidney disease, unspecified: Secondary | ICD-10-CM | POA: Diagnosis not present

## 2018-02-14 DIAGNOSIS — Z436 Encounter for attention to other artificial openings of urinary tract: Secondary | ICD-10-CM | POA: Diagnosis not present

## 2018-02-14 DIAGNOSIS — F0390 Unspecified dementia without behavioral disturbance: Secondary | ICD-10-CM | POA: Diagnosis not present

## 2018-02-14 DIAGNOSIS — N138 Other obstructive and reflux uropathy: Secondary | ICD-10-CM | POA: Diagnosis not present

## 2018-02-14 DIAGNOSIS — C61 Malignant neoplasm of prostate: Secondary | ICD-10-CM | POA: Diagnosis not present

## 2018-02-14 DIAGNOSIS — D631 Anemia in chronic kidney disease: Secondary | ICD-10-CM | POA: Diagnosis not present

## 2018-02-19 ENCOUNTER — Other Ambulatory Visit: Payer: Medicare Other

## 2018-02-20 ENCOUNTER — Other Ambulatory Visit: Payer: Self-pay | Admitting: Oncology

## 2018-02-24 ENCOUNTER — Telehealth: Payer: Self-pay

## 2018-02-24 DIAGNOSIS — N138 Other obstructive and reflux uropathy: Secondary | ICD-10-CM | POA: Diagnosis not present

## 2018-02-24 DIAGNOSIS — F0390 Unspecified dementia without behavioral disturbance: Secondary | ICD-10-CM | POA: Diagnosis not present

## 2018-02-24 DIAGNOSIS — N189 Chronic kidney disease, unspecified: Secondary | ICD-10-CM | POA: Diagnosis not present

## 2018-02-24 DIAGNOSIS — Z436 Encounter for attention to other artificial openings of urinary tract: Secondary | ICD-10-CM | POA: Diagnosis not present

## 2018-02-24 DIAGNOSIS — C61 Malignant neoplasm of prostate: Secondary | ICD-10-CM | POA: Diagnosis not present

## 2018-02-24 DIAGNOSIS — D631 Anemia in chronic kidney disease: Secondary | ICD-10-CM | POA: Diagnosis not present

## 2018-02-24 NOTE — Telephone Encounter (Signed)
Daughter called on call on Friday reporting that her father is now very agitated after stopping the mirtazapine and tramadol medications over the past week.  She is asking for advice.  On call physician contacted and advised if aggressive or danger to self or others they will need to seek immediate care; otherwise if stable monitor and see pcp on Monday.

## 2018-02-24 NOTE — Telephone Encounter (Signed)
Spoke with patient's daughter to follow up.  She had been in communication with Dr. Silvio Pate through my chart.    Today she states that her father appears to be leveling off in terms of agitation.  He still is not sleeping very well but overall things are better and she will let us know if they need anything further.

## 2018-02-24 NOTE — Telephone Encounter (Signed)
Okay We have been going back and forth on MyChart which is working for both of [place]

## 2018-02-25 ENCOUNTER — Telehealth: Payer: Self-pay | Admitting: Urology

## 2018-02-25 DIAGNOSIS — Z436 Encounter for attention to other artificial openings of urinary tract: Secondary | ICD-10-CM | POA: Diagnosis not present

## 2018-02-25 DIAGNOSIS — N138 Other obstructive and reflux uropathy: Secondary | ICD-10-CM | POA: Diagnosis not present

## 2018-02-25 DIAGNOSIS — F0390 Unspecified dementia without behavioral disturbance: Secondary | ICD-10-CM | POA: Diagnosis not present

## 2018-02-25 DIAGNOSIS — D631 Anemia in chronic kidney disease: Secondary | ICD-10-CM | POA: Diagnosis not present

## 2018-02-25 DIAGNOSIS — N189 Chronic kidney disease, unspecified: Secondary | ICD-10-CM | POA: Diagnosis not present

## 2018-02-25 DIAGNOSIS — C61 Malignant neoplasm of prostate: Secondary | ICD-10-CM | POA: Diagnosis not present

## 2018-02-25 NOTE — Telephone Encounter (Signed)
[facility] was given a verbal refill  On 10cc prefilled saline syringe daily flush

## 2018-02-25 NOTE — Telephone Encounter (Signed)
Received a call from [facility] @ [facility] and she is asking for a order to be faxed over to [facility] on [address] for saline flushes for the patient's neph tube. She said the wife stated she is almost out and needs more prior to his app on 03-05-18 with [place].  If you have any questions you can reach her @ [place]

## 2018-02-27 ENCOUNTER — Telehealth: Payer: Self-pay | Admitting: Urology

## 2018-02-27 ENCOUNTER — Other Ambulatory Visit: Payer: Self-pay | Admitting: Oncology

## 2018-03-03 MED ORDER — MEGESTROL ACETATE 40 MG/ML PO SUSP: ORAL | 5 refills | Status: AC

## 2018-03-03 NOTE — Telephone Encounter (Signed)
Please refill the megace at the same dose and let them know

## 2018-03-05 ENCOUNTER — Encounter: Payer: Self-pay | Admitting: Diagnostic Radiology

## 2018-03-05 ENCOUNTER — Ambulatory Visit
Admission: RE | Admit: 2018-03-05 | Discharge: 2018-03-05 | Disposition: A | Discharge status: Home / Self Care | Payer: Medicare Other | Source: Ambulatory Visit | Attending: Urology | Admitting: Urology

## 2018-03-05 DIAGNOSIS — N1330 Unspecified hydronephrosis: Secondary | ICD-10-CM | POA: Insufficient documentation

## 2018-03-05 DIAGNOSIS — C61 Malignant neoplasm of prostate: Secondary | ICD-10-CM | POA: Diagnosis not present

## 2018-03-05 DIAGNOSIS — N131 Hydronephrosis with ureteral stricture, not elsewhere classified: Secondary | ICD-10-CM

## 2018-03-05 DIAGNOSIS — Z436 Encounter for attention to other artificial openings of urinary tract: Secondary | ICD-10-CM | POA: Diagnosis not present

## 2018-03-05 DIAGNOSIS — N139 Obstructive and reflux uropathy, unspecified: Secondary | ICD-10-CM | POA: Diagnosis not present

## 2018-03-05 HISTORY — PX: IR NEPHROSTOMY EXCHANGE LEFT: IMG6069

## 2018-03-05 HISTORY — PX: IR NEPHROSTOMY EXCHANGE RIGHT: IMG6070

## 2018-03-05 MED ORDER — LIDOCAINE HCL (PF) 1 % IJ SOLN
INTRAMUSCULAR | Status: AC
  Filled 2018-03-05: qty 30

## 2018-03-05 MED ORDER — IOPAMIDOL (ISOVUE-300) INJECTION 61%
50.0000 mL | Freq: Once | INTRAVENOUS | Status: AC | PRN
  Administered 2018-03-05: 10 mL via INTRAVENOUS

## 2018-03-05 MED ORDER — HEPARIN SOD (PORK) LOCK FLUSH 100 UNIT/ML IV SOLN
INTRAVENOUS | Status: AC
  Filled 2018-03-05: qty 5

## 2018-03-05 NOTE — Procedures (Signed)
Interventional Radiology Procedure:   Indications: Prostate cancer and chronic nephrostomy tubes  Procedure: Bilateral nephrostomy tube exchange  Findings: Tubes in renal pelvis  Complications: None     EBL: None  Plan: Continue gravity drainage and routine exchange in future.     Jrue Jarriel R. Anselm Pancoast, MD  Pager: [phone]

## 2018-03-07 DIAGNOSIS — N189 Chronic kidney disease, unspecified: Secondary | ICD-10-CM | POA: Diagnosis not present

## 2018-03-07 DIAGNOSIS — C61 Malignant neoplasm of prostate: Secondary | ICD-10-CM | POA: Diagnosis not present

## 2018-03-07 DIAGNOSIS — D631 Anemia in chronic kidney disease: Secondary | ICD-10-CM | POA: Diagnosis not present

## 2018-03-07 DIAGNOSIS — Z436 Encounter for attention to other artificial openings of urinary tract: Secondary | ICD-10-CM | POA: Diagnosis not present

## 2018-03-07 DIAGNOSIS — N138 Other obstructive and reflux uropathy: Secondary | ICD-10-CM | POA: Diagnosis not present

## 2018-03-07 DIAGNOSIS — F0390 Unspecified dementia without behavioral disturbance: Secondary | ICD-10-CM | POA: Diagnosis not present

## 2018-03-07 NOTE — Telephone Encounter (Signed)
I have referred him to [facility] Please send demographics and copies of my last 2 home visit notes Thanks!

## 2018-03-13 ENCOUNTER — Telehealth: Payer: Self-pay | Admitting: Internal Medicine

## 2018-03-13 DIAGNOSIS — C7951 Secondary malignant neoplasm of bone: Secondary | ICD-10-CM | POA: Diagnosis not present

## 2018-03-13 DIAGNOSIS — J302 Other seasonal allergic rhinitis: Secondary | ICD-10-CM | POA: Diagnosis not present

## 2018-03-13 DIAGNOSIS — Z8673 Personal history of transient ischemic attack (TIA), and cerebral infarction without residual deficits: Secondary | ICD-10-CM | POA: Diagnosis not present

## 2018-03-13 DIAGNOSIS — R339 Retention of urine, unspecified: Secondary | ICD-10-CM | POA: Diagnosis not present

## 2018-03-13 DIAGNOSIS — C61 Malignant neoplasm of prostate: Secondary | ICD-10-CM | POA: Diagnosis not present

## 2018-03-13 DIAGNOSIS — Z6821 Body mass index (BMI) 21.0-21.9, adult: Secondary | ICD-10-CM | POA: Diagnosis not present

## 2018-03-13 DIAGNOSIS — R627 Adult failure to thrive: Secondary | ICD-10-CM | POA: Diagnosis not present

## 2018-03-13 DIAGNOSIS — C7911 Secondary malignant neoplasm of bladder: Secondary | ICD-10-CM | POA: Diagnosis not present

## 2018-03-13 DIAGNOSIS — I5020 Unspecified systolic (congestive) heart failure: Secondary | ICD-10-CM | POA: Diagnosis not present

## 2018-03-13 DIAGNOSIS — Z436 Encounter for attention to other artificial openings of urinary tract: Secondary | ICD-10-CM | POA: Diagnosis not present

## 2018-03-13 DIAGNOSIS — R634 Abnormal weight loss: Secondary | ICD-10-CM | POA: Diagnosis not present

## 2018-03-13 DIAGNOSIS — E785 Hyperlipidemia, unspecified: Secondary | ICD-10-CM | POA: Diagnosis not present

## 2018-03-13 NOTE — Telephone Encounter (Signed)
Error not needed

## 2018-03-14 ENCOUNTER — Telehealth: Payer: Self-pay | Admitting: *Deleted

## 2018-03-14 NOTE — Telephone Encounter (Signed)
Medical records faxed to [place] [facility] [place]; RID [id number]

## 2018-03-20 DIAGNOSIS — C7951 Secondary malignant neoplasm of bone: Secondary | ICD-10-CM | POA: Diagnosis not present

## 2018-03-20 DIAGNOSIS — C61 Malignant neoplasm of prostate: Secondary | ICD-10-CM | POA: Diagnosis not present

## 2018-03-20 DIAGNOSIS — C7911 Secondary malignant neoplasm of bladder: Secondary | ICD-10-CM | POA: Diagnosis not present

## 2018-03-20 DIAGNOSIS — E785 Hyperlipidemia, unspecified: Secondary | ICD-10-CM | POA: Diagnosis not present

## 2018-03-20 DIAGNOSIS — R634 Abnormal weight loss: Secondary | ICD-10-CM | POA: Diagnosis not present

## 2018-03-20 DIAGNOSIS — I5020 Unspecified systolic (congestive) heart failure: Secondary | ICD-10-CM | POA: Diagnosis not present

## 2018-03-21 ENCOUNTER — Inpatient Hospital Stay: Payer: Medicare Other

## 2018-03-21 ENCOUNTER — Inpatient Hospital Stay: Payer: Medicare Other | Attending: Oncology

## 2018-03-25 DIAGNOSIS — C7911 Secondary malignant neoplasm of bladder: Secondary | ICD-10-CM | POA: Diagnosis not present

## 2018-03-25 DIAGNOSIS — C7951 Secondary malignant neoplasm of bone: Secondary | ICD-10-CM | POA: Diagnosis not present

## 2018-03-25 DIAGNOSIS — I5020 Unspecified systolic (congestive) heart failure: Secondary | ICD-10-CM | POA: Diagnosis not present

## 2018-03-25 DIAGNOSIS — R634 Abnormal weight loss: Secondary | ICD-10-CM | POA: Diagnosis not present

## 2018-03-25 DIAGNOSIS — E785 Hyperlipidemia, unspecified: Secondary | ICD-10-CM | POA: Diagnosis not present

## 2018-03-25 DIAGNOSIS — C61 Malignant neoplasm of prostate: Secondary | ICD-10-CM | POA: Diagnosis not present

## 2018-03-26 DIAGNOSIS — R634 Abnormal weight loss: Secondary | ICD-10-CM | POA: Diagnosis not present

## 2018-03-26 DIAGNOSIS — J302 Other seasonal allergic rhinitis: Secondary | ICD-10-CM | POA: Diagnosis not present

## 2018-03-26 DIAGNOSIS — R339 Retention of urine, unspecified: Secondary | ICD-10-CM | POA: Diagnosis not present

## 2018-03-26 DIAGNOSIS — C61 Malignant neoplasm of prostate: Secondary | ICD-10-CM | POA: Diagnosis not present

## 2018-03-26 DIAGNOSIS — C7911 Secondary malignant neoplasm of bladder: Secondary | ICD-10-CM | POA: Diagnosis not present

## 2018-03-26 DIAGNOSIS — I5020 Unspecified systolic (congestive) heart failure: Secondary | ICD-10-CM | POA: Diagnosis not present

## 2018-03-26 DIAGNOSIS — C7951 Secondary malignant neoplasm of bone: Secondary | ICD-10-CM | POA: Diagnosis not present

## 2018-03-26 DIAGNOSIS — E785 Hyperlipidemia, unspecified: Secondary | ICD-10-CM | POA: Diagnosis not present

## 2018-03-26 DIAGNOSIS — Z6821 Body mass index (BMI) 21.0-21.9, adult: Secondary | ICD-10-CM | POA: Diagnosis not present

## 2018-03-26 DIAGNOSIS — Z436 Encounter for attention to other artificial openings of urinary tract: Secondary | ICD-10-CM | POA: Diagnosis not present

## 2018-03-26 DIAGNOSIS — R627 Adult failure to thrive: Secondary | ICD-10-CM | POA: Diagnosis not present

## 2018-03-26 DIAGNOSIS — Z8673 Personal history of transient ischemic attack (TIA), and cerebral infarction without residual deficits: Secondary | ICD-10-CM | POA: Diagnosis not present

## 2018-03-27 ENCOUNTER — Inpatient Hospital Stay: Payer: Medicare Other | Attending: Oncology | Admitting: Oncology

## 2018-04-01 DIAGNOSIS — C7911 Secondary malignant neoplasm of bladder: Secondary | ICD-10-CM | POA: Diagnosis not present

## 2018-04-01 DIAGNOSIS — R634 Abnormal weight loss: Secondary | ICD-10-CM | POA: Diagnosis not present

## 2018-04-01 DIAGNOSIS — I5020 Unspecified systolic (congestive) heart failure: Secondary | ICD-10-CM | POA: Diagnosis not present

## 2018-04-01 DIAGNOSIS — E785 Hyperlipidemia, unspecified: Secondary | ICD-10-CM | POA: Diagnosis not present

## 2018-04-01 DIAGNOSIS — C7951 Secondary malignant neoplasm of bone: Secondary | ICD-10-CM | POA: Diagnosis not present

## 2018-04-01 DIAGNOSIS — C61 Malignant neoplasm of prostate: Secondary | ICD-10-CM | POA: Diagnosis not present

## 2018-04-02 ENCOUNTER — Telehealth: Payer: Self-pay | Admitting: Urology

## 2018-04-02 NOTE — Telephone Encounter (Signed)
[name], pt's hospice nurse, called with a few questions about pt's neph tubes.  She wants to know how often she should flush them and when are they scheduled to be changed again.

## 2018-04-02 NOTE — Telephone Encounter (Signed)
The tubes do not need to be flushed as long as they are draining.  They should be changed every 6-8 weeks.

## 2018-04-02 NOTE — Telephone Encounter (Signed)
Informed [place] home health nurse that nephrostomy tubes should not be flushed and should be changed every 6-8 weeks.

## 2018-04-04 ENCOUNTER — Telehealth: Payer: Self-pay | Admitting: Urology

## 2018-04-04 NOTE — Telephone Encounter (Signed)
Spoke to [place] and she is aware that he is going to  be scheduled around 04/16/17 for Neph tube change

## 2018-04-04 NOTE — Telephone Encounter (Signed)
Hospice Nurse, Hal Morales, [facility] about pt's nephrostomy tubes.  Please call her [phone]

## 2018-04-07 ENCOUNTER — Other Ambulatory Visit: Payer: Self-pay | Admitting: Radiology

## 2018-04-07 DIAGNOSIS — N131 Hydronephrosis with ureteral stricture, not elsewhere classified: Secondary | ICD-10-CM

## 2018-04-08 DIAGNOSIS — I5020 Unspecified systolic (congestive) heart failure: Secondary | ICD-10-CM | POA: Diagnosis not present

## 2018-04-08 DIAGNOSIS — E785 Hyperlipidemia, unspecified: Secondary | ICD-10-CM | POA: Diagnosis not present

## 2018-04-08 DIAGNOSIS — C7951 Secondary malignant neoplasm of bone: Secondary | ICD-10-CM | POA: Diagnosis not present

## 2018-04-08 DIAGNOSIS — R634 Abnormal weight loss: Secondary | ICD-10-CM | POA: Diagnosis not present

## 2018-04-08 DIAGNOSIS — C61 Malignant neoplasm of prostate: Secondary | ICD-10-CM | POA: Diagnosis not present

## 2018-04-08 DIAGNOSIS — C7911 Secondary malignant neoplasm of bladder: Secondary | ICD-10-CM | POA: Diagnosis not present

## 2018-04-10 DIAGNOSIS — I5020 Unspecified systolic (congestive) heart failure: Secondary | ICD-10-CM | POA: Diagnosis not present

## 2018-04-10 DIAGNOSIS — R634 Abnormal weight loss: Secondary | ICD-10-CM | POA: Diagnosis not present

## 2018-04-10 DIAGNOSIS — C61 Malignant neoplasm of prostate: Secondary | ICD-10-CM | POA: Diagnosis not present

## 2018-04-10 DIAGNOSIS — C7911 Secondary malignant neoplasm of bladder: Secondary | ICD-10-CM | POA: Diagnosis not present

## 2018-04-10 DIAGNOSIS — E785 Hyperlipidemia, unspecified: Secondary | ICD-10-CM | POA: Diagnosis not present

## 2018-04-10 DIAGNOSIS — C7951 Secondary malignant neoplasm of bone: Secondary | ICD-10-CM | POA: Diagnosis not present

## 2018-04-15 DIAGNOSIS — E785 Hyperlipidemia, unspecified: Secondary | ICD-10-CM | POA: Diagnosis not present

## 2018-04-15 DIAGNOSIS — C7911 Secondary malignant neoplasm of bladder: Secondary | ICD-10-CM | POA: Diagnosis not present

## 2018-04-15 DIAGNOSIS — C7951 Secondary malignant neoplasm of bone: Secondary | ICD-10-CM | POA: Diagnosis not present

## 2018-04-15 DIAGNOSIS — R634 Abnormal weight loss: Secondary | ICD-10-CM | POA: Diagnosis not present

## 2018-04-15 DIAGNOSIS — I5020 Unspecified systolic (congestive) heart failure: Secondary | ICD-10-CM | POA: Diagnosis not present

## 2018-04-15 DIAGNOSIS — C61 Malignant neoplasm of prostate: Secondary | ICD-10-CM | POA: Diagnosis not present

## 2018-04-16 ENCOUNTER — Ambulatory Visit
Admission: RE | Admit: 2018-04-16 | Discharge: 2018-04-16 | Disposition: A | Discharge status: Home / Self Care | Source: Ambulatory Visit | Attending: Urology | Admitting: Urology

## 2018-04-16 ENCOUNTER — Encounter: Payer: Self-pay | Admitting: Interventional Radiology

## 2018-04-16 DIAGNOSIS — Z466 Encounter for fitting and adjustment of urinary device: Secondary | ICD-10-CM | POA: Diagnosis not present

## 2018-04-16 DIAGNOSIS — Z23 Encounter for immunization: Secondary | ICD-10-CM | POA: Diagnosis not present

## 2018-04-16 DIAGNOSIS — N131 Hydronephrosis with ureteral stricture, not elsewhere classified: Secondary | ICD-10-CM | POA: Insufficient documentation

## 2018-04-16 HISTORY — PX: IR NEPHROSTOMY EXCHANGE RIGHT: IMG6070

## 2018-04-16 HISTORY — PX: IR NEPHROSTOMY EXCHANGE LEFT: IMG6069

## 2018-04-16 MED ORDER — HEPARIN SOD (PORK) LOCK FLUSH 100 UNIT/ML IV SOLN
500.0000 [IU] | Freq: Once | INTRAVENOUS | Status: AC
  Administered 2018-04-16: 500 [IU] via INTRAVENOUS
  Filled 2018-04-16: qty 5

## 2018-04-16 MED ORDER — IOPAMIDOL (ISOVUE-300) INJECTION 61%
50.0000 mL | Freq: Once | INTRAVENOUS | Status: AC | PRN
  Administered 2018-04-16: 20 mL

## 2018-04-16 MED ORDER — HEPARIN SOD (PORK) LOCK FLUSH 100 UNIT/ML IV SOLN
INTRAVENOUS | Status: AC
  Filled 2018-04-16: qty 5

## 2018-04-16 NOTE — Progress Notes (Signed)
Right Port-a-cath accessed and flushed per order.  Flushed well.  De-accessed prior to discharge home.

## 2018-04-17 ENCOUNTER — Ambulatory Visit: Payer: Medicare Other | Admitting: Internal Medicine

## 2018-04-17 ENCOUNTER — Encounter: Payer: Self-pay | Admitting: Internal Medicine

## 2018-04-17 VITALS — BP 106/78 | HR 90 | Resp 24

## 2018-04-17 DIAGNOSIS — E440 Moderate protein-calorie malnutrition: Secondary | ICD-10-CM

## 2018-04-17 DIAGNOSIS — C61 Malignant neoplasm of prostate: Secondary | ICD-10-CM

## 2018-04-17 DIAGNOSIS — I700 Atherosclerosis of aorta: Secondary | ICD-10-CM | POA: Insufficient documentation

## 2018-04-17 DIAGNOSIS — K219 Gastro-esophageal reflux disease without esophagitis: Secondary | ICD-10-CM | POA: Insufficient documentation

## 2018-04-17 DIAGNOSIS — R441 Visual hallucinations: Secondary | ICD-10-CM | POA: Diagnosis not present

## 2018-04-17 DIAGNOSIS — C7951 Secondary malignant neoplasm of bone: Secondary | ICD-10-CM | POA: Diagnosis not present

## 2018-04-17 DIAGNOSIS — I5022 Chronic systolic (congestive) heart failure: Secondary | ICD-10-CM | POA: Diagnosis not present

## 2018-04-17 DIAGNOSIS — Z936 Other artificial openings of urinary tract status: Secondary | ICD-10-CM | POA: Diagnosis not present

## 2018-04-17 MED ORDER — OMEPRAZOLE 20 MG PO CPDR: 20.0000 mg | DELAYED_RELEASE_CAPSULE | Freq: Every day | ORAL | 3 refills | Status: AC

## 2018-04-17 NOTE — Assessment & Plan Note (Addendum)
Hasn't gained weight but at least he hasn't lost Tolerating the megace

## 2018-04-17 NOTE — Assessment & Plan Note (Signed)
Persist but may be less bothersome on the quetiapine Not sure if this is adding to his sleepiness Discussed with wife---okay to try to hold AM dose to see what happens Would leave bedtime alone for now

## 2018-04-17 NOTE — Assessment & Plan Note (Signed)
Pulses in feet Not an active issue given his prognosis now

## 2018-04-17 NOTE — Assessment & Plan Note (Signed)
Frequent symptoms Will have him start daily omeprazole

## 2018-04-17 NOTE — Progress Notes (Signed)
Subjective:    Patient ID: [name], male    DOB: 1946/02/19, 73 y.o.   MRN: [id number]  HPI Home visit for review of status with metastatic prostate cancer Discussed status with Janit Pagan RN with hospice Wife is here  Just had nephrostomy tubes replaced Seem to be working okay  On hospice care for over a month This has helped wife and him so far  Aides will start next week to help with showers Severe dyspnea now when he tries this  No sig pain issues Not taking the tramadol  Still having visual hallucinations---despite the quetiapine These are quite "bizarre" he notes People and animals in house. Jet plane in the sunroom --or a boat It will "throw off your balance" Sees people in room and bed with him Hasn't been scary  No chest pain No palpitations No dizziness or syncope Very unsteady----not excited about using walker No edema--other than isolated in right foot  Current Outpatient Medications on File Prior to Visit  Medication Sig Dispense Refill  . acetaminophen (TYLENOL) 500 MG tablet Take 1,000 mg by mouth every 6 (six) hours as needed.    . loratadine (CLARITIN) 10 MG tablet Take 10 mg by mouth daily as needed for allergies.    . megestrol (MEGACE) 40 MG/ML suspension TAKE 10 MLS (400 MG TOTAL) BY MOUTH 2 (TWO) TIMES DAILY. 240 mL 5  . ondansetron (ZOFRAN) 4 MG tablet Take 1 tablet (4 mg total) by mouth every 8 (eight) hours as needed for nausea or vomiting. 30 tablet 2  . polyethylene glycol (MIRALAX / GLYCOLAX) packet Take 17 g by mouth daily as needed for mild constipation. 14 each 0  . promethazine (PHENERGAN) 25 MG tablet Take 1 tablet (25 mg total) by mouth every 6 (six) hours as needed for nausea or vomiting. 60 tablet 3  . QUEtiapine (SEROQUEL) 25 MG tablet Take 50 mg by mouth at bedtime. 50 mg at bedtime and 12.5 mg in the am     Current Facility-Administered Medications on File Prior to Visit  Medication Dose Route Frequency Provider Last Rate Last  Dose  . denosumab (XGEVA) injection 120 mg  120 mg Subcutaneous Once Wyatt Portela, MD        Allergies  Allergen Reactions  . Versed [Midazolam] Other (See Comments)    agitation    Past Medical History:  Diagnosis Date  . Diverticulosis of colon   . Hx of colonic polyps   . Prostate cancer [facility])     Past Surgical History:  Procedure Laterality Date  . CARDIOVASCULAR STRESS TEST  1996   Negative  . IR FLUORO GUIDE PORT INSERTION RIGHT  01/09/2017  . IR FLUORO RM 30-60 MIN  12/12/2017  . IR NEPHROSTOGRAM LEFT INITIAL PLACEMENT  11/23/2017  . IR NEPHROSTOMY EXCHANGE LEFT  01/31/2018  . IR NEPHROSTOMY EXCHANGE LEFT  03/05/2018  . IR NEPHROSTOMY EXCHANGE LEFT  04/16/2018  . IR NEPHROSTOMY EXCHANGE RIGHT  01/31/2018  . IR NEPHROSTOMY EXCHANGE RIGHT  03/05/2018  . IR NEPHROSTOMY EXCHANGE RIGHT  04/16/2018  . IR NEPHROSTOMY PLACEMENT RIGHT  12/18/2017  . IR US GUIDE VASC ACCESS RIGHT  01/09/2017  . PORTACATH PLACEMENT      Family History  Problem Relation Age of Onset  . Heart attack Father   . Heart disease Father   . Heart attack Brother 57  . Heart disease Brother   . Heart attack Brother   . Heart disease Brother   . Cancer Sister  breast  . Hypertension Mother   . Diabetes Sister   . Cancer Maternal Aunt   . Cancer Maternal Aunt   . Asthma Son     Social History   Socioeconomic History  . Marital status: Married    Spouse name: Not on file  . Number of children: 3  . Years of education: Not on file  . Highest education level: Not on file  Occupational History  . Occupation: cancer registry reporting  Social Needs  . Financial resource strain: Not on file  . Food insecurity:    Worry: Not on file    Inability: Not on file  . Transportation needs:    Medical: Not on file    Non-medical: Not on file  Tobacco Use  . Smoking status: Former Smoker    Packs/day: 1.00    Years: 10.00    Pack years: 10.00    Types: Cigarettes    Last attempt to quit:  03/26/1990    Years since quitting: 28.0  . Smokeless tobacco: Never Used  Substance and Sexual Activity  . Alcohol use: No    Frequency: Never  . Drug use: No  . Sexual activity: Never  Lifestyle  . Physical activity:    Days per week: Not on file    Minutes per session: Not on file  . Stress: Not on file  Relationships  . Social connections:    Talks on phone: Not on file    Gets together: Not on file    Attends religious service: Not on file    Active member of club or organization: Not on file    Attends meetings of clubs or organizations: Not on file    Relationship status: Not on file  . Intimate partner violence:    Fear of current or ex partner: Not on file    Emotionally abused: Not on file    Physically abused: Not on file    Forced sexual activity: Not on file  Other Topics Concern  . Not on file  Social History Narrative   Has living will   Wife is health care POA.   Would accept resuscitation    No tube feeds if cognitively unaware.   Review of Systems Fairly good appetite ---weight is about the same Is on megace now--the mirtazapine didn't really help No skin breakdown or ulcers Lots of heartburn recently--tums helps (but will have some night symptoms) No dysphagia Very sleepy now---a lot in the day Bowels okay No new CVA type symptoms    Objective:   Physical Exam  Constitutional: No distress.  Some wasting  Neck: No thyromegaly present.  Cardiovascular: Normal rate and regular rhythm. Exam reveals no gallop.  No murmur heard. Faint pedal pulses  Respiratory: Effort normal and breath sounds normal. No respiratory distress. He has no wheezes. He has no rales.  GI: Soft. There is no abdominal tenderness.  Musculoskeletal:     Comments: Trace edema right ankle Nephrostomy tubes in place with leg bags  Lymphadenopathy:    He has no cervical adenopathy.  Skin: No rash noted.  Psychiatric: He has a normal mood and affect. His behavior is normal.             Assessment & Plan:

## 2018-04-17 NOTE — Assessment & Plan Note (Signed)
Doesn't appear active now with the weight loss No Rx

## 2018-04-17 NOTE — Assessment & Plan Note (Signed)
On hospice now No pain issues

## 2018-04-17 NOTE — Assessment & Plan Note (Signed)
urostomies just replaced Draining well

## 2018-04-18 DIAGNOSIS — C61 Malignant neoplasm of prostate: Secondary | ICD-10-CM | POA: Diagnosis not present

## 2018-04-18 DIAGNOSIS — R634 Abnormal weight loss: Secondary | ICD-10-CM | POA: Diagnosis not present

## 2018-04-18 DIAGNOSIS — C7911 Secondary malignant neoplasm of bladder: Secondary | ICD-10-CM | POA: Diagnosis not present

## 2018-04-18 DIAGNOSIS — E785 Hyperlipidemia, unspecified: Secondary | ICD-10-CM | POA: Diagnosis not present

## 2018-04-18 DIAGNOSIS — I5020 Unspecified systolic (congestive) heart failure: Secondary | ICD-10-CM | POA: Diagnosis not present

## 2018-04-18 DIAGNOSIS — C7951 Secondary malignant neoplasm of bone: Secondary | ICD-10-CM | POA: Diagnosis not present

## 2018-04-21 DIAGNOSIS — C7911 Secondary malignant neoplasm of bladder: Secondary | ICD-10-CM | POA: Diagnosis not present

## 2018-04-21 DIAGNOSIS — C61 Malignant neoplasm of prostate: Secondary | ICD-10-CM | POA: Diagnosis not present

## 2018-04-21 DIAGNOSIS — I5020 Unspecified systolic (congestive) heart failure: Secondary | ICD-10-CM | POA: Diagnosis not present

## 2018-04-21 DIAGNOSIS — R634 Abnormal weight loss: Secondary | ICD-10-CM | POA: Diagnosis not present

## 2018-04-21 DIAGNOSIS — E785 Hyperlipidemia, unspecified: Secondary | ICD-10-CM | POA: Diagnosis not present

## 2018-04-21 DIAGNOSIS — C7951 Secondary malignant neoplasm of bone: Secondary | ICD-10-CM | POA: Diagnosis not present

## 2018-04-22 DIAGNOSIS — E785 Hyperlipidemia, unspecified: Secondary | ICD-10-CM | POA: Diagnosis not present

## 2018-04-22 DIAGNOSIS — R634 Abnormal weight loss: Secondary | ICD-10-CM | POA: Diagnosis not present

## 2018-04-22 DIAGNOSIS — I5020 Unspecified systolic (congestive) heart failure: Secondary | ICD-10-CM | POA: Diagnosis not present

## 2018-04-22 DIAGNOSIS — C61 Malignant neoplasm of prostate: Secondary | ICD-10-CM | POA: Diagnosis not present

## 2018-04-22 DIAGNOSIS — C7911 Secondary malignant neoplasm of bladder: Secondary | ICD-10-CM | POA: Diagnosis not present

## 2018-04-22 DIAGNOSIS — C7951 Secondary malignant neoplasm of bone: Secondary | ICD-10-CM | POA: Diagnosis not present

## 2018-04-23 DIAGNOSIS — C7951 Secondary malignant neoplasm of bone: Secondary | ICD-10-CM | POA: Diagnosis not present

## 2018-04-23 DIAGNOSIS — R634 Abnormal weight loss: Secondary | ICD-10-CM | POA: Diagnosis not present

## 2018-04-23 DIAGNOSIS — E785 Hyperlipidemia, unspecified: Secondary | ICD-10-CM | POA: Diagnosis not present

## 2018-04-23 DIAGNOSIS — C7911 Secondary malignant neoplasm of bladder: Secondary | ICD-10-CM | POA: Diagnosis not present

## 2018-04-23 DIAGNOSIS — C61 Malignant neoplasm of prostate: Secondary | ICD-10-CM | POA: Diagnosis not present

## 2018-04-23 DIAGNOSIS — I5020 Unspecified systolic (congestive) heart failure: Secondary | ICD-10-CM | POA: Diagnosis not present

## 2018-04-24 ENCOUNTER — Telehealth: Payer: Self-pay | Admitting: Internal Medicine

## 2018-04-24 MED ORDER — TRAMADOL HCL 50 MG PO TABS: 50.0000 mg | ORAL_TABLET | Freq: Three times a day (TID) | ORAL | 0 refills | Status: AC | PRN

## 2018-04-24 NOTE — Telephone Encounter (Signed)
PC from [facility] nurse Having more pain issues Will try tramadol and progress to hydrocodone if that is not effective Hopefully will not exacerbate hallucinations

## 2018-04-25 DIAGNOSIS — R634 Abnormal weight loss: Secondary | ICD-10-CM | POA: Diagnosis not present

## 2018-04-25 DIAGNOSIS — I5020 Unspecified systolic (congestive) heart failure: Secondary | ICD-10-CM | POA: Diagnosis not present

## 2018-04-25 DIAGNOSIS — C7911 Secondary malignant neoplasm of bladder: Secondary | ICD-10-CM | POA: Diagnosis not present

## 2018-04-25 DIAGNOSIS — E785 Hyperlipidemia, unspecified: Secondary | ICD-10-CM | POA: Diagnosis not present

## 2018-04-25 DIAGNOSIS — C61 Malignant neoplasm of prostate: Secondary | ICD-10-CM | POA: Diagnosis not present

## 2018-04-25 DIAGNOSIS — C7951 Secondary malignant neoplasm of bone: Secondary | ICD-10-CM | POA: Diagnosis not present

## 2018-05-01 ENCOUNTER — Encounter: Payer: Self-pay | Admitting: Oncology

## 2018-05-25 DEATH — deceased

## 2018-07-10 ENCOUNTER — Other Ambulatory Visit: Payer: Self-pay | Admitting: Internal Medicine

## 2018-07-31 ENCOUNTER — Ambulatory Visit: Payer: Medicare Other | Admitting: Urology

## 2018-08-27 ENCOUNTER — Encounter: Payer: Self-pay | Admitting: Family Medicine

## 2019-02-08 IMAGING — XA IR NEPHROSTOMY PLACEMENT RIGHT
4 series · 5 of 5 positions shown · non-contrast
Comparison: CT abdomen and pelvis-11/23/2017;

INDICATION: History of prostate cancer with malignant bilateral obstructive
uropathy post placement bilateral percutaneous nephrostomy catheters
on 11/23/2017 however unfortunately the right-sided percutaneous
nephrostomy catheter was inadvertently removed with failed attempted
recanalization of the percutaneous tract on 12/12/2017

[Series 1: fl - angio · 1 of 1 slices shown (1 of 4)]
[im 1/1]
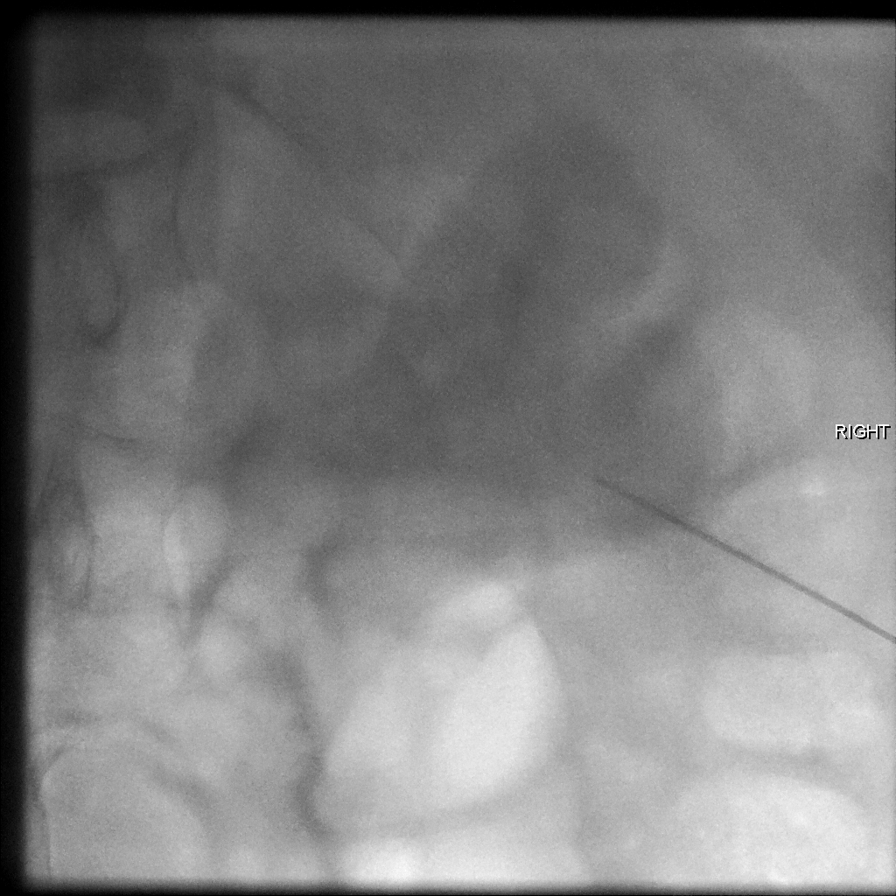

[Series 2: fl - angio · 1 of 1 slices shown (2 of 4)]
[im 1/1]
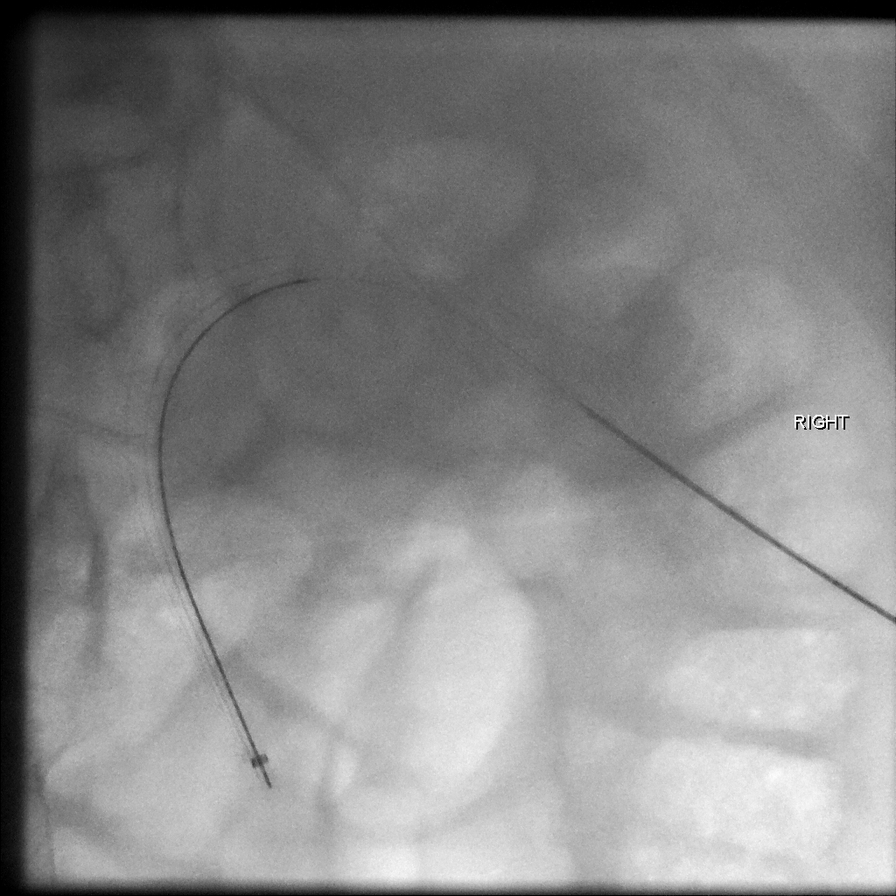

[Series 3: fl - angio · 1 of 1 slices shown (3 of 4)]
[im 1/1]
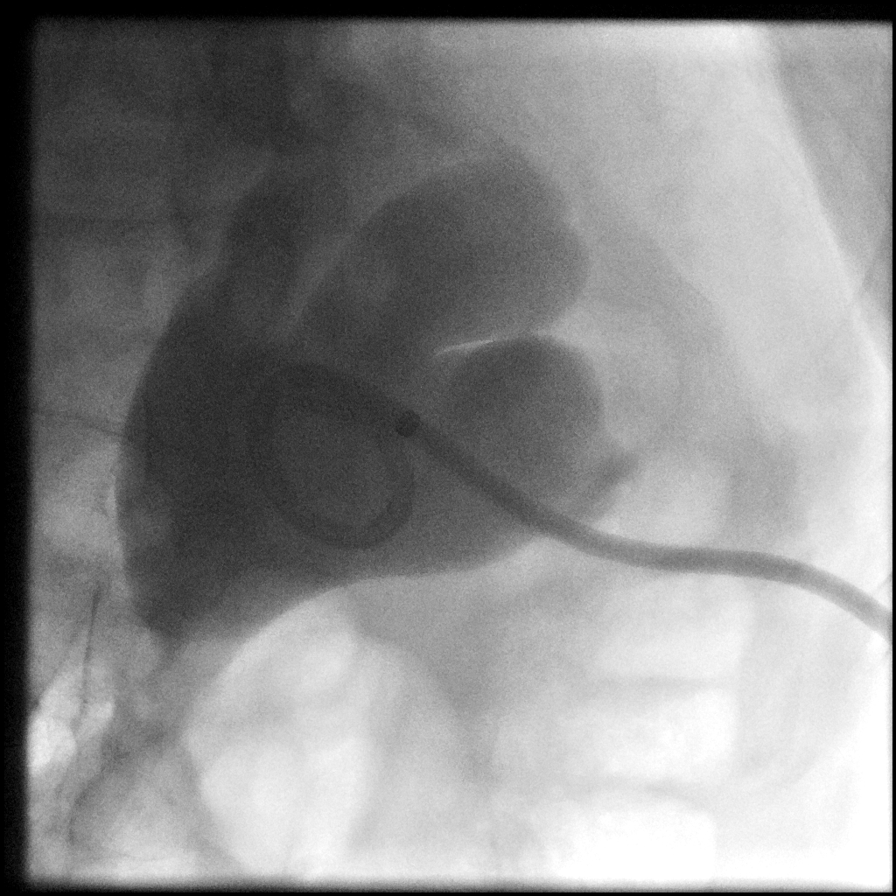

[Series 4: fl - angio · 2 of 2 slices shown (4 of 4)]
[im 1/2]
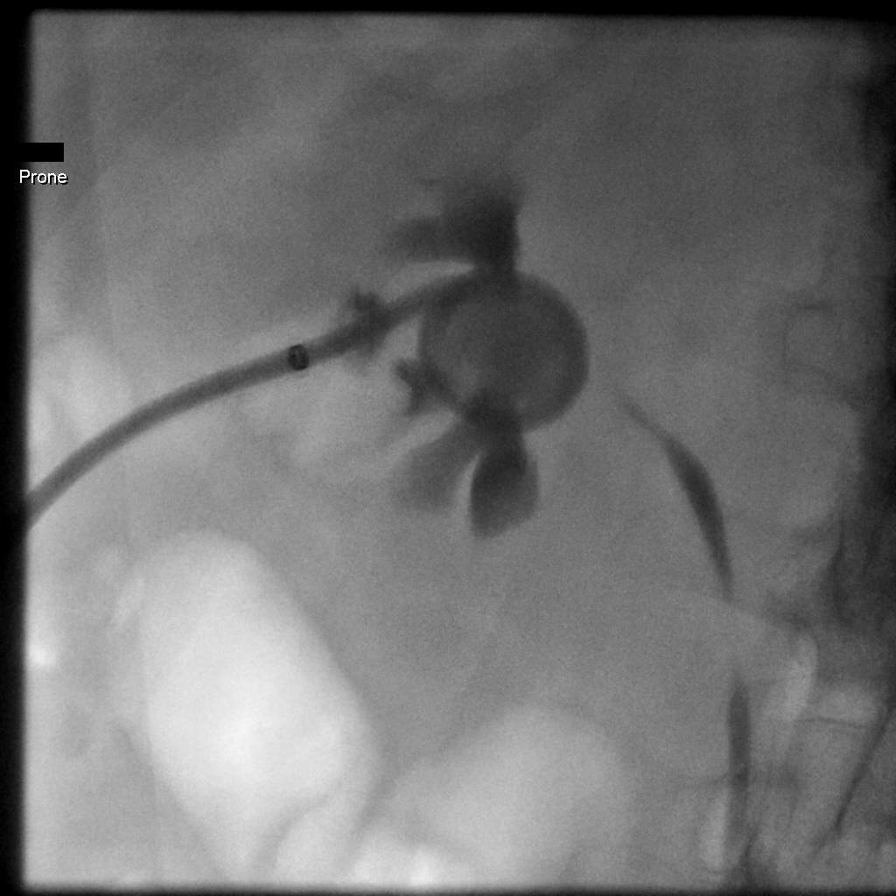
[im 2/2]
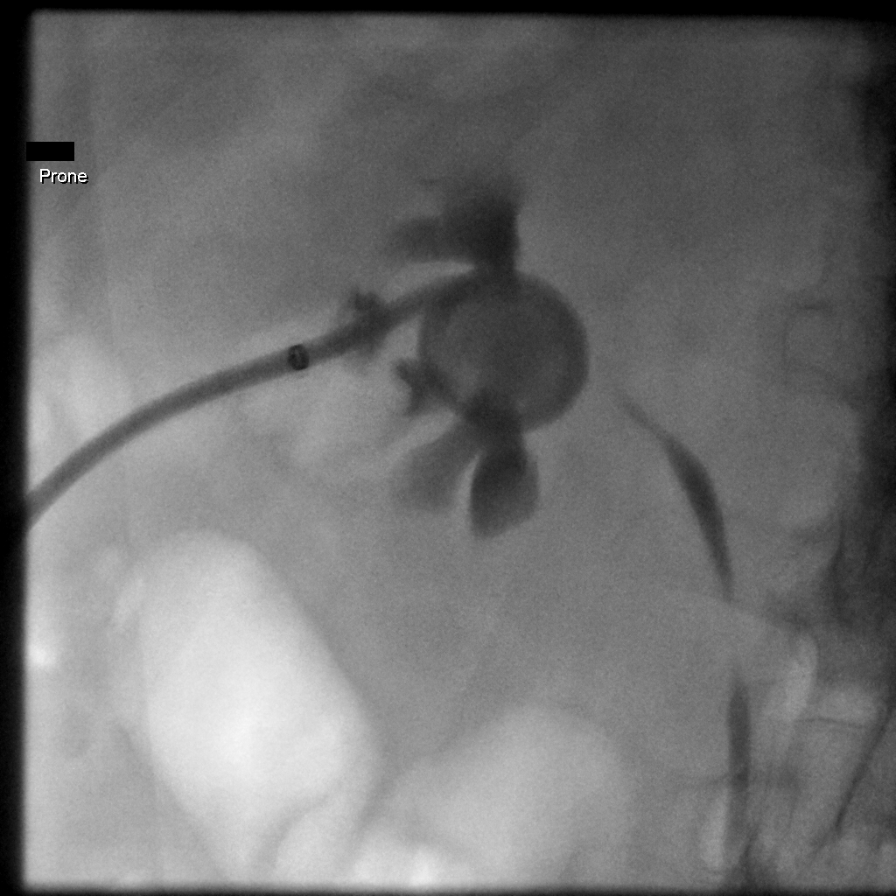

[5 of 5 positions shown; findings below may reference images not displayed]

As such, patient presents today for repeat placement of right-sided
percutaneous nephrostomy as well as left-sided antegrade
nephrostogram.

EXAM:
1. ULTRASOUND GUIDANCE FOR PUNCTURE OF THE RIGHT RENAL COLLECTING
SYSTEM
2. RIGHT PERCUTANEOUS NEPHROSTOMY TUBE PLACEMENT.
3. LEFT-SIDED ANTEGRADE NEPHROSTOGRAM VIA EXISTING NEPHROSTOMY
CATHETER
image guided
bilateral percutaneous nephrostomy catheter placement-11/23/2017;
attempted fluoroscopic guided right-sided nephrostomy catheter
rescue-12/12/2017

MEDICATIONS:
Ciprofloxacin 400 mg IV; the antibiotic was administered in an
appropriate time frame prior to skin puncture.

ANESTHESIA/SEDATION:
Moderate (conscious) sedation was employed during this procedure.
Fentanyl 50 mcg was administered intravenously.

Moderate Sedation Time: 24 minutes. The patient's level of
consciousness and vital signs were monitored continuously by
radiology nursing throughout the procedure under my direct
supervision.

CONTRAST:  A total of 20 cc [id number]-SHH was minister into the
bilateral renal collecting systems.

FLUOROSCOPY TIME:  48 seconds (19 mGy)

COMPLICATIONS:
None immediate.

PROCEDURE:
The procedure, risks, benefits, and alternatives were explained to
the patient. Questions regarding the procedure were encouraged and
answered. The patient understands and consents to the procedure. A
timeout was performed prior to the initiation of the procedure.

The right flank region was prepped with Betadine in a sterile
fashion, and a sterile drape was applied covering the operative
field. A sterile gown and sterile gloves were used for the
procedure. Local anesthesia was provided with 1% Lidocaine with
epinephrine. Ultrasound was used to localize the right kidney. Under
direct ultrasound guidance, a 21 gauge needle was advanced into the
renal collecting system. An ultrasound image documentation was
performed. Access within the collecting system was confirmed with
the efflux of urine followed by contrast injection.

Under fluoroscopic guidance, the track was dilated with an Accustick
set. Over a short Amplatz wire, the track was dilated allowing
placement of a 10 French percutaneous nephrostomy catheter with end
coiled and locked within the right renal pelvis. Contrast injection
was performed confirming appropriate positioning. Several spot
fluoroscopic images were obtained in various obliquities confirming
access.

The catheter was secured at the skin with 2 Prolene retention
sutures and a gravity bag was placed. A dressing was placed.

Contrast injection was performed of the contralateral left
nephrostomy demonstrating appropriate positioning and functionality.
The left-sided nephrostomy catheter was further secured with a new
interrupted suture.

The patient tolerated procedure well without immediate
postprocedural complication.
FINDINGS: Ultrasound scanning demonstrates a severely dilated right renal
collecting system. Under direct ultrasound guidance, a posterior
inferior calix was targeted allowing advancement of an 10-French
percutaneous nephrostomy catheter under intermittent fluoroscopic
guidance. Contrast injection confirmed appropriate positioning.

Contrast injection confirmed appropriate positioning and
functionality of the left-sided nephrostomy catheter.
IMPRESSION: 1. Successful ultrasound and fluoroscopic guided placement of a
right sided 10 French PCN.
2. Contrast injection confirms appropriate positioning and
functionality of the left-sided nephrostomy catheter.

PLAN:
Consideration for antegrade placement of bilateral ureteral stents
could be considered and 4-6 weeks as deemed appropriate by the
providing urologist.

## 2019-03-24 IMAGING — XA IR EXCHANGE NEPHROSTOMY LEFT
2 series · 2 of 2 positions shown · non-contrast
Comparison: None.

INDICATION: 72-year-old with prostate cancer, history of obstructive uropathy
and bilateral nephrostomy tubes. Patient presents for routine tube
exchange. No issues with the nephrostomy tubes. Right nephrostomy
tube drains less than the left but this is secondary to the right
renal atrophy and decreased right kidney function.

EXAM:
EXCHANGE BILATERAL NEPHROSTOMY TUBES WITH FLUOROSCOPY

[Series 1: fl - angio · 1 of 1 slices shown (1 of 2)]
[im 1/1]
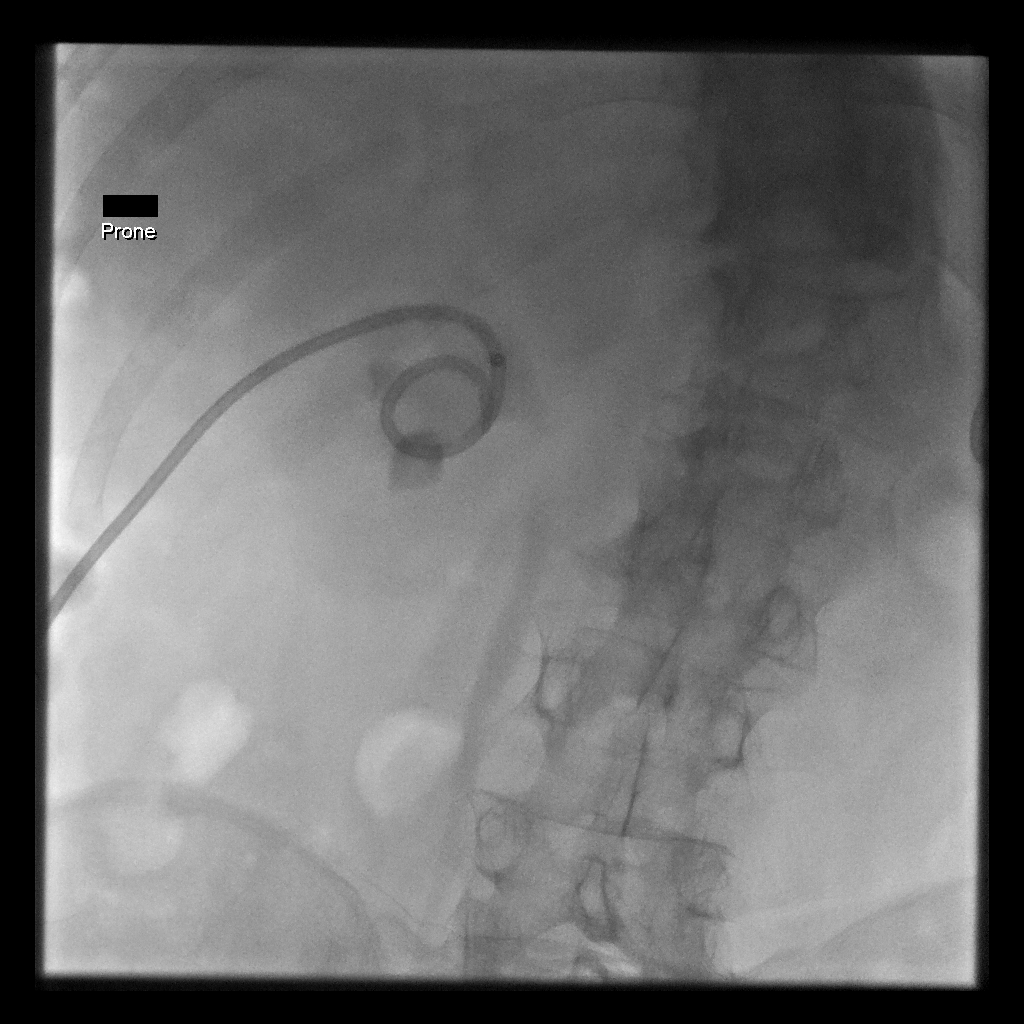

[Series 2: fl - angio · 1 of 1 slices shown (2 of 2)]
[im 1/1]
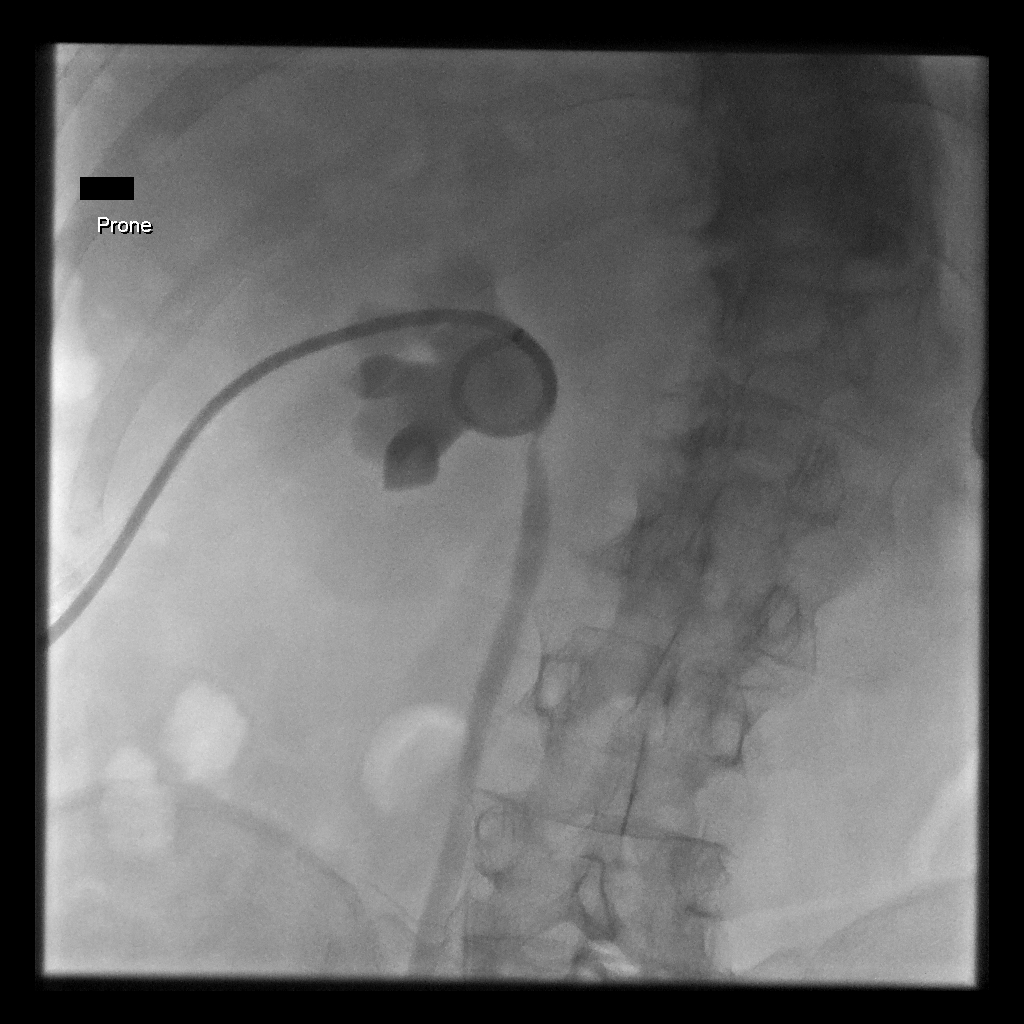

[2 of 2 positions shown; findings below may reference images not displayed]

MEDICATIONS:
None

ANESTHESIA/SEDATION:
None

CONTRAST:  10mL [id number]-6AA IOPAMIDOL (Z3YKSA-6AA) INJECTION 61% -
administered into the collecting system(s)

FLUOROSCOPY TIME:  Fluoroscopy Time: 30 seconds, 4 mGy

COMPLICATIONS:
None immediate.

PROCEDURE:
The procedure was explained to the patient. The risks and benefits
of the procedure were discussed and the patient's questions were
addressed. Informed consent was obtained from the patient. Patient
was placed prone and both flanks and tubes were prepped and draped
in sterile fashion. Maximal barrier sterile technique was utilized
including caps, mask, sterile gowns, sterile gloves, sterile drape,
hand hygiene and skin antiseptic. Retention sutures removed.

Contrast was injected through the left nephrostomy tube. The tube
was cut and removed over a Bentson wire. New 10 French tube was
advanced over the wire and positioned in the left renal pelvis.
Contrast injection confirmed placement in the left renal pelvis.
Skin was anesthetized with 1% lidocaine and the catheter was sutured
to skin.

Contrast was injected through the right nephrostomy tube. Catheter
was cut and removed over a Bentson wire. New 10 French drain was
advanced over the wire and positioned in the right renal pelvis.
Contrast injection confirmed placement in the renal pelvis. Skin was
anesthetized with 1% lidocaine and the catheter was sutured to skin.

Fluoroscopic images were taken and saved for this procedure.
IMPRESSION: Successful exchange of bilateral nephrostomy tubes with fluoroscopy.
